# Patient Record
Sex: Female | Born: 1980 | Race: White | Hispanic: No | Marital: Single | Smoking: Never smoker
Health system: Southern US, Community
[De-identification: ages and names within clinical notes are randomized; demographics above are authoritative.]

## PROBLEM LIST (undated history)

## (undated) DIAGNOSIS — E039 Hypothyroidism, unspecified: Secondary | ICD-10-CM

## (undated) DIAGNOSIS — M1990 Unspecified osteoarthritis, unspecified site: Secondary | ICD-10-CM

## (undated) DIAGNOSIS — E119 Type 2 diabetes mellitus without complications: Secondary | ICD-10-CM

## (undated) DIAGNOSIS — N200 Calculus of kidney: Secondary | ICD-10-CM

## (undated) DIAGNOSIS — K219 Gastro-esophageal reflux disease without esophagitis: Secondary | ICD-10-CM

## (undated) DIAGNOSIS — G629 Polyneuropathy, unspecified: Secondary | ICD-10-CM

## (undated) DIAGNOSIS — E785 Hyperlipidemia, unspecified: Secondary | ICD-10-CM

## (undated) DIAGNOSIS — E079 Disorder of thyroid, unspecified: Secondary | ICD-10-CM

## (undated) DIAGNOSIS — Z87442 Personal history of urinary calculi: Secondary | ICD-10-CM

## (undated) DIAGNOSIS — G5600 Carpal tunnel syndrome, unspecified upper limb: Secondary | ICD-10-CM

## (undated) HISTORY — DX: Polyneuropathy, unspecified: G62.9

## (undated) HISTORY — DX: Hyperlipidemia, unspecified: E78.5

## (undated) HISTORY — PX: OTHER SURGICAL HISTORY: SHX169

## (undated) HISTORY — PX: BACK SURGERY: SHX140

## (undated) HISTORY — DX: Carpal tunnel syndrome, unspecified upper limb: G56.00

## (undated) HISTORY — DX: Calculus of kidney: N20.0

---

## 2007-03-25 ENCOUNTER — Ambulatory Visit (HOSPITAL_COMMUNITY): Admission: RE | Admit: 2007-03-25 | Discharge: 2007-03-25 | Payer: Self-pay | Admitting: Orthopedic Surgery

## 2007-05-23 ENCOUNTER — Ambulatory Visit (HOSPITAL_COMMUNITY): Admission: RE | Admit: 2007-05-23 | Discharge: 2007-05-24 | Payer: Self-pay | Admitting: Neurosurgery

## 2009-05-03 ENCOUNTER — Emergency Department (HOSPITAL_COMMUNITY): Admission: EM | Admit: 2009-05-03 | Discharge: 2009-05-03 | Payer: Self-pay | Admitting: Emergency Medicine

## 2009-07-25 ENCOUNTER — Encounter: Admission: RE | Admit: 2009-07-25 | Discharge: 2009-07-25 | Payer: Self-pay | Admitting: Orthopedic Surgery

## 2009-08-07 ENCOUNTER — Ambulatory Visit (HOSPITAL_COMMUNITY): Admission: RE | Admit: 2009-08-07 | Discharge: 2009-08-07 | Payer: Self-pay | Admitting: Orthopedic Surgery

## 2009-08-27 ENCOUNTER — Ambulatory Visit (HOSPITAL_BASED_OUTPATIENT_CLINIC_OR_DEPARTMENT_OTHER): Admission: RE | Admit: 2009-08-27 | Discharge: 2009-08-27 | Payer: Self-pay | Admitting: Orthopedic Surgery

## 2009-10-21 ENCOUNTER — Emergency Department (HOSPITAL_COMMUNITY): Admission: EM | Admit: 2009-10-21 | Discharge: 2009-10-22 | Payer: Self-pay | Admitting: Emergency Medicine

## 2010-11-23 ENCOUNTER — Encounter: Payer: Self-pay | Admitting: Urology

## 2011-02-02 LAB — URINALYSIS, ROUTINE W REFLEX MICROSCOPIC
Bilirubin Urine: NEGATIVE
Nitrite: NEGATIVE
Specific Gravity, Urine: 1.029 (ref 1.005–1.030)
Urobilinogen, UA: 0.2 mg/dL (ref 0.0–1.0)
pH: 5.5 (ref 5.0–8.0)

## 2011-02-02 LAB — URINE CULTURE: Culture: NO GROWTH

## 2011-02-02 LAB — URINE MICROSCOPIC-ADD ON

## 2011-02-02 LAB — GLUCOSE, CAPILLARY: Glucose-Capillary: 145 mg/dL — ABNORMAL HIGH (ref 70–99)

## 2011-02-02 LAB — POCT PREGNANCY, URINE

## 2011-02-05 LAB — BASIC METABOLIC PANEL
CO2: 31 mEq/L (ref 19–32)
Calcium: 9.7 mg/dL (ref 8.4–10.5)
GFR calc non Af Amer: >60 mL/min (ref >60)
GFR calc non Af Amer: >60 mL/min (ref >60)
Glucose, Bld: 94 mg/dL (ref 70–99)
Glucose, Bld: 210 mg/dL — ABNORMAL HIGH (ref 70–99)
Potassium: 4.8 mEq/L (ref 3.5–5.1)
Potassium: 4.6 mEq/L (ref 3.5–5.1)
Sodium: 138 mEq/L (ref 135–145)
Sodium: 138 mEq/L (ref 135–145)

## 2011-02-05 LAB — POCT HEMOGLOBIN-HEMACUE
Hemoglobin: 13.5 g/dL (ref 12.0–15.0)
Hemoglobin: 13.4 g/dL (ref 12.0–15.0)

## 2011-02-05 LAB — GLUCOSE, CAPILLARY
Glucose-Capillary: 139 mg/dL — ABNORMAL HIGH (ref 70–99)
Glucose-Capillary: 160 mg/dL — ABNORMAL HIGH (ref 70–99)

## 2011-02-09 LAB — COMPREHENSIVE METABOLIC PANEL
ALT: 37 U/L — ABNORMAL HIGH (ref 0–35)
Albumin: 3.7 g/dL (ref 3.5–5.2)
Alkaline Phosphatase: 60 U/L (ref 39–117)
BUN: 4 mg/dL — ABNORMAL LOW (ref 6–23)
Chloride: 100 mEq/L (ref 96–112)
Potassium: 3.9 mEq/L (ref 3.5–5.1)
Total Bilirubin: 0.4 mg/dL (ref 0.3–1.2)

## 2011-02-09 LAB — GLUCOSE, CAPILLARY
Glucose-Capillary: 107 mg/dL — ABNORMAL HIGH (ref 70–99)
Glucose-Capillary: 133 mg/dL — ABNORMAL HIGH (ref 70–99)

## 2011-02-09 LAB — CBC
Hemoglobin: 13.8 g/dL (ref 12.0–15.0)
RBC: 4.7 MIL/uL (ref 3.87–5.11)
RDW: 13.8 % (ref 11.5–15.5)

## 2011-02-09 LAB — DIFFERENTIAL
Basophils Relative: 0 % (ref 0–1)
Eosinophils Absolute: 0.1 10*3/uL (ref 0.0–0.7)
Eosinophils Relative: 2 % (ref 0–5)
Monocytes Absolute: 0.7 10*3/uL (ref 0.1–1.0)
Monocytes Relative: 8 % (ref 3–12)
Neutrophils Relative %: 65 % (ref 43–77)

## 2011-03-17 NOTE — Op Note (Signed)
NAMELEDONNA, DORMER             ACCOUNT NO.:  [id number]   MEDICAL RECORD NO.:  [id number]          PATIENT TYPE:  OIB   LOCATION:  3010                         FACILITY:  [facility]   PHYSICIAN:  Cristi Loron, M.D.DATE OF BIRTH:  1981-09-24   DATE OF PROCEDURE:  05/23/2007  DATE OF DISCHARGE:                               OPERATIVE REPORT   BRIEF HISTORY:  The patient is a 30 year old white female who has  suffered from back and right leg pain consistent with a right S1  radiculopathy.  She has failed medical management and was worked up with  a lumbar MRI, which demonstrated the patient had a herniated nucleus  pulposus at L5-S1 on the right.  The patient's signs, symptoms and  physical exam were consistent with a right S1 radiculopathy.  I  discussed various the various treatment options with the patient and her  family.  The patient has weighed the risks, benefits and alternatives of  surgery and decided to proceed with a right L5-S1 microdiskectomy.   PREOPERATIVE DIAGNOSES:  Right L5-S1 herniated nucleus pulposus, spinal  stenosis, lumbar radiculopathy, degenerative disk disease, lumbago.   POSTOPERATIVE DIAGNOSES:  Right L5-S1 herniated nucleus pulposus, spinal  stenosis, lumbar radiculopathy, degenerative disk disease, lumbago.   PROCEDURE:  Right L5-S1 microdiskectomy using microdissection.   SURGEON:  Cristi Loron, M.D.   ASSISTANT:  Hewitt Shorts, M.D.   ANESTHESIA:  Endotracheal estimated.   ESTIMATED BLOOD LOSS:  100 mL.   SPECIMENS:  None.   DRAINS:  None.   COMPLICATIONS:  None.   DESCRIPTION OF PROCEDURE:  The patient was brought to the operating room  by the anesthesia team.  General endotracheal anesthesia was induced.  The patient was then turned to the prone position on the Wilson frame.  Her lumbosacral region was then prepared with Betadine scrub and  Betadine solution and sterile drapes were applied.  I then injected the  area to be  incised with Marcaine with epinephrine solution and used a  scalpel to make a linear midline incision over the L5-S1 interspace.  I  used electrocautery to perform a right-sided subperiosteal dissection  exposing the right spinous process and lamina of L5 and the upper  sacrum.  We obtained intraoperative radiograph to confirm our location.   We then inserted the Versatrac retractor for exposure and then brought  the operative microscope into the field and under its magnification and  illumination completed the microdissection/decompression.  I used a high-  speed drill to perform a right L5 laminotomy.  I widened the laminotomy  with a Kerrison punch, removing the right L5-S1 ligamentum flavum.  I  then performed a foraminotomy about the right S1 nerve root.  We then  used microdissection to free up the thecal sac and the right S1 nerve  root from the epidural tissue and Dr. Newell Coral then gently retracted the  thecal sac and the S1 nerve root medially with the D'Errico retractor.  This exposed a large underlying herniated disk, which we removed in  multiple fragments using a pituitary forceps.  We then inspected the  intervertebral disk.  There was quite a bit of bulging at the disk space  and herniating through the annulus fibrosus.  We therefore incised the  annulus fibrosus with a 15-blade scalpel and performed a partial  intervertebral diskectomy using the pituitary forceps and the Epstein  and Scoville curettes.  After we were satisfied with the diskectomy, we  used the osteophyte tool to remove some spondylosis from the vertebral  endplates at L5-S1 to further decompress the nerve root.  We then  palpated along the ventral surface of the thecal sac and along the exit  route of the right S1 nerve root and noted the neural structure were  well-decompressed.  We obtained hemostasis using bipolar cautery.  We  irrigated the wound out with bacitracin solution and then removed the   Versatrac retractor and then reapproximated the patient's thoracolumbar  fascia with interrupted #1 Vicryl suture, the subcutaneous tissue with  interrupted 2-0 Vicryl suture and the skin with Steri-Strips and  Benzoin.  The wound was then coated with bacitracin ointment and a  sterile dressing applied.  The drapes were removed.  The patient was  subsequently returned to the supine position, where she was extubated by  the anesthesia team and transported to the post anesthesia care unit in  stable condition.  All sponge, instrument and needle counts were correct  at the end of this case.      Cristi Loron, M.D.  Electronically Signed     JDJ/MEDQ  D:  05/23/2007  T:  05/24/2007  Job:  [id number]

## 2011-08-17 LAB — HCG, SERUM, QUALITATIVE: Preg, Serum: NEGATIVE

## 2011-08-17 LAB — BASIC METABOLIC PANEL
BUN: 7
Calcium: 9.4
GFR calc non Af Amer: >60
Potassium: 4.3

## 2011-08-17 LAB — CBC
HCT: 41.5
Platelets: 292
WBC: 11.8 — ABNORMAL HIGH

## 2013-01-27 ENCOUNTER — Encounter (HOSPITAL_COMMUNITY): Payer: Self-pay | Admitting: *Deleted

## 2013-01-27 ENCOUNTER — Emergency Department (HOSPITAL_COMMUNITY)
Admission: EM | Admit: 2013-01-27 | Discharge: 2013-01-27 | Disposition: A | Discharge status: Home / Self Care | Payer: Medicaid Other | Attending: Emergency Medicine | Admitting: Emergency Medicine

## 2013-01-27 DIAGNOSIS — E079 Disorder of thyroid, unspecified: Secondary | ICD-10-CM | POA: Insufficient documentation

## 2013-01-27 DIAGNOSIS — I498 Other specified cardiac arrhythmias: Secondary | ICD-10-CM | POA: Insufficient documentation

## 2013-01-27 DIAGNOSIS — J029 Acute pharyngitis, unspecified: Secondary | ICD-10-CM | POA: Insufficient documentation

## 2013-01-27 DIAGNOSIS — IMO0002 Reserved for concepts with insufficient information to code with codable children: Secondary | ICD-10-CM | POA: Insufficient documentation

## 2013-01-27 DIAGNOSIS — Z79899 Other long term (current) drug therapy: Secondary | ICD-10-CM | POA: Insufficient documentation

## 2013-01-27 DIAGNOSIS — R0602 Shortness of breath: Secondary | ICD-10-CM | POA: Insufficient documentation

## 2013-01-27 DIAGNOSIS — R220 Localized swelling, mass and lump, head: Secondary | ICD-10-CM | POA: Insufficient documentation

## 2013-01-27 DIAGNOSIS — E119 Type 2 diabetes mellitus without complications: Secondary | ICD-10-CM | POA: Insufficient documentation

## 2013-01-27 DIAGNOSIS — L272 Dermatitis due to ingested food: Secondary | ICD-10-CM | POA: Insufficient documentation

## 2013-01-27 HISTORY — DX: Type 2 diabetes mellitus without complications: E11.9

## 2013-01-27 HISTORY — DX: Disorder of thyroid, unspecified: E07.9

## 2013-01-27 MED ORDER — PREDNISONE 10 MG PO TABS: 20.0000 mg | ORAL_TABLET | Freq: Every day | ORAL | Status: DC

## 2013-01-27 MED ORDER — DIPHENHYDRAMINE HCL 25 MG PO TABS: 25.0000 mg | ORAL_TABLET | Freq: Four times a day (QID) | ORAL | Status: DC

## 2013-01-27 MED ORDER — METHYLPREDNISOLONE SODIUM SUCC 125 MG IJ SOLR
125.0000 mg | Freq: Once | INTRAMUSCULAR | Status: AC
  Administered 2013-01-27: 125 mg via INTRAVENOUS
  Filled 2013-01-27: qty 2

## 2013-01-27 MED ORDER — FAMOTIDINE IN NACL 20-0.9 MG/50ML-% IV SOLN
20.0000 mg | Freq: Once | INTRAVENOUS | Status: AC
  Administered 2013-01-27: 20 mg via INTRAVENOUS
  Filled 2013-01-27: qty 50

## 2013-01-27 MED ORDER — RANITIDINE HCL 150 MG PO TABS: 150.0000 mg | ORAL_TABLET | Freq: Two times a day (BID) | ORAL | Status: DC

## 2013-01-27 MED ORDER — DIPHENHYDRAMINE HCL 50 MG/ML IJ SOLN
25.0000 mg | Freq: Once | INTRAMUSCULAR | Status: AC
  Administered 2013-01-27: 25 mg via INTRAVENOUS
  Filled 2013-01-27: qty 1

## 2013-01-27 MED ORDER — EPINEPHRINE 0.3 MG/0.3ML IJ DEVI
0.3000 mg | Freq: Once | INTRAMUSCULAR | Status: AC
  Administered 2013-01-27: 0.3 mg via INTRAMUSCULAR
  Filled 2013-01-27: qty 0.3

## 2013-01-27 NOTE — ED Notes (Signed)
Hope NP in room assessing pt at this time

## 2013-01-27 NOTE — ED Notes (Addendum)
Pt presents with rash to arms, neck and upper torso starting yesterday. Pt denies SOB but states it is hard to take a deep breath, lung sounds clear at this time, airway patent, pt speaking clearly. NAD

## 2013-01-27 NOTE — ED Notes (Addendum)
Itching rash to face and trunk,  Onset to day.  No new meds Has felt sl sob.  Has white spots in mouth and throat.

## 2013-01-27 NOTE — ED Provider Notes (Signed)
History     CSN: [id number]  Arrival date & time 01/27/13  2038   First MD Initiated Contact with Patient 01/27/13 2143      Chief Complaint  Patient presents with  . Rash    (Consider location/radiation/quality/duration/timing/severity/associated sxs/prior treatment) Patient is a 32 y.o. female presenting with rash. The history is provided by the patient and a parent. History limited by: patient with mild Down's syndrome.  Rash Location:  Full body Severity:  Moderate Onset quality:  Gradual Duration:  8 hours Timing:  Constant Progression:  Worsening Chronicity:  New Context: food   Relieved by:  Nothing Associated symptoms: shortness of breath and sore throat   Associated symptoms: no abdominal pain, no fever, no headaches, no nausea and not vomiting    Patient ate strawberries and then began to notice a rash and felt like her lips were swelling. Also noted redness of throat and bumps on throat. Began feeling like it was hard to get a deep breath.   Past Medical History  Diagnosis Date  . Diabetes mellitus without complication   . Thyroid disease     Past Surgical History  Procedure Laterality Date  . Tubes in ears    . Back surgery      History reviewed. No pertinent family history.  History  Substance Use Topics  . Smoking status: Never Smoker   . Smokeless tobacco: Not on file  . Alcohol Use: No    OB History   Grav Para Term Preterm Abortions TAB SAB Ect Mult Living                  Review of Systems  Constitutional: Negative for fever and chills.  HENT: Positive for sore throat and facial swelling. Negative for trouble swallowing, neck pain and voice change.   Respiratory: Positive for shortness of breath.   Gastrointestinal: Negative for nausea, vomiting and abdominal pain.  Skin: Positive for rash.  Neurological: Negative for headaches.  Psychiatric/Behavioral: The patient is not nervous/anxious.     Allergies  Penicillins  Home  Medications   Current Outpatient Rx  Name  Route  Sig  Dispense  Refill  . acetaminophen (TYLENOL) 500 MG tablet   Oral   Take 500 mg by mouth daily as needed for pain.         . fluticasone (FLONASE) 50 MCG/ACT nasal spray   Nasal   Place 2 sprays into the nose daily.         Marland Kitchen glimepiride (AMARYL) 2 MG tablet   Oral   Take 2 mg by mouth 2 (two) times daily.         Marland Kitchen levothyroxine (SYNTHROID, LEVOTHROID) 137 MCG tablet   Oral   Take 137 mcg by mouth daily.           BP 134/68  Pulse 142  Temp(Src) 101.4 F (38.6 C) (Oral)  Wt 328 lb (148.78 kg)  SpO2 92%  LMP 01/27/2013  Physical Exam  Nursing note and vitals reviewed. Constitutional: No distress.  HENT:  Mouth/Throat: Uvula is midline. Posterior oropharyngeal erythema present.  There are small lesions noted on the posterior pharynx. Minimal edema of throat and lips.  No difficulty swallowing or talking.  Eyes: EOM are normal.  Neck: Neck supple.  Cardiovascular: Bradycardia present.   Pulmonary/Chest: No respiratory distress. She has no wheezes. She has no rales.  Slightly decreased breath sounds lung bases.  Musculoskeletal:  See skin exam  Skin: Rash noted.  There are  raised red areas noted on the face, neck, arms, trunk and legs. Patient c/o itching  Psychiatric: She has a normal mood and affect.   Assessment: 32 y.o. female with allergic reaction  Plan:  Solumedrol 125 mg, Benadryl 25 mg, Pepcid 20 mg IV   Observe   ED Course: re evaluation @ 23:15 and Dr. Ignacia Palma in to evaluate as well Patient feeling better, lungs clear, lips without edema or erythema. Throat without edema. Rash improved, no itching, no difficulty breathing.   Procedures (including critical care time)   MDM  I have reviewed this patient's vital signs, nurses notes, will recheck vital signs to be sure heart rate is normal. Discussed with patient and family plan of care. They voice understanding.  Patient discharged home with  Rx for Zantac, Benadryl and Prednisone    Medication List    TAKE these medications       diphenhydrAMINE 25 MG tablet  Commonly known as:  BENADRYL  Take 1 tablet (25 mg total) by mouth every 6 (six) hours.     predniSONE 10 MG tablet  Commonly known as:  DELTASONE  Take 2 tablets (20 mg total) by mouth daily.     ranitidine 150 MG tablet  Commonly known as:  ZANTAC  Take 1 tablet (150 mg total) by mouth 2 (two) times daily.      ASK your doctor about these medications       acetaminophen 500 MG tablet  Commonly known as:  TYLENOL  Take 500 mg by mouth daily as needed for pain.     fluticasone 50 MCG/ACT nasal spray  Commonly known as:  FLONASE  Place 2 sprays into the nose daily.     glimepiride 2 MG tablet  Commonly known as:  AMARYL  Take 2 mg by mouth 2 (two) times daily.     levothyroxine 137 MCG tablet  Commonly known as:  SYNTHROID, LEVOTHROID  Take 137 mcg by mouth daily.               Janne Napoleon, [place] 01/27/13 [phone]

## 2013-01-28 NOTE — ED Provider Notes (Signed)
Medical screening examination/treatment/procedure(s) were conducted as a shared visit with non-physician practitioner(s) and myself.  I personally evaluated the patient during the encounter Ate strawberry pop tart and developed urticarial rash.  Given Epinephrine, Solumedrol, Pepcid, and Benadryl, with resolution.  Advised she is allergic to strawberries.  Carleene Cooper III, MD 01/28/13 1126

## 2013-10-02 ENCOUNTER — Inpatient Hospital Stay (HOSPITAL_COMMUNITY)
Admission: EM | Admit: 2013-10-02 | Discharge: 2013-10-09 | DRG: 480 | Disposition: A | Discharge status: Rehabilitation Facility | Payer: Medicaid Other | Attending: Internal Medicine | Admitting: Internal Medicine

## 2013-10-02 DIAGNOSIS — Y998 Other external cause status: Secondary | ICD-10-CM

## 2013-10-02 DIAGNOSIS — R339 Retention of urine, unspecified: Secondary | ICD-10-CM | POA: Diagnosis present

## 2013-10-02 DIAGNOSIS — E662 Morbid (severe) obesity with alveolar hypoventilation: Secondary | ICD-10-CM | POA: Diagnosis present

## 2013-10-02 DIAGNOSIS — I498 Other specified cardiac arrhythmias: Secondary | ICD-10-CM | POA: Diagnosis not present

## 2013-10-02 DIAGNOSIS — E872 Acidosis, unspecified: Secondary | ICD-10-CM | POA: Diagnosis not present

## 2013-10-02 DIAGNOSIS — S7292XA Unspecified fracture of left femur, initial encounter for closed fracture: Secondary | ICD-10-CM

## 2013-10-02 DIAGNOSIS — Z6841 Body Mass Index (BMI) 40.0 and over, adult: Secondary | ICD-10-CM

## 2013-10-02 DIAGNOSIS — R0902 Hypoxemia: Secondary | ICD-10-CM

## 2013-10-02 DIAGNOSIS — Y92009 Unspecified place in unspecified non-institutional (private) residence as the place of occurrence of the external cause: Secondary | ICD-10-CM

## 2013-10-02 DIAGNOSIS — N179 Acute kidney failure, unspecified: Secondary | ICD-10-CM | POA: Diagnosis not present

## 2013-10-02 DIAGNOSIS — S72332A Displaced oblique fracture of shaft of left femur, initial encounter for closed fracture: Secondary | ICD-10-CM

## 2013-10-02 DIAGNOSIS — S72309A Unspecified fracture of shaft of unspecified femur, initial encounter for closed fracture: Principal | ICD-10-CM | POA: Diagnosis present

## 2013-10-02 DIAGNOSIS — E871 Hypo-osmolality and hyponatremia: Secondary | ICD-10-CM | POA: Diagnosis not present

## 2013-10-02 DIAGNOSIS — G4733 Obstructive sleep apnea (adult) (pediatric): Secondary | ICD-10-CM | POA: Diagnosis present

## 2013-10-02 DIAGNOSIS — R000 Tachycardia, unspecified: Secondary | ICD-10-CM

## 2013-10-02 DIAGNOSIS — W19XXXA Unspecified fall, initial encounter: Secondary | ICD-10-CM | POA: Diagnosis present

## 2013-10-02 DIAGNOSIS — E039 Hypothyroidism, unspecified: Secondary | ICD-10-CM | POA: Diagnosis present

## 2013-10-02 DIAGNOSIS — R338 Other retention of urine: Secondary | ICD-10-CM

## 2013-10-02 DIAGNOSIS — K219 Gastro-esophageal reflux disease without esophagitis: Secondary | ICD-10-CM | POA: Diagnosis present

## 2013-10-02 DIAGNOSIS — J9620 Acute and chronic respiratory failure, unspecified whether with hypoxia or hypercapnia: Secondary | ICD-10-CM | POA: Insufficient documentation

## 2013-10-02 DIAGNOSIS — E119 Type 2 diabetes mellitus without complications: Secondary | ICD-10-CM | POA: Diagnosis present

## 2013-10-02 HISTORY — DX: Gastro-esophageal reflux disease without esophagitis: K21.9

## 2013-10-03 ENCOUNTER — Inpatient Hospital Stay (HOSPITAL_COMMUNITY): Payer: Medicaid Other

## 2013-10-03 ENCOUNTER — Encounter (HOSPITAL_COMMUNITY): Payer: Medicaid Other | Admitting: Anesthesiology

## 2013-10-03 ENCOUNTER — Emergency Department (HOSPITAL_COMMUNITY): Payer: Medicaid Other

## 2013-10-03 ENCOUNTER — Inpatient Hospital Stay (HOSPITAL_COMMUNITY): Payer: Medicaid Other | Admitting: Anesthesiology

## 2013-10-03 ENCOUNTER — Encounter (HOSPITAL_COMMUNITY): Payer: Self-pay | Admitting: Emergency Medicine

## 2013-10-03 ENCOUNTER — Encounter (HOSPITAL_COMMUNITY)
Admission: EM | Disposition: A | Discharge status: Rehabilitation Facility | Payer: Self-pay | Source: Home / Self Care | Attending: Internal Medicine

## 2013-10-03 DIAGNOSIS — R0902 Hypoxemia: Secondary | ICD-10-CM

## 2013-10-03 DIAGNOSIS — E039 Hypothyroidism, unspecified: Secondary | ICD-10-CM | POA: Diagnosis present

## 2013-10-03 DIAGNOSIS — S7290XA Unspecified fracture of unspecified femur, initial encounter for closed fracture: Secondary | ICD-10-CM | POA: Insufficient documentation

## 2013-10-03 DIAGNOSIS — E119 Type 2 diabetes mellitus without complications: Secondary | ICD-10-CM | POA: Diagnosis present

## 2013-10-03 DIAGNOSIS — S72332A Displaced oblique fracture of shaft of left femur, initial encounter for closed fracture: Secondary | ICD-10-CM

## 2013-10-03 DIAGNOSIS — R000 Tachycardia, unspecified: Secondary | ICD-10-CM

## 2013-10-03 HISTORY — PX: FEMUR IM NAIL: SHX1597

## 2013-10-03 LAB — COMPREHENSIVE METABOLIC PANEL
ALT: 38 U/L — ABNORMAL HIGH (ref 0–35)
Alkaline Phosphatase: 86 U/L (ref 39–117)
BUN: 13 mg/dL (ref 6–23)
Chloride: 96 mEq/L (ref 96–112)
GFR calc Af Amer: >90 mL/min (ref >90)
Glucose, Bld: 353 mg/dL — ABNORMAL HIGH (ref 70–99)
Potassium: 4.7 mEq/L (ref 3.5–5.1)
Sodium: 137 mEq/L (ref 135–145)
Total Bilirubin: 0.4 mg/dL (ref 0.3–1.2)

## 2013-10-03 LAB — CBC WITH DIFFERENTIAL/PLATELET
Hemoglobin: 14.3 g/dL (ref 12.0–15.0)
Lymphocytes Relative: 10 % — ABNORMAL LOW (ref 12–46)
Lymphs Abs: 1.7 10*3/uL (ref 0.7–4.0)
MCH: 30.6 pg (ref 26.0–34.0)
Monocytes Relative: 4 % (ref 3–12)
Neutro Abs: 14 10*3/uL — ABNORMAL HIGH (ref 1.7–7.7)
Neutrophils Relative %: 85 % — ABNORMAL HIGH (ref 43–77)
RBC: 4.67 MIL/uL (ref 3.87–5.11)
WBC: 16.5 10*3/uL — ABNORMAL HIGH (ref 4.0–10.5)

## 2013-10-03 LAB — URINALYSIS, ROUTINE W REFLEX MICROSCOPIC
Bilirubin Urine: NEGATIVE
Glucose, UA: >1000 mg/dL — AB
Ketones, ur: 15 mg/dL — AB
Specific Gravity, Urine: 1.01 (ref 1.005–1.030)
pH: 6 (ref 5.0–8.0)

## 2013-10-03 LAB — POCT I-STAT 3, VENOUS BLOOD GAS (G3P V)
Acid-Base Excess: 3 mmol/L — ABNORMAL HIGH (ref 0.0–2.0)
Bicarbonate: 29.5 mEq/L — ABNORMAL HIGH (ref 20.0–24.0)
Patient temperature: 100.2
TCO2: 31 mmol/L (ref 0–100)
pCO2, Ven: 53.1 mmHg — ABNORMAL HIGH (ref 45.0–50.0)

## 2013-10-03 LAB — ABO/RH: ABO/RH(D): O POS

## 2013-10-03 LAB — TYPE AND SCREEN
ABO/RH(D): O POS
Antibody Screen: NEGATIVE

## 2013-10-03 LAB — GLUCOSE, CAPILLARY
Glucose-Capillary: 298 mg/dL — ABNORMAL HIGH (ref 70–99)
Glucose-Capillary: 181 mg/dL — ABNORMAL HIGH (ref 70–99)

## 2013-10-03 LAB — URINE MICROSCOPIC-ADD ON

## 2013-10-03 LAB — PRO B NATRIURETIC PEPTIDE: Pro B Natriuretic peptide (BNP): 17.7 pg/mL (ref 0–125)

## 2013-10-03 SURGERY — INSERTION, INTRAMEDULLARY ROD, FEMUR, RETROGRADE
Anesthesia: General | Site: Leg Upper | Laterality: Left

## 2013-10-03 MED ORDER — METOCLOPRAMIDE HCL 5 MG PO TABS: 5.0000 mg | ORAL_TABLET | Freq: Three times a day (TID) | ORAL | Status: DC | PRN

## 2013-10-03 MED ORDER — HYDROMORPHONE HCL PF 1 MG/ML IJ SOLN: 1.0000 mg | INTRAMUSCULAR | Status: DC | PRN

## 2013-10-03 MED ORDER — HYDROMORPHONE HCL PF 1 MG/ML IJ SOLN
1.0000 mg | Freq: Once | INTRAMUSCULAR | Status: AC | PRN
  Administered 2013-10-03: 1 mg via INTRAVENOUS
  Filled 2013-10-03: qty 1

## 2013-10-03 MED ORDER — HYDROCODONE-ACETAMINOPHEN 5-325 MG PO TABS: 1.0000 | ORAL_TABLET | Freq: Four times a day (QID) | ORAL | Status: DC | PRN

## 2013-10-03 MED ORDER — METHOCARBAMOL 500 MG PO TABS
ORAL_TABLET | ORAL | Status: AC
  Filled 2013-10-03: qty 1

## 2013-10-03 MED ORDER — ONDANSETRON HCL 4 MG/2ML IJ SOLN
4.0000 mg | Freq: Four times a day (QID) | INTRAMUSCULAR | Status: DC | PRN
  Administered 2013-10-03 – 2013-10-06 (×2): 4 mg via INTRAVENOUS
  Filled 2013-10-03 (×2): qty 2

## 2013-10-03 MED ORDER — IOHEXOL 350 MG/ML SOLN
100.0000 mL | Freq: Once | INTRAVENOUS | Status: AC | PRN
  Administered 2013-10-03: 80 mL via INTRAVENOUS

## 2013-10-03 MED ORDER — CIPROFLOXACIN HCL 500 MG PO TABS
500.0000 mg | ORAL_TABLET | Freq: Once | ORAL | Status: AC
  Administered 2013-10-03: 500 mg via ORAL
  Filled 2013-10-03: qty 1

## 2013-10-03 MED ORDER — OXYCODONE HCL 5 MG/5ML PO SOLN: 5.0000 mg | Freq: Once | ORAL | Status: AC | PRN

## 2013-10-03 MED ORDER — POLYETHYLENE GLYCOL 3350 17 G PO PACK: 17.0000 g | PACK | Freq: Every day | ORAL | Status: DC | PRN

## 2013-10-03 MED ORDER — ONDANSETRON HCL 4 MG/2ML IJ SOLN
INTRAMUSCULAR | Status: DC | PRN
  Administered 2013-10-03: 4 mg via INTRAVENOUS

## 2013-10-03 MED ORDER — MENTHOL 3 MG MT LOZG: 1.0000 | LOZENGE | OROMUCOSAL | Status: DC | PRN

## 2013-10-03 MED ORDER — FENTANYL CITRATE 0.05 MG/ML IJ SOLN
INTRAMUSCULAR | Status: AC
  Filled 2013-10-03: qty 2

## 2013-10-03 MED ORDER — GLYCOPYRROLATE 0.2 MG/ML IJ SOLN
INTRAMUSCULAR | Status: DC | PRN
  Administered 2013-10-03: 0.6 mg via INTRAVENOUS

## 2013-10-03 MED ORDER — METHOCARBAMOL 100 MG/ML IJ SOLN
500.0000 mg | Freq: Four times a day (QID) | INTRAVENOUS | Status: DC | PRN
  Administered 2013-10-03: 500 mg via INTRAVENOUS
  Filled 2013-10-03 (×2): qty 5

## 2013-10-03 MED ORDER — SODIUM CHLORIDE 0.9 % IV SOLN
INTRAVENOUS | Status: DC
  Administered 2013-10-03: 08:00:00 via INTRAVENOUS

## 2013-10-03 MED ORDER — SODIUM CHLORIDE 0.45 % IV SOLN
INTRAVENOUS | Status: DC
  Administered 2013-10-03 – 2013-10-05 (×3): via INTRAVENOUS

## 2013-10-03 MED ORDER — FENTANYL CITRATE 0.05 MG/ML IJ SOLN
INTRAMUSCULAR | Status: DC | PRN
  Administered 2013-10-03 (×11): 50 ug via INTRAVENOUS

## 2013-10-03 MED ORDER — BUPIVACAINE HCL (PF) 0.25 % IJ SOLN
INTRAMUSCULAR | Status: AC
  Filled 2013-10-03: qty 30

## 2013-10-03 MED ORDER — DEXTROSE 5 % IV SOLN
3.0000 g | INTRAVENOUS | Status: DC | PRN
  Administered 2013-10-03: 3 g via INTRAVENOUS

## 2013-10-03 MED ORDER — FENTANYL CITRATE 0.05 MG/ML IJ SOLN
25.0000 ug | INTRAMUSCULAR | Status: DC | PRN
  Administered 2013-10-03 (×2): 50 ug via INTRAVENOUS
  Administered 2013-10-03 (×2): 25 ug via INTRAVENOUS

## 2013-10-03 MED ORDER — 0.9 % SODIUM CHLORIDE (POUR BTL) OPTIME
TOPICAL | Status: DC | PRN
  Administered 2013-10-03: 1000 mL

## 2013-10-03 MED ORDER — METOCLOPRAMIDE HCL 5 MG/ML IJ SOLN
5.0000 mg | Freq: Three times a day (TID) | INTRAMUSCULAR | Status: DC | PRN
  Administered 2013-10-05: 5 mg via INTRAVENOUS
  Filled 2013-10-03: qty 2

## 2013-10-03 MED ORDER — OXYCODONE HCL 5 MG PO TABS
5.0000 mg | ORAL_TABLET | ORAL | Status: DC | PRN
  Administered 2013-10-04 – 2013-10-05 (×8): 10 mg via ORAL
  Filled 2013-10-03 (×8): qty 2

## 2013-10-03 MED ORDER — INFLUENZA VAC SPLIT QUAD 0.5 ML IM SUSP: 0.5000 mL | INTRAMUSCULAR | Status: DC

## 2013-10-03 MED ORDER — INSULIN ASPART 100 UNIT/ML ~~LOC~~ SOLN
SUBCUTANEOUS | Status: AC
  Filled 2013-10-03: qty 5

## 2013-10-03 MED ORDER — HYDROMORPHONE HCL PF 1 MG/ML IJ SOLN: 0.5000 mg | INTRAMUSCULAR | Status: DC | PRN

## 2013-10-03 MED ORDER — DEXTROSE 5 % IV SOLN
3.0000 g | Freq: Once | INTRAVENOUS | Status: DC
  Filled 2013-10-03: qty 3000

## 2013-10-03 MED ORDER — SENNOSIDES-DOCUSATE SODIUM 8.6-50 MG PO TABS
2.0000 | ORAL_TABLET | Freq: Every evening | ORAL | Status: DC | PRN
  Filled 2013-10-03: qty 2

## 2013-10-03 MED ORDER — PROPOFOL 10 MG/ML IV BOLUS
INTRAVENOUS | Status: DC | PRN
  Administered 2013-10-03: 200 mg via INTRAVENOUS

## 2013-10-03 MED ORDER — HYDROMORPHONE HCL PF 1 MG/ML IJ SOLN
1.0000 mg | Freq: Once | INTRAMUSCULAR | Status: AC
  Administered 2013-10-03: 1 mg via INTRAVENOUS
  Filled 2013-10-03: qty 1

## 2013-10-03 MED ORDER — ENOXAPARIN SODIUM 40 MG/0.4ML ~~LOC~~ SOLN
40.0000 mg | Freq: Every day | SUBCUTANEOUS | Status: DC
  Filled 2013-10-03: qty 0.4

## 2013-10-03 MED ORDER — SODIUM CHLORIDE 0.9 % IV BOLUS (SEPSIS)
1000.0000 mL | Freq: Once | INTRAVENOUS | Status: AC
  Administered 2013-10-03: 1000 mL via INTRAVENOUS

## 2013-10-03 MED ORDER — FLEET ENEMA 7-19 GM/118ML RE ENEM
1.0000 | ENEMA | Freq: Once | RECTAL | Status: AC | PRN
  Filled 2013-10-03: qty 1

## 2013-10-03 MED ORDER — METHOCARBAMOL 500 MG PO TABS
500.0000 mg | ORAL_TABLET | Freq: Four times a day (QID) | ORAL | Status: DC | PRN
  Administered 2013-10-03 – 2013-10-09 (×11): 500 mg via ORAL
  Filled 2013-10-03 (×11): qty 1

## 2013-10-03 MED ORDER — HYDROMORPHONE HCL PF 1 MG/ML IJ SOLN
0.5000 mg | INTRAMUSCULAR | Status: DC | PRN
  Administered 2013-10-03 (×3): 0.5 mg via INTRAVENOUS
  Filled 2013-10-03 (×3): qty 1

## 2013-10-03 MED ORDER — LEVOTHYROXINE SODIUM 137 MCG PO TABS
137.0000 ug | ORAL_TABLET | Freq: Every day | ORAL | Status: DC
  Filled 2013-10-03 (×2): qty 1

## 2013-10-03 MED ORDER — SUCCINYLCHOLINE CHLORIDE 20 MG/ML IJ SOLN
INTRAMUSCULAR | Status: DC | PRN
  Administered 2013-10-03: 100 mg via INTRAVENOUS

## 2013-10-03 MED ORDER — INSULIN ASPART 100 UNIT/ML ~~LOC~~ SOLN: 0.0000 [IU] | Freq: Every day | SUBCUTANEOUS | Status: DC

## 2013-10-03 MED ORDER — LIDOCAINE HCL (CARDIAC) 20 MG/ML IV SOLN
INTRAVENOUS | Status: DC | PRN
  Administered 2013-10-03: 60 mg via INTRAVENOUS

## 2013-10-03 MED ORDER — FLUTICASONE PROPIONATE 50 MCG/ACT NA SUSP
2.0000 | Freq: Every day | NASAL | Status: DC
  Administered 2013-10-03: 2 via NASAL
  Filled 2013-10-03: qty 16

## 2013-10-03 MED ORDER — MORPHINE SULFATE 2 MG/ML IJ SOLN
0.5000 mg | INTRAMUSCULAR | Status: DC | PRN
  Filled 2013-10-03: qty 1

## 2013-10-03 MED ORDER — ONDANSETRON HCL 4 MG PO TABS
4.0000 mg | ORAL_TABLET | Freq: Four times a day (QID) | ORAL | Status: DC | PRN
  Administered 2013-10-05: 4 mg via ORAL
  Filled 2013-10-03: qty 1

## 2013-10-03 MED ORDER — TAMSULOSIN HCL 0.4 MG PO CAPS
0.4000 mg | ORAL_CAPSULE | Freq: Every day | ORAL | Status: DC
  Filled 2013-10-03: qty 1

## 2013-10-03 MED ORDER — ASPIRIN EC 325 MG PO TBEC
325.0000 mg | DELAYED_RELEASE_TABLET | Freq: Every day | ORAL | Status: DC
  Administered 2013-10-04 – 2013-10-09 (×6): 325 mg via ORAL
  Filled 2013-10-03 (×7): qty 1

## 2013-10-03 MED ORDER — MIDAZOLAM HCL 5 MG/5ML IJ SOLN
INTRAMUSCULAR | Status: DC | PRN
  Administered 2013-10-03: 2 mg via INTRAVENOUS

## 2013-10-03 MED ORDER — BUPIVACAINE HCL 0.25 % IJ SOLN
INTRAMUSCULAR | Status: DC | PRN
  Administered 2013-10-03: 10 mL

## 2013-10-03 MED ORDER — OXYCODONE HCL 5 MG PO TABS
ORAL_TABLET | ORAL | Status: AC
  Filled 2013-10-03: qty 1

## 2013-10-03 MED ORDER — HYDROCODONE-ACETAMINOPHEN 5-325 MG PO TABS
1.0000 | ORAL_TABLET | Freq: Four times a day (QID) | ORAL | Status: DC | PRN
  Administered 2013-10-05: 1 via ORAL
  Administered 2013-10-06 (×3): 2 via ORAL
  Administered 2013-10-07: 1 via ORAL
  Administered 2013-10-07: 2 via ORAL
  Administered 2013-10-07: 1 via ORAL
  Administered 2013-10-08 – 2013-10-09 (×6): 2 via ORAL
  Filled 2013-10-03 (×5): qty 2
  Filled 2013-10-03 (×3): qty 1
  Filled 2013-10-03: qty 2
  Filled 2013-10-03: qty 1
  Filled 2013-10-03 (×4): qty 2

## 2013-10-03 MED ORDER — NEOSTIGMINE METHYLSULFATE 1 MG/ML IJ SOLN
INTRAMUSCULAR | Status: DC | PRN
  Administered 2013-10-03: 4 mg via INTRAVENOUS

## 2013-10-03 MED ORDER — PHENOL 1.4 % MT LIQD: 1.0000 | OROMUCOSAL | Status: DC | PRN

## 2013-10-03 MED ORDER — METHOCARBAMOL 100 MG/ML IJ SOLN
500.0000 mg | Freq: Four times a day (QID) | INTRAVENOUS | Status: DC | PRN
  Filled 2013-10-03: qty 5

## 2013-10-03 MED ORDER — OXYCODONE HCL 5 MG PO TABS
5.0000 mg | ORAL_TABLET | Freq: Once | ORAL | Status: AC | PRN
  Administered 2013-10-03: 5 mg via ORAL

## 2013-10-03 MED ORDER — DOCUSATE SODIUM 100 MG PO CAPS
100.0000 mg | ORAL_CAPSULE | Freq: Two times a day (BID) | ORAL | Status: DC
  Administered 2013-10-04 – 2013-10-09 (×9): 100 mg via ORAL
  Filled 2013-10-03 (×9): qty 1

## 2013-10-03 MED ORDER — MORPHINE SULFATE 2 MG/ML IJ SOLN
2.0000 mg | INTRAMUSCULAR | Status: DC | PRN
  Administered 2013-10-03 – 2013-10-08 (×5): 2 mg via INTRAVENOUS
  Filled 2013-10-03 (×5): qty 1

## 2013-10-03 MED ORDER — LACTATED RINGERS IV SOLN
INTRAVENOUS | Status: DC | PRN
  Administered 2013-10-03 (×2): via INTRAVENOUS

## 2013-10-03 MED ORDER — ONDANSETRON HCL 4 MG/2ML IJ SOLN: 4.0000 mg | Freq: Four times a day (QID) | INTRAMUSCULAR | Status: DC | PRN

## 2013-10-03 MED ORDER — BISACODYL 10 MG RE SUPP: 10.0000 mg | Freq: Every day | RECTAL | Status: DC | PRN

## 2013-10-03 MED ORDER — ROCURONIUM BROMIDE 100 MG/10ML IV SOLN
INTRAVENOUS | Status: DC | PRN
  Administered 2013-10-03: 10 mg via INTRAVENOUS
  Administered 2013-10-03: 30 mg via INTRAVENOUS
  Administered 2013-10-03: 10 mg via INTRAVENOUS

## 2013-10-03 MED ORDER — ACETAMINOPHEN 325 MG PO TABS
650.0000 mg | ORAL_TABLET | Freq: Four times a day (QID) | ORAL | Status: DC | PRN
  Administered 2013-10-05 (×2): 650 mg via ORAL
  Filled 2013-10-03 (×2): qty 2

## 2013-10-03 MED ORDER — ACETAMINOPHEN 650 MG RE SUPP: 650.0000 mg | Freq: Four times a day (QID) | RECTAL | Status: DC | PRN

## 2013-10-03 MED ORDER — INSULIN ASPART 100 UNIT/ML ~~LOC~~ SOLN
0.0000 [IU] | Freq: Four times a day (QID) | SUBCUTANEOUS | Status: DC
  Administered 2013-10-03: 8 [IU] via SUBCUTANEOUS
  Administered 2013-10-03: 5 [IU] via SUBCUTANEOUS

## 2013-10-03 MED ORDER — PNEUMOCOCCAL VAC POLYVALENT 25 MCG/0.5ML IJ INJ: 0.5000 mL | INJECTION | INTRAMUSCULAR | Status: DC

## 2013-10-03 SURGICAL SUPPLY — 73 items
BANDAGE ELASTIC 4 VELCRO ST LF (GAUZE/BANDAGES/DRESSINGS) IMPLANT
BANDAGE ELASTIC 6 VELCRO ST LF (GAUZE/BANDAGES/DRESSINGS) IMPLANT
BANDAGE ESMARK 6X9 LF (GAUZE/BANDAGES/DRESSINGS) IMPLANT
BANDAGE GAUZE ELAST BULKY 4 IN (GAUZE/BANDAGES/DRESSINGS) IMPLANT
BENZOIN TINCTURE PRP APPL 2/3 (GAUZE/BANDAGES/DRESSINGS) ×2 IMPLANT
BIT DRILL CALIBRATED 4.3MMX365 (DRILL) ×1 IMPLANT
BIT DRILL CROWE PNT TWST 4.5MM (DRILL) ×1 IMPLANT
BLADE SURG 15 STRL LF DISP TIS (BLADE) IMPLANT
BLADE SURG 15 STRL SS (BLADE)
BLADE SURG ROTATE 9660 (MISCELLANEOUS) IMPLANT
BNDG COHESIVE 6X5 TAN STRL LF (GAUZE/BANDAGES/DRESSINGS) IMPLANT
BNDG ESMARK 6X9 LF (GAUZE/BANDAGES/DRESSINGS)
CLOTH BEACON ORANGE TIMEOUT ST (SAFETY) IMPLANT
COVER SURGICAL LIGHT HANDLE (MISCELLANEOUS) ×4 IMPLANT
CUFF TOURNIQUET SINGLE 34IN LL (TOURNIQUET CUFF) IMPLANT
CUFF TOURNIQUET SINGLE 44IN (TOURNIQUET CUFF) IMPLANT
DRAPE C-ARM 42X72 X-RAY (DRAPES) ×2 IMPLANT
DRAPE C-ARMOR (DRAPES) ×2 IMPLANT
DRAPE ORTHO SPLIT 77X108 STRL (DRAPES) ×3
DRAPE PROXIMA HALF (DRAPES) ×4 IMPLANT
DRAPE SURG ORHT 6 SPLT 77X108 (DRAPES) ×3 IMPLANT
DRAPE U-SHAPE 47X51 STRL (DRAPES) IMPLANT
DRILL CALIBRATED 4.3MMX365 (DRILL) ×2
DRILL CROWE POINT TWIST 4.5MM (DRILL) ×2
DURAPREP 26ML APPLICATOR (WOUND CARE) ×2 IMPLANT
ELECT REM PT RETURN 9FT ADLT (ELECTROSURGICAL) ×2
ELECTRODE REM PT RTRN 9FT ADLT (ELECTROSURGICAL) ×1 IMPLANT
FACESHIELD LNG OPTICON STERILE (SAFETY) IMPLANT
GAUZE XEROFORM 5X9 LF (GAUZE/BANDAGES/DRESSINGS) ×2 IMPLANT
GLOVE BIO SURGEON STRL SZ 6.5 (GLOVE) ×2 IMPLANT
GLOVE BIOGEL PI IND STRL 6.5 (GLOVE) ×1 IMPLANT
GLOVE BIOGEL PI IND STRL 7.0 (GLOVE) ×1 IMPLANT
GLOVE BIOGEL PI IND STRL 7.5 (GLOVE) IMPLANT
GLOVE BIOGEL PI IND STRL 8 (GLOVE) ×2 IMPLANT
GLOVE BIOGEL PI INDICATOR 6.5 (GLOVE) ×1
GLOVE BIOGEL PI INDICATOR 7.0 (GLOVE) ×1
GLOVE BIOGEL PI INDICATOR 7.5 (GLOVE)
GLOVE BIOGEL PI INDICATOR 8 (GLOVE) ×2
GLOVE ECLIPSE 7.0 STRL STRAW (GLOVE) IMPLANT
GLOVE ORTHO TXT STRL SZ7.5 (GLOVE) ×4 IMPLANT
GLOVE SURG SS PI 6.0 STRL IVOR (GLOVE) ×2 IMPLANT
GOWN PREVENTION PLUS LG XLONG (DISPOSABLE) ×2 IMPLANT
GOWN PREVENTION PLUS XLARGE (GOWN DISPOSABLE) ×4 IMPLANT
GOWN STRL NON-REIN LRG LVL3 (GOWN DISPOSABLE) ×2 IMPLANT
GUIDEPIN 3.2X17.5 THRD DISP (PIN) ×2 IMPLANT
GUIDEWIRE BEAD TIP (WIRE) ×2 IMPLANT
KIT BASIN OR (CUSTOM PROCEDURE TRAY) ×2 IMPLANT
KIT ROOM TURNOVER OR (KITS) ×2 IMPLANT
MANIFOLD NEPTUNE II (INSTRUMENTS) IMPLANT
NAIL FEM RETRO 9X320 (Nail) ×2 IMPLANT
NS IRRIG 1000ML POUR BTL (IV SOLUTION) ×2 IMPLANT
PACK GENERAL/GYN (CUSTOM PROCEDURE TRAY) ×2 IMPLANT
PAD ARMBOARD 7.5X6 YLW CONV (MISCELLANEOUS) ×4 IMPLANT
SCREW CORT TI DBL LEAD 5X34 (Screw) ×2 IMPLANT
SCREW CORT TI DBL LEAD 5X48 (Screw) ×2 IMPLANT
SCREW CORT TI DBL LEAD 5X70 (Screw) ×2 IMPLANT
SCREW CORT TI DBL LEAD 5X75 (Screw) ×2 IMPLANT
SCREW CORT TI DBLE LEAD 5X54 (Screw) ×2 IMPLANT
SPONGE GAUZE 4X4 12PLY (GAUZE/BANDAGES/DRESSINGS) ×2 IMPLANT
STAPLER VISISTAT 35W (STAPLE) IMPLANT
STOCKINETTE IMPERVIOUS LG (DRAPES) ×2 IMPLANT
STRIP CLOSURE SKIN 1/2X4 (GAUZE/BANDAGES/DRESSINGS) ×2 IMPLANT
SUT ETHILON 4 0 PS 2 18 (SUTURE) ×2 IMPLANT
SUT VIC AB 0 CT1 27 (SUTURE) ×1
SUT VIC AB 0 CT1 27XBRD ANBCTR (SUTURE) ×1 IMPLANT
SUT VIC AB 2-0 CT1 27 (SUTURE) ×1
SUT VIC AB 2-0 CT1 TAPERPNT 27 (SUTURE) ×1 IMPLANT
SUT VIC AB 4-0 PS2 27 (SUTURE) ×2 IMPLANT
TAPE CLOTH SURG 4X10 WHT LF (GAUZE/BANDAGES/DRESSINGS) ×2 IMPLANT
TOWEL OR 17X24 6PK STRL BLUE (TOWEL DISPOSABLE) ×2 IMPLANT
TOWEL OR 17X26 10 PK STRL BLUE (TOWEL DISPOSABLE) ×2 IMPLANT
TRAY FOLEY CATH 16FRSI W/METER (SET/KITS/TRAYS/PACK) IMPLANT
WATER STERILE IRR 1000ML POUR (IV SOLUTION) ×2 IMPLANT

## 2013-10-03 NOTE — ED Provider Notes (Signed)
CSN: [id number]     Arrival date & time 10/02/13  2353 History   First MD Initiated Contact with Patient 10/02/13 2356     Chief Complaint  Patient presents with  . Fall  . Leg Pain   (Consider location/radiation/quality/duration/timing/severity/associated sxs/prior Treatment) HPI Patient presents via EMS for left upper leg pain. She states she was getting up to the table and she felt a pop and severe pain to her left upper leg. She then fell to the floor. She had noted left lower extremity shortening on EMS's arrival. She was placed in a hairpin traction. She was noted to be tachycardic and hypoxic in route. She was given 250 mcg of fentanyl. Patient denies any head or neck injury. She denies any shortness of breath or chest pain. She denies any abdominal pain, and nausea or vomiting. Patient does have a history of diabetes mellitus and hypothyroidism for which she takes Synthroid Past Medical History  Diagnosis Date  . Diabetes mellitus without complication   . Thyroid disease    Past Surgical History  Procedure Laterality Date  . Tubes in ears    . Back surgery     History reviewed. No pertinent family history. History  Substance Use Topics  . Smoking status: Never Smoker   . Smokeless tobacco: Not on file  . Alcohol Use: No   OB History   Grav Para Term Preterm Abortions TAB SAB Ect Mult Living                 Review of Systems  Constitutional: Negative for fever and chills.  HENT: Negative for sore throat.   Eyes: Negative for visual disturbance.  Respiratory: Negative for cough, shortness of breath and wheezing.   Cardiovascular: Negative for chest pain, palpitations and leg swelling.  Gastrointestinal: Negative for nausea, vomiting, abdominal pain and diarrhea.  Genitourinary: Negative for dysuria and flank pain.  Musculoskeletal: Negative for back pain, neck pain and neck stiffness.  Skin: Negative for rash and wound.  Neurological: Positive for numbness. Negative  for dizziness, syncope, weakness and light-headedness.  All other systems reviewed and are negative.    Allergies  Penicillins  Home Medications   Current Outpatient Rx  Name  Route  Sig  Dispense  Refill  . acetaminophen (TYLENOL) 500 MG tablet   Oral   Take 500 mg by mouth daily as needed for pain.         . diphenhydrAMINE (BENADRYL) 25 MG tablet   Oral   Take 1 tablet (25 mg total) by mouth every 6 (six) hours.   20 tablet   0   . fluticasone (FLONASE) 50 MCG/ACT nasal spray   Nasal   Place 2 sprays into the nose daily.         Marland Kitchen glimepiride (AMARYL) 2 MG tablet   Oral   Take 2 mg by mouth 2 (two) times daily.         Marland Kitchen levothyroxine (SYNTHROID, LEVOTHROID) 137 MCG tablet   Oral   Take 137 mcg by mouth daily.         . predniSONE (DELTASONE) 10 MG tablet   Oral   Take 2 tablets (20 mg total) by mouth daily.   14 tablet   0   . ranitidine (ZANTAC) 150 MG tablet   Oral   Take 1 tablet (150 mg total) by mouth 2 (two) times daily.   10 tablet   0    BP 139/92  Pulse 126  Temp(Src)  100.2 F (37.9 C) (Oral)  Resp 17  SpO2 93% Physical Exam  Nursing note and vitals reviewed. Constitutional: She is oriented to person, place, and time. She appears well-developed and well-nourished. No distress.  Patient is morbidly obese. She appears comfortable  HENT:  Head: Normocephalic and atraumatic.  Mouth/Throat: Oropharynx is clear and moist. No oropharyngeal exudate.  Eyes: EOM are normal. Pupils are equal, round, and reactive to light.  Neck: Normal range of motion. Neck supple.  No posterior midline cervical tenderness. No meningismus  Cardiovascular: Regular rhythm.   Tachycardia  Pulmonary/Chest: Effort normal and breath sounds normal. No respiratory distress. She has no wheezes. She has no rales. She exhibits no tenderness.  Abdominal: Soft. Bowel sounds are normal. She exhibits no distension and no mass. There is no tenderness. There is no rebound  and no guarding.  Musculoskeletal: Normal range of motion. She exhibits no edema and no tenderness.  Decreased range of motion to the left lower extremity. She is tender to palpation over the distal femur. She has 2+ dorsalis pedis pulses on the right. All compartments remain soft.  Neurological: She is alert and oriented to person, place, and time.  Decreased movement the left lower leg personally due to pain. She is able to move her toes on the left. She has decreased sensation to the left foot compared to the right in a stocking-type distribution.   Skin: Skin is warm and dry. No rash noted. No erythema.  Psychiatric: She has a normal mood and affect. Her behavior is normal.    ED Course  Procedures (including critical care time) Labs Review Labs Reviewed  CBC WITH DIFFERENTIAL  COMPREHENSIVE METABOLIC PANEL  PRO B NATRIURETIC PEPTIDE  TSH  TROPONIN I  HCG, SERUM, QUALITATIVE  BLOOD GAS, ARTERIAL  URINALYSIS, ROUTINE W REFLEX MICROSCOPIC   Imaging Review No results found.  EKG Interpretation    Date/Time:  Tuesday October 03 2013 00:25:32 EST Ventricular Rate:  135 PR Interval:  96 QRS Duration: 79 QT Interval:  304 QTC Calculation: 456 R Axis:   -98 Text Interpretation:  Sinus tachycardia Left anterior fascicular block Nonspecific T abnormalities, lateral leads Confirmed by Ranae Palms  MD, Najeeb Uptain ([id number]) on 10/03/2013 4:20:15 AM            MDM   Discussed with Dr. Ophelia Charter. Recommended placing the patient in Buck's traction with 5 pounds. Will see in the morning. Recommended admitting to medicine service. Discussed with Dr. Allena Katz. Will admit the patient to a telemetry bed. Patient remains persistently tachycardic in emergency department requiring supplemental oxygen. She is in no respiratory distress. The compartments of her leg remain soft.   Loren Racer, MD 10/03/13 [phone]

## 2013-10-03 NOTE — Brief Op Note (Signed)
10/02/2013 - 10/03/2013  7:56 PM  PATIENT:  Alexandra Kelley  32 y.o. female  PRE-OPERATIVE DIAGNOSIS:  left femur fracture  POST-OPERATIVE DIAGNOSIS:  left femur fracture  PROCEDURE:  Procedure(s): INTRAMEDULLARY (IM) RETROGRADE FEMORAL NAILING (Left)  SURGEON:  Surgeon(s) and Role:    * Eldred Manges, MD - Primary  PHYSICIAN ASSISTANT:   ASSISTANTS: RNFA  ANESTHESIA:   local and general  EBL:  Total I/O In: -  Out: 50 [Blood:50]  BLOOD ADMINISTERED:none  DRAINS: none   LOCAL MEDICATIONS USED:  MARCAINE     SPECIMEN:  No Specimen  DISPOSITION OF SPECIMEN:  N/A  COUNTS:  YES  TOURNIQUET:  * No tourniquets in log *  DICTATION: .Other Dictation: Dictation Number [id number]  PLAN OF CARE: still inpatient  PATIENT DISPOSITION:  PACU - hemodynamically stable.   Delay start of Pharmacological VTE agent (>24hrs) due to surgical blood loss or risk of bleeding: SCD and ASA

## 2013-10-03 NOTE — Progress Notes (Signed)
Orthopedic Tech Progress Note Patient Details:  Alexandra Kelley December 16, 1980 [id number]  Patient ID: [name], female   DOB: [birth year], 30 y.o.   MRN: [id number] Trapeze bar patient helper  Nikki Dom 10/03/2013, 10:32 PM

## 2013-10-03 NOTE — Anesthesia Preprocedure Evaluation (Addendum)
Anesthesia Evaluation  Patient identified by MRN, date of birth, ID band Patient awake    Reviewed: Allergy & Precautions, H&P , NPO status , Patient's Chart, lab work & pertinent test results  History of Anesthesia Complications Negative for: history of anesthetic complications  Airway Mallampati: III TM Distance: <3 FB Neck ROM: Full    Dental  (+) Teeth Intact and Dental Advisory Given   Pulmonary neg pulmonary ROS,          Cardiovascular Exercise Tolerance: Good     Neuro/Psych  Neuromuscular disease (tingling and numbness in LLE)    GI/Hepatic GERD- (takes Zantac PRN)  Medicated,  Endo/Other  diabetes (CBG 298 10/03/13), Type 2, Oral Hypoglycemic AgentsHypothyroidism (synthroid) Morbid obesity  Renal/GU      Musculoskeletal   Abdominal   Peds  Hematology   Anesthesia Other Findings   Reproductive/Obstetrics                          Anesthesia Physical Anesthesia Plan  ASA: II  Anesthesia Plan: General   Post-op Pain Management:    Induction: Intravenous  Airway Management Planned: Oral ETT  Additional Equipment:   Intra-op Plan:   Post-operative Plan: Extubation in OR  Informed Consent: I have reviewed the patients History and Physical, chart, labs and discussed the procedure including the risks, benefits and alternatives for the proposed anesthesia with the patient or authorized representative who has indicated his/her understanding and acceptance.     Plan Discussed with: CRNA, Anesthesiologist and Surgeon  Anesthesia Plan Comments:         Anesthesia Quick Evaluation

## 2013-10-03 NOTE — Transfer of Care (Signed)
Immediate Anesthesia Transfer of Care Note  Patient: Alexandra Kelley  Procedure(s) Performed: Procedure(s): INTRAMEDULLARY (IM) RETROGRADE FEMORAL NAILING (Left)  Patient Location: PACU  Anesthesia Type:General  Level of Consciousness: awake, alert  and oriented  Airway & Oxygen Therapy: Patient connected to face mask oxygen  Post-op Assessment: Report given to PACU RN, Post -op Vital signs reviewed and stable and Patient moving all extremities X 4  Post vital signs: Reviewed and stable  Complications: No apparent anesthesia complications

## 2013-10-03 NOTE — Preoperative (Signed)
Beta Blockers   Reason not to administer Beta Blockers:Not Applicable 

## 2013-10-03 NOTE — Anesthesia Postprocedure Evaluation (Signed)
  Anesthesia Post-op Note  Patient: Alexandra Kelley  Procedure(s) Performed: Procedure(s): INTRAMEDULLARY (IM) RETROGRADE FEMORAL NAILING (Left)  Patient Location: PACU  Anesthesia Type:General  Level of Consciousness: awake, alert  and oriented  Airway and Oxygen Therapy: Patient Spontanous Breathing and Patient connected to face mask oxygen  Post-op Pain: moderate  Post-op Assessment: Post-op Vital signs reviewed  Post-op Vital Signs: Reviewed  Complications: No apparent anesthesia complications

## 2013-10-03 NOTE — ED Notes (Signed)
Pt was getting up from the dinner table and states she felt a pop and had severe onset of pain to left upper thigh. On EMS arrival shortening noted to left leg with swelling to left thigh. No hip involvement noted. Pt given of fentanyl to left hand 22g. Pt noted to have O2 sats in the low 90s and CO2 detector noted at 52. Pts left leg is in traction and she is lying flat. Pt reports mild sob while laying supine. Pt placed on oxygen.

## 2013-10-03 NOTE — H&P (View-Only) (Signed)
Reason for Consult:eft femur fracture Referring Physician: Sullivan C.   MD  Alexandra Kelley is an 32 y.o. female.  HPI: 32 yo female was getting up from kitchen chair when she had sudden pain with femur fracture left.   Past Medical History  Diagnosis Date  . Diabetes mellitus without complication   . Thyroid disease     Past Surgical History  Procedure Laterality Date  . Tubes in ears    . Back surgery      History reviewed. No pertinent family history.  Social History:  reports that she has never smoked. She does not have any smokeless tobacco history on file. She reports that she does not drink alcohol or use illicit drugs.  Allergies:  Allergies  Allergen Reactions  . Ceclor [Cefaclor]     Rash  . Penicillins Rash    Medications: I have reviewed the patient's current medications.  Results for orders placed during the hospital encounter of 10/02/13 (from the past 48 hour(s))  CBC WITH DIFFERENTIAL     Status: Abnormal   Collection Time    10/03/13 12:14 AM      Result Value Range   WBC 16.5 (*) 4.0 - 10.5 K/uL   RBC 4.67  3.87 - 5.11 MIL/uL   Hemoglobin 14.3  12.0 - 15.0 g/dL   HCT 41.9  36.0 - 46.0 %   MCV 89.7  78.0 - 100.0 fL   MCH 30.6  26.0 - 34.0 pg   MCHC 34.1  30.0 - 36.0 g/dL   RDW 14.7  11.5 - 15.5 %   Platelets 246  150 - 400 K/uL   Neutrophils Relative % 85 (*) 43 - 77 %   Neutro Abs 14.0 (*) 1.7 - 7.7 K/uL   Lymphocytes Relative 10 (*) 12 - 46 %   Lymphs Abs 1.7  0.7 - 4.0 K/uL   Monocytes Relative 4  3 - 12 %   Monocytes Absolute 0.7  0.1 - 1.0 K/uL   Eosinophils Relative 0  0 - 5 %   Eosinophils Absolute 0.0  0.0 - 0.7 K/uL   Basophils Relative 0  0 - 1 %   Basophils Absolute 0.0  0.0 - 0.1 K/uL  COMPREHENSIVE METABOLIC PANEL     Status: Abnormal   Collection Time    10/03/13 12:14 AM      Result Value Range   Sodium 137  135 - 145 mEq/L   Potassium 4.7  3.5 - 5.1 mEq/L   Chloride 96  96 - 112 mEq/L   CO2 30  19 - 32 mEq/L    Glucose, Bld 353 (*) 70 - 99 mg/dL   BUN 13  6 - 23 mg/dL   Creatinine, Ser 0.68  0.50 - 1.10 mg/dL   Calcium 9.6  8.4 - 10.5 mg/dL   Total Protein 7.8  6.0 - 8.3 g/dL   Albumin 4.1  3.5 - 5.2 g/dL   AST 33  0 - 37 U/L   ALT 38 (*) 0 - 35 U/L   Alkaline Phosphatase 86  39 - 117 U/L   Total Bilirubin 0.4  0.3 - 1.2 mg/dL   GFR calc non Af Amer >90  >90 mL/min   GFR calc Af Amer >90  >90 mL/min   Comment: (NOTE)     The eGFR has been calculated using the CKD EPI equation.     This calculation has not been validated in all clinical situations.     eGFR's   persistently <90 mL/min signify possible Chronic Kidney     Disease.  PRO B NATRIURETIC PEPTIDE     Status: None   Collection Time    10/03/13 12:14 AM      Result Value Range   Pro B Natriuretic peptide (BNP) 17.7  0 - 125 pg/mL  TSH     Status: Abnormal   Collection Time    10/03/13 12:14 AM      Result Value Range   TSH 5.154 (*) 0.350 - 4.500 uIU/mL   Comment: Performed at [facility]  TROPONIN I     Status: None   Collection Time    10/03/13 12:14 AM      Result Value Range   Troponin I <0.30  <0.30 ng/mL   Comment:            Due to the release kinetics of cTnI,     a negative result within the first hours     of the onset of symptoms does not rule out     myocardial infarction with certainty.     If myocardial infarction is still suspected,     repeat the test at appropriate intervals.  HCG, SERUM, QUALITATIVE     Status: None   Collection Time    10/03/13 12:14 AM      Result Value Range   Preg, Serum NEGATIVE  NEGATIVE   Comment:            THE SENSITIVITY OF THIS     METHODOLOGY IS >10 mIU/mL.  POCT I-STAT 3, BLOOD GAS (G3P V)     Status: Abnormal   Collection Time    10/03/13  1:43 AM      Result Value Range   pH, Ven 7.356 (*) 7.250 - 7.300   pCO2, Ven 53.1 (*) 45.0 - 50.0 mmHg   pO2, Ven 54.0 (*) 30.0 - 45.0 mmHg   Bicarbonate 29.5 (*) 20.0 - 24.0 mEq/L   TCO2 31  0 - 100 mmol/L   O2  Saturation 83.0     Acid-Base Excess 3.0 (*) 0.0 - 2.0 mmol/L   Patient temperature 100.2 F     Collection site IV START     Sample type VENOUS    URINALYSIS, ROUTINE W REFLEX MICROSCOPIC     Status: Abnormal   Collection Time    10/03/13  5:06 AM      Result Value Range   Color, Urine YELLOW  YELLOW   APPearance CLOUDY (*) CLEAR   Specific Gravity, Urine 1.010  1.005 - 1.030   pH 6.0  5.0 - 8.0   Glucose, UA >1000 (*) NEGATIVE mg/dL   Hgb urine dipstick LARGE (*) NEGATIVE   Bilirubin Urine NEGATIVE  NEGATIVE   Ketones, ur 15 (*) NEGATIVE mg/dL   Protein, ur NEGATIVE  NEGATIVE mg/dL   Urobilinogen, UA 0.2  0.0 - 1.0 mg/dL   Nitrite NEGATIVE  NEGATIVE   Leukocytes, UA SMALL (*) NEGATIVE  URINE MICROSCOPIC-ADD ON     Status: Abnormal   Collection Time    10/03/13  5:06 AM      Result Value Range   Squamous Epithelial / LPF FEW (*) RARE   WBC, UA 0-2  <3 WBC/hpf   RBC / HPF 0-2  <3 RBC/hpf   Bacteria, UA RARE  RARE   Urine-Other MICROSCOPIC EXAM PERFORMED ON UNCONCENTRATED URINE    GLUCOSE, CAPILLARY     Status: Abnormal   Collection Time      10/03/13  6:14 AM      Result Value Range   Glucose-Capillary 298 (*) 70 - 99 mg/dL  TYPE AND SCREEN     Status: None   Collection Time    10/03/13  7:45 AM      Result Value Range   ABO/RH(D) O POS     Antibody Screen NEG     Sample Expiration 10/06/2013    ABO/RH     Status: None   Collection Time    10/03/13  7:45 AM      Result Value Range   ABO/RH(D) O POS      Dg Chest 1 View  10/03/2013   CLINICAL DATA:  Fall, leg pain  EXAM: CHEST - 1 VIEW  COMPARISON:  None.  FINDINGS: Hypoaeration and portable technique. Cardiomediastinal contours are likely within normal range allowing for this. No common airspace opacity, pleural effusion, or pneumothorax. No acute osseous finding.  IMPRESSION: Hypoaeration.  No acute process identified.   Electronically Signed   By: Andrew  DelGaizo M.D.   On: 10/03/2013 01:27   Dg Femur  Left  10/03/2013   CLINICAL DATA:  Fall, leg pain  EXAM: LEFT FEMUR - 2 VIEW  COMPARISON:  None.  FINDINGS: Comminuted predominantly oblique fracture through the mid and distal left femoral shaft. There is medial displacement and angulation of the distal component. Femoral head grossly remains seated within the acetabulum. The inferior margin of the fracture extends to the patellofemoral articulation anteriorly. No definite extension into the tibial femoral articulation. Small joint effusion.  IMPRESSION: Displaced complex fracture of the left femur as above.   Electronically Signed   By: Andrew  DelGaizo M.D.   On: 10/03/2013 01:26   Ct Angio Chest Pe W/cm &/or Wo Cm  10/03/2013   CLINICAL DATA:  Leg swelling  EXAM: CT ANGIOGRAPHY CHEST WITH CONTRAST  TECHNIQUE: Multidetector CT imaging of the chest was performed using the standard protocol during bolus administration of intravenous contrast. Multiplanar CT image reconstructions including MIPs were obtained to evaluate the vascular anatomy.  CONTRAST:  80mL OMNIPAQUE IOHEXOL 350 MG/ML SOLN  COMPARISON:  None.  FINDINGS: Contrast bolus timing is not optimized to evaluate for pulmonary embolism. No large/central filling defect. The lobar and more peripheral branches are nondiagnostic.  Heart size upper normal to mildly enlarged. Normal caliber aorta. No overt pleural or pericardial effusion. No lymphadenopathy. Limited upper abdominal images show hepatic steatosis.  Degraded by respiratory motion. Ground-glass opacity and mosaic attenuation, favored to reflect atelectasis/areas of air trapping. No confluent airspace opacity. No pneumothorax.  No acute osseous finding.  Review of the MIP images confirms the above findings.  IMPRESSION: Suboptimal contrast bolus timing. No central pulmonary embolism. Lobar or more peripheral branches are nondiagnostic.  Degraded by respiratory motion/expiratory phase. Mosaic attenuation suggests areas of air trapping and  atelectasis.  Hepatic steatosis.   Electronically Signed   By: Andrew  DelGaizo M.D.   On: 10/03/2013 03:11    Review of Systems  Constitutional: Negative for fever and weight loss.  HENT:       Glasses   Eyes: Negative for blurred vision and photophobia.  Respiratory: Negative for shortness of breath and wheezing.   Cardiovascular: Negative for chest pain and orthopnea.  Genitourinary:       Neg  Neurological: Negative for dizziness and tremors.  Endo/Heme/Allergies: Does not bruise/bleed easily.       Diabetes times about 3 yrs.  Takes oral medication for this  Psychiatric/Behavioral: Negative for depression.     Blood pressure 111/73, pulse 118, temperature 98.3 F (36.8 C), temperature source Oral, resp. rate 17, height 5' 3" (1.6 m), weight 152.7 kg (336 lb 10.3 oz), SpO2 98.00%. Physical Exam  Constitutional: She appears well-developed.  Increased BMI   HENT:  Head: Normocephalic and atraumatic.  Eyes: Pupils are equal, round, and reactive to light.  Neck: Normal range of motion.  Cardiovascular: Normal rate.   Respiratory: Effort normal.  GI: Soft.  Increased BMI no tenderness  Musculoskeletal:  Left foot normal sensation intact pulses.   Neurological: She is alert.  Skin: Skin is warm and dry.  Psychiatric: She has a normal mood and affect. Her behavior is normal.    Assessment/Plan: Left closed comminuted femur fracture with extension distally down to patellofemoral joint. Plan plate fixation stabilization. Risks discussed all ? Answered.   Lisa Blakeman C 10/03/2013, 12:11 PM      

## 2013-10-03 NOTE — Care Management Note (Addendum)
Page 2 of 2   10/06/2013     4:54:50 PM   CARE MANAGEMENT NOTE 10/06/2013  Patient:  Alexandra Kelley, Alexandra Kelley   Account Number:  [id number]  Date Initiated:  10/03/2013  Documentation initiated by:  AMERSON,JULIE  Subjective/Objective Assessment:   PT ADM ON 10/02/13 S/P FALL WITH FEMUR FRACTURE.  PTA, PT INDEPENDENT, LIVES WITH PARENTS.     Action/Plan:   WILL FOLLOW FOR DC NEEDS AS PT PROGRESSES.  SURGERY PENDING.   Anticipated DC Date:  10/05/2013   Anticipated DC Plan:  HOME W HOME HEALTH SERVICES      DC Planning Services  CM consult      Choice offered to / List presented to:             Status of service:  In process, will continue to follow Medicare Important Message given?   (If response is "NO", the following Medicare IM given date fields will be blank) Date Medicare IM given:   Date Additional Medicare IM given:    Discharge Disposition:    Per UR Regulation:  Reviewed for med. necessity/level of care/duration of stay  If discussed at Long Length of Stay Meetings, dates discussed:    Comments:    10-06-13 1705 Updated patient and her father on information provided by Elnita Maxwell at [facility] regarding Home health orders , they do not have home health OT , or home health respiratory , and they could probably not see her for PT until later end of next week .  Patient's father said they will don't want that agency . Provided second copy of home health agencies .   Patient's father approached NCM in hallway upset and angry , stated his wife is calling someone at [facility] to get patient accepted there.   Ronny Flurry RN BSN [phone]   10-06-13 Spoke with Elnita Maxwell at [facility] regarding Home health orders , they do not have home health OT , or home health respiratory , and they could probably not see her for PT until later end of next week .  Ronny Flurry RN BSN [phone]   10-06-13 1600 , only bed offer at [facility] is [facility] . Patient and  father both refusing this offer , explained to patient and her father patient is medically ready for discharge today and home health can be set up . Patient's father became very upset and refused to take patient home today . He states patient is staying until Monday . DR Jeralyn Bennett [phone] spoke with patient's father .  See Dr Dario Ave progress note patient's blood sugar is elevated and creatinine , continue genlte IV hydration at present.  Dr Jacky Kindle aware and will discuss case with Dr Jeralyn Ruths RN BSN [phone]  10-06-13 Spoke with patient and her mother at bedside. Confirmed facesheet information.  Patient's mother would prefer patient to go to short term rehabd at [facility] if [facility] will pay for PT there . If not patient's mother works with [facility] and would like home health through them phone [phone] ext [phone]  .  Patient in agreement . Provided list of Home Health Agencies for [facility] .  Patient needing CPAP at discharge. Left referral form for sleep study in shadow chart for MD to complete .  Patient can have sleep study done at [facility] . Once form completed . Sleep study will  be set up through [facility] phone [phone]  Ronny Flurry RN BSN [phone]

## 2013-10-03 NOTE — Interval H&P Note (Signed)
History and Physical Interval Note:  10/03/2013 5:30 PM  Alexandra Kelley  has presented today for surgery, with the diagnosis of left femur fracture  The various methods of treatment have been discussed with the patient and family. After consideration of risks, benefits and other options for treatment, the patient has consented to  Procedure(s): INTRAMEDULLARY (IM) RETROGRADE FEMORAL NAILING (Left) as a surgical intervention .  The patient's history has been reviewed, patient examined, no change in status, stable for surgery.  I have reviewed the patient's chart and labs.  Questions were answered to the patient's satisfaction.     Delmus Warwick C

## 2013-10-03 NOTE — Consult Note (Signed)
Consult: urinary retention, difficult foley Requested by: Lynden Oxford, MD   History of Present Illness: Pt fell and fractured left leg. She is in traction and pain. She needs to void and cannot. Bladder scan 900 ml.   Here with her parents. No GU hx.  Past Medical History  Diagnosis Date  . Diabetes mellitus without complication   . Thyroid disease    Past Surgical History  Procedure Laterality Date  . Tubes in ears    . Back surgery      Home Medications:  Prescriptions prior to admission  Medication Sig Dispense Refill  . fluticasone (FLONASE) 50 MCG/ACT nasal spray Place 2 sprays into the nose daily.      Marland Kitchen glimepiride (AMARYL) 4 MG tablet Take 4 mg by mouth 2 (two) times daily.      Marland Kitchen levothyroxine (SYNTHROID, LEVOTHROID) 137 MCG tablet Take 137 mcg by mouth daily.      . ranitidine (ZANTAC) 150 MG tablet Take 150 mg by mouth 2 (two) times daily as needed for heartburn.       Allergies:  Allergies  Allergen Reactions  . Ceclor [Cefaclor]     Rash  . Penicillins Rash    History reviewed. No pertinent family history. Social History:  reports that she has never smoked. She does not have any smokeless tobacco history on file. She reports that she does not drink alcohol or use illicit drugs.  ROS: A complete review of systems was performed.  All systems are negative except for pertinent findings as noted. ROS   Physical Exam:  Vital signs in last 24 hours: Temp:  [98.6 F (37 C)-100.3 F (37.9 C)] 98.6 F (37 C) (12/02 0545) Pulse Rate:  [126-139] 133 (12/02 0545) Resp:  [16-32] 18 (12/02 0545) BP: (109-139)/(49-92) 109/72 mmHg (12/02 0545) SpO2:  [86 %-96 %] 93 % (12/02 0545) Weight:  [152.7 kg (336 lb 10.3 oz)] 152.7 kg (336 lb 10.3 oz) (12/02 0545) General:  Alert and oriented, No acute distress HEENT: Normocephalic, atraumatic Neck: No JVD or lymphadenopathy Cardiovascular: Regular rate and rhythm Lungs: Regular rate and effort Abdomen: Soft, nontender,  nondistended, no abdominal masses, obese Back: No CVA tenderness Extremities: No edema Neurologic: Grossly intact, left leg in traction  Procedure: night nures and day nurse assisted - pt is obese, nulliparous and nervous. She is tensing and pulling away. On exam bladder and urethra palpably normal. I cannot visualize meatus despite nurses help in retraction. Foley guided in blindly on left index finger. Clear urine return. Balloon inflated, seated at bladder neck on palpation. Drained 1000 ml, clear yellow. Will cover with a Cipro.   Laboratory Data:  Results for orders placed during the hospital encounter of 10/02/13 (from the past 24 hour(s))  CBC WITH DIFFERENTIAL     Status: Abnormal   Collection Time    10/03/13 12:14 AM      Result Value Range   WBC 16.5 (*) 4.0 - 10.5 K/uL   RBC 4.67  3.87 - 5.11 MIL/uL   Hemoglobin 14.3  12.0 - 15.0 g/dL   HCT 95.6  21.3 - 08.6 %   MCV 89.7  78.0 - 100.0 fL   MCH 30.6  26.0 - 34.0 pg   MCHC 34.1  30.0 - 36.0 g/dL   RDW 57.8  46.9 - 62.9 %   Platelets 246  150 - 400 K/uL   Neutrophils Relative % 85 (*) 43 - 77 %   Neutro Abs 14.0 (*) 1.7 - 7.7  K/uL   Lymphocytes Relative 10 (*) 12 - 46 %   Lymphs Abs 1.7  0.7 - 4.0 K/uL   Monocytes Relative 4  3 - 12 %   Monocytes Absolute 0.7  0.1 - 1.0 K/uL   Eosinophils Relative 0  0 - 5 %   Eosinophils Absolute 0.0  0.0 - 0.7 K/uL   Basophils Relative 0  0 - 1 %   Basophils Absolute 0.0  0.0 - 0.1 K/uL  COMPREHENSIVE METABOLIC PANEL     Status: Abnormal   Collection Time    10/03/13 12:14 AM      Result Value Range   Sodium 137  135 - 145 mEq/L   Potassium 4.7  3.5 - 5.1 mEq/L   Chloride 96  96 - 112 mEq/L   CO2 30  19 - 32 mEq/L   Glucose, Bld 353 (*) 70 - 99 mg/dL   BUN 13  6 - 23 mg/dL   Creatinine, Ser 4.09  0.50 - 1.10 mg/dL   Calcium 9.6  8.4 - 81.1 mg/dL   Total Protein 7.8  6.0 - 8.3 g/dL   Albumin 4.1  3.5 - 5.2 g/dL   AST 33  0 - 37 U/L   ALT 38 (*) 0 - 35 U/L   Alkaline  Phosphatase 86  39 - 117 U/L   Total Bilirubin 0.4  0.3 - 1.2 mg/dL   GFR calc non Af Amer >90  >90 mL/min   GFR calc Af Amer >90  >90 mL/min  PRO B NATRIURETIC PEPTIDE     Status: None   Collection Time    10/03/13 12:14 AM      Result Value Range   Pro B Natriuretic peptide (BNP) 17.7  0 - 125 pg/mL  TROPONIN I     Status: None   Collection Time    10/03/13 12:14 AM      Result Value Range   Troponin I <0.30  <0.30 ng/mL  HCG, SERUM, QUALITATIVE     Status: None   Collection Time    10/03/13 12:14 AM      Result Value Range   Preg, Serum NEGATIVE  NEGATIVE  POCT I-STAT 3, BLOOD GAS (G3P V)     Status: Abnormal   Collection Time    10/03/13  1:43 AM      Result Value Range   pH, Ven 7.356 (*) 7.250 - 7.300   pCO2, Ven 53.1 (*) 45.0 - 50.0 mmHg   pO2, Ven 54.0 (*) 30.0 - 45.0 mmHg   Bicarbonate 29.5 (*) 20.0 - 24.0 mEq/L   TCO2 31  0 - 100 mmol/L   O2 Saturation 83.0     Acid-Base Excess 3.0 (*) 0.0 - 2.0 mmol/L   Patient temperature 100.2 F     Collection site IV START     Sample type VENOUS    URINALYSIS, ROUTINE W REFLEX MICROSCOPIC     Status: Abnormal   Collection Time    10/03/13  5:06 AM      Result Value Range   Color, Urine YELLOW  YELLOW   APPearance CLOUDY (*) CLEAR   Specific Gravity, Urine 1.010  1.005 - 1.030   pH 6.0  5.0 - 8.0   Glucose, UA >1000 (*) NEGATIVE mg/dL   Hgb urine dipstick LARGE (*) NEGATIVE   Bilirubin Urine NEGATIVE  NEGATIVE   Ketones, ur 15 (*) NEGATIVE mg/dL   Protein, ur NEGATIVE  NEGATIVE mg/dL   Urobilinogen, UA 0.2  0.0 - 1.0 mg/dL   Nitrite NEGATIVE  NEGATIVE   Leukocytes, UA SMALL (*) NEGATIVE  URINE MICROSCOPIC-ADD ON     Status: Abnormal   Collection Time    10/03/13  5:06 AM      Result Value Range   Squamous Epithelial / LPF FEW (*) RARE   WBC, UA 0-2  <3 WBC/hpf   RBC / HPF 0-2  <3 RBC/hpf   Bacteria, UA RARE  RARE   Urine-Other MICROSCOPIC EXAM PERFORMED ON UNCONCENTRATED URINE    GLUCOSE, CAPILLARY     Status:  Abnormal   Collection Time    10/03/13  6:14 AM      Result Value Range   Glucose-Capillary 298 (*) 70 - 99 mg/dL   No results found for this or any previous visit (from the past 240 hour(s)). Creatinine:  Recent Labs  10/03/13 0014  CREATININE 0.68    Impression/Assessment:  Urinary retention - likely from non-ambulation, pain, traction, etc.   Plan:  -start tamsulosin -d/c foley when pt is able to ambulate, transfer to toilet.   My contact info given to parents.   Antony Haste 10/03/2013, 7:25 AM

## 2013-10-03 NOTE — Progress Notes (Signed)
Patient admitted after midnight.  Chart reviewed. Patient examined.  Stable. Awaiting surgery.  Crista Curb, M.D. [phone]

## 2013-10-03 NOTE — Anesthesia Procedure Notes (Signed)
Procedure Name: Intubation Date/Time: 10/03/2013 5:42 PM Performed by: Orvilla Fus A Pre-anesthesia Checklist: Patient identified, Timeout performed, Emergency Drugs available, Suction available and Patient being monitored Patient Re-evaluated:Patient Re-evaluated prior to inductionOxygen Delivery Method: Circle system utilized Preoxygenation: Pre-oxygenation with 100% oxygen Intubation Type: IV induction Ventilation: Mask ventilation without difficulty, Oral airway inserted - appropriate to patient size and Two handed mask ventilation required Grade View: Grade I Tube size: 7.0 mm Number of attempts: 1 Airway Equipment and Method: Rigid stylet and Video-laryngoscopy Placement Confirmation: ETT inserted through vocal cords under direct vision,  breath sounds checked- equal and bilateral and positive ETCO2 Secured at: 20 cm Tube secured with: Tape Dental Injury: Teeth and Oropharynx as per pre-operative assessment  Difficulty Due To: Difficulty was anticipated, Difficult Airway- due to reduced neck mobility and Difficult Airway- due to large tongue Comments: TMD <3 fb

## 2013-10-03 NOTE — H&P (Signed)
[facility] History and Physical  Patient: Alexandra Kelley  ZOX:[id number]  DOB: 17-Sep-1981  DOS: the patient was seen and examined on 10/03/2013 PCP: Colette Ribas, MD  Chief Complaint: Fall  HPI: Alexandra Kelley is a 32 y.o. female with Past medical history of diabetes and hypothyroidism. The patient is coming from home. The patient is presenting with a mechanical fall. As per the EMS when the patient was standing up from the dinner table she had a fall and severe onset of pain on the left high followed by a near fall.  The patient does not remember the event but As per my discussion with the family, apparently when she tried to stand up from the dinner table she tripped over something and then had a fall and then they were not able to lift her up so they called first responders and then EMS. The patient herself at present denies any complaint of headache chest pain nausea or vomiting during the day diarrhea or constipation during the day burning urination cough abdominal pain or any focal neurological deficit. Her only complaint at present is pain in her left thigh. As per the family there is no change in her medication list. EMS gave her 250 mcg of fentanyl on arrival.  Review of Systems: as mentioned in the history of present illness.  A Comprehensive review of the other systems is negative.  Past Medical History  Diagnosis Date  . Diabetes mellitus without complication   . Thyroid disease    Past Surgical History  Procedure Laterality Date  . Tubes in ears    . Back surgery     Social History:  reports that she has never smoked. She does not have any smokeless tobacco history on file. She reports that she does not drink alcohol or use illicit drugs. Independent for most of her  ADL.  Allergies  Allergen Reactions  . Ceclor [Cefaclor]     Rash  . Penicillins Rash    History reviewed. No pertinent family history.  Prior to Admission medications    Medication Sig Start Date End Date Taking? Authorizing Provider  fluticasone (FLONASE) 50 MCG/ACT nasal spray Place 2 sprays into the nose daily.   Yes Historical Provider, MD  glimepiride (AMARYL) 4 MG tablet Take 4 mg by mouth 2 (two) times daily.   Yes Historical Provider, MD  levothyroxine (SYNTHROID, LEVOTHROID) 137 MCG tablet Take 137 mcg by mouth daily.   Yes Historical Provider, MD  ranitidine (ZANTAC) 150 MG tablet Take 150 mg by mouth 2 (two) times daily as needed for heartburn. 01/27/13   Hope Orlene Och, NP    Physical Exam: Filed Vitals:   10/03/13 0147 10/03/13 0330 10/03/13 0430 10/03/13 0545  BP: 115/61 127/49 118/51 109/72  Pulse: 130 133 129 133  Temp: 100.3 F (37.9 C)   98.6 F (37 C)  TempSrc: Oral   Oral  Resp: 19 18 16 18   Weight:    152.7 kg (336 lb 10.3 oz)  SpO2: 96% 93% 93% 93%    General: Alert, Awake and Oriented to Time, Place and Person. Appear in moderate distress Eyes: PERRL ENT: Oral Mucosa clear moist. Neck: Difficult to assess JVD Cardiovascular: S1 and S2 Present, no Murmur, Peripheral Pulses Present Respiratory: Bilateral Air entry equal and Decreased, Clear to Auscultation,  no Crackles,no wheezes Abdomen: Bowel Sound Present, Soft and Non tender Skin: no Rash Extremities: Left thigh swelling as compared to the right with pain and warmth, no redness,  no Pedal edema, no calf tenderness, bilateral pulses palpable Neurologic: Grossly Unremarkable.  Labs on Admission:  CBC:  Recent Labs Lab 10/03/13 0014  WBC 16.5*  NEUTROABS 14.0*  HGB 14.3  HCT 41.9  MCV 89.7  PLT 246    CMP     Component Value Date/Time   NA 137 10/03/2013 0014   K 4.7 10/03/2013 0014   CL 96 10/03/2013 0014   CO2 30 10/03/2013 0014   GLUCOSE 353* 10/03/2013 0014   BUN 13 10/03/2013 0014   CREATININE 0.68 10/03/2013 0014   CALCIUM 9.6 10/03/2013 0014   PROT 7.8 10/03/2013 0014   ALBUMIN 4.1 10/03/2013 0014   AST 33 10/03/2013 0014   ALT 38* 10/03/2013 0014    ALKPHOS 86 10/03/2013 0014   BILITOT 0.4 10/03/2013 0014   GFRNONAA >90 10/03/2013 0014   GFRAA >90 10/03/2013 0014    No results found for this basename: LIPASE, AMYLASE,  in the last 168 hours No results found for this basename: AMMONIA,  in the last 168 hours   Recent Labs Lab 10/03/13 0014  TROPONINI <0.30   BNP (last 3 results)  Recent Labs  10/03/13 0014  PROBNP 17.7    Radiological Exams on Admission: Dg Chest 1 View  10/03/2013   CLINICAL DATA:  Fall, leg pain  EXAM: CHEST - 1 VIEW  COMPARISON:  None.  FINDINGS: Hypoaeration and portable technique. Cardiomediastinal contours are likely within normal range allowing for this. No common airspace opacity, pleural effusion, or pneumothorax. No acute osseous finding.  IMPRESSION: Hypoaeration.  No acute process identified.   Electronically Signed   By: Jearld Lesch M.D.   On: 10/03/2013 01:27   Dg Femur Left  10/03/2013   CLINICAL DATA:  Fall, leg pain  EXAM: LEFT FEMUR - 2 VIEW  COMPARISON:  None.  FINDINGS: Comminuted predominantly oblique fracture through the mid and distal left femoral shaft. There is medial displacement and angulation of the distal component. Femoral head grossly remains seated within the acetabulum. The inferior margin of the fracture extends to the patellofemoral articulation anteriorly. No definite extension into the tibial femoral articulation. Small joint effusion.  IMPRESSION: Displaced complex fracture of the left femur as above.   Electronically Signed   By: Jearld Lesch M.D.   On: 10/03/2013 01:26   Ct Angio Chest Pe W/cm &/or Wo Cm  10/03/2013   CLINICAL DATA:  Leg swelling  EXAM: CT ANGIOGRAPHY CHEST WITH CONTRAST  TECHNIQUE: Multidetector CT imaging of the chest was performed using the standard protocol during bolus administration of intravenous contrast. Multiplanar CT image reconstructions including MIPs were obtained to evaluate the vascular anatomy.  CONTRAST:  80mL OMNIPAQUE IOHEXOL 350  MG/ML SOLN  COMPARISON:  None.  FINDINGS: Contrast bolus timing is not optimized to evaluate for pulmonary embolism. No large/central filling defect. The lobar and more peripheral branches are nondiagnostic.  Heart size upper normal to mildly enlarged. Normal caliber aorta. No overt pleural or pericardial effusion. No lymphadenopathy. Limited upper abdominal images show hepatic steatosis.  Degraded by respiratory motion. Ground-glass opacity and mosaic attenuation, favored to reflect atelectasis/areas of air trapping. No confluent airspace opacity. No pneumothorax.  No acute osseous finding.  Review of the MIP images confirms the above findings.  IMPRESSION: Suboptimal contrast bolus timing. No central pulmonary embolism. Lobar or more peripheral branches are nondiagnostic.  Degraded by respiratory motion/expiratory phase. Mosaic attenuation suggests areas of air trapping and atelectasis.  Hepatic steatosis.   Electronically Signed   By:  Jearld Lesch M.D.   On: 10/03/2013 03:11    EKG: Independently reviewed. sinus tachycardia.  Assessment/Plan Principal Problem:   Closed displaced oblique fracture of shaft of left femur Active Problems:   Hypothyroidism   Diabetes   1. Closed displaced oblique fracture of shaft of left femur The patient is presenting with a closed displaced oblique fracture of the shaft of the left femur after a mechanical fall. At present neurovascular examination is intact of the left leg. Other than sinus tachycardia she is hemodynamically stable. Orthopedic has been consulted who will be seeing the patient. Patient will be kept n.p.o. for possible procedure. IV pain medications Dilaudid and IV Zofran will be given as needed. Patient's leg is currently In Buck's traction.  2. Urinary retention Patient is unable to walk on her own and has 10 cc of urine output on bedpan, 4 attempt to insert a Foley catheter were not successful, I want her to urology who will be seeing  the patient, appreciated input.  3. Hypothyroidism Continue Synthroid  4.Diabetes mellitus   wheezing the patient on sliding scale and holding Amaryl  5. Hypoxia Patient is requiring 2 L of oxygen to maintain adequate saturation at present. She had undergone a CT scan of the chest which is not showing any pulmonary embolism. More likely possible etiology is medication induced respiratory depression due to fentanyl or atelectasis or obesity hypoventilation syndrome. Continue to monitor. Incentive spirometry.  Consults: Orthopedic and urology   DVT Prophylaxis: subcutaneous Heparin Nutrition: N.p.o.   Code Status: Full   Family Communication: Mother and father  was present at bedside, opportunity was given to ask question and all questions were answered satisfactorily at the time of interview. Disposition: Admitted to inpatient in telemetry unit.  Author: Lynden Oxford, MD [facility] Pager: [phone] 10/03/2013, 6:32 AM    If 7PM-7AM, please contact night-coverage [email or web address] Password [email or web address]

## 2013-10-03 NOTE — Progress Notes (Signed)
Inpatient Diabetes Program Recommendations  AACE/ADA: New Consensus Statement on Inpatient Glycemic Control (2013)  Target Ranges:  Prepandial:   less than 140 mg/dL      Peak postprandial:   less than 180 mg/dL (1-2 hours)      Critically ill patients:  140 - 180 mg/dL  Results for [name], [name] (MRN [id number]) as of 10/03/2013 12:35  Ref. Range 10/03/2013 06:14  Glucose-Capillary Latest Range: 70-99 mg/dL 629 (H)    Inpatient Diabetes Program Recommendations Insulin - Basal: consider adding basal insulin  HgbA1C: order to assess prehospital glucose control Thank you  Piedad Climes BSN, RN,CDE Inpatient Diabetes Coordinator [phone] (team pager)

## 2013-10-03 NOTE — ED Notes (Signed)
Pts head of bed elevated, pt states sob mildly relieve at this angle.

## 2013-10-03 NOTE — ED Notes (Addendum)
Pt placed in gown. Pt stated urgent need to use the restroom. Verbal order received for foley due to patients injury and weight.

## 2013-10-03 NOTE — Consult Note (Signed)
Reason for Consult:eft femur fracture Referring Physician: Paris Lore   MD  Alexandra Kelley is an 32 y.o. female.  HPI: 32 yo female was getting up from kitchen chair when she had sudden pain with femur fracture left.   Past Medical History  Diagnosis Date  . Diabetes mellitus without complication   . Thyroid disease     Past Surgical History  Procedure Laterality Date  . Tubes in ears    . Back surgery      History reviewed. No pertinent family history.  Social History:  reports that she has never smoked. She does not have any smokeless tobacco history on file. She reports that she does not drink alcohol or use illicit drugs.  Allergies:  Allergies  Allergen Reactions  . Ceclor [Cefaclor]     Rash  . Penicillins Rash    Medications: I have reviewed the patient's current medications.  Results for orders placed during the hospital encounter of 10/02/13 (from the past 48 hour(s))  CBC WITH DIFFERENTIAL     Status: Abnormal   Collection Time    10/03/13 12:14 AM      Result Value Range   WBC 16.5 (*) 4.0 - 10.5 K/uL   RBC 4.67  3.87 - 5.11 MIL/uL   Hemoglobin 14.3  12.0 - 15.0 g/dL   HCT 69.6  29.5 - 28.4 %   MCV 89.7  78.0 - 100.0 fL   MCH 30.6  26.0 - 34.0 pg   MCHC 34.1  30.0 - 36.0 g/dL   RDW 13.2  44.0 - 10.2 %   Platelets 246  150 - 400 K/uL   Neutrophils Relative % 85 (*) 43 - 77 %   Neutro Abs 14.0 (*) 1.7 - 7.7 K/uL   Lymphocytes Relative 10 (*) 12 - 46 %   Lymphs Abs 1.7  0.7 - 4.0 K/uL   Monocytes Relative 4  3 - 12 %   Monocytes Absolute 0.7  0.1 - 1.0 K/uL   Eosinophils Relative 0  0 - 5 %   Eosinophils Absolute 0.0  0.0 - 0.7 K/uL   Basophils Relative 0  0 - 1 %   Basophils Absolute 0.0  0.0 - 0.1 K/uL  COMPREHENSIVE METABOLIC PANEL     Status: Abnormal   Collection Time    10/03/13 12:14 AM      Result Value Range   Sodium 137  135 - 145 mEq/L   Potassium 4.7  3.5 - 5.1 mEq/L   Chloride 96  96 - 112 mEq/L   CO2 30  19 - 32 mEq/L    Glucose, Bld 353 (*) 70 - 99 mg/dL   BUN 13  6 - 23 mg/dL   Creatinine, Ser 7.25  0.50 - 1.10 mg/dL   Calcium 9.6  8.4 - 36.6 mg/dL   Total Protein 7.8  6.0 - 8.3 g/dL   Albumin 4.1  3.5 - 5.2 g/dL   AST 33  0 - 37 U/L   ALT 38 (*) 0 - 35 U/L   Alkaline Phosphatase 86  39 - 117 U/L   Total Bilirubin 0.4  0.3 - 1.2 mg/dL   GFR calc non Af Amer >90  >90 mL/min   GFR calc Af Amer >90  >90 mL/min   Comment: (NOTE)     The eGFR has been calculated using the CKD EPI equation.     This calculation has not been validated in all clinical situations.     eGFR's  persistently <90 mL/min signify possible Chronic Kidney     Disease.  PRO B NATRIURETIC PEPTIDE     Status: None   Collection Time    10/03/13 12:14 AM      Result Value Range   Pro B Natriuretic peptide (BNP) 17.7  0 - 125 pg/mL  TSH     Status: Abnormal   Collection Time    10/03/13 12:14 AM      Result Value Range   TSH 5.154 (*) 0.350 - 4.500 uIU/mL   Comment: Performed at [facility]  TROPONIN I     Status: None   Collection Time    10/03/13 12:14 AM      Result Value Range   Troponin I <0.30  <0.30 ng/mL   Comment:            Due to the release kinetics of cTnI,     a negative result within the first hours     of the onset of symptoms does not rule out     myocardial infarction with certainty.     If myocardial infarction is still suspected,     repeat the test at appropriate intervals.  HCG, SERUM, QUALITATIVE     Status: None   Collection Time    10/03/13 12:14 AM      Result Value Range   Preg, Serum NEGATIVE  NEGATIVE   Comment:            THE SENSITIVITY OF THIS     METHODOLOGY IS >10 mIU/mL.  POCT I-STAT 3, BLOOD GAS (G3P V)     Status: Abnormal   Collection Time    10/03/13  1:43 AM      Result Value Range   pH, Ven 7.356 (*) 7.250 - 7.300   pCO2, Ven 53.1 (*) 45.0 - 50.0 mmHg   pO2, Ven 54.0 (*) 30.0 - 45.0 mmHg   Bicarbonate 29.5 (*) 20.0 - 24.0 mEq/L   TCO2 31  0 - 100 mmol/L   O2  Saturation 83.0     Acid-Base Excess 3.0 (*) 0.0 - 2.0 mmol/L   Patient temperature 100.2 F     Collection site IV START     Sample type VENOUS    URINALYSIS, ROUTINE W REFLEX MICROSCOPIC     Status: Abnormal   Collection Time    10/03/13  5:06 AM      Result Value Range   Color, Urine YELLOW  YELLOW   APPearance CLOUDY (*) CLEAR   Specific Gravity, Urine 1.010  1.005 - 1.030   pH 6.0  5.0 - 8.0   Glucose, UA >1000 (*) NEGATIVE mg/dL   Hgb urine dipstick LARGE (*) NEGATIVE   Bilirubin Urine NEGATIVE  NEGATIVE   Ketones, ur 15 (*) NEGATIVE mg/dL   Protein, ur NEGATIVE  NEGATIVE mg/dL   Urobilinogen, UA 0.2  0.0 - 1.0 mg/dL   Nitrite NEGATIVE  NEGATIVE   Leukocytes, UA SMALL (*) NEGATIVE  URINE MICROSCOPIC-ADD ON     Status: Abnormal   Collection Time    10/03/13  5:06 AM      Result Value Range   Squamous Epithelial / LPF FEW (*) RARE   WBC, UA 0-2  <3 WBC/hpf   RBC / HPF 0-2  <3 RBC/hpf   Bacteria, UA RARE  RARE   Urine-Other MICROSCOPIC EXAM PERFORMED ON UNCONCENTRATED URINE    GLUCOSE, CAPILLARY     Status: Abnormal   Collection Time  10/03/13  6:14 AM      Result Value Range   Glucose-Capillary 298 (*) 70 - 99 mg/dL  TYPE AND SCREEN     Status: None   Collection Time    10/03/13  7:45 AM      Result Value Range   ABO/RH(D) O POS     Antibody Screen NEG     Sample Expiration 10/06/2013    ABO/RH     Status: None   Collection Time    10/03/13  7:45 AM      Result Value Range   ABO/RH(D) O POS      Dg Chest 1 View  10/03/2013   CLINICAL DATA:  Fall, leg pain  EXAM: CHEST - 1 VIEW  COMPARISON:  None.  FINDINGS: Hypoaeration and portable technique. Cardiomediastinal contours are likely within normal range allowing for this. No common airspace opacity, pleural effusion, or pneumothorax. No acute osseous finding.  IMPRESSION: Hypoaeration.  No acute process identified.   Electronically Signed   By: Jearld Lesch M.D.   On: 10/03/2013 01:27   Dg Femur  Left  10/03/2013   CLINICAL DATA:  Fall, leg pain  EXAM: LEFT FEMUR - 2 VIEW  COMPARISON:  None.  FINDINGS: Comminuted predominantly oblique fracture through the mid and distal left femoral shaft. There is medial displacement and angulation of the distal component. Femoral head grossly remains seated within the acetabulum. The inferior margin of the fracture extends to the patellofemoral articulation anteriorly. No definite extension into the tibial femoral articulation. Small joint effusion.  IMPRESSION: Displaced complex fracture of the left femur as above.   Electronically Signed   By: Jearld Lesch M.D.   On: 10/03/2013 01:26   Ct Angio Chest Pe W/cm &/or Wo Cm  10/03/2013   CLINICAL DATA:  Leg swelling  EXAM: CT ANGIOGRAPHY CHEST WITH CONTRAST  TECHNIQUE: Multidetector CT imaging of the chest was performed using the standard protocol during bolus administration of intravenous contrast. Multiplanar CT image reconstructions including MIPs were obtained to evaluate the vascular anatomy.  CONTRAST:  80mL OMNIPAQUE IOHEXOL 350 MG/ML SOLN  COMPARISON:  None.  FINDINGS: Contrast bolus timing is not optimized to evaluate for pulmonary embolism. No large/central filling defect. The lobar and more peripheral branches are nondiagnostic.  Heart size upper normal to mildly enlarged. Normal caliber aorta. No overt pleural or pericardial effusion. No lymphadenopathy. Limited upper abdominal images show hepatic steatosis.  Degraded by respiratory motion. Ground-glass opacity and mosaic attenuation, favored to reflect atelectasis/areas of air trapping. No confluent airspace opacity. No pneumothorax.  No acute osseous finding.  Review of the MIP images confirms the above findings.  IMPRESSION: Suboptimal contrast bolus timing. No central pulmonary embolism. Lobar or more peripheral branches are nondiagnostic.  Degraded by respiratory motion/expiratory phase. Mosaic attenuation suggests areas of air trapping and  atelectasis.  Hepatic steatosis.   Electronically Signed   By: Jearld Lesch M.D.   On: 10/03/2013 03:11    Review of Systems  Constitutional: Negative for fever and weight loss.  HENT:       Glasses   Eyes: Negative for blurred vision and photophobia.  Respiratory: Negative for shortness of breath and wheezing.   Cardiovascular: Negative for chest pain and orthopnea.  Genitourinary:       Neg  Neurological: Negative for dizziness and tremors.  Endo/Heme/Allergies: Does not bruise/bleed easily.       Diabetes times about 3 yrs.  Takes oral medication for this  Psychiatric/Behavioral: Negative for depression.  Blood pressure 111/73, pulse 118, temperature 98.3 F (36.8 C), temperature source Oral, resp. rate 17, height 5\' 3"  (1.6 m), weight 152.7 kg (336 lb 10.3 oz), SpO2 98.00%. Physical Exam  Constitutional: She appears well-developed.  Increased BMI   HENT:  Head: Normocephalic and atraumatic.  Eyes: Pupils are equal, round, and reactive to light.  Neck: Normal range of motion.  Cardiovascular: Normal rate.   Respiratory: Effort normal.  GI: Soft.  Increased BMI no tenderness  Musculoskeletal:  Left foot normal sensation intact pulses.   Neurological: She is alert.  Skin: Skin is warm and dry.  Psychiatric: She has a normal mood and affect. Her behavior is normal.    Assessment/Plan: Left closed comminuted femur fracture with extension distally down to patellofemoral joint. Plan plate fixation stabilization. Risks discussed all ? Answered.   Champayne Kocian C 10/03/2013, 12:11 PM

## 2013-10-03 NOTE — ED Notes (Signed)
Unable to get foley will send patient to xray and try to help patient use restroom when she returns. Pt verbalized understanding.

## 2013-10-03 NOTE — ED Notes (Signed)
Attempted Catherization with RN and was unsuccessful

## 2013-10-04 ENCOUNTER — Inpatient Hospital Stay (HOSPITAL_COMMUNITY): Payer: Medicaid Other

## 2013-10-04 DIAGNOSIS — J9620 Acute and chronic respiratory failure, unspecified whether with hypoxia or hypercapnia: Secondary | ICD-10-CM | POA: Insufficient documentation

## 2013-10-04 DIAGNOSIS — R338 Other retention of urine: Secondary | ICD-10-CM

## 2013-10-04 LAB — CBC
Hemoglobin: 12.5 g/dL (ref 12.0–15.0)
MCH: 29.8 pg (ref 26.0–34.0)
MCHC: 32.9 g/dL (ref 30.0–36.0)
MCV: 90.5 fL (ref 78.0–100.0)
RBC: 4.2 MIL/uL (ref 3.87–5.11)

## 2013-10-04 LAB — GLUCOSE, CAPILLARY
Glucose-Capillary: 310 mg/dL — ABNORMAL HIGH (ref 70–99)
Glucose-Capillary: 257 mg/dL — ABNORMAL HIGH (ref 70–99)
Glucose-Capillary: 288 mg/dL — ABNORMAL HIGH (ref 70–99)
Glucose-Capillary: 269 mg/dL — ABNORMAL HIGH (ref 70–99)
Glucose-Capillary: 265 mg/dL — ABNORMAL HIGH (ref 70–99)
Glucose-Capillary: 253 mg/dL — ABNORMAL HIGH (ref 70–99)

## 2013-10-04 LAB — BASIC METABOLIC PANEL
BUN: 6 mg/dL (ref 6–23)
CO2: 29 mEq/L (ref 19–32)
Calcium: 9.1 mg/dL (ref 8.4–10.5)
Creatinine, Ser: 0.55 mg/dL (ref 0.50–1.10)
GFR calc non Af Amer: >90 mL/min (ref >90)
Glucose, Bld: 285 mg/dL — ABNORMAL HIGH (ref 70–99)

## 2013-10-04 LAB — BLOOD GAS, ARTERIAL
Acid-Base Excess: 6.4 mmol/L — ABNORMAL HIGH (ref 0.0–2.0)
O2 Saturation: 95.4 %
Patient temperature: 98.6
TCO2: 34.1 mmol/L (ref 0–100)
pO2, Arterial: 80.9 mmHg (ref 80.0–100.0)

## 2013-10-04 MED ORDER — INSULIN ASPART 100 UNIT/ML ~~LOC~~ SOLN
0.0000 [IU] | Freq: Three times a day (TID) | SUBCUTANEOUS | Status: DC
  Administered 2013-10-04 (×2): 8 [IU] via SUBCUTANEOUS
  Administered 2013-10-05: 15 [IU] via SUBCUTANEOUS
  Administered 2013-10-05: 8 [IU] via SUBCUTANEOUS

## 2013-10-04 MED ORDER — INSULIN ASPART 100 UNIT/ML ~~LOC~~ SOLN
4.0000 [IU] | Freq: Three times a day (TID) | SUBCUTANEOUS | Status: DC
  Administered 2013-10-05 – 2013-10-09 (×13): 4 [IU] via SUBCUTANEOUS

## 2013-10-04 NOTE — Progress Notes (Signed)
Subjective: 1 Day Post-Op Procedure(s) (LRB): INTRAMEDULLARY (IM) RETROGRADE FEMORAL NAILING (Left) Patient reports pain as moderate.  Complains of pain but was sleeping when you stop talking to her.   Objective: Vital signs in last 24 hours: Temp:  [97.8 F (36.6 C)-99.4 F (37.4 C)] 98.7 F (37.1 C) (12/03 0559) Pulse Rate:  [118-139] 125 (12/03 0559) Resp:  [13-24] 18 (12/03 0559) BP: (100-144)/(46-83) 103/59 mmHg (12/03 0559) SpO2:  [87 %-98 %] 96 % (12/03 0559) Weight:  [152.7 kg (336 lb 10.3 oz)] 152.7 kg (336 lb 10.3 oz) (12/02 1100)  Intake/Output from previous day: 12/02 0701 - 12/03 0700 In: 1930 [P.O.:30; I.V.:1900] Out: 4350 [Urine:4200; Blood:150] Intake/Output this shift:     Recent Labs  10/03/13 0014 10/04/13 0355  HGB 14.3 12.5    Recent Labs  10/03/13 0014 10/04/13 0355  WBC 16.5* 14.4*  RBC 4.67 4.20  HCT 41.9 38.0  PLT 246 247    Recent Labs  10/03/13 0014 10/04/13 0355  NA 137 137  K 4.7 4.4  CL 96 97  CO2 30 29  BUN 13 6  CREATININE 0.68 0.55  GLUCOSE 353* 285*  CALCIUM 9.6 9.1   No results found for this basename: LABPT, INR,  in the last 72 hours  Neurologically intact  Assessment/Plan: 1 Day Post-Op Procedure(s) (LRB): INTRAMEDULLARY (IM) RETROGRADE FEMORAL NAILING (Left) Up with therapy  50 % WB.  Likely will be transfers due to patients weight , etc.   Gregory Barrick C 10/04/2013, 8:07 AM

## 2013-10-04 NOTE — Progress Notes (Signed)
PT Cancellation Note  Patient Details Name: Alexandra Kelley MRN: [id number] DOB: 12-Dec-1980   Cancelled Treatment:    Reason Eval/Treat Not Completed: Pain limiting ability to participate (pt c/o 10/10 pain, pain meds requested, will reattempt later today)   Tamala Ser 10/04/2013, 10:19 AM [phone]

## 2013-10-04 NOTE — Evaluation (Addendum)
Physical Therapy Evaluation Patient Details Name: Alexandra Kelley MRN: [id number] DOB: August 05, 1981 Today's Date: 10/04/2013 Time: 9528-4132 PT Time Calculation (min): 48 min  PT Assessment / Plan / Recommendation History of Present Illness  IM nail L femur 2* fx  Clinical Impression  **Pt admitted with L femur fx, s/p IM nail**. Pt currently with functional limitations due to the deficits listed below (see PT Problem List).  Pt will benefit from skilled PT to increase their independence and safety with mobility to allow discharge to the venue listed below.   *    PT Assessment  Patient needs continued PT services    Follow Up Recommendations  SNF    Does the patient have the potential to tolerate intense rehabilitation      Barriers to Discharge Decreased caregiver support mother works full time, dad drives school bus; family open to ST-SNF if needed but prefer home, depending on progress    Equipment Recommendations  Rolling walker with 5" wheels;3in1 (PT);Wheelchair (measurements PT)    Recommendations for Other Services     Frequency Min 5X/week    Precautions / Restrictions Precautions Precautions: Fall Restrictions Weight Bearing Restrictions: Yes LLE Weight Bearing: Partial weight bearing LLE Partial Weight Bearing Percentage or Pounds: 50%   Pertinent Vitals/Pain **5/10 LLE at rest, 10/10 with activity Premedicated, ice applied  SaO2 75% on RA after bed to chair transfer, HR 130 RN notified SaO2 up to 96% on 3L O2*      Mobility  Bed Mobility Bed Mobility: Supine to Sit Supine to Sit: HOB elevated;1: +2 Total assist Supine to Sit: Patient Percentage: 60% Details for Bed Mobility Assistance: HOB up 75*, assist to support LLE and to elevate trunk, heavy use of BUEs Transfers Transfers: Sit to Stand;Stand to Sit;Stand Pivot Transfers Sit to Stand: 1: +2 Total assist;From bed Sit to Stand: Patient Percentage: 80% Stand to Sit: 1: +2 Total assist;To  chair/3-in-1 Stand to Sit: Patient Percentage: 80% Stand Pivot Transfers: 1: +2 Total assist Stand Pivot Transfers: Patient Percentage: 80% Details for Transfer Assistance: +2 for safety; Increased time, pt took frequent rest breaks 2* fatigue Ambulation/Gait Ambulation/Gait Assistance: Not tested (comment)    Exercises General Exercises - Lower Extremity Ankle Circles/Pumps: AROM;Both;10 reps;Seated Long Arc Quad: AAROM;Left;10 reps;Supine Heel Slides: AAROM;Left;10 reps;Supine Hip ABduction/ADduction: AAROM;Left;10 reps;Supine   PT Diagnosis: Difficulty walking;Generalized weakness;Acute pain  PT Problem List: Decreased strength;Decreased activity tolerance;Pain;Decreased mobility;Cardiopulmonary status limiting activity;Obesity PT Treatment Interventions: Gait training;DME instruction;Therapeutic activities;Therapeutic exercise;Functional mobility training;Stair training;Patient/family education     PT Goals(Current goals can be found in the care plan section) Acute Rehab PT Goals Patient Stated Goal: to walk PT Goal Formulation: With patient/family Time For Goal Achievement: 10/18/13 Potential to Achieve Goals: Good  Visit Information  Last PT Received On: 10/04/13 Assistance Needed: +2 Reason Eval/Treat Not Completed: Pain limiting ability to participate (pt c/o 10/10 pain, pain meds requested, will reattempt later today) History of Present Illness: IM nail L femur 2* fx       Prior Functioning  Home Living Family/patient expects to be discharged to:: Private residence Living Arrangements: Parent Available Help at Discharge: Family Home Access: Stairs to enter Secretary/administrator of Steps: 2 Home Layout: One level Home Equipment: None Prior Function Level of Independence: Independent    Cognition  Cognition Arousal/Alertness: Awake/alert Behavior During Therapy: WFL for tasks assessed/performed Overall Cognitive Status: Within Functional Limits for tasks  assessed    Extremity/Trunk Assessment Upper Extremity Assessment Upper Extremity Assessment: Overall WFL for tasks  assessed Lower Extremity Assessment Lower Extremity Assessment: LLE deficits/detail LLE Deficits / Details: knee ext 2/5, ankle WNL, hip limited by pain but able to tolerate minimal AAROM LLE: Unable to fully assess due to pain Cervical / Trunk Assessment Cervical / Trunk Assessment: Normal   Balance Balance Balance Assessed: Yes Static Sitting Balance Static Sitting - Balance Support: Bilateral upper extremity supported;Feet unsupported Static Sitting - Level of Assistance: 5: Stand by assistance Static Sitting - Comment/# of Minutes: 10  End of Session PT - End of Session Activity Tolerance: Patient limited by pain;Patient limited by fatigue Patient left: in chair;with call bell/phone within reach;with family/visitor present Nurse Communication: Mobility status  GP     Ralene Bathe Kistler 10/04/2013, 12:26 PM [phone]

## 2013-10-04 NOTE — Progress Notes (Signed)
Inpatient Diabetes Program Recommendations  AACE/ADA: New Consensus Statement on Inpatient Glycemic Control (2013)  Target Ranges:  Prepandial:   less than 140 mg/dL      Peak postprandial:   less than 180 mg/dL (1-2 hours)      Critically ill patients:  140 - 180 mg/dL  Results for NELL, GALES (MRN [id number]) as of 10/04/2013 14:16  Ref. Range 10/03/2013 19:58 10/04/2013 00:08 10/04/2013 06:05 10/04/2013 08:43 10/04/2013 09:29  Glucose-Capillary Latest Range: 70-99 mg/dL 782 (H) 956 (H) 213 (H) 257 (H) 288 (H)   Inpatient Diabetes Program Recommendations Insulin - Basal: Add Lantus or Levemir 25 units  HgbA1C: order to assess prehospital glucose control Noted addition of Novolog meal coverage 4 units Thank you  Piedad Climes BSN, RN,CDE Inpatient Diabetes Coordinator [phone] (team pager)

## 2013-10-04 NOTE — Clinical Social Work Note (Signed)
CSW attempted to speak with the pt regarding SNF placement recommendation from PT/OT. Pt was working with PT at the time. CSW to follow-up on 10/05/2013 to discuss SNF placement options. CSW to also speak to pt regarding insurance. Per CSW Chiropodist, pt possibly not eligible for SNF placement and not eligible for PT/OT services at [facility] CSW to speak with MD and [facility] regarding information above.  Darlyn Chamber, LCSWA Clinical Social Worker [phone]

## 2013-10-04 NOTE — Progress Notes (Signed)
Physical Therapy Treatment Patient Details Name: Alexandra Kelley MRN: [id number] DOB: [birth year] Today's Date: 10/04/2013 Time: 1324-4010 PT Time Calculation (min): 21 min   10/04/13 1600  PT Visit Information  Last PT Received On 10/04/13  Assistance Needed +2  History of Present Illness IM nail L femur 2* fx  PT Time Calculation  PT Start Time 1538  PT Stop Time 1559  PT Time Calculation (min) 21 min  Subjective Data  Patient Stated Goal do dishes  Precautions  Precautions Fall  Restrictions  Weight Bearing Restrictions Yes  LLE Weight Bearing PWB  LLE Partial Weight Bearing Percentage or Pounds 50%  Cognition  Arousal/Alertness Awake/alert  Behavior During Therapy WFL for tasks assessed/performed  Overall Cognitive Status Within Functional Limits for tasks assessed  Bed Mobility  Bed Mobility Sit to Supine;Scooting to HOB  Sit to Supine 3: Mod assist  Scooting to HOB 1: +2 Total assist;With rail  Scooting to [facility]: Patient Percentage 60%  Details for Bed Mobility Assistance Assist for LE elevation, and reverse inclined bed to scoot to [facility]  Transfers  Transfers Sit to Stand;Stand to Sit;Stand Pivot Transfers  Sit to Stand 1: +2 Total assist  Sit to Stand: Patient Percentage 70%  Stand to Sit 4: Min assist;With upper extremity assist;To chair/3-in-1  Stand Pivot Transfers 1: +2 Total assist  Stand Pivot Transfers: Patient Percentage 80%  Details for Transfer Assistance Increased assist required secondary to fatigue from sitting in chair, +2 Total A (80%) for stand pivot transfer. Cues for hand placement.  Static Sitting Balance  Static Sitting - Balance Support Bilateral upper extremity supported;Feet unsupported  Static Sitting - Level of Assistance 5: Stand by assistance  Static Sitting - Comment/# of Minutes 3 minutes EOB  PT - Assessment/Plan  PT Plan Current plan remains appropriate  PT Frequency Min 5X/week  Follow Up Recommendations SNF  PT equipment Rolling  walker with 5" wheels;3in1 (PT);Wheelchair (measurements PT)  PT Goal Progression  Progress towards PT goals Progressing toward goals  Acute Rehab PT Goals  PT Goal Formulation With patient/family  Time For Goal Achievement 10/18/13  Potential to Achieve Goals Good  PT General Charges  $$ ACUTE PT VISIT 1 Procedure  PT Treatments  $Therapeutic Activity 8-22 mins   Charlotte Crumb, PT DPT  [phone]

## 2013-10-04 NOTE — Op Note (Signed)
Alexandra Kelley, Alexandra Kelley             ACCOUNT NO.:  [id number]  MEDICAL RECORD NO.:  [id number]  LOCATION:  6N28C                        FACILITY:  [facility]  PHYSICIAN:  Jerardo Costabile C. Ophelia Charter, M.D.    DATE OF BIRTH:  Oct 29, 1981  DATE OF PROCEDURE:  10/03/2013 DATE OF DISCHARGE:                              OPERATIVE REPORT   PREOPERATIVE DIAGNOSIS:  Left comminuted femoral shaft fracture with distal supracondylar extension.  POSTOPERATIVE DIAGNOSIS:  Left comminuted femoral shaft fracture with distal supracondylar extension.  PROCEDURE:  Retrograde femoral nail with interlocks.  SURGEON:  Kyreese Chio C. Ophelia Charter, MD  ANESTHESIA:  General plus Marcaine skin local.  EBL:  150.  DESCRIPTION OF PROCEDURE:  After induction of general anesthesia, orotracheal intubation, the panniculus was taped up and across using tincture of benzoin, care taken not to blister the skin.  This was required in order to expose the proximal portion of the thigh for prepping and draping.  The foot was included in the DuraPrep, 3 g of Ancef was given prophylactically.  Standard prep and draping, split sheets, drapes, impervious stockinette, Coban, sterile skin marker, Betadine, Steri-Drape were used.  Time-out procedure was completed. Triangle with extra pads were applied.  C-arm was draped and brought in. Incision was made over the patella which was split in the midline.  The patient a had large medial femoral condyle, had short stature and learning disability.  Some bleeders were coagulated in the retropatellar tendon fat pad.  Condyle was palpated.  Starter spot was selected, checked under fluoroscopy AP and lateral, overreamed, and then using the finger retractor.  Small bend on the medial tip of the wire with some difficulty, switching back fluoro time about 10 minutes.  Finally, the proximal femur which was only 9 mm internal diameter measured by x-ray was cannulated and passed up to subtrochanteric region, confirmed AP  and lateral.  Holding traction, reaming was started which was difficult with the 8 mm end-reamer, progressing up to 10 mm for a 9 mm rod selection. It was measured, and a 44 x 9 mm [employer] [place] nail was selected, tapped, and all 4 screws were in good position.  The nail was slightly countersunk.  Four distal interlocks were placed bicortical, checked under fluoroscopy.  End-cap was locked down.  Fracture site had been checked.  It was out to length.  No overdistraction, and then triangle was removed, and the proximal screw was placed with freehand technique, which due to her status was almost at the groin crease.  A 34 was selected.  It was slightly long, however, had good tight fit, had bicortical fixation, and due to the body habitus, a lateral picture was not able to be well visualized with depth gauge for appropriate choice. I poked 2-3 extra threads more than normal, but with patient's body habitus, this was not felt to cause any problems.  All areas were irrigated including the knee.  Patellar tendon reapproximated with interrupted 0 Vicryl, 2-0 Vicryl subcutaneous tissue, 4-0 Vicryl subcuticular closure, tincture of benzoin, Steri-Strips, 4-0 nylon in all stab incisions made for interlocks including proximal thigh, postoperative dressing, 4x4s, tape, and transferred to recovery in stable condition.  Instrument count and needle count were  correct.     Theodore Rahrig C. Ophelia Charter, M.D.     MCY/MEDQ  D:  10/03/2013  T:  10/04/2013  Job:  [id number]

## 2013-10-04 NOTE — Evaluation (Signed)
Occupational Therapy Evaluation Patient Details Name: Alexandra Kelley MRN: [id number] DOB: 10-27-1981 Today's Date: 10/04/2013 Time: 7829-5621 OT Time Calculation (min): 40 min  OT Assessment / Plan / Recommendation History of present illness IM nail L femur 2* fx   Clinical Impression   Pt presents with below problem list. Pt will benefit from acute OT to increase independence prior to d/c.     OT Assessment  Patient needs continued OT Services    Follow Up Recommendations  SNF;Supervision/Assistance - 24 hour    Barriers to Discharge      Equipment Recommendations  Other (comment) (large 3 in 1 bedside commode; father states he will look for tub bench)    Recommendations for Other Services    Frequency  Min 2X/week    Precautions / Restrictions Precautions Precautions: Fall Restrictions Weight Bearing Restrictions: Yes LLE Weight Bearing: Partial weight bearing LLE Partial Weight Bearing Percentage or Pounds: 50%   Pertinent Vitals/Pain Pain 10/10 at beginning of session. Increased activity. O2 dropped in 70's on O2 during session and HR in 150's during session as well. Educated on deep breathing. Nurse notified.     ADL  Grooming: Performed;Wash/dry face Where Assessed - Grooming: Unsupported sitting Upper Body Dressing: Set up;Supervision/safety Where Assessed - Upper Body Dressing: Unsupported sitting Lower Body Dressing: Maximal assistance Where Assessed - Lower Body Dressing: Supported sit to stand Toilet Transfer: Minimal assistance;+2 Total assistance Toilet Transfer: Patient Percentage: 80% Toilet Transfer Method: Sit to stand;Stand pivot (Min A-sit to stand and +2 Total A-stand pivot) Toilet Transfer Equipment: Bedside commode Equipment Used: Gait belt;Reacher;Long-handled sponge;Long-handled shoe horn;Rolling walker;Sock aid;Other (comment) (O2) Transfers/Ambulation Related to ADLs: +2 Total A (80%) for stand pivot transfer-assist with RLE. Min A for sit  <> stand transfer. ADL Comments: Educated on AE for LB ADLs. Pt practiced with sockaid. Educated on DME. Pt taking increased time with transfers-practiced stand pivot transfer to [facility].  Practiced transferring from chair to [facility].  Spoke with pt and her father about d/c plans.    OT Diagnosis: Acute pain  OT Problem List: Decreased strength;Decreased activity tolerance;Impaired balance (sitting and/or standing);Decreased range of motion;Decreased knowledge of use of DME or AE;Decreased knowledge of precautions;Obesity;Pain;Cardiopulmonary status limiting activity OT Treatment Interventions: Self-care/ADL training;Therapeutic exercise;DME and/or AE instruction;Therapeutic activities;Patient/family education;Balance training   OT Goals(Current goals can be found in the care plan section) Acute Rehab OT Goals Patient Stated Goal: do dishes OT Goal Formulation: With patient Time For Goal Achievement: 10/11/13 Potential to Achieve Goals: Good ADL Goals Pt Will Perform Grooming: with set-up;standing;with supervision Pt Will Perform Lower Body Bathing: with set-up;with supervision;with adaptive equipment;sit to/from stand Pt Will Perform Lower Body Dressing: with set-up;with supervision;with adaptive equipment;sit to/from stand Pt Will Transfer to Toilet: with supervision;ambulating (3 in 1 over commode) Pt Will Perform Toileting - Clothing Manipulation and hygiene: with supervision;sit to/from stand Pt Will Perform Tub/Shower Transfer: Tub transfer;with supervision;ambulating;rolling walker (tub equipment tbd)  Visit Information  Last OT Received On: 10/04/13 Assistance Needed: +2 History of Present Illness: IM nail L femur 2* fx       Prior Functioning     Home Living Family/patient expects to be discharged to:: Private residence Living Arrangements: Parent Available Help at Discharge: Family Home Access: Stairs to enter Secretary/administrator of Steps: 2 Home Layout: One level Home  Equipment: None Prior Function Level of Independence: Needs assistance ADL's / Homemaking Assistance Needed: assistance with LB bathing Communication Communication: No difficulties         Vision/Perception  Cognition  Cognition Arousal/Alertness: Awake/alert Behavior During Therapy: WFL for tasks assessed/performed Overall Cognitive Status: Within Functional Limits for tasks assessed    Extremity/Trunk Assessment Upper Extremity Assessment Upper Extremity Assessment: Overall WFL for tasks assessed Lower Extremity Assessment Lower Extremity Assessment: Defer to PT evaluation LLE Deficits / Details: knee ext 2/5, ankle WNL, hip limited by pain but able to tolerate minimal AAROM LLE: Unable to fully assess due to pain Cervical / Trunk Assessment Cervical / Trunk Assessment: Normal     Mobility Bed Mobility Bed Mobility: Not assessed Transfers Transfers: Sit to Stand;Stand to Sit Sit to Stand: 4: Min assist;With upper extremity assist;From chair/3-in-1 Stand to Sit: 4: Min assist;With upper extremity assist;To chair/3-in-1 Details for Transfer Assistance: +2 Total A (80%) for stand pivot transfer. Cues for hand placement.           End of Session OT - End of Session Equipment Utilized During Treatment: Gait belt;Rolling walker;Oxygen Activity Tolerance: Patient limited by pain Patient left: in chair;with call bell/phone within reach;with family/visitor present Nurse Communication: Mobility status;Other (comment) (HR and O2; need larger BSC)  GO     Earlie Raveling OTR/L [phone] 10/04/2013, 3:53 PM

## 2013-10-04 NOTE — Progress Notes (Signed)
[facility] PROGRESS NOTE  PHILLIS THACKERAY FAO:[id number] DOB: 1981-08-07 DOA: 10/02/2013 PCP: Colette Ribas, MD  Assessment/Plan: Closed displaced oblique fracture of shaft of left femur -Status post surgery 10/03/13. -Plan as per orthopedics. -SNF as recommended by PT.  Acute urinary retention -Foley catheter in place. -Appreciate urology recommendations. -Continue tamsulosin. -Voiding trials once able to ambulate.  Hypoxemia -Sats drop into the 80s on room air even at rest. -CT chest on 10/03/13 was negative for PE. -Will check chest x-ray and ABG. -Suspect related to obstructive sleep apnea/obesity hypoventilation syndrome and will benefit from outpatient sleep study after discharge. -Continue oxygen supplementation for now.  Hypothyroidism -Continue Synthroid.   Diabetes mellitus -CBGs remained elevated. -May need to start long acting insulin soon.  Sinus tachycardia -Suspect related to pain. -No need for acute intervention.   Code Status: Full code Family Communication: Father at bedside updated on plan of care  Disposition Plan: Likely SNF once medically stable.   Consultants:  Orthopedics   Antibiotics:  None   Subjective: Mild to moderate leg pain. No other complaints. Does not feel short of breath, no chest pain.  Objective: Filed Vitals:   10/04/13 0101 10/04/13 0559 10/04/13 1216 10/04/13 1217  BP: 110/50 103/59    Pulse: 138 125 130   Temp: 99.3 F (37.4 C) 98.7 F (37.1 C)    TempSrc: Oral     Resp: 18 18    Height:      Weight:      SpO2: 94% 96% 75% 96%    Intake/Output Summary (Last 24 hours) at 10/04/13 1417 Last data filed at 10/04/13 1048  Gross per 24 hour  Intake   2050 ml  Output   2550 ml  Net   -500 ml   Filed Weights   10/03/13 0545 10/03/13 1100  Weight: 152.7 kg (336 lb 10.3 oz) 152.7 kg (336 lb 10.3 oz)    Exam:   General:  Alert, awake, oriented x3, obese  Cardiovascular: Tachycardic,  regular rhythm, no murmurs, rubs or gallops  Respiratory: Clear to auscultation bilaterally  Abdomen: Obese, soft, nontender, nondistended, positive bowel sounds  Extremities: Trace bilateral pitting edema   Neurologic:  Nonfocal  Data Reviewed: Basic Metabolic Panel:  Recent Labs Lab 10/03/13 0014 10/04/13 0355  NA 137 137  K 4.7 4.4  CL 96 97  CO2 30 29  GLUCOSE 353* 285*  BUN 13 6  CREATININE 0.68 0.55  CALCIUM 9.6 9.1   Liver Function Tests:  Recent Labs Lab 10/03/13 0014  AST 33  ALT 38*  ALKPHOS 86  BILITOT 0.4  PROT 7.8  ALBUMIN 4.1   No results found for this basename: LIPASE, AMYLASE,  in the last 168 hours No results found for this basename: AMMONIA,  in the last 168 hours CBC:  Recent Labs Lab 10/03/13 0014 10/04/13 0355  WBC 16.5* 14.4*  NEUTROABS 14.0*  --   HGB 14.3 12.5  HCT 41.9 38.0  MCV 89.7 90.5  PLT 246 247   Cardiac Enzymes:  Recent Labs Lab 10/03/13 0014  TROPONINI <0.30   BNP (last 3 results)  Recent Labs  10/03/13 0014  PROBNP 17.7   CBG:  Recent Labs Lab 10/03/13 1958 10/04/13 0008 10/04/13 0605 10/04/13 0843 10/04/13 0929  GLUCAP 242* 261* 310* 257* 288*    Recent Results (from the past 240 hour(s))  MRSA PCR SCREENING     Status: None   Collection Time    10/03/13  2:57 PM  Result Value Range Status   MRSA by PCR NEGATIVE  NEGATIVE Final   Comment:            The GeneXpert MRSA Assay (FDA     approved for NASAL specimens     only), is one component of a     comprehensive MRSA colonization     surveillance program. It is not     intended to diagnose MRSA     infection nor to guide or     monitor treatment for     MRSA infections.     Studies: Dg Chest 1 View  10/03/2013   CLINICAL DATA:  Fall, leg pain  EXAM: CHEST - 1 VIEW  COMPARISON:  None.  FINDINGS: Hypoaeration and portable technique. Cardiomediastinal contours are likely within normal range allowing for this. No common airspace  opacity, pleural effusion, or pneumothorax. No acute osseous finding.  IMPRESSION: Hypoaeration.  No acute process identified.   Electronically Signed   By: Jearld Lesch M.D.   On: 10/03/2013 01:27   Dg Femur Left  10/03/2013   CLINICAL DATA:  Left femur fixation.  EXAM: DG C-ARM 61-120 MIN; LEFT FEMUR - 2 VIEW  COMPARISON:  Plain film of earlier today.  FINDINGS: A total of 5 intraoperative images. These demonstrate placement of an intra medullary rod with 1 proximal and 4 distal locking screws across the previously described femoral shaft fracture. Improved alignment with minimal residual displacement remaining. No acute hardware complication.  IMPRESSION: Intraoperative imaging of femoral fixation.   Electronically Signed   By: Jeronimo Greaves M.D.   On: 10/03/2013 19:57   Dg Femur Left  10/03/2013   CLINICAL DATA:  Fall, leg pain  EXAM: LEFT FEMUR - 2 VIEW  COMPARISON:  None.  FINDINGS: Comminuted predominantly oblique fracture through the mid and distal left femoral shaft. There is medial displacement and angulation of the distal component. Femoral head grossly remains seated within the acetabulum. The inferior margin of the fracture extends to the patellofemoral articulation anteriorly. No definite extension into the tibial femoral articulation. Small joint effusion.  IMPRESSION: Displaced complex fracture of the left femur as above.   Electronically Signed   By: Jearld Lesch M.D.   On: 10/03/2013 01:26   Ct Angio Chest Pe W/cm &/or Wo Cm  10/03/2013   CLINICAL DATA:  Leg swelling  EXAM: CT ANGIOGRAPHY CHEST WITH CONTRAST  TECHNIQUE: Multidetector CT imaging of the chest was performed using the standard protocol during bolus administration of intravenous contrast. Multiplanar CT image reconstructions including MIPs were obtained to evaluate the vascular anatomy.  CONTRAST:  80mL OMNIPAQUE IOHEXOL 350 MG/ML SOLN  COMPARISON:  None.  FINDINGS: Contrast bolus timing is not optimized to evaluate for  pulmonary embolism. No large/central filling defect. The lobar and more peripheral branches are nondiagnostic.  Heart size upper normal to mildly enlarged. Normal caliber aorta. No overt pleural or pericardial effusion. No lymphadenopathy. Limited upper abdominal images show hepatic steatosis.  Degraded by respiratory motion. Ground-glass opacity and mosaic attenuation, favored to reflect atelectasis/areas of air trapping. No confluent airspace opacity. No pneumothorax.  No acute osseous finding.  Review of the MIP images confirms the above findings.  IMPRESSION: Suboptimal contrast bolus timing. No central pulmonary embolism. Lobar or more peripheral branches are nondiagnostic.  Degraded by respiratory motion/expiratory phase. Mosaic attenuation suggests areas of air trapping and atelectasis.  Hepatic steatosis.   Electronically Signed   By: Jearld Lesch M.D.   On: 10/03/2013 03:11  Dg C-arm 61-120 Min  10/03/2013   CLINICAL DATA:  Left femur fixation.  EXAM: DG C-ARM 61-120 MIN; LEFT FEMUR - 2 VIEW  COMPARISON:  Plain film of earlier today.  FINDINGS: A total of 5 intraoperative images. These demonstrate placement of an intra medullary rod with 1 proximal and 4 distal locking screws across the previously described femoral shaft fracture. Improved alignment with minimal residual displacement remaining. No acute hardware complication.  IMPRESSION: Intraoperative imaging of femoral fixation.   Electronically Signed   By: Jeronimo Greaves M.D.   On: 10/03/2013 19:57    Scheduled Meds: . aspirin EC  325 mg Oral Q breakfast  . docusate sodium  100 mg Oral BID  . insulin aspart  0-15 Units Subcutaneous TID WC  . insulin aspart  4 Units Subcutaneous TID WC   Continuous Infusions: . sodium chloride 100 mL/hr at 10/03/13 2236    Principal Problem:   Closed displaced oblique fracture of shaft of left femur Active Problems:   Hypothyroidism   Diabetes   Acute urinary retention   Hypoxemia    Time  spent: 45 minutes. Greater than 50% of this time was spent in direct contact with the patient coordinating care.    Chaya Jan  [facility] Pager [phone]  If 7PM-7AM, please contact night-coverage at [email or web address], password [facility] 10/04/2013, 2:17 PM  LOS: 2 days

## 2013-10-05 LAB — BASIC METABOLIC PANEL
BUN: 4 mg/dL — ABNORMAL LOW (ref 6–23)
BUN: 11 mg/dL (ref 6–23)
CO2: 33 mEq/L — ABNORMAL HIGH (ref 19–32)
Calcium: 9.2 mg/dL (ref 8.4–10.5)
Calcium: 9.7 mg/dL (ref 8.4–10.5)
Chloride: 92 mEq/L — ABNORMAL LOW (ref 96–112)
Chloride: 86 mEq/L — ABNORMAL LOW (ref 96–112)
Creatinine, Ser: 0.64 mg/dL (ref 0.50–1.10)
Creatinine, Ser: 1.58 mg/dL — ABNORMAL HIGH (ref 0.50–1.10)
GFR calc Af Amer: 50 mL/min — ABNORMAL LOW (ref >90)
GFR calc non Af Amer: 43 mL/min — ABNORMAL LOW (ref >90)
Glucose, Bld: 264 mg/dL — ABNORMAL HIGH (ref 70–99)
Potassium: 4.1 mEq/L (ref 3.5–5.1)
Sodium: 135 mEq/L (ref 135–145)

## 2013-10-05 LAB — CBC
HCT: 36.1 % (ref 36.0–46.0)
MCH: 29.4 pg (ref 26.0–34.0)
MCV: 91.6 fL (ref 78.0–100.0)
Platelets: 217 10*3/uL (ref 150–400)
RBC: 3.94 MIL/uL (ref 3.87–5.11)
RDW: 14.6 % (ref 11.5–15.5)

## 2013-10-05 LAB — BLOOD GAS, ARTERIAL
Acid-Base Excess: 3.4 mmol/L — ABNORMAL HIGH (ref 0.0–2.0)
Acid-base deficit: 0.6 mmol/L (ref 0.0–2.0)
Bicarbonate: 29.1 mEq/L — ABNORMAL HIGH (ref 20.0–24.0)
Drawn by: 277551
Drawn by: 24513
O2 Content: 4 L/min
O2 Saturation: 56.6 %
Patient temperature: 98.6
TCO2: 30.9 mmol/L (ref 0–100)
pCO2 arterial: 61.7 mmHg (ref 35.0–45.0)
pCO2 arterial: 59.6 mmHg (ref 35.0–45.0)
pH, Arterial: 7.31 — ABNORMAL LOW (ref 7.350–7.450)
pO2, Arterial: 35 mmHg — CL (ref 80.0–100.0)
pO2, Arterial: 82.2 mmHg (ref 80.0–100.0)

## 2013-10-05 LAB — GLUCOSE, CAPILLARY
Glucose-Capillary: 261 mg/dL — ABNORMAL HIGH (ref 70–99)
Glucose-Capillary: 482 mg/dL — ABNORMAL HIGH (ref 70–99)
Glucose-Capillary: 405 mg/dL — ABNORMAL HIGH (ref 70–99)

## 2013-10-05 LAB — GLUCOSE, RANDOM: Glucose, Bld: 526 mg/dL — ABNORMAL HIGH (ref 70–99)

## 2013-10-05 MED ORDER — INSULIN DETEMIR 100 UNIT/ML ~~LOC~~ SOLN
10.0000 [IU] | Freq: Every day | SUBCUTANEOUS | Status: DC
  Filled 2013-10-05: qty 0.1

## 2013-10-05 MED ORDER — INSULIN DETEMIR 100 UNIT/ML ~~LOC~~ SOLN
20.0000 [IU] | Freq: Every day | SUBCUTANEOUS | Status: DC
  Administered 2013-10-05 – 2013-10-08 (×4): 20 [IU] via SUBCUTANEOUS
  Filled 2013-10-05 (×5): qty 0.2

## 2013-10-05 MED ORDER — SODIUM CHLORIDE 0.9 % IV SOLN
INTRAVENOUS | Status: DC
  Administered 2013-10-05 – 2013-10-07 (×3): via INTRAVENOUS

## 2013-10-05 MED ORDER — INSULIN ASPART 100 UNIT/ML ~~LOC~~ SOLN
0.0000 [IU] | Freq: Three times a day (TID) | SUBCUTANEOUS | Status: DC
  Administered 2013-10-06: 11 [IU] via SUBCUTANEOUS
  Administered 2013-10-06 (×2): 7 [IU] via SUBCUTANEOUS
  Administered 2013-10-07 (×3): 11 [IU] via SUBCUTANEOUS
  Administered 2013-10-08: 4 [IU] via SUBCUTANEOUS
  Administered 2013-10-08: 7 [IU] via SUBCUTANEOUS
  Administered 2013-10-08: 11 [IU] via SUBCUTANEOUS
  Administered 2013-10-09: 7 [IU] via SUBCUTANEOUS
  Administered 2013-10-09: 4 [IU] via SUBCUTANEOUS

## 2013-10-05 MED ORDER — LEVOTHYROXINE SODIUM 137 MCG PO TABS
137.0000 ug | ORAL_TABLET | Freq: Every day | ORAL | Status: DC
  Administered 2013-10-06 – 2013-10-09 (×4): 137 ug via ORAL
  Filled 2013-10-05 (×5): qty 1

## 2013-10-05 MED ORDER — INSULIN ASPART 100 UNIT/ML ~~LOC~~ SOLN
15.0000 [IU] | Freq: Once | SUBCUTANEOUS | Status: AC
  Administered 2013-10-05: 15 [IU] via SUBCUTANEOUS

## 2013-10-05 MED ORDER — INSULIN ASPART 100 UNIT/ML ~~LOC~~ SOLN
10.0000 [IU] | Freq: Once | SUBCUTANEOUS | Status: AC
  Administered 2013-10-05: 10 [IU] via SUBCUTANEOUS

## 2013-10-05 MED ORDER — LEVOTHYROXINE SODIUM 137 MCG PO TABS
137.0000 ug | ORAL_TABLET | Freq: Once | ORAL | Status: AC
  Administered 2013-10-05: 137 ug via ORAL
  Filled 2013-10-05: qty 1

## 2013-10-05 MED ORDER — PANTOPRAZOLE SODIUM 40 MG PO TBEC
40.0000 mg | DELAYED_RELEASE_TABLET | Freq: Every day | ORAL | Status: DC
  Administered 2013-10-06 – 2013-10-09 (×4): 40 mg via ORAL
  Filled 2013-10-05 (×4): qty 1

## 2013-10-05 NOTE — Progress Notes (Signed)
Inpatient Diabetes Program Recommendations  AACE/ADA: New Consensus Statement on Inpatient Glycemic Control (2013)  Target Ranges:  Prepandial:   less than 140 mg/dL      Peak postprandial:   less than 180 mg/dL (1-2 hours)      Critically ill patients:  140 - 180 mg/dL   If cbg's continue to be elevated now into 300's into 500's If this trend continues, please start IV insulin per GlucoStabilizer until glucose is in normal range for at least 4 hrs followed by a minimum of 20-25 units lantus or levemir.  (Otherwise, please add 20-15 units Lantus or Levemir.)  Inpatient Diabetes Program Recommendations Insulin - Basal: Add Lantus or Levemir 25 units  HgbA1C: order to assess prehospital glucose control  Thank you, Lenor Coffin, RN, CNS, Diabetes Coordinator [phone])

## 2013-10-05 NOTE — Progress Notes (Signed)
CRITICAL VALUE ALERT  Critical value received:  *Arterial Blood Gas result:  pO2 ; pCO2 59.6; pH 7.31;  HCO3 29.1, %O2 Sat 82.2.  Date of notification:  12/04  Time of notification:  2000  Critical value read back:yes  Nurse who received alert:  Leonie Man RN  MD notified (1st page):  K.Schorr  Time of first page:  2010  MD notified (2nd page):  Time of second page:  Responding MD:  K.Schorr  Time MD responded:  2020

## 2013-10-05 NOTE — Progress Notes (Signed)
Pt's CBG = 482 @ 1224. Ordered STAT blood glucose draw. Glucose = 526. Pt. Pale, c/o being "hot, sleepy. MD notified. Orders received for Novolog 15units. Medication administered. Will recheck CBG.

## 2013-10-05 NOTE — Clinical Social Work Psychosocial (Addendum)
°  °  Clinical Social Work Department BRIEF PSYCHOSOCIAL ASSESSMENT 10/05/2013  Patient:  Alexandra Kelley, Alexandra Kelley     Account Number:  [id number]     Admit date:  10/02/2013  Clinical Social Worker:  Sherre Lain  Date/Time:  10/05/2013 02:50 PM  Referred by:  Physician  Date Referred:  10/05/2013 Referred for  SNF Placement   Other Referral:   none.   Interview type:  Family Other interview type:   Pt was asleep, pt's mother was at bedside.    PSYCHOSOCIAL DATA Living Status:  FAMILY Admitted from facility:   Level of care:   Primary support name:   Primary support relationship to patient:  PARENT Degree of support available:   Strong support system. Pt lives with parents.    CURRENT CONCERNS Current Concerns  Post-Acute Placement   Other Concerns:   none.    SOCIAL WORK ASSESSMENT / PLAN CSW attempted to meet with pt at bedside, but pt was asleep. Pt's mother was present at bedside. CSW defined CSW role at [facility]. Pt's mother was agreeable to speaking with CSW. CSW informed pt's mother of PT/OT recommendation for SNF placement. CSW discussed possibility of having to look into facilities not within the surrounding area due to pt's Medicaid insurance. Pt's mother stated that she is agreeable to [facility] search, along with [facility] [place]. CSW to follow-up and assist with discharge planning needs.   Assessment/plan status:  Psychosocial Support/Ongoing Assessment of Needs Other assessment/ plan:   none.   Information/referral to community resources:   SNF placement bed offers.    PATIENTS/FAMILYS RESPONSE TO PLAN OF CARE: Pt's mother was understanding and agreeable to CSW plan of care.

## 2013-10-05 NOTE — Progress Notes (Signed)
Patient's mother asked if patient was receiving synthroid. Medication was not on patient's MAR currently. Dr. Ardyth Harps notified and order was placed to start the next morning. Father of patient was extremely upset and "wondered how this could happen". Dr. Ardyth Harps agreed to meet with father on 12/5 between 8-10 during rounds.  Patient also was hyperglycemic throughout the day, was clammy, somewhat more lethargic than normal, had nausea and was extremely thirsty. Dr. Ardyth Harps ordered BMP, ABG, and increased nighttme dose of levemir to 20 Units from 10. ABG results sent to alvarez. Dr, . Mendera came to bedside to assess patieent. Repeat ABG ordered and CPAP trial was ordered for sleep r/t CO2 retention. Patient found to be hypoxic in the 60's and o2 via nasal cannula aplied at 4 l.   O2 sats incerased to 95%. Patient HR 130, RR 20, and BP 100 sys. Information discussed with oncoming night nurse to wwatch for the ABG and follow MD orders.

## 2013-10-05 NOTE — Progress Notes (Signed)
Call by nurse to assess patient due to increase lethargy and elevated CBG's. On exam patient was AAOX3, no CP and just mild SOB. According to patient and family she doesn't use any CPAP at home or has had sleep study done.  General: Alert, awake, oriented x3, obese  Cardiovascular: Tachycardic, regular rhythm, no murmurs, rubs or gallops  Respiratory: Clear to auscultation bilaterally  Abdomen: Obese, soft, nontender, nondistended, positive bowel sounds  Extremities: Trace bilateral pitting edema bilaterally, no cyanosis Neurologic: Nonfocal  Plan: -will check A1C -start patient on levemir -continue SSI but will change to resistant  -ABG reported to have O2 in the 35 range (O2 sat in the mid 90's on 4L); high concerns to be venous sample -CO2 in the mid 60's -will repeat ABG -CPAP trial to help with elevated CO2 and to decrease risk of hypercapnea narcosis -will follow results and will have a low threshold to move patient to step down.    Kimbra Marcelino [phone]

## 2013-10-05 NOTE — Progress Notes (Signed)
[facility] PROGRESS NOTE  [name]:[id number] DOB: 07-05-1981 DOA: 10/02/2013 PCP: Colette Ribas, MD  Assessment/Plan: Closed displaced oblique fracture of shaft of left femur -Status post surgery 10/03/13. -Plan as per orthopedics. -SNF as recommended by PT.  Acute urinary retention -Foley catheter in place. -Appreciate urology recommendations. -Continue tamsulosin. -Voiding trials once able to ambulate. (Discussed this with RN today).  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 -CT chest on 10/03/13 was negative for PE. -CXR without ATX/effusion/infiltrate. -ABG shows a respiratory acidosis that is partially compensated. -Suspect related to obstructive sleep apnea/obesity hypoventilation syndrome and will benefit from outpatient sleep study after discharge. -Continue oxygen supplementation for now. Will likely need to DC with oxygen.  Hypothyroidism -Continue Synthroid.   Diabetes mellitus -CBGs remained elevated. -Start levemir 10.  Sinus tachycardia -Suspect related to pain. -No need for acute intervention.   Code Status: Full code Family Communication: Mother at bedside updated on plan of care  Disposition Plan: Likely SNF once medically stable.   Consultants:  Orthopedics   Antibiotics:  None   Subjective: Mild to moderate leg pain. No other complaints. Does not feel short of breath, no chest pain.  Objective: Filed Vitals:   10/04/13 2220 10/05/13 0146 10/05/13 0456 10/05/13 0800  BP: 115/51 109/52 107/54   Pulse: 128 127 123   Temp: 98.7 F (37.1 C) 97.9 F (36.6 C) 99.7 F (37.6 C)   TempSrc: Axillary Oral    Resp: 18 18 20 20   Height:      Weight:      SpO2: 94% 94% 97% 90%    Intake/Output Summary (Last 24 hours) at 10/05/13 0856 Last data filed at 10/05/13 0700  Gross per 24 hour  Intake    360 ml  Output   3875 ml  Net  -3515 ml   Filed  Weights   10/03/13 0545 10/03/13 1100  Weight: 152.7 kg (336 lb 10.3 oz) 152.7 kg (336 lb 10.3 oz)    Exam:   General:  Alert, awake, oriented x3, obese  Cardiovascular: Tachycardic, regular rhythm, no murmurs, rubs or gallops  Respiratory: Clear to auscultation bilaterally  Abdomen: Obese, soft, nontender, nondistended, positive bowel sounds  Extremities: Trace bilateral pitting edema   Neurologic:  Nonfocal  Data Reviewed: Basic Metabolic Panel:  Recent Labs Lab 10/03/13 0014 10/04/13 0355 10/05/13 0431  NA 137 137 135  K 4.7 4.4 4.1  CL 96 97 92*  CO2 30 29 33*  GLUCOSE 353* 285* 264*  BUN 13 6 4*  CREATININE 0.68 0.55 0.64  CALCIUM 9.6 9.1 9.2   Liver Function Tests:  Recent Labs Lab 10/03/13 0014  AST 33  ALT 38*  ALKPHOS 86  BILITOT 0.4  PROT 7.8  ALBUMIN 4.1   No results found for this basename: LIPASE, AMYLASE,  in the last 168 hours No results found for this basename: AMMONIA,  in the last 168 hours CBC:  Recent Labs Lab 10/03/13 0014 10/04/13 0355 10/05/13 0431  WBC 16.5* 14.4* 12.8*  NEUTROABS 14.0*  --   --   HGB 14.3 12.5 11.6*  HCT 41.9 38.0 36.1  MCV 89.7 90.5 91.6  PLT 246 247 217   Cardiac Enzymes:  Recent Labs Lab 10/03/13 0014  TROPONINI <0.30   BNP (last 3 results)  Recent Labs  10/03/13 0014  PROBNP 17.7   CBG:  Recent Labs Lab 10/04/13 0929 10/04/13 1429 10/04/13 1703 10/04/13 2204  10/05/13 0815  GLUCAP 288* 269* 265* 253* 261*    Recent Results (from the past 240 hour(s))  MRSA PCR SCREENING     Status: None   Collection Time    10/03/13  2:57 PM      Result Value Range Status   MRSA by PCR NEGATIVE  NEGATIVE Final   Comment:            The GeneXpert MRSA Assay (FDA     approved for NASAL specimens     only), is one component of a     comprehensive MRSA colonization     surveillance program. It is not     intended to diagnose MRSA     infection nor to guide or     monitor treatment for      MRSA infections.     Studies: Dg Femur Left  10/03/2013   CLINICAL DATA:  Left femur fixation.  EXAM: DG C-ARM 61-120 MIN; LEFT FEMUR - 2 VIEW  COMPARISON:  Plain film of earlier today.  FINDINGS: A total of 5 intraoperative images. These demonstrate placement of an intra medullary rod with 1 proximal and 4 distal locking screws across the previously described femoral shaft fracture. Improved alignment with minimal residual displacement remaining. No acute hardware complication.  IMPRESSION: Intraoperative imaging of femoral fixation.   Electronically Signed   By: Jeronimo Greaves M.D.   On: 10/03/2013 19:57   Dg Chest Port 1 View  10/04/2013   CLINICAL DATA:  Hypoxemia  EXAM: PORTABLE CHEST - 1 VIEW  COMPARISON:  Portable chest x-ray of 10/03/2013 and CT angio chest of the same day  FINDINGS: The lungs are not well aerated with mild basilar volume loss. No focal infiltrate or effusion is seen by chest x-ray. The heart is within upper limits of normal.  IMPRESSION: Poor aeration with mild volume loss at the bases.   Electronically Signed   By: Dwyane Dee M.D.   On: 10/04/2013 16:06   Dg C-arm 61-120 Min  10/03/2013   CLINICAL DATA:  Left femur fixation.  EXAM: DG C-ARM 61-120 MIN; LEFT FEMUR - 2 VIEW  COMPARISON:  Plain film of earlier today.  FINDINGS: A total of 5 intraoperative images. These demonstrate placement of an intra medullary rod with 1 proximal and 4 distal locking screws across the previously described femoral shaft fracture. Improved alignment with minimal residual displacement remaining. No acute hardware complication.  IMPRESSION: Intraoperative imaging of femoral fixation.   Electronically Signed   By: Jeronimo Greaves M.D.   On: 10/03/2013 19:57    Scheduled Meds: . aspirin EC  325 mg Oral Q breakfast  . docusate sodium  100 mg Oral BID  . insulin aspart  0-15 Units Subcutaneous TID WC  . insulin aspart  4 Units Subcutaneous TID WC   Continuous Infusions: . sodium chloride 100 mL/hr  at 10/05/13 9629    Principal Problem:   Closed displaced oblique fracture of shaft of left femur Active Problems:   Hypothyroidism   Diabetes   Acute urinary retention   Hypoxemia    Time spent: 35 minutes. Greater than 50% of this time was spent in direct contact with the patient coordinating care.    Alexandra Kelley  [facility] Pager [phone]  If 7PM-7AM, please contact night-coverage at [email or web address], password [facility] 10/05/2013, 8:56 AM  LOS: 3 days

## 2013-10-05 NOTE — Progress Notes (Signed)
Subjective: 2 Days Post-Op Procedure(s) (LRB): INTRAMEDULLARY (IM) RETROGRADE FEMORAL NAILING (Left) Patient reports pain as mild and moderate.    Objective: Vital signs in last 24 hours: Temp:  [97.9 F (36.6 C)-99.7 F (37.6 C)] 99.7 F (37.6 C) (12/04 0456) Pulse Rate:  [123-130] 123 (12/04 0456) Resp:  [18-20] 20 (12/04 0456) BP: (107-115)/(50-54) 107/54 mmHg (12/04 0456) SpO2:  [75 %-97 %] 97 % (12/04 0456)  Intake/Output from previous day: 12/03 0701 - 12/04 0700 In: 360 [P.O.:360] Out: 3875 [Urine:3875] Intake/Output this shift:     Recent Labs  10/03/13 0014 10/04/13 0355 10/05/13 0431  HGB 14.3 12.5 11.6*    Recent Labs  10/04/13 0355 10/05/13 0431  WBC 14.4* 12.8*  RBC 4.20 3.94  HCT 38.0 36.1  PLT 247 217    Recent Labs  10/04/13 0355 10/05/13 0431  NA 137 135  K 4.4 4.1  CL 97 92*  CO2 29 33*  BUN 6 4*  CREATININE 0.55 0.64  GLUCOSE 285* 264*  CALCIUM 9.1 9.2   No results found for this basename: LABPT, INR,  in the last 72 hours  Neurologically intact  Assessment/Plan: 2 Days Post-Op Procedure(s) (LRB): INTRAMEDULLARY (IM) RETROGRADE FEMORAL NAILING (Left) Up with therapy She is in recliner.  SL: IV Angela Vazguez C 10/05/2013, 8:22 AM

## 2013-10-05 NOTE — Progress Notes (Signed)
PT Cancellation Note  Patient Details Name: Alexandra Kelley MRN: [id number] DOB: 1981-06-20   Cancelled Treatment:    Reason Eval/Treat Not Completed: Fatigue/lethargy limiting ability to participate. Patient stated that she was too tired to participate in therapy this afternoon. Patient was up earlier in the recliner and was encouraged to sit up again for dinner. Will attempt to work with patient again tomorrow   Fredrich Birks 10/05/2013, 1:59 PM 10/05/2013 Fredrich Birks PTA [phone] pager [phone] office

## 2013-10-06 ENCOUNTER — Encounter (HOSPITAL_COMMUNITY): Payer: Self-pay | Admitting: Orthopaedic Surgery

## 2013-10-06 DIAGNOSIS — J9620 Acute and chronic respiratory failure, unspecified whether with hypoxia or hypercapnia: Secondary | ICD-10-CM

## 2013-10-06 DIAGNOSIS — S72309A Unspecified fracture of shaft of unspecified femur, initial encounter for closed fracture: Principal | ICD-10-CM

## 2013-10-06 LAB — BASIC METABOLIC PANEL
BUN: 14 mg/dL (ref 6–23)
CO2: 31 mEq/L (ref 19–32)
Calcium: 9.9 mg/dL (ref 8.4–10.5)
Chloride: 94 mEq/L — ABNORMAL LOW (ref 96–112)
Creatinine, Ser: 1.22 mg/dL — ABNORMAL HIGH (ref 0.50–1.10)
GFR calc non Af Amer: 59 mL/min — ABNORMAL LOW (ref >90)
Glucose, Bld: 230 mg/dL — ABNORMAL HIGH (ref 70–99)
Sodium: 135 mEq/L (ref 135–145)

## 2013-10-06 LAB — GLUCOSE, CAPILLARY
Glucose-Capillary: 224 mg/dL — ABNORMAL HIGH (ref 70–99)
Glucose-Capillary: 251 mg/dL — ABNORMAL HIGH (ref 70–99)
Glucose-Capillary: 268 mg/dL — ABNORMAL HIGH (ref 70–99)

## 2013-10-06 LAB — CBC
MCH: 30 pg (ref 26.0–34.0)
MCHC: 34 g/dL (ref 30.0–36.0)
MCV: 88.4 fL (ref 78.0–100.0)
Platelets: 244 10*3/uL (ref 150–400)
RBC: 4.06 MIL/uL (ref 3.87–5.11)
RDW: 14.2 % (ref 11.5–15.5)

## 2013-10-06 LAB — HEMOGLOBIN A1C: Mean Plasma Glucose: 171 mg/dL — ABNORMAL HIGH (ref <117)

## 2013-10-06 MED ORDER — HYDROCODONE-ACETAMINOPHEN 5-325 MG PO TABS: 1.0000 | ORAL_TABLET | Freq: Four times a day (QID) | ORAL | Status: DC | PRN

## 2013-10-06 MED ORDER — DIPHENHYDRAMINE-ZINC ACETATE 2-0.1 % EX CREA
TOPICAL_CREAM | CUTANEOUS | Status: DC | PRN
  Administered 2013-10-06: 14:00:00 via TOPICAL
  Filled 2013-10-06: qty 28

## 2013-10-06 NOTE — Progress Notes (Signed)
Subjective: 3 Days Post-Op Procedure(s) (LRB): INTRAMEDULLARY (IM) RETROGRADE FEMORAL NAILING (Left) Patient reports pain as mild.    Objective: Vital signs in last 24 hours: Temp:  [97.2 F (36.2 C)-98.3 F (36.8 C)] 98.2 F (36.8 C) (12/05 0500) Pulse Rate:  [119-132] 119 (12/05 0500) Resp:  [17-20] 18 (12/05 0800) BP: (97-116)/(40-58) 101/49 mmHg (12/05 0500) SpO2:  [93 %-96 %] 93 % (12/05 0500)  Intake/Output from previous day: 12/04 0701 - 12/05 0700 In: 561 [P.O.:400; I.V.:161] Out: 1400 [Urine:1400] Intake/Output this shift: Total I/O In: -  Out: 700 [Urine:700]   Recent Labs  10/04/13 0355 10/05/13 0431 10/06/13 0540  HGB 12.5 11.6* 12.2    Recent Labs  10/05/13 0431 10/06/13 0540  WBC 12.8* 14.7*  RBC 3.94 4.06  HCT 36.1 35.9*  PLT 217 244    Recent Labs  10/05/13 1900 10/06/13 0540  NA 128* 135  K 6.2* 4.3  CL 86* 94*  CO2 29 31  BUN 11 14  CREATININE 1.58* 1.22*  GLUCOSE 428* 230*  CALCIUM 9.7 9.9   No results found for this basename: LABPT, INR,  in the last 72 hours  Neurologically intact  Assessment/Plan: 3 Days Post-Op Procedure(s) (LRB): INTRAMEDULLARY (IM) RETROGRADE FEMORAL NAILING (Left) Up with therapy   ,  SNF short term.   Alexandra Kelley C 10/06/2013, 12:23 PM

## 2013-10-06 NOTE — Progress Notes (Signed)
[facility]                                                                                Patient Demographics  Alexandra Kelley, is a 32 y.o. female, DOB - 1981/03/20, WUJ:[id number]  Admit date - 10/02/2013   Admitting Physician Lynden Oxford, MD  Outpatient Primary MD for the patient is Colette Ribas, MD  LOS - 4   Chief Complaint  Patient presents with  . Fall  . Leg Pain        Assessment & Plan  Principal Problem:   Closed displaced oblique fracture of shaft of left femur Active Problems:   Hypothyroidism   Diabetes   Acute urinary retention   Acute-on-chronic respiratory failure  Closed displaced oblique fracture of shaft of left femur  -Status post surgery 10/03/13.  -Plan as per orthopedics.  -SNF as recommended by PT.   Acute urinary retention  -Will discontinue foley catheter today.   -Continue tamsulosin.   Acute on Chronic Hypoxemic/Hypercarbic Respiratory Failure  -Sats drop into the 80s on room air even at rest.   -CT chest on 10/03/13 was negative for PE.  -CXR without ATX/effusion/infiltrate.  -ABG shows a respiratory acidosis that is partially compensated.  -Suspect related to obstructive sleep apnea/obesity hypoventilation syndrome and will benefit from outpatient sleep study after discharge.  -Continue CPAP at night.  Hypothyroidism  -Continue Synthroid.   Hyperglycemia in the setting of Diabetes mellitus  -CBGs remained elevated likely secondary to increased stress -HbA1c 7.6 -Diabetes coordinator following -Continue novolog 4 units with meals, Lantus 20units QHS, and ISS  Sinus tachycardia  -Suspect related to pain.  -No need for acute intervention.   Acute Kidney Injury -Currently trending downward.  1.22 today.   -Baseline creatinine of 0.6. -Continue gentle hydration with IVF.  Code Status: Full code   Family Communication: Parents at bedside.  Disposition Plan: Likely SNF once medically stable and bed becomes  available.  Currently one bed has become available, however, father does not believe that the facility will be able to offer the patient the necessary care needed.  We will continue to look for SNF placement.  At this time, hyperglycemia needs to be better controlled, as does her AKI will need to resolve.    Had extensive conversation with the father.  The patient will remain in the hospital until she is medically stable for discharge and she has a bed offer at a SNF.  If a bed does not become available, patient will be discharged home with home health once stable.  Father understands and agrees.  Procedures  Left Intramedullary retrograde femoral nailing  Consults   Orthopedics  DVT Prophylaxis  SCDs   Lab Results  Component Value Date   PLT 244 10/06/2013    Medications  Scheduled Meds: . aspirin EC  325 mg Oral Q breakfast  . docusate sodium  100 mg Oral BID  . insulin aspart  0-20 Units Subcutaneous TID WC  . insulin aspart  4 Units Subcutaneous TID WC  . insulin detemir  20 Units Subcutaneous QHS  . levothyroxine  137 mcg Oral QAC breakfast  . pantoprazole  40 mg Oral Q1200  Continuous Infusions: . sodium chloride 75 mL/hr at 10/05/13 2041   PRN Meds:.acetaminophen, acetaminophen, bisacodyl, diphenhydrAMINE-zinc acetate, HYDROcodone-acetaminophen, menthol-cetylpyridinium, methocarbamol (ROBAXIN) IV, methocarbamol, metoCLOPramide (REGLAN) injection, metoCLOPramide, morphine injection, ondansetron (ZOFRAN) IV, ondansetron, phenol, polyethylene glycol  Antibiotics   Anti-infectives   Start     Dose/Rate Route Frequency Ordered Stop   10/03/13 1730  ceFAZolin (ANCEF) 3 g in dextrose 5 % 50 mL IVPB  Status:  Discontinued     3 g 160 mL/hr over 30 Minutes Intravenous  Once 10/03/13 1727 10/03/13 2155   10/03/13 0845  ciprofloxacin (CIPRO) tablet 500 mg     500 mg Oral  Once 10/03/13 0732 10/03/13 1024       Time Spent in minutes  75 minutes   Alexandra Kelley D.O. on  10/06/2013 at 4:28 PM  Between 7am to 7pm - Pager - [phone]  After 7pm go to [email or web address] - password [email or web address]  And look for the night coverage person covering for me after hours  [facility] Office  [phone]    Subjective:   Alexandra Kelley seen and examined today.  Patient still feels pain in her left leg.   Patient denies dizziness, chest pain, shortness of breath, abdominal pain, N/V/D/C, new weakness, numbess, tingling.    Objective:   Filed Vitals:   10/06/13 0500 10/06/13 0800 10/06/13 1200 10/06/13 1336  BP: 101/49   114/45  Pulse: 119   126  Temp: 98.2 F (36.8 C)   98.1 F (36.7 C)  TempSrc: Oral   Oral  Resp: 17 18 18 18   Height:      Weight:      SpO2: 93%  96% 99%    Wt Readings from Last 3 Encounters:  10/03/13 152.7 kg (336 lb 10.3 oz)  10/03/13 152.7 kg (336 lb 10.3 oz)  01/27/13 148.78 kg (328 lb)     Intake/Output Summary (Last 24 hours) at 10/06/13 1628 Last data filed at 10/06/13 2956  Gross per 24 hour  Intake      0 ml  Output   2100 ml  Net  -2100 ml    Exam  General: Well developed, well nourished, NAD, appears stated age  HEENT: NCAT, PERRLA, EOMI, Anicteic Sclera, mucous membranes moist. No pharyngeal erythema or exudates  Neck: Supple, no JVD, no masses  Cardiovascular: S1 S2 auscultated, tachycardic, no rubs, murmurs or gallops  Respiratory: Clear to auscultation bilaterally with equal chest rise  Abdomen: Soft, obese, nontender, nondistended, + bowel sounds  Extremities: warm dry without cyanosis clubbing. Trace pitting edema in the lower extremities bilaterally  Neuro: AAOx3, cranial nerves grossly intact.   Skin: Without rashes exudates or nodules  Psych: Normal affect and demeanor with intact judgement and insight  Data Review   Micro Results Recent Results (from the past 240 hour(s))  MRSA PCR SCREENING     Status: None   Collection Time    10/03/13  2:57 PM      Result Value Range Status    MRSA by PCR NEGATIVE  NEGATIVE Final   Comment:            The GeneXpert MRSA Assay (FDA     approved for NASAL specimens     only), is one component of a     comprehensive MRSA colonization     surveillance program. It is not     intended to diagnose MRSA     infection nor to guide or     monitor treatment for  MRSA infections.    Radiology Reports Dg Chest 1 View  10/03/2013   CLINICAL DATA:  Fall, leg pain  EXAM: CHEST - 1 VIEW  COMPARISON:  None.  FINDINGS: Hypoaeration and portable technique. Cardiomediastinal contours are likely within normal range allowing for this. No common airspace opacity, pleural effusion, or pneumothorax. No acute osseous finding.  IMPRESSION: Hypoaeration.  No acute process identified.   Electronically Signed   By: Jearld Lesch M.D.   On: 10/03/2013 01:27   Dg Femur Left  10/03/2013   CLINICAL DATA:  Left femur fixation.  EXAM: DG C-ARM 61-120 MIN; LEFT FEMUR - 2 VIEW  COMPARISON:  Plain film of earlier today.  FINDINGS: A total of 5 intraoperative images. These demonstrate placement of an intra medullary rod with 1 proximal and 4 distal locking screws across the previously described femoral shaft fracture. Improved alignment with minimal residual displacement remaining. No acute hardware complication.  IMPRESSION: Intraoperative imaging of femoral fixation.   Electronically Signed   By: Jeronimo Greaves M.D.   On: 10/03/2013 19:57   Dg Femur Left  10/03/2013   CLINICAL DATA:  Fall, leg pain  EXAM: LEFT FEMUR - 2 VIEW  COMPARISON:  None.  FINDINGS: Comminuted predominantly oblique fracture through the mid and distal left femoral shaft. There is medial displacement and angulation of the distal component. Femoral head grossly remains seated within the acetabulum. The inferior margin of the fracture extends to the patellofemoral articulation anteriorly. No definite extension into the tibial femoral articulation. Small joint effusion.  IMPRESSION: Displaced complex  fracture of the left femur as above.   Electronically Signed   By: Jearld Lesch M.D.   On: 10/03/2013 01:26   Ct Angio Chest Pe W/cm &/or Wo Cm  10/03/2013   CLINICAL DATA:  Leg swelling  EXAM: CT ANGIOGRAPHY CHEST WITH CONTRAST  TECHNIQUE: Multidetector CT imaging of the chest was performed using the standard protocol during bolus administration of intravenous contrast. Multiplanar CT image reconstructions including MIPs were obtained to evaluate the vascular anatomy.  CONTRAST:  80mL OMNIPAQUE IOHEXOL 350 MG/ML SOLN  COMPARISON:  None.  FINDINGS: Contrast bolus timing is not optimized to evaluate for pulmonary embolism. No large/central filling defect. The lobar and more peripheral branches are nondiagnostic.  Heart size upper normal to mildly enlarged. Normal caliber aorta. No overt pleural or pericardial effusion. No lymphadenopathy. Limited upper abdominal images show hepatic steatosis.  Degraded by respiratory motion. Ground-glass opacity and mosaic attenuation, favored to reflect atelectasis/areas of air trapping. No confluent airspace opacity. No pneumothorax.  No acute osseous finding.  Review of the MIP images confirms the above findings.  IMPRESSION: Suboptimal contrast bolus timing. No central pulmonary embolism. Lobar or more peripheral branches are nondiagnostic.  Degraded by respiratory motion/expiratory phase. Mosaic attenuation suggests areas of air trapping and atelectasis.  Hepatic steatosis.   Electronically Signed   By: Jearld Lesch M.D.   On: 10/03/2013 03:11   Dg Chest Port 1 View  10/04/2013   CLINICAL DATA:  Hypoxemia  EXAM: PORTABLE CHEST - 1 VIEW  COMPARISON:  Portable chest x-ray of 10/03/2013 and CT angio chest of the same day  FINDINGS: The lungs are not well aerated with mild basilar volume loss. No focal infiltrate or effusion is seen by chest x-ray. The heart is within upper limits of normal.  IMPRESSION: Poor aeration with mild volume loss at the bases.    Electronically Signed   By: Dwyane Dee M.D.   On: 10/04/2013 16:06  Dg C-arm 61-120 Min  10/03/2013   CLINICAL DATA:  Left femur fixation.  EXAM: DG C-ARM 61-120 MIN; LEFT FEMUR - 2 VIEW  COMPARISON:  Plain film of earlier today.  FINDINGS: A total of 5 intraoperative images. These demonstrate placement of an intra medullary rod with 1 proximal and 4 distal locking screws across the previously described femoral shaft fracture. Improved alignment with minimal residual displacement remaining. No acute hardware complication.  IMPRESSION: Intraoperative imaging of femoral fixation.   Electronically Signed   By: Jeronimo Greaves M.D.   On: 10/03/2013 19:57    CBC  Recent Labs Lab 10/03/13 0014 10/04/13 0355 10/05/13 0431 10/06/13 0540  WBC 16.5* 14.4* 12.8* 14.7*  HGB 14.3 12.5 11.6* 12.2  HCT 41.9 38.0 36.1 35.9*  PLT 246 247 217 244  MCV 89.7 90.5 91.6 88.4  MCH 30.6 29.8 29.4 30.0  MCHC 34.1 32.9 32.1 34.0  RDW 14.7 15.0 14.6 14.2  LYMPHSABS 1.7  --   --   --   MONOABS 0.7  --   --   --   EOSABS 0.0  --   --   --   BASOSABS 0.0  --   --   --     Chemistries   Recent Labs Lab 10/03/13 0014 10/04/13 0355 10/05/13 0431 10/05/13 1258 10/05/13 1900 10/06/13 0540  NA 137 137 135  --  128* 135  K 4.7 4.4 4.1  --  6.2* 4.3  CL 96 97 92*  --  86* 94*  CO2 30 29 33*  --  29 31  GLUCOSE 353* 285* 264* 526* 428* 230*  BUN 13 6 4*  --  11 14  CREATININE 0.68 0.55 0.64  --  1.58* 1.22*  CALCIUM 9.6 9.1 9.2  --  9.7 9.9  AST 33  --   --   --   --   --   ALT 38*  --   --   --   --   --   ALKPHOS 86  --   --   --   --   --   BILITOT 0.4  --   --   --   --   --    ------------------------------------------------------------------------------------------------------------------ estimated creatinine clearance is 98.5 ml/min (by C-G formula based on Cr of 1.22). ------------------------------------------------------------------------------------------------------------------  Recent  Labs  10/05/13 1900  HGBA1C 7.6*   ------------------------------------------------------------------------------------------------------------------ No results found for this basename: CHOL, HDL, LDLCALC, TRIG, CHOLHDL, LDLDIRECT,  in the last 72 hours ------------------------------------------------------------------------------------------------------------------ No results found for this basename: TSH, T4TOTAL, FREET3, T3FREE, THYROIDAB,  in the last 72 hours ------------------------------------------------------------------------------------------------------------------ No results found for this basename: VITAMINB12, FOLATE, FERRITIN, TIBC, IRON, RETICCTPCT,  in the last 72 hours  Coagulation profile No results found for this basename: INR, PROTIME,  in the last 168 hours  No results found for this basename: DDIMER,  in the last 72 hours  Cardiac Enzymes  Recent Labs Lab 10/03/13 0014  TROPONINI <0.30   ------------------------------------------------------------------------------------------------------------------ No components found with this basename: POCBNP,

## 2013-10-06 NOTE — Discharge Summary (Signed)
Physician Discharge Summary  Alexandra Kelley JYN:[id number] DOB: 1981/09/18 DOA: 10/02/2013  PCP: Colette Ribas, MD  Admit date: 10/02/2013 Discharge date: 10/06/2013  Time spent: 35 minutes  Recommendations for Outpatient Follow-up:  Patient will be discharged to inpatient rehabilitation. She should continue physical therapy as well as occupational therapy as instructed by the rehabilitation unit. Patient also follow for primary care physician once discharged from inpatient rehabilitation. She should also followup with Dr. Ophelia Charter at the notified time. Patient should continue taking her medications as prescribed.  Discharge Diagnoses:  Principal Problem:   Closed displaced oblique fracture of shaft of left femur Active Problems:   Hypothyroidism   Diabetes   Acute urinary retention   Acute-on-chronic respiratory failure   Discharge Condition: Stable  Diet recommendation: Carb modified  Filed Weights   10/03/13 0545 10/03/13 1100  Weight: 152.7 kg (336 lb 10.3 oz) 152.7 kg (336 lb 10.3 oz)    History of present illness:  Alexandra Kelley is a 32 y.o. female with Past medical history of diabetes and hypothyroidism.The patient is presenting with a mechanical fall. As per the EMS when the patient was standing up from the dinner table she had a fall and severe onset of pain on the left high followed by a near fall.  The patient does not remember the event but As per my discussion with the family, apparently when she tried to stand up from the dinner table she tripped over something and then had a fall and then they were not able to lift her up so they called first responders and then EMS. The patient herself at present denies any complaint of headache chest pain nausea or vomiting during the day diarrhea or constipation during the day burning urination cough abdominal pain or any focal neurological deficit. Her only complaint at present is pain in her left thigh.   Hospital Course:   This is a 32 year old female history of diabetes and hypothyroidism secondary to mechanical fall at home before coming to the emergency department. She fell from a standing position at the dinner table at which point she had left thigh pain. Patient was admitted for a closed displaced oblique fracture of the left femur shaft. Orthopedics was consulted, and performed intramedullary retrograde femoral nailing. Physical therapy and occupational therapy were consulted and did recommend nursing home placement for rehabilitation. Pain control was continued as well. Patient also developed acute urinary retention during her hospital course. A Foley catheter was placed. 10 ms was also started. Patient did have frank rales once ambulatory. The catheter was discontinued. Patient also developed acute on chronic hypoxemic hypercarbic respiratory failure. She was noted to have her oxygen saturations drop into the 80s at rest. CT of the chest was conducted on 12 2 which was negative for PE. Her chest x-ray did show an effusion and infiltrate. Her ABG shows respiratory acidosis which is partially compensated. Obstructive sleep apnea was suspected versus obesity hypoventilation syndrome. Patient will benefit from outpatient sleep study after discharge. Patient was placed on CPAP and did tolerate this well. She will be discharged with this as well. As for her chronic condition of hypothyroidism she was continued on her Synthroid. Patient also has diabetes mellitus for which she was started on Levemir and continued on a sliding insulin scale. She had sinus tachycardia which was suspected secondary to pain.   Patient is also known to have acute kidney injury however this has been resolving. Her creatinine is currently 1.22. Is also noted to  have hyponatremia which did resolve her current sodium is 135. These problems were likely secondary to dehydration and hypovolemia.  Patient was also seen by physical therapy at which point SNF  placement was recommended. However during her hospital course patient did improve and inpatient rehabilitation was recommended. Patient was seen and evaluated patient rehabilitation was accepted. Patient will be discharged today. Patient to continue taking medication as prescribed. She will need followup with her primary care physician as well as Dr. Ophelia Charter, orthopedics once discharged to inpatient rehabilitation. This was discussed with the family as well as the patient and they do understand agree.  Procedures: Left intramedullary retrograde femoral nailing  Consultations: Orthopedics  Discharge Exam: Filed Vitals:   10/06/13 0800  BP:   Pulse:   Temp:   Resp: 18     General: Well developed, well nourished, NAD, appears stated age  HEENT: NCAT, PERRLA, EOMI, Anicteic Sclera, mucous membranes moist. No pharyngeal erythema or exudates  Neck: Supple, no JVD, no masses  Cardiovascular: S1 S2 auscultated, no rubs, murmurs or gallops, tachycardic  Respiratory: Clear to auscultation bilaterally with equal chest rise  Abdomen: Soft, obese, nontender, nondistended, + bowel sounds  Extremities: warm dry without cyanosis clubbing.  Trace bilateral pitting edema  Neuro: AAOx3, cranial nerves grossly intact.   Skin: Without rashes exudates or nodules  Psych: Normal affect and demeanor with intact judgement and insight  Discharge Instructions     Medication List    ASK your doctor about these medications       fluticasone 50 MCG/ACT nasal spray  Commonly known as:  FLONASE  Place 2 sprays into the nose daily.     glimepiride 4 MG tablet  Commonly known as:  AMARYL  Take 4 mg by mouth 2 (two) times daily.     levothyroxine 137 MCG tablet  Commonly known as:  SYNTHROID, LEVOTHROID  Take 137 mcg by mouth daily.     ranitidine 150 MG tablet  Commonly known as:  ZANTAC  Take 150 mg by mouth 2 (two) times daily as needed for heartburn.       Allergies  Allergen Reactions   . Ceclor [Cefaclor]     Rash  . Penicillins Rash      The results of significant diagnostics from this hospitalization (including imaging, microbiology, ancillary and laboratory) are listed below for reference.    Significant Diagnostic Studies: Dg Chest 1 View  10/03/2013   CLINICAL DATA:  Fall, leg pain  EXAM: CHEST - 1 VIEW  COMPARISON:  None.  FINDINGS: Hypoaeration and portable technique. Cardiomediastinal contours are likely within normal range allowing for this. No common airspace opacity, pleural effusion, or pneumothorax. No acute osseous finding.  IMPRESSION: Hypoaeration.  No acute process identified.   Electronically Signed   By: Jearld Lesch M.D.   On: 10/03/2013 01:27   Dg Femur Left  10/03/2013   CLINICAL DATA:  Left femur fixation.  EXAM: DG C-ARM 61-120 MIN; LEFT FEMUR - 2 VIEW  COMPARISON:  Plain film of earlier today.  FINDINGS: A total of 5 intraoperative images. These demonstrate placement of an intra medullary rod with 1 proximal and 4 distal locking screws across the previously described femoral shaft fracture. Improved alignment with minimal residual displacement remaining. No acute hardware complication.  IMPRESSION: Intraoperative imaging of femoral fixation.   Electronically Signed   By: Jeronimo Greaves M.D.   On: 10/03/2013 19:57   Dg Femur Left  10/03/2013   CLINICAL DATA:  Fall, leg pain  EXAM: LEFT FEMUR - 2 VIEW  COMPARISON:  None.  FINDINGS: Comminuted predominantly oblique fracture through the mid and distal left femoral shaft. There is medial displacement and angulation of the distal component. Femoral head grossly remains seated within the acetabulum. The inferior margin of the fracture extends to the patellofemoral articulation anteriorly. No definite extension into the tibial femoral articulation. Small joint effusion.  IMPRESSION: Displaced complex fracture of the left femur as above.   Electronically Signed   By: Jearld Lesch M.D.   On: 10/03/2013 01:26    Ct Angio Chest Pe W/cm &/or Wo Cm  10/03/2013   CLINICAL DATA:  Leg swelling  EXAM: CT ANGIOGRAPHY CHEST WITH CONTRAST  TECHNIQUE: Multidetector CT imaging of the chest was performed using the standard protocol during bolus administration of intravenous contrast. Multiplanar CT image reconstructions including MIPs were obtained to evaluate the vascular anatomy.  CONTRAST:  80mL OMNIPAQUE IOHEXOL 350 MG/ML SOLN  COMPARISON:  None.  FINDINGS: Contrast bolus timing is not optimized to evaluate for pulmonary embolism. No large/central filling defect. The lobar and more peripheral branches are nondiagnostic.  Heart size upper normal to mildly enlarged. Normal caliber aorta. No overt pleural or pericardial effusion. No lymphadenopathy. Limited upper abdominal images show hepatic steatosis.  Degraded by respiratory motion. Ground-glass opacity and mosaic attenuation, favored to reflect atelectasis/areas of air trapping. No confluent airspace opacity. No pneumothorax.  No acute osseous finding.  Review of the MIP images confirms the above findings.  IMPRESSION: Suboptimal contrast bolus timing. No central pulmonary embolism. Lobar or more peripheral branches are nondiagnostic.  Degraded by respiratory motion/expiratory phase. Mosaic attenuation suggests areas of air trapping and atelectasis.  Hepatic steatosis.   Electronically Signed   By: Jearld Lesch M.D.   On: 10/03/2013 03:11   Dg Chest Port 1 View  10/04/2013   CLINICAL DATA:  Hypoxemia  EXAM: PORTABLE CHEST - 1 VIEW  COMPARISON:  Portable chest x-ray of 10/03/2013 and CT angio chest of the same day  FINDINGS: The lungs are not well aerated with mild basilar volume loss. No focal infiltrate or effusion is seen by chest x-ray. The heart is within upper limits of normal.  IMPRESSION: Poor aeration with mild volume loss at the bases.   Electronically Signed   By: Dwyane Dee M.D.   On: 10/04/2013 16:06   Dg C-arm 61-120 Min  10/03/2013   CLINICAL DATA:   Left femur fixation.  EXAM: DG C-ARM 61-120 MIN; LEFT FEMUR - 2 VIEW  COMPARISON:  Plain film of earlier today.  FINDINGS: A total of 5 intraoperative images. These demonstrate placement of an intra medullary rod with 1 proximal and 4 distal locking screws across the previously described femoral shaft fracture. Improved alignment with minimal residual displacement remaining. No acute hardware complication.  IMPRESSION: Intraoperative imaging of femoral fixation.   Electronically Signed   By: Jeronimo Greaves M.D.   On: 10/03/2013 19:57    Microbiology: Recent Results (from the past 240 hour(s))  MRSA PCR SCREENING     Status: None   Collection Time    10/03/13  2:57 PM      Result Value Range Status   MRSA by PCR NEGATIVE  NEGATIVE Final   Comment:            The GeneXpert MRSA Assay (FDA     approved for NASAL specimens     only), is one component of a     comprehensive MRSA colonization  surveillance program. It is not     intended to diagnose MRSA     infection nor to guide or     monitor treatment for     MRSA infections.     Labs: Basic Metabolic Panel:  Recent Labs Lab 10/03/13 0014 10/04/13 0355 10/05/13 0431 10/05/13 1258 10/05/13 1900 10/06/13 0540  NA 137 137 135  --  128* 135  K 4.7 4.4 4.1  --  6.2* 4.3  CL 96 97 92*  --  86* 94*  CO2 30 29 33*  --  29 31  GLUCOSE 353* 285* 264* 526* 428* 230*  BUN 13 6 4*  --  11 14  CREATININE 0.68 0.55 0.64  --  1.58* 1.22*  CALCIUM 9.6 9.1 9.2  --  9.7 9.9   Liver Function Tests:  Recent Labs Lab 10/03/13 0014  AST 33  ALT 38*  ALKPHOS 86  BILITOT 0.4  PROT 7.8  ALBUMIN 4.1   No results found for this basename: LIPASE, AMYLASE,  in the last 168 hours No results found for this basename: AMMONIA,  in the last 168 hours CBC:  Recent Labs Lab 10/03/13 0014 10/04/13 0355 10/05/13 0431 10/06/13 0540  WBC 16.5* 14.4* 12.8* 14.7*  NEUTROABS 14.0*  --   --   --   HGB 14.3 12.5 11.6* 12.2  HCT 41.9 38.0 36.1  35.9*  MCV 89.7 90.5 91.6 88.4  PLT 246 247 217 244   Cardiac Enzymes:  Recent Labs Lab 10/03/13 0014  TROPONINI <0.30   BNP: BNP (last 3 results)  Recent Labs  10/03/13 0014  PROBNP 17.7   CBG:  Recent Labs Lab 10/05/13 1754 10/05/13 2059 10/05/13 2327 10/06/13 0802 10/06/13 1206  GLUCAP 405* 332* 311* 225* 224*       Signed:  Moriya Mitchell  [facility] 10/06/2013, 12:35 PM

## 2013-10-06 NOTE — Clinical Social Work Note (Signed)
CSW met with Alexandra Kelley and Alexandra Kelley's father at bedside to present bed offer for SNF placement at [facility]. [facility] has agreed to provide Alexandra Kelley/OT services for Alexandra Kelley. Alexandra Kelley and Alexandra Kelley's father stated that they would not like for Alexandra Kelley to be placed at [facility] Alexandra Kelley's father was very clear with CSW that Alexandra Kelley would NOT be going to [facility]. Alexandra Kelley's father informed CSW that he would prefer for Alexandra Kelley to be placed at [facility]. CSW informed Alexandra Kelley's father that [facility] SNF was unable to offer bed placement due to no female beds available. CSW informed Alexandra Kelley and Alexandra Kelley's father that the next option would be for Alexandra Kelley to return home with home health services. Alexandra Kelley's father stated that he was informed by Alexandra Kelley's surgeon that Alexandra Kelley would not be discharging until Monday. CSW followed-up with [facility] and CSW Chiropodist with information above.  Hospitalist's MD and 6N unit Director have also informed of information above.  CSW contacted [facility] regarding possible weekend admission to their facility. Admissions coordinator stated that Alexandra Kelley and Alexandra Kelley's family would need to have made a decision by 5pm on 10/06/2013. CSW sent discharge summary and AVS to [facility] in the event that Alexandra Kelley and Alexandra Kelley's family change their mind and decide on [facility].  CSW to continue to follow and assist with discharge planning needs.  Darlyn Chamber, LCSWA Clinical Social Worker [phone]

## 2013-10-06 NOTE — Progress Notes (Signed)
Physical Therapy Treatment Patient Details Name: Alexandra Kelley MRN: [id number] DOB: 24-Nov-1980 Today's Date: 10/06/2013 Time: 4098-1191 PT Time Calculation (min): 41 min  PT Assessment / Plan / Recommendation  History of Present Illness IM nail L femur 2* fx   PT Comments   Pt making slow progress towards physical therapy goals. Pt was able to transfer to the [facility] and then to the recliner with static standing, and pulling the [facility] and chair up behind her. Practiced pre-gait in standing at EOB, however was unable to achieve stand-pivot transfer or clear the floor while maintaining PWB status at this time. Overall good rehab effort.   Follow Up Recommendations  SNF     Does the patient have the potential to tolerate intense rehabilitation     Barriers to Discharge        Equipment Recommendations  Rolling walker with 5" wheels;3in1 (PT);Wheelchair (measurements PT)    Recommendations for Other Services    Frequency Min 5X/week   Progress towards PT Goals Progress towards PT goals: Progressing toward goals  Plan Current plan remains appropriate    Precautions / Restrictions Precautions Precautions: Fall Restrictions Weight Bearing Restrictions: Yes LLE Weight Bearing: Partial weight bearing LLE Partial Weight Bearing Percentage or Pounds: 50%   Pertinent Vitals/Pain Pt reports 10/10 pain at beginning of session and received pain medication from RN.     Mobility  Bed Mobility Bed Mobility: Supine to Sit;Sitting - Scoot to Edge of Bed Supine to Sit: HOB elevated;1: +2 Total assist Supine to Sit: Patient Percentage: 60% Sitting - Scoot to Edge of Bed: 1: +2 Total assist Sitting - Scoot to Edge of Bed: Patient Percentage: 70% Details for Bed Mobility Assistance: VC's for sequencing and technique. Increased time needed to perform transition to EOB, with bed pad use to assist in scooting all the way to EOB.  Transfers Transfers: Sit to Stand;Stand to Sit Sit to Stand: 1: +2  Total assist;From bed;With upper extremity assist;From chair/3-in-1 Sit to Stand: Patient Percentage: 70% Stand to Sit: 4: Min assist;To chair/3-in-1;With upper extremity assist Stand to Sit: Patient Percentage: 80% Details for Transfer Assistance: VC's for hand placement on seated surface. Pt required increased assist initially to stand, however was able to remain standing with +1 assist.  Ambulation/Gait Ambulation/Gait Assistance: Not tested (comment) Ambulation/Gait Assistance Details: Unable at this time. Pt performed pre-gait activity in standing including sliding of feet laterally to attempt side-steps, however pt could not clear the floor, or maintain PWB status at this time.     Exercises     PT Diagnosis:    PT Problem List:   PT Treatment Interventions:     PT Goals (current goals can now be found in the care plan section) Acute Rehab PT Goals Patient Stated Goal: do dishes PT Goal Formulation: With patient/family Time For Goal Achievement: 10/18/13 Potential to Achieve Goals: Good  Visit Information  Last PT Received On: 10/06/13 Assistance Needed: +2 History of Present Illness: IM nail L femur 2* fx    Subjective Data  Subjective: "I can try to use the bathroom if they want me to." Patient Stated Goal: do dishes   Cognition  Cognition Arousal/Alertness: Awake/alert Behavior During Therapy: WFL for tasks assessed/performed Overall Cognitive Status: Within Functional Limits for tasks assessed    Balance  Balance Balance Assessed: Yes Static Sitting Balance Static Sitting - Balance Support: Feet supported;Bilateral upper extremity supported Static Sitting - Level of Assistance: 5: Stand by assistance Static Standing Balance Static Standing -  Balance Support: Bilateral upper extremity supported Static Standing - Level of Assistance: 4: Min assist  End of Session PT - End of Session Equipment Utilized During Treatment: Gait belt Activity Tolerance: Patient  limited by lethargy;Patient limited by fatigue;Patient limited by pain Patient left: in chair;with call bell/phone within reach;with family/visitor present Nurse Communication: Mobility status   GP     Ruthann Cancer 10/06/2013, 3:00 PM  Ruthann Cancer, PT, DPT [phone]

## 2013-10-07 LAB — BASIC METABOLIC PANEL
CO2: 33 mEq/L — ABNORMAL HIGH (ref 19–32)
Calcium: 9.3 mg/dL (ref 8.4–10.5)
Chloride: 95 mEq/L — ABNORMAL LOW (ref 96–112)
GFR calc Af Amer: >90 mL/min (ref >90)
Glucose, Bld: 277 mg/dL — ABNORMAL HIGH (ref 70–99)
Potassium: 4.6 mEq/L (ref 3.5–5.1)
Sodium: 137 mEq/L (ref 135–145)

## 2013-10-07 LAB — GLUCOSE, CAPILLARY: Glucose-Capillary: 152 mg/dL — ABNORMAL HIGH (ref 70–99)

## 2013-10-07 NOTE — Progress Notes (Signed)
While RN was on lunch break, 2 NTs tried to place patient on the bsc- Patient's mother suggested that she use her walker, but patient felt that she would be able to make it without it. Patient was unable to bear much weight on her strong leg and she begun to lean back. Both techs were able to safely lower patient to the floor- preventing a fall. 2 RN's went into patient's room in order to help. The sara lift was brought in and patient was assisted onto it. Patient was able to use the bathroom without an issue afterwards and was safely placed back into the bed. Patient's mother warned patient that the next time she wants to get up out of bed, she will need to use the walker. Educated both mother and daughter on the importance of using the walker in order for patient to be safe. Will continue to monitor

## 2013-10-07 NOTE — Progress Notes (Signed)
[facility]                                                                                Patient Demographics  Alexandra Kelley, is a 32 y.o. female, DOB - 09-16-1981, ZOX:[id number]  Admit date - 10/02/2013   Admitting Physician Lynden Oxford, MD  Outpatient Primary MD for the patient is Colette Ribas, MD  LOS - 5   Chief Complaint  Patient presents with  . Fall  . Leg Pain        Assessment & Plan  Principal Problem:   Closed displaced oblique fracture of shaft of left femur Active Problems:   Hypothyroidism   Diabetes   Acute urinary retention   Acute-on-chronic respiratory failure  Closed displaced oblique fracture of shaft of left femur  -Status post surgery 10/03/13.  -Plan as per orthopedics.  -SNF as recommended by PT.   Acute urinary retention  -Will discontinue foley catheter today.   -Continue tamsulosin.   Acute on Chronic Hypoxemic/Hypercarbic Respiratory Failure  -Sats drop into the 80s on room air even at rest.   -CT chest on 10/03/13 was negative for PE.  -CXR without ATX/effusion/infiltrate.  -ABG shows a respiratory acidosis that is partially compensated.  -Suspect related to obstructive sleep apnea/obesity hypoventilation syndrome and will benefit from outpatient sleep study after discharge.  -Continue CPAP at night.  Hypothyroidism  -Continue Synthroid.   Hyperglycemia in the setting of Diabetes mellitus  -CBGs remained elevated likely secondary to increased stress -HbA1c 7.6 -Diabetes coordinator following -Continue novolog 4 units with meals, Lantus 20units QHS, and ISS -CBG (last 3)   Recent Labs  10/06/13 1206 10/06/13 1659 10/06/13 2150  GLUCAP 224* 251* 268*   Sinus tachycardia  -Suspect related to pain.  -No need for acute intervention.   Acute Kidney Injury -Improving, Cr 0.79 today -Baseline creatinine of 0.6.  Code Status: Full code   Family Communication: Father at bedside.  Disposition Plan: Likely  SNF once medically stable and bed becomes available.  Currently one bed has become available, however, father does not believe that the facility will be able to offer the patient the necessary care needed.  We will continue to look for SNF placement.  At this time, hyperglycemia needs to be better controlled, as does her AKI will need to resolve.    Had extensive conversation with the father.  The patient will remain in the hospital until she is medically stable for discharge and she has a bed offer at a SNF.  If a bed does not become available, patient will be discharged home with home health once stable.  Father understands and agrees.  Procedures  Left Intramedullary retrograde femoral nailing  Consults   Orthopedics  DVT Prophylaxis  SCDs   Lab Results  Component Value Date   PLT 244 10/06/2013    Medications  Scheduled Meds: . aspirin EC  325 mg Oral Q breakfast  . docusate sodium  100 mg Oral BID  . insulin aspart  0-20 Units Subcutaneous TID WC  . insulin aspart  4 Units Subcutaneous TID WC  . insulin detemir  20 Units Subcutaneous QHS  . levothyroxine  137 mcg Oral  QAC breakfast  . pantoprazole  40 mg Oral Q1200   Continuous Infusions: . sodium chloride 75 mL/hr at 10/07/13 1010   PRN Meds:.acetaminophen, acetaminophen, bisacodyl, diphenhydrAMINE-zinc acetate, HYDROcodone-acetaminophen, menthol-cetylpyridinium, methocarbamol (ROBAXIN) IV, methocarbamol, metoCLOPramide (REGLAN) injection, metoCLOPramide, morphine injection, ondansetron (ZOFRAN) IV, ondansetron, phenol, polyethylene glycol  Antibiotics   Anti-infectives   Start     Dose/Rate Route Frequency Ordered Stop   10/03/13 1730  ceFAZolin (ANCEF) 3 g in dextrose 5 % 50 mL IVPB  Status:  Discontinued     3 g 160 mL/hr over 30 Minutes Intravenous  Once 10/03/13 1727 10/03/13 2155   10/03/13 0845  ciprofloxacin (CIPRO) tablet 500 mg     500 mg Oral  Once 10/03/13 0732 10/03/13 1024       Time Spent in minutes   25 minutes   Mohamedamin Nifong D.O. on 10/07/2013 at 11:23 AM  Between 7am to 7pm - Pager - [phone]  After 7pm go to [email or web address] - password [email or web address]  And look for the night coverage person covering for me after hours  [facility] Office  [phone]    Subjective:   Alexandra Kelley seen and examined today.  Patient still feels pain however improving slightly. Patient denies dizziness, chest pain, shortness of breath, abdominal pain, N/V/D/C, new weakness, numbess, tingling.    Objective:   Filed Vitals:   10/07/13 0447 10/07/13 0451 10/07/13 0614 10/07/13 0800  BP:  100/47 137/74   Pulse:  131 119   Temp:  97.4 F (36.3 C)    TempSrc:  Oral    Resp:  20  20  Height:      Weight: 149.9 kg (330 lb 7.5 oz)     SpO2:  94%  94%    Wt Readings from Last 3 Encounters:  10/07/13 149.9 kg (330 lb 7.5 oz)  10/07/13 149.9 kg (330 lb 7.5 oz)  01/27/13 148.78 kg (328 lb)     Intake/Output Summary (Last 24 hours) at 10/07/13 1123 Last data filed at 10/07/13 0300  Gross per 24 hour  Intake      0 ml  Output    400 ml  Net   -400 ml    Exam  General: Well developed, well nourished, NAD, appears stated age  HEENT: NCAT,  mucous membranes moist.   Neck: Supple, no JVD, no masses  Cardiovascular: S1 S2 auscultated, tachycardic, no rubs, murmurs or gallops  Respiratory: Clear to auscultation bilaterally with equal chest rise  Abdomen: Soft, obese, nontender, nondistended, + bowel sounds  Extremities: warm dry without cyanosis clubbing. Trace pitting edema in the lower extremities bilaterally  Neuro: AAOx3, cranial nerves grossly intact.   Skin: Without rashes exudates or nodules  Psych: Normal affect and demeanor with intact judgement and insight  Data Review   Micro Results Recent Results (from the past 240 hour(s))  MRSA PCR SCREENING     Status: None   Collection Time    10/03/13  2:57 PM      Result Value Range Status   MRSA by PCR NEGATIVE   NEGATIVE Final   Comment:            The GeneXpert MRSA Assay (FDA     approved for NASAL specimens     only), is one component of a     comprehensive MRSA colonization     surveillance program. It is not     intended to diagnose MRSA     infection nor to guide or  monitor treatment for     MRSA infections.    Radiology Reports Dg Chest 1 View  10/03/2013   CLINICAL DATA:  Fall, leg pain  EXAM: CHEST - 1 VIEW  COMPARISON:  None.  FINDINGS: Hypoaeration and portable technique. Cardiomediastinal contours are likely within normal range allowing for this. No common airspace opacity, pleural effusion, or pneumothorax. No acute osseous finding.  IMPRESSION: Hypoaeration.  No acute process identified.   Electronically Signed   By: Jearld Lesch M.D.   On: 10/03/2013 01:27   Dg Femur Left  10/03/2013   CLINICAL DATA:  Left femur fixation.  EXAM: DG C-ARM 61-120 MIN; LEFT FEMUR - 2 VIEW  COMPARISON:  Plain film of earlier today.  FINDINGS: A total of 5 intraoperative images. These demonstrate placement of an intra medullary rod with 1 proximal and 4 distal locking screws across the previously described femoral shaft fracture. Improved alignment with minimal residual displacement remaining. No acute hardware complication.  IMPRESSION: Intraoperative imaging of femoral fixation.   Electronically Signed   By: Jeronimo Greaves M.D.   On: 10/03/2013 19:57   Dg Femur Left  10/03/2013   CLINICAL DATA:  Fall, leg pain  EXAM: LEFT FEMUR - 2 VIEW  COMPARISON:  None.  FINDINGS: Comminuted predominantly oblique fracture through the mid and distal left femoral shaft. There is medial displacement and angulation of the distal component. Femoral head grossly remains seated within the acetabulum. The inferior margin of the fracture extends to the patellofemoral articulation anteriorly. No definite extension into the tibial femoral articulation. Small joint effusion.  IMPRESSION: Displaced complex fracture of the left  femur as above.   Electronically Signed   By: Jearld Lesch M.D.   On: 10/03/2013 01:26   Ct Angio Chest Pe W/cm &/or Wo Cm  10/03/2013   CLINICAL DATA:  Leg swelling  EXAM: CT ANGIOGRAPHY CHEST WITH CONTRAST  TECHNIQUE: Multidetector CT imaging of the chest was performed using the standard protocol during bolus administration of intravenous contrast. Multiplanar CT image reconstructions including MIPs were obtained to evaluate the vascular anatomy.  CONTRAST:  80mL OMNIPAQUE IOHEXOL 350 MG/ML SOLN  COMPARISON:  None.  FINDINGS: Contrast bolus timing is not optimized to evaluate for pulmonary embolism. No large/central filling defect. The lobar and more peripheral branches are nondiagnostic.  Heart size upper normal to mildly enlarged. Normal caliber aorta. No overt pleural or pericardial effusion. No lymphadenopathy. Limited upper abdominal images show hepatic steatosis.  Degraded by respiratory motion. Ground-glass opacity and mosaic attenuation, favored to reflect atelectasis/areas of air trapping. No confluent airspace opacity. No pneumothorax.  No acute osseous finding.  Review of the MIP images confirms the above findings.  IMPRESSION: Suboptimal contrast bolus timing. No central pulmonary embolism. Lobar or more peripheral branches are nondiagnostic.  Degraded by respiratory motion/expiratory phase. Mosaic attenuation suggests areas of air trapping and atelectasis.  Hepatic steatosis.   Electronically Signed   By: Jearld Lesch M.D.   On: 10/03/2013 03:11   Dg Chest Port 1 View  10/04/2013   CLINICAL DATA:  Hypoxemia  EXAM: PORTABLE CHEST - 1 VIEW  COMPARISON:  Portable chest x-ray of 10/03/2013 and CT angio chest of the same day  FINDINGS: The lungs are not well aerated with mild basilar volume loss. No focal infiltrate or effusion is seen by chest x-ray. The heart is within upper limits of normal.  IMPRESSION: Poor aeration with mild volume loss at the bases.   Electronically Signed   By: Renae Fickle  Gery Pray M.D.   On: 10/04/2013 16:06   Dg C-arm 61-120 Min  10/03/2013   CLINICAL DATA:  Left femur fixation.  EXAM: DG C-ARM 61-120 MIN; LEFT FEMUR - 2 VIEW  COMPARISON:  Plain film of earlier today.  FINDINGS: A total of 5 intraoperative images. These demonstrate placement of an intra medullary rod with 1 proximal and 4 distal locking screws across the previously described femoral shaft fracture. Improved alignment with minimal residual displacement remaining. No acute hardware complication.  IMPRESSION: Intraoperative imaging of femoral fixation.   Electronically Signed   By: Jeronimo Greaves M.D.   On: 10/03/2013 19:57    CBC  Recent Labs Lab 10/03/13 0014 10/04/13 0355 10/05/13 0431 10/06/13 0540  WBC 16.5* 14.4* 12.8* 14.7*  HGB 14.3 12.5 11.6* 12.2  HCT 41.9 38.0 36.1 35.9*  PLT 246 247 217 244  MCV 89.7 90.5 91.6 88.4  MCH 30.6 29.8 29.4 30.0  MCHC 34.1 32.9 32.1 34.0  RDW 14.7 15.0 14.6 14.2  LYMPHSABS 1.7  --   --   --   MONOABS 0.7  --   --   --   EOSABS 0.0  --   --   --   BASOSABS 0.0  --   --   --     Chemistries   Recent Labs Lab 10/03/13 0014 10/04/13 0355 10/05/13 0431 10/05/13 1258 10/05/13 1900 10/06/13 0540 10/07/13 0335  NA 137 137 135  --  128* 135 137  K 4.7 4.4 4.1  --  6.2* 4.3 4.6  CL 96 97 92*  --  86* 94* 95*  CO2 30 29 33*  --  29 31 33*  GLUCOSE 353* 285* 264* 526* 428* 230* 277*  BUN 13 6 4*  --  11 14 13   CREATININE 0.68 0.55 0.64  --  1.58* 1.22* 0.79  CALCIUM 9.6 9.1 9.2  --  9.7 9.9 9.3  AST 33  --   --   --   --   --   --   ALT 38*  --   --   --   --   --   --   ALKPHOS 86  --   --   --   --   --   --   BILITOT 0.4  --   --   --   --   --   --    ------------------------------------------------------------------------------------------------------------------ estimated creatinine clearance is 148.4 ml/min (by C-G formula based on Cr of  0.79). ------------------------------------------------------------------------------------------------------------------  Recent Labs  10/05/13 1900  HGBA1C 7.6*   ------------------------------------------------------------------------------------------------------------------ No results found for this basename: CHOL, HDL, LDLCALC, TRIG, CHOLHDL, LDLDIRECT,  in the last 72 hours ------------------------------------------------------------------------------------------------------------------ No results found for this basename: TSH, T4TOTAL, FREET3, T3FREE, THYROIDAB,  in the last 72 hours ------------------------------------------------------------------------------------------------------------------ No results found for this basename: VITAMINB12, FOLATE, FERRITIN, TIBC, IRON, RETICCTPCT,  in the last 72 hours  Coagulation profile No results found for this basename: INR, PROTIME,  in the last 168 hours  No results found for this basename: DDIMER,  in the last 72 hours  Cardiac Enzymes  Recent Labs Lab 10/03/13 0014  TROPONINI <0.30   ------------------------------------------------------------------------------------------------------------------ No components found with this basename: POCBNP,

## 2013-10-08 LAB — CBC
Hemoglobin: 10.8 g/dL — ABNORMAL LOW (ref 12.0–15.0)
MCH: 29.4 pg (ref 26.0–34.0)
MCHC: 31.6 g/dL (ref 30.0–36.0)
MCV: 93.2 fL (ref 78.0–100.0)
RDW: 15 % (ref 11.5–15.5)

## 2013-10-08 LAB — BASIC METABOLIC PANEL
BUN: 11 mg/dL (ref 6–23)
Calcium: 9.2 mg/dL (ref 8.4–10.5)
Creatinine, Ser: 0.69 mg/dL (ref 0.50–1.10)
GFR calc Af Amer: >90 mL/min (ref >90)
GFR calc non Af Amer: >90 mL/min (ref >90)
Glucose, Bld: 208 mg/dL — ABNORMAL HIGH (ref 70–99)
Potassium: 3.9 mEq/L (ref 3.5–5.1)
Sodium: 138 mEq/L (ref 135–145)

## 2013-10-08 LAB — GLUCOSE, CAPILLARY: Glucose-Capillary: 274 mg/dL — ABNORMAL HIGH (ref 70–99)

## 2013-10-08 NOTE — Progress Notes (Signed)
Physical Therapy Treatment Patient Details Name: Alexandra Kelley MRN: [id number] DOB: Jul 09, 1981 Today's Date: 10/08/2013 Time: 1440-1520 PT Time Calculation (min): 40 min  PT Assessment / Plan / Recommendation  History of Present Illness IM nail L femur 2* fx   PT Comments   Continuing to follow; Making good progress with mobility, able to walk to door today; Requires extensive step-by-step cueing for gait sequence and 50%PWB LLE -- used teach-back method, having pt instruct rehab tech on how to take steps with RW and keeping 50%PWB -- look forward to see if there is carryover  Lengthy discussion with pt's father re: dc planning; from the PT/mobility standpoint, I believe pt is turning a corner; she is more able to tolerate activity, walked this session; She will have adequate assist at home  Recommend comprehensive inpatient rehab (CIR) for post-acute therapy needs.  Please order Rehab Consult   Follow Up Recommendations  CIR     Does the patient have the potential to tolerate intense rehabilitation     Barriers to Discharge        Equipment Recommendations  Rolling walker with 5" wheels;3in1 (PT);Wheelchair (measurements PT)    Recommendations for Other Services Rehab consult  Frequency Min 5X/week   Progress towards PT Goals Progress towards PT goals: Progressing toward goals  Plan Current plan remains appropriate    Precautions / Restrictions Precautions Precautions: Fall Restrictions Weight Bearing Restrictions: Yes LLE Weight Bearing: Partial weight bearing LLE Partial Weight Bearing Percentage or Pounds: 50%   Pertinent Vitals/Pain LLE is painful, but pt did not rate pain; patient repositioned for comfort     Mobility  Bed Mobility Bed Mobility: Supine to Sit;Sitting - Scoot to Edge of Bed Supine to Sit: HOB elevated;1: +2 Total assist Supine to Sit: Patient Percentage: 70% Sitting - Scoot to Edge of Bed: 1: +2 Total assist Sitting - Scoot to Edge of Bed:  Patient Percentage: 70% Details for Bed Mobility Assistance: VC's for sequencing and technique. Increased time needed to perform transition to EOB, with bed pad use to assist in scooting all the way to EOB.  Transfers Transfers: Sit to Stand;Stand to Sit Sit to Stand: 1: +2 Total assist;From bed;With upper extremity assist;From chair/3-in-1 Sit to Stand: Patient Percentage: 70% Stand to Sit: 4: Min assist;To chair/3-in-1;With upper extremity assist Stand to Sit: Patient Percentage: 80% Details for Transfer Assistance: VC's for hand placement on seated surface. Pt required increased assist initially to stand, however was able to remain standing with +1 assist. Needs reinforcement of safe hand positioning with transferring sit <>stand; Definite need for phsyical assist to steady RW as pt puled up on it Ambulation/Gait Ambulation/Gait Assistance: 1: +2 Total assist Ambulation/Gait: Patient Percentage: 60% (progressing to 70%) Ambulation Distance (Feet): 8 Feet Assistive device: Rolling walker Ambulation/Gait Assistance Details: Verbal, Tactile and Demo cues for gait sequence and technique; Heavily cues to push down into RW to step RLE to unweigh LLE in stance to 50%; Difficult to ascertain if pt able to [facility] to 50%, but she did report her arms were tired, which means she did unweigh LLE at least some in stance Gait Pattern: Step-to pattern    Exercises General Exercises - Lower Extremity Ankle Circles/Pumps: AROM;Both;10 reps Quad Sets: AROM;Left;10 reps Heel Slides: AAROM;Left;10 reps Hip ABduction/ADduction: AAROM;Left;10 reps   PT Diagnosis:    PT Problem List:   PT Treatment Interventions:     PT Goals (current goals can now be found in the care plan section) Acute Rehab PT  Goals Patient Stated Goal: do dishes PT Goal Formulation: With patient/family Time For Goal Achievement: 10/18/13 Potential to Achieve Goals: Good  Visit Information  Last PT Received On:  10/08/13 Assistance Needed: +2 History of Present Illness: IM nail L femur 2* fx    Subjective Data  Subjective: very willing to work Patient Stated Goal: do dishes   Cognition  Cognition Arousal/Alertness: Awake/alert Behavior During Therapy: WFL for tasks assessed/performed Overall Cognitive Status: Within Functional Limits for tasks assessed Memory:  (Required repetition of gait sequence)    Balance  Balance Balance Assessed: Yes Static Standing Balance Static Standing - Balance Support: Bilateral upper extremity supported Static Standing - Level of Assistance: 4: Min assist  End of Session PT - End of Session Equipment Utilized During Treatment: Gait belt Activity Tolerance: Patient limited by lethargy;Patient limited by fatigue;Patient limited by pain Patient left: in chair;with call bell/phone within reach;with family/visitor present Nurse Communication: Mobility status   GP     Van Clines [facility] [place], [place] [phone]  10/08/2013, 4:31 PM

## 2013-10-08 NOTE — Progress Notes (Signed)
Patient refused CPAP tonight. There Is a machine in the room at this time. RN aware. Explained to Patient that if they changed their mind, to just have the RN call Respiratory and we would come set them up. Patient was having difficulty with muscle and leg spasms and pain management.

## 2013-10-08 NOTE — Progress Notes (Signed)
[facility]                                                                                Patient Demographics  Alexandra Kelley, is a 32 y.o. female, DOB - Sep 04, 1981, [id number]  Admit date - 10/02/2013   Admitting Physician Lynden Oxford, MD  Outpatient Primary MD for the patient is Colette Ribas, MD  LOS - 6   Chief Complaint  Patient presents with  . Fall  . Leg Pain        Assessment & Plan  Principal Problem:   Closed displaced oblique fracture of shaft of left femur Active Problems:   Hypothyroidism   Diabetes   Acute urinary retention   Acute-on-chronic respiratory failure  Closed displaced oblique fracture of shaft of left femur  -Status post surgery 10/03/13.  -Plan as per orthopedics.  -SNF as recommended by PT.  -Spoke with physical therapy this morning, patient does seem to be improved. Will reassess 10/09/2013.  Acute urinary retention  -Will discontinue foley catheter today.   -Continue tamsulosin.   Acute on Chronic Hypoxemic/Hypercarbic Respiratory Failure  -Sats drop into the 80s on room air even at rest.   -CT chest on 10/03/13 was negative for PE.  -CXR without ATX/effusion/infiltrate.  -ABG shows a respiratory acidosis that is partially compensated.  -Suspect related to obstructive sleep apnea/obesity hypoventilation syndrome and will benefit from outpatient sleep study after discharge.  -Continue CPAP at night.  Hypothyroidism  -Continue Synthroid.   Hyperglycemia in the setting of Diabetes mellitus  -CBGs remained elevated likely secondary to increased stress -HbA1c 7.6 -Diabetes coordinator following -Continue novolog 4 units with meals, Lantus 20units QHS, and ISS -CBG (last 3)   Recent Labs  10/07/13 1647 10/07/13 2112 10/08/13 0801  GLUCAP 262* 152* 172*   Sinus tachycardia  -Suspect related to pain.  -No need for acute intervention.   Acute Kidney Injury -Improving, Cr 0.69 today -Baseline creatinine of  0.6.  Code Status: Full code   Family Communication: Mother at bedside at bedside.  Disposition Plan: Likely SNF once medically stable and bed becomes available.  Currently one bed has become available, however, father does not believe that the facility will be able to offer the patient the necessary care needed.  We will continue to look for SNF placement.    Had extensive conversation with the father 10/06/2013.  The patient will remain in the hospital until she is medically stable for discharge and she has a bed offer at a SNF.  If a bed does not become available, patient will be discharged home with home health once stable.  Father understands and agrees.  Procedures  Left Intramedullary retrograde femoral nailing  Consults   Orthopedics  DVT Prophylaxis  SCDs   Lab Results  Component Value Date   PLT 264 10/08/2013    Medications  Scheduled Meds: . aspirin EC  325 mg Oral Q breakfast  . docusate sodium  100 mg Oral BID  . insulin aspart  0-20 Units Subcutaneous TID WC  . insulin aspart  4 Units Subcutaneous TID WC  . insulin detemir  20 Units Subcutaneous QHS  . levothyroxine  137 mcg Oral  QAC breakfast  . pantoprazole  40 mg Oral Q1200   Continuous Infusions:   PRN Meds:.acetaminophen, acetaminophen, bisacodyl, diphenhydrAMINE-zinc acetate, HYDROcodone-acetaminophen, menthol-cetylpyridinium, methocarbamol (ROBAXIN) IV, methocarbamol, metoCLOPramide (REGLAN) injection, metoCLOPramide, morphine injection, ondansetron (ZOFRAN) IV, ondansetron, phenol, polyethylene glycol  Antibiotics   Anti-infectives   Start     Dose/Rate Route Frequency Ordered Stop   10/03/13 1730  ceFAZolin (ANCEF) 3 g in dextrose 5 % 50 mL IVPB  Status:  Discontinued     3 g 160 mL/hr over 30 Minutes Intravenous  Once 10/03/13 1727 10/03/13 2155   10/03/13 0845  ciprofloxacin (CIPRO) tablet 500 mg     500 mg Oral  Once 10/03/13 0732 10/03/13 1024       Time Spent in minutes  25  minutes   Bedie Dominey D.O. on 10/08/2013 at 10:53 AM  Between 7am to 7pm - Pager - [phone]  After 7pm go to [email or web address] - password [email or web address]  And look for the night coverage person covering for me after hours  [facility] Office  [phone]    Subjective:   Alexandra Kelley seen and examined today.  Patient still feels pain.  She states that her pain his worse when she tries getting up. However yesterday she was able to get up and into the chair. She does state that she also feels stronger today. Patient denies dizziness, chest pain, shortness of breath, abdominal pain, N/V/D/C, new weakness, numbess, tingling.    Objective:   Filed Vitals:   10/08/13 0000 10/08/13 0400 10/08/13 0616 10/08/13 0800  BP:   111/56   Pulse:   105   Temp:   97.3 F (36.3 C)   TempSrc:   Oral   Resp: 20 20 20 16   Height:      Weight:      SpO2: 97% 95% 99%     Wt Readings from Last 3 Encounters:  10/07/13 149.9 kg (330 lb 7.5 oz)  10/07/13 149.9 kg (330 lb 7.5 oz)  01/27/13 148.78 kg (328 lb)     Intake/Output Summary (Last 24 hours) at 10/08/13 1053 Last data filed at 10/07/13 1909  Gross per 24 hour  Intake   1140 ml  Output      0 ml  Net   1140 ml    Exam  General: Well developed, well nourished, NAD, appears stated age  HEENT: NCAT,  mucous membranes moist.   Neck: Supple, no JVD, no masses  Cardiovascular: S1 S2 auscultated, tachycardic, no rubs, murmurs or gallops  Respiratory: Clear to auscultation bilaterally with equal chest rise  Abdomen: Soft, obese, nontender, nondistended, + bowel sounds  Extremities: warm dry without cyanosis clubbing. Trace pitting edema in the lower extremities bilaterally  Neuro: AAOx3, cranial nerves grossly intact.   Skin: Without rashes exudates or nodules  Psych: Normal affect and demeanor with intact judgement and insight  Data Review   Micro Results Recent Results (from the past 240 hour(s))  MRSA PCR  SCREENING     Status: None   Collection Time    10/03/13  2:57 PM      Result Value Range Status   MRSA by PCR NEGATIVE  NEGATIVE Final   Comment:            The GeneXpert MRSA Assay (FDA     approved for NASAL specimens     only), is one component of a     comprehensive MRSA colonization     surveillance program. It is not  intended to diagnose MRSA     infection nor to guide or     monitor treatment for     MRSA infections.    Radiology Reports Dg Chest 1 View  10/03/2013   CLINICAL DATA:  Fall, leg pain  EXAM: CHEST - 1 VIEW  COMPARISON:  None.  FINDINGS: Hypoaeration and portable technique. Cardiomediastinal contours are likely within normal range allowing for this. No common airspace opacity, pleural effusion, or pneumothorax. No acute osseous finding.  IMPRESSION: Hypoaeration.  No acute process identified.   Electronically Signed   By: Jearld Lesch M.D.   On: 10/03/2013 01:27   Dg Femur Left  10/03/2013   CLINICAL DATA:  Left femur fixation.  EXAM: DG C-ARM 61-120 MIN; LEFT FEMUR - 2 VIEW  COMPARISON:  Plain film of earlier today.  FINDINGS: A total of 5 intraoperative images. These demonstrate placement of an intra medullary rod with 1 proximal and 4 distal locking screws across the previously described femoral shaft fracture. Improved alignment with minimal residual displacement remaining. No acute hardware complication.  IMPRESSION: Intraoperative imaging of femoral fixation.   Electronically Signed   By: Jeronimo Greaves M.D.   On: 10/03/2013 19:57   Dg Femur Left  10/03/2013   CLINICAL DATA:  Fall, leg pain  EXAM: LEFT FEMUR - 2 VIEW  COMPARISON:  None.  FINDINGS: Comminuted predominantly oblique fracture through the mid and distal left femoral shaft. There is medial displacement and angulation of the distal component. Femoral head grossly remains seated within the acetabulum. The inferior margin of the fracture extends to the patellofemoral articulation anteriorly. No  definite extension into the tibial femoral articulation. Small joint effusion.  IMPRESSION: Displaced complex fracture of the left femur as above.   Electronically Signed   By: Jearld Lesch M.D.   On: 10/03/2013 01:26   Ct Angio Chest Pe W/cm &/or Wo Cm  10/03/2013   CLINICAL DATA:  Leg swelling  EXAM: CT ANGIOGRAPHY CHEST WITH CONTRAST  TECHNIQUE: Multidetector CT imaging of the chest was performed using the standard protocol during bolus administration of intravenous contrast. Multiplanar CT image reconstructions including MIPs were obtained to evaluate the vascular anatomy.  CONTRAST:  80mL OMNIPAQUE IOHEXOL 350 MG/ML SOLN  COMPARISON:  None.  FINDINGS: Contrast bolus timing is not optimized to evaluate for pulmonary embolism. No large/central filling defect. The lobar and more peripheral branches are nondiagnostic.  Heart size upper normal to mildly enlarged. Normal caliber aorta. No overt pleural or pericardial effusion. No lymphadenopathy. Limited upper abdominal images show hepatic steatosis.  Degraded by respiratory motion. Ground-glass opacity and mosaic attenuation, favored to reflect atelectasis/areas of air trapping. No confluent airspace opacity. No pneumothorax.  No acute osseous finding.  Review of the MIP images confirms the above findings.  IMPRESSION: Suboptimal contrast bolus timing. No central pulmonary embolism. Lobar or more peripheral branches are nondiagnostic.  Degraded by respiratory motion/expiratory phase. Mosaic attenuation suggests areas of air trapping and atelectasis.  Hepatic steatosis.   Electronically Signed   By: Jearld Lesch M.D.   On: 10/03/2013 03:11   Dg Chest Port 1 View  10/04/2013   CLINICAL DATA:  Hypoxemia  EXAM: PORTABLE CHEST - 1 VIEW  COMPARISON:  Portable chest x-ray of 10/03/2013 and CT angio chest of the same day  FINDINGS: The lungs are not well aerated with mild basilar volume loss. No focal infiltrate or effusion is seen by chest x-ray. The heart is  within upper limits of normal.  IMPRESSION: Poor  aeration with mild volume loss at the bases.   Electronically Signed   By: Dwyane Dee M.D.   On: 10/04/2013 16:06   Dg C-arm 61-120 Min  10/03/2013   CLINICAL DATA:  Left femur fixation.  EXAM: DG C-ARM 61-120 MIN; LEFT FEMUR - 2 VIEW  COMPARISON:  Plain film of earlier today.  FINDINGS: A total of 5 intraoperative images. These demonstrate placement of an intra medullary rod with 1 proximal and 4 distal locking screws across the previously described femoral shaft fracture. Improved alignment with minimal residual displacement remaining. No acute hardware complication.  IMPRESSION: Intraoperative imaging of femoral fixation.   Electronically Signed   By: Jeronimo Greaves M.D.   On: 10/03/2013 19:57    CBC  Recent Labs Lab 10/03/13 0014 10/04/13 0355 10/05/13 0431 10/06/13 0540 10/08/13 0450  WBC 16.5* 14.4* 12.8* 14.7* 14.5*  HGB 14.3 12.5 11.6* 12.2 10.8*  HCT 41.9 38.0 36.1 35.9* 34.2*  PLT 246 247 217 244 264  MCV 89.7 90.5 91.6 88.4 93.2  MCH 30.6 29.8 29.4 30.0 29.4  MCHC 34.1 32.9 32.1 34.0 31.6  RDW 14.7 15.0 14.6 14.2 15.0  LYMPHSABS 1.7  --   --   --   --   MONOABS 0.7  --   --   --   --   EOSABS 0.0  --   --   --   --   BASOSABS 0.0  --   --   --   --     Chemistries   Recent Labs Lab 10/03/13 0014  10/05/13 0431 10/05/13 1258 10/05/13 1900 10/06/13 0540 10/07/13 0335 10/08/13 0450  NA 137  < > 135  --  128* 135 137 138  K 4.7  < > 4.1  --  6.2* 4.3 4.6 3.9  CL 96  < > 92*  --  86* 94* 95* 93*  CO2 30  < > 33*  --  29 31 33* 36*  GLUCOSE 353*  < > 264* 526* 428* 230* 277* 208*  BUN 13  < > 4*  --  11 14 13 11   CREATININE 0.68  < > 0.64  --  1.58* 1.22* 0.79 0.69  CALCIUM 9.6  < > 9.2  --  9.7 9.9 9.3 9.2  AST 33  --   --   --   --   --   --   --   ALT 38*  --   --   --   --   --   --   --   ALKPHOS 86  --   --   --   --   --   --   --   BILITOT 0.4  --   --   --   --   --   --   --   < > = values in this  interval not displayed. ------------------------------------------------------------------------------------------------------------------ estimated creatinine clearance is 148.4 ml/min (by C-G formula based on Cr of 0.69). ------------------------------------------------------------------------------------------------------------------  Recent Labs  10/05/13 1900  HGBA1C 7.6*   ------------------------------------------------------------------------------------------------------------------ No results found for this basename: CHOL, HDL, LDLCALC, TRIG, CHOLHDL, LDLDIRECT,  in the last 72 hours ------------------------------------------------------------------------------------------------------------------ No results found for this basename: TSH, T4TOTAL, FREET3, T3FREE, THYROIDAB,  in the last 72 hours ------------------------------------------------------------------------------------------------------------------ No results found for this basename: VITAMINB12, FOLATE, FERRITIN, TIBC, IRON, RETICCTPCT,  in the last 72 hours  Coagulation profile No results found for this basename: INR, PROTIME,  in the last 168 hours  No results found for this basename: DDIMER,  in the last 72 hours  Cardiac Enzymes  Recent Labs Lab 10/03/13 0014  TROPONINI <0.30   ------------------------------------------------------------------------------------------------------------------ No components found with this basename: POCBNP,

## 2013-10-09 ENCOUNTER — Encounter (HOSPITAL_COMMUNITY): Payer: Self-pay | Admitting: *Deleted

## 2013-10-09 ENCOUNTER — Inpatient Hospital Stay (HOSPITAL_COMMUNITY)
Admission: RE | Admit: 2013-10-09 | Discharge: 2013-10-20 | DRG: 946 | Disposition: A | Discharge status: Home / Self Care | Payer: Medicaid Other | Source: Intra-hospital | Attending: Physical Medicine & Rehabilitation | Admitting: Physical Medicine & Rehabilitation

## 2013-10-09 ENCOUNTER — Encounter (HOSPITAL_COMMUNITY): Payer: Self-pay | Admitting: Physical Medicine and Rehabilitation

## 2013-10-09 DIAGNOSIS — D72829 Elevated white blood cell count, unspecified: Secondary | ICD-10-CM

## 2013-10-09 DIAGNOSIS — E119 Type 2 diabetes mellitus without complications: Secondary | ICD-10-CM | POA: Diagnosis present

## 2013-10-09 DIAGNOSIS — Z7982 Long term (current) use of aspirin: Secondary | ICD-10-CM

## 2013-10-09 DIAGNOSIS — Z880 Allergy status to penicillin: Secondary | ICD-10-CM

## 2013-10-09 DIAGNOSIS — D62 Acute posthemorrhagic anemia: Secondary | ICD-10-CM

## 2013-10-09 DIAGNOSIS — R0902 Hypoxemia: Secondary | ICD-10-CM

## 2013-10-09 DIAGNOSIS — W19XXXA Unspecified fall, initial encounter: Secondary | ICD-10-CM

## 2013-10-09 DIAGNOSIS — S72309A Unspecified fracture of shaft of unspecified femur, initial encounter for closed fracture: Secondary | ICD-10-CM

## 2013-10-09 DIAGNOSIS — E669 Obesity, unspecified: Secondary | ICD-10-CM

## 2013-10-09 DIAGNOSIS — Y92009 Unspecified place in unspecified non-institutional (private) residence as the place of occurrence of the external cause: Secondary | ICD-10-CM

## 2013-10-09 DIAGNOSIS — R339 Retention of urine, unspecified: Secondary | ICD-10-CM

## 2013-10-09 DIAGNOSIS — R000 Tachycardia, unspecified: Secondary | ICD-10-CM

## 2013-10-09 DIAGNOSIS — S7292XA Unspecified fracture of left femur, initial encounter for closed fracture: Secondary | ICD-10-CM

## 2013-10-09 DIAGNOSIS — Z5189 Encounter for other specified aftercare: Principal | ICD-10-CM

## 2013-10-09 DIAGNOSIS — S7290XA Unspecified fracture of unspecified femur, initial encounter for closed fracture: Secondary | ICD-10-CM

## 2013-10-09 DIAGNOSIS — Z8249 Family history of ischemic heart disease and other diseases of the circulatory system: Secondary | ICD-10-CM

## 2013-10-09 DIAGNOSIS — W010XXA Fall on same level from slipping, tripping and stumbling without subsequent striking against object, initial encounter: Secondary | ICD-10-CM

## 2013-10-09 DIAGNOSIS — Z79899 Other long term (current) drug therapy: Secondary | ICD-10-CM

## 2013-10-09 DIAGNOSIS — E039 Hypothyroidism, unspecified: Secondary | ICD-10-CM | POA: Diagnosis present

## 2013-10-09 DIAGNOSIS — R259 Unspecified abnormal involuntary movements: Secondary | ICD-10-CM

## 2013-10-09 DIAGNOSIS — G4700 Insomnia, unspecified: Secondary | ICD-10-CM

## 2013-10-09 LAB — URINALYSIS, ROUTINE W REFLEX MICROSCOPIC
Bilirubin Urine: NEGATIVE
Glucose, UA: NEGATIVE mg/dL
Ketones, ur: NEGATIVE mg/dL
pH: 6.5 (ref 5.0–8.0)

## 2013-10-09 LAB — CBC
Hemoglobin: 11 g/dL — ABNORMAL LOW (ref 12.0–15.0)
MCV: 92.2 fL (ref 78.0–100.0)
Platelets: 295 10*3/uL (ref 150–400)
RBC: 3.73 MIL/uL — ABNORMAL LOW (ref 3.87–5.11)
RDW: 15.2 % (ref 11.5–15.5)
WBC: 12.6 10*3/uL — ABNORMAL HIGH (ref 4.0–10.5)

## 2013-10-09 LAB — GLUCOSE, CAPILLARY
Glucose-Capillary: 156 mg/dL — ABNORMAL HIGH (ref 70–99)
Glucose-Capillary: 206 mg/dL — ABNORMAL HIGH (ref 70–99)

## 2013-10-09 LAB — URINE MICROSCOPIC-ADD ON

## 2013-10-09 MED ORDER — DOCUSATE SODIUM 100 MG PO CAPS
100.0000 mg | ORAL_CAPSULE | Freq: Two times a day (BID) | ORAL | Status: DC
Start: 2013-10-09 — End: 2013-10-20
  Administered 2013-10-09 – 2013-10-20 (×22): 100 mg via ORAL
  Filled 2013-10-09 (×24): qty 1

## 2013-10-09 MED ORDER — METOCLOPRAMIDE HCL 5 MG PO TABS: 5.0000 mg | ORAL_TABLET | Freq: Three times a day (TID) | ORAL | Status: DC | PRN

## 2013-10-09 MED ORDER — TRAMADOL HCL 50 MG PO TABS
50.0000 mg | ORAL_TABLET | Freq: Four times a day (QID) | ORAL | Status: DC | PRN
  Administered 2013-10-10 – 2013-10-19 (×21): 50 mg via ORAL
  Filled 2013-10-09 (×21): qty 1

## 2013-10-09 MED ORDER — HYDROCODONE-ACETAMINOPHEN 5-325 MG PO TABS
1.0000 | ORAL_TABLET | Freq: Four times a day (QID) | ORAL | Status: DC | PRN
  Administered 2013-10-09 – 2013-10-12 (×12): 2 via ORAL
  Administered 2013-10-13: 1 via ORAL
  Administered 2013-10-13: 2 via ORAL
  Filled 2013-10-09: qty 2
  Filled 2013-10-09: qty 1
  Filled 2013-10-09 (×11): qty 2

## 2013-10-09 MED ORDER — LEVOTHYROXINE SODIUM 137 MCG PO TABS: 137.0000 ug | ORAL_TABLET | Freq: Every day | ORAL | Status: DC

## 2013-10-09 MED ORDER — INSULIN ASPART 100 UNIT/ML ~~LOC~~ SOLN
0.0000 [IU] | Freq: Three times a day (TID) | SUBCUTANEOUS | Status: DC
  Administered 2013-10-09: 7 [IU] via SUBCUTANEOUS
  Administered 2013-10-10 – 2013-10-11 (×3): 4 [IU] via SUBCUTANEOUS
  Administered 2013-10-11 – 2013-10-13 (×5): 3 [IU] via SUBCUTANEOUS
  Administered 2013-10-14 (×2): 4 [IU] via SUBCUTANEOUS
  Administered 2013-10-14: 3 [IU] via SUBCUTANEOUS
  Administered 2013-10-15: 4 [IU] via SUBCUTANEOUS
  Administered 2013-10-15 – 2013-10-17 (×5): 3 [IU] via SUBCUTANEOUS
  Administered 2013-10-17: 4 [IU] via SUBCUTANEOUS
  Administered 2013-10-17: 3 [IU] via SUBCUTANEOUS
  Administered 2013-10-18: 7 [IU] via SUBCUTANEOUS
  Administered 2013-10-18 (×2): 4 [IU] via SUBCUTANEOUS
  Administered 2013-10-19 – 2013-10-20 (×2): 3 [IU] via SUBCUTANEOUS

## 2013-10-09 MED ORDER — GLIMEPIRIDE 4 MG PO TABS
4.0000 mg | ORAL_TABLET | Freq: Two times a day (BID) | ORAL | Status: DC
  Administered 2013-10-09 – 2013-10-20 (×22): 4 mg via ORAL
  Filled 2013-10-09 (×24): qty 1

## 2013-10-09 MED ORDER — GUAIFENESIN-DM 100-10 MG/5ML PO SYRP
5.0000 mL | ORAL_SOLUTION | Freq: Four times a day (QID) | ORAL | Status: DC | PRN
  Filled 2013-10-09: qty 10

## 2013-10-09 MED ORDER — BISACODYL 10 MG RE SUPP: 10.0000 mg | Freq: Every day | RECTAL | Status: DC | PRN

## 2013-10-09 MED ORDER — TRAZODONE HCL 50 MG PO TABS
25.0000 mg | ORAL_TABLET | Freq: Every evening | ORAL | Status: DC | PRN
  Administered 2013-10-11: 25 mg via ORAL
  Administered 2013-10-13: 50 mg via ORAL
  Filled 2013-10-09 (×2): qty 1

## 2013-10-09 MED ORDER — INSULIN ASPART 100 UNIT/ML ~~LOC~~ SOLN: 4.0000 [IU] | Freq: Three times a day (TID) | SUBCUTANEOUS | Status: DC

## 2013-10-09 MED ORDER — ASPIRIN 325 MG PO TBEC: 325.0000 mg | DELAYED_RELEASE_TABLET | Freq: Every day | ORAL | Status: DC

## 2013-10-09 MED ORDER — PHENOL 1.4 % MT LIQD: 1.0000 | OROMUCOSAL | Status: DC | PRN

## 2013-10-09 MED ORDER — LEVOTHYROXINE SODIUM 137 MCG PO TABS
137.0000 ug | ORAL_TABLET | Freq: Every day | ORAL | Status: DC
  Administered 2013-10-10 – 2013-10-20 (×11): 137 ug via ORAL
  Filled 2013-10-09 (×13): qty 1

## 2013-10-09 MED ORDER — DIPHENHYDRAMINE-ZINC ACETATE 2-0.1 % EX CREA: TOPICAL_CREAM | CUTANEOUS | Status: DC | PRN

## 2013-10-09 MED ORDER — INSULIN ASPART 100 UNIT/ML ~~LOC~~ SOLN
4.0000 [IU] | Freq: Three times a day (TID) | SUBCUTANEOUS | Status: DC
  Administered 2013-10-10 (×2): 4 [IU] via SUBCUTANEOUS

## 2013-10-09 MED ORDER — PANTOPRAZOLE SODIUM 40 MG PO TBEC
40.0000 mg | DELAYED_RELEASE_TABLET | Freq: Every day | ORAL | Status: DC
  Administered 2013-10-10 – 2013-10-20 (×11): 40 mg via ORAL
  Filled 2013-10-09 (×12): qty 1

## 2013-10-09 MED ORDER — PANTOPRAZOLE SODIUM 40 MG PO TBEC: 40.0000 mg | DELAYED_RELEASE_TABLET | Freq: Every day | ORAL | Status: DC

## 2013-10-09 MED ORDER — ALUM & MAG HYDROXIDE-SIMETH 200-200-20 MG/5ML PO SUSP: 30.0000 mL | ORAL | Status: DC | PRN

## 2013-10-09 MED ORDER — ENOXAPARIN SODIUM 40 MG/0.4ML ~~LOC~~ SOLN
40.0000 mg | SUBCUTANEOUS | Status: DC
  Administered 2013-10-09 – 2013-10-10 (×2): 40 mg via SUBCUTANEOUS
  Filled 2013-10-09 (×3): qty 0.4

## 2013-10-09 MED ORDER — INSULIN ASPART 100 UNIT/ML ~~LOC~~ SOLN
0.0000 [IU] | Freq: Every day | SUBCUTANEOUS | Status: DC
  Administered 2013-10-09: 3 [IU] via SUBCUTANEOUS
  Administered 2013-10-16: 4 [IU] via SUBCUTANEOUS
  Administered 2013-10-18: 2 [IU] via SUBCUTANEOUS

## 2013-10-09 MED ORDER — MENTHOL 3 MG MT LOZG: 1.0000 | LOZENGE | OROMUCOSAL | Status: DC | PRN

## 2013-10-09 MED ORDER — METHOCARBAMOL 500 MG PO TABS: 500.0000 mg | ORAL_TABLET | Freq: Four times a day (QID) | ORAL | Status: DC | PRN

## 2013-10-09 MED ORDER — METHOCARBAMOL 500 MG PO TABS
500.0000 mg | ORAL_TABLET | Freq: Four times a day (QID) | ORAL | Status: DC
  Administered 2013-10-09 – 2013-10-11 (×5): 500 mg via ORAL
  Filled 2013-10-09 (×12): qty 1

## 2013-10-09 MED ORDER — DIPHENHYDRAMINE-ZINC ACETATE 2-0.1 % EX CREA
TOPICAL_CREAM | CUTANEOUS | Status: DC | PRN
  Filled 2013-10-09: qty 28

## 2013-10-09 MED ORDER — DSS 100 MG PO CAPS: 100.0000 mg | ORAL_CAPSULE | Freq: Two times a day (BID) | ORAL | Status: DC

## 2013-10-09 MED ORDER — HYDROCODONE-ACETAMINOPHEN 5-325 MG PO TABS
1.0000 | ORAL_TABLET | ORAL | Status: DC | PRN
  Filled 2013-10-09: qty 2

## 2013-10-09 MED ORDER — METHOCARBAMOL 500 MG PO TABS
500.0000 mg | ORAL_TABLET | Freq: Four times a day (QID) | ORAL | Status: DC | PRN
  Administered 2013-10-09 – 2013-10-10 (×2): 500 mg via ORAL
  Filled 2013-10-09 (×2): qty 1

## 2013-10-09 MED ORDER — POLYETHYLENE GLYCOL 3350 17 G PO PACK: 17.0000 g | PACK | Freq: Every day | ORAL | Status: DC | PRN

## 2013-10-09 MED ORDER — INSULIN DETEMIR 100 UNIT/ML ~~LOC~~ SOLN
20.0000 [IU] | Freq: Every day | SUBCUTANEOUS | Status: DC
  Administered 2013-10-09: 20 [IU] via SUBCUTANEOUS
  Filled 2013-10-09 (×2): qty 0.2

## 2013-10-09 MED ORDER — INSULIN DETEMIR 100 UNIT/ML ~~LOC~~ SOLN: 20.0000 [IU] | Freq: Every day | SUBCUTANEOUS | Status: DC

## 2013-10-09 MED ORDER — FLEET ENEMA 7-19 GM/118ML RE ENEM: 1.0000 | ENEMA | Freq: Once | RECTAL | Status: AC | PRN

## 2013-10-09 MED ORDER — POLYETHYLENE GLYCOL 3350 17 G PO PACK
17.0000 g | PACK | Freq: Every day | ORAL | Status: DC | PRN
  Filled 2013-10-09: qty 1

## 2013-10-09 NOTE — PMR Pre-admission (Signed)
PMR Admission Coordinator Pre-Admission Assessment  Patient: Alexandra Kelley is an 32 y.o., female MRN: [id number] DOB: 04-Feb-1981 Height: 5\' 3"  (160 cm) Weight: 149.9 kg (330 lb 7.5 oz) (up in the sara lift)              Insurance Information HMO:      PPO:       PCP:       IPA:       80/20:       OTHER:   PRIMARY: Medicaid [place] access      Policy#: [id number]      Subscriber: Earl Lagos CM Name:        Phone#:       Fax#:   Pre-Cert#:        Employer:  Disabled Benefits:  Phone #: [phone]      Name: Automated Eff. Date: Eligible 10/09/13     Deduct:        Out of Pocket Max:        Life Max:   CIR:        SNF:   Outpatient:       Co-Pay:   Home Health:        Co-Pay:   DME:       Co-Pay:   Providers:     Emergency Contact Information Contact Information   Name Relation Home Work Mobile   Kraai,Tim R Father [phone]  [phone]   Lokey,Tammy Mother   [phone]     Current Medical History  Patient Admitting Diagnosis:  L femoral shaft fracture after fall  History of Present Illness: A 32 y.o. female with history of DM, thyroid disease, morbid obesity, who was admitted on 10/03/13 past fall and subsequent closed displaced oblique fracture of shaft of left femur. Mechanical fall that occurred due to patient tripping when she got up from dinner table. Patient with hypoxia and CT chest negative for PE. She was evaluated by Dr. Ophelia Charter and underwent IM nailing for repair on the same day. Post op PWB and foley placed for urinary retention. PT evaluation done and patient limited by pain. Also continues to be Hypoxic requiring 3 L oxygen with activity. Leucocytosis resolving. Therapies initiated and patient limited by lethargy, pain and as well as PWB. MD, PT, family requesting CIR. SNF bed available today. Father reports that he's been assisting with transfers to chair and assisting to bathroom yesterday.  Family prefers acute inpatient rehab admission.     Past  Medical History  Past Medical History  Diagnosis Date  . Diabetes mellitus without complication   . Thyroid disease     Family History  family history includes Hypertension in her mother; Thyroid disease in her mother.  Prior Rehab/Hospitalizations:  None   Current Medications  Current facility-administered medications:acetaminophen (TYLENOL) suppository 650 mg, 650 mg, Rectal, Q6H PRN, Eldred Manges, MD;  acetaminophen (TYLENOL) tablet 650 mg, 650 mg, Oral, Q6H PRN, Eldred Manges, MD, 650 mg at 10/05/13 1851;  aspirin EC tablet 325 mg, 325 mg, Oral, Q breakfast, Eldred Manges, MD, 325 mg at 10/09/13 0825;  bisacodyl (DULCOLAX) suppository 10 mg, 10 mg, Rectal, Daily PRN, Eldred Manges, MD diphenhydrAMINE-zinc acetate (BENADRYL) 2-0.1 % cream, , Topical, PRN, Maryann Mikhail, DO;  docusate sodium (COLACE) capsule 100 mg, 100 mg, Oral, BID, Eldred Manges, MD, 100 mg at 10/09/13 1004;  HYDROcodone-acetaminophen (NORCO/VICODIN) 5-325 MG per tablet 1-2 tablet, 1-2 tablet, Oral, Q6H PRN, Eldred Manges, MD, 2 tablet  at 10/09/13 1208 insulin aspart (novoLOG) injection 0-20 Units, 0-20 Units, Subcutaneous, TID WC, Vassie Loll, MD, 4 Units at 10/09/13 0825;  insulin aspart (novoLOG) injection 4 Units, 4 Units, Subcutaneous, TID WC, Estela Isaiah Blakes, MD, 4 Units at 10/09/13 [id number];  insulin detemir (LEVEMIR) injection 20 Units, 20 Units, Subcutaneous, QHS, Henderson Cloud, MD, 20 Units at 10/08/13 2134 levothyroxine (SYNTHROID, LEVOTHROID) tablet 137 mcg, 137 mcg, Oral, QAC breakfast, Henderson Cloud, MD, 137 mcg at 10/09/13 0825;  menthol-cetylpyridinium (CEPACOL) lozenge 3 mg, 1 lozenge, Oral, PRN, Eldred Manges, MD;  methocarbamol (ROBAXIN) 500 mg in dextrose 5 % 50 mL IVPB, 500 mg, Intravenous, Q6H PRN, Eldred Manges, MD;  methocarbamol (ROBAXIN) tablet 500 mg, 500 mg, Oral, Q6H PRN, Eldred Manges, MD, 500 mg at 10/09/13 1004 metoCLOPramide (REGLAN) injection 5-10 mg, 5-10 mg,  Intravenous, Q8H PRN, Eldred Manges, MD, 5 mg at 10/05/13 1846;  metoCLOPramide (REGLAN) tablet 5-10 mg, 5-10 mg, Oral, Q8H PRN, Eldred Manges, MD;  morphine 2 MG/ML injection 2 mg, 2 mg, Intravenous, Q1H PRN, Eldred Manges, MD, 2 mg at 10/08/13 9604;  ondansetron [facility]) injection 4 mg, 4 mg, Intravenous, Q6H PRN, Eldred Manges, MD, 4 mg at 10/06/13 0119 ondansetron [facility]) tablet 4 mg, 4 mg, Oral, Q6H PRN, Eldred Manges, MD, 4 mg at 10/05/13 1631;  pantoprazole (PROTONIX) EC tablet 40 mg, 40 mg, Oral, Q1200, Vassie Loll, MD, 40 mg at 10/08/13 1226;  phenol (CHLORASEPTIC) mouth spray 1 spray, 1 spray, Mouth/Throat, PRN, Eldred Manges, MD;  polyethylene glycol (MIRALAX / GLYCOLAX) packet 17 g, 17 g, Oral, Daily PRN, Eldred Manges, MD  Patients Current Diet: Carb Control  Precautions / Restrictions Precautions Precautions: Fall Restrictions Weight Bearing Restrictions: Yes RUE Weight Bearing: Partial weight bearing LLE Weight Bearing: Partial weight bearing LLE Partial Weight Bearing Percentage or Pounds: 50%   Prior Activity Level Limited Community (1-2x/wk): Went out 3-4 X a week to [employer], [employer], into town.  Home Assistive Devices / Equipment Home Assistive Devices/Equipment: None Home Equipment: None  Prior Functional Level Prior Function Level of Independence: Needs assistance ADL's / Homemaking Assistance Needed: assistance with LB bathing  Current Functional Level Cognition  Overall Cognitive Status: Within Functional Limits for tasks assessed Orientation Level: Oriented to person;Oriented to place;Disoriented to time    Extremity Assessment (includes Sensation/Coordination)          ADLs  Grooming: Performed;Wash/dry face Where Assessed - Grooming: Unsupported sitting Upper Body Dressing: Set up;Supervision/safety Where Assessed - Upper Body Dressing: Unsupported sitting Lower Body Dressing: Maximal assistance Where Assessed - Lower Body Dressing: Supported sit  to stand Toilet Transfer: Minimal assistance;+2 Total assistance Toilet Transfer: Patient Percentage: 80% Toilet Transfer Method: Sit to stand;Stand pivot (Min A-sit to stand and +2 Total A-stand pivot) Toilet Transfer Equipment: Bedside commode Equipment Used: Gait belt;Reacher;Long-handled sponge;Long-handled shoe horn;Rolling walker;Sock aid;Other (comment) (O2) Transfers/Ambulation Related to ADLs: +2 Total A (80%) for stand pivot transfer-assist with RLE. Min A for sit <> stand transfer. ADL Comments: Educated on AE for LB ADLs. Pt practiced with sockaid. Educated on DME. Pt taking increased time with transfers.  Practiced transferring from chair to [facility].     Mobility  Bed Mobility: Supine to Sit;Sitting - Scoot to Edge of Bed Supine to Sit: HOB elevated;1: +2 Total assist Supine to Sit: Patient Percentage: 70% Sitting - Scoot to Edge of Bed: 1: +2 Total assist Sitting - Scoot to Edge of Bed:  Patient Percentage: 70% Sit to Supine: 3: Mod assist Scooting to HOB: 1: +2 Total assist;With rail Scooting to [facility]: Patient Percentage: 60%    Transfers  Transfers: Sit to Stand;Stand to Sit Sit to Stand: 1: +2 Total assist;From bed;With upper extremity assist;From chair/3-in-1 Sit to Stand: Patient Percentage: 70% Stand to Sit: 4: Min assist;To chair/3-in-1;With upper extremity assist Stand to Sit: Patient Percentage: 80% Stand Pivot Transfers: 1: +2 Total assist Stand Pivot Transfers: Patient Percentage: 80%    Ambulation / Gait / Stairs / Wheelchair Mobility  Ambulation/Gait Ambulation/Gait Assistance: 1: +2 Total assist Ambulation/Gait: Patient Percentage: 60% (progressing to 70%) Ambulation Distance (Feet): 8 Feet Assistive device: Rolling walker Ambulation/Gait Assistance Details: Verbal, Tactile and Demo cues for gait sequence and technique; Heavily cues to push down into RW to step RLE to unweigh LLE in stance to 50%; Difficult to ascertain if pt able to [facility] to 50%, but she did  report her arms were tired, which means she did unweigh LLE at least some in stance Gait Pattern: Step-to pattern    Posture / Balance Static Sitting Balance Static Sitting - Balance Support: Feet supported;Bilateral upper extremity supported Static Sitting - Level of Assistance: 5: Stand by assistance Static Sitting - Comment/# of Minutes: 3 minutes EOB Static Standing Balance Static Standing - Balance Support: Bilateral upper extremity supported Static Standing - Level of Assistance: 4: Min assist    Special needs/care consideration BiPAP/CPAP No CPM No Continuous Drip IV No Dialysis No         Life Vest No Oxygen: On 2L [place] on 10/09/13 Special Bed No Trach Size No Wound Vac (area) No      Skin L leg with repair after femur fracture                             Bowel mgmt: Had BM 10/08/13 Bladder mgmt: Voiding up on [facility] with assistance Diabetic mgmt Yes.  Was on oral medications at home    Previous Home Environment Living Arrangements: Parent Available Help at Discharge: Family Home Layout: One level Home Access: Stairs to enter Secretary/administrator of Steps: 2 Bathroom Shower/Tub: Engineer, manufacturing systems: Standard Home Care Services: No  Discharge Living Setting Plans for Discharge Living Setting: House;Lives with (comment) Type of Home at Discharge: House Discharge Home Layout: Two level;Able to live on main level with bedroom/bathroom Alternate Level Stairs-Number of Steps: Flight Discharge Home Access: Ramped entrance Does the patient have any problems obtaining your medications?: No  Social/Family/Support Systems Patient Roles: Other (Comment) (Lives with mom and dad.) Contact Information: Tim Buffalo - father and Tammy Haren - mother Anticipated Caregiver: Parents Anticipated Industrial/product designer Information: See emergency contackts Ability/Limitations of Caregiver: Dad drives a school bus, home 8:30 am to 2 pm.  Mom works 8 am to 6 pm.   Friends/neighbors can assist when dad working. Caregiver Availability: 24/7 Discharge Plan Discussed with Primary Caregiver: Yes Is Caregiver In Agreement with Plan?: Yes Does Caregiver/Family have Issues with Lodging/Transportation while Pt is in Rehab?: No  Goals/Additional Needs Patient/Family Goal for Rehab: PT/OT mod I to supervision, no ST needs Expected length of stay: 8-12 days Cultural Considerations: Disabled since birth. Dietary Needs: Carb mod med calorie, thin liquids Equipment Needs: TBD Pt/Family Agrees to Admission and willing to participate: Yes Program Orientation Provided & Reviewed with Pt/Caregiver Including Roles  & Responsibilities: Yes  Decrease burden of Care through IP rehab admission: N/A  Possible need  for SNF placement upon discharge: Not planned  Patient Condition: This patient's condition remains as documented in the consult dated 10/09/13, in which the Rehabilitation Physician determined and documented that the patient's condition is appropriate for intensive rehabilitative care in an inpatient rehabilitation facility. Will admit to inpatient rehab today.  Preadmission Screen Completed By:  Trish Mage, 10/09/2013 12:12 PM ______________________________________________________________________   Discussed status with Dr. Riley Kill on 10/09/13 at 1222 and received telephone approval for admission today.  Admission Coordinator:  Trish Mage, time1222/Date12/08/14

## 2013-10-09 NOTE — Progress Notes (Signed)
Occupational Therapy Treatment Patient Details Name: Alexandra Kelley MRN: [id number] DOB: 1981/02/27 Today's Date: 10/09/2013 Time: 4098-1191 OT Time Calculation (min): 31 min  OT Assessment / Plan / Recommendation  History of present illness IM nail L femur 2* fx   OT comments  Pt progressing towards goals and is showing great improvement. Practiced with AE for LB ADLs and performed grooming at sink.   Follow Up Recommendations  CIR;Supervision/Assistance - 24 hour    Barriers to Discharge       Equipment Recommendations  Other (comment) (large 3 in 1 bedside commode)    Recommendations for Other Services Rehab consult  Frequency Min 2X/week   Progress towards OT Goals Progress towards OT goals: Progressing toward goals  Plan Discharge plan remains appropriate    Precautions / Restrictions Precautions Precautions: Fall Restrictions Weight Bearing Restrictions: Yes LLE Weight Bearing: Partial weight bearing LLE Partial Weight Bearing Percentage or Pounds: 50%   Pertinent Vitals/Pain Pain in LLE. Nurse brought meds during session.     ADL  Grooming: Wash/dry face;Teeth care;Minimal assistance (Min A for balance while standing) Where Assessed - Grooming: Supported standing;Supported sitting (washed face-sitting and teeth-standing) Lower Body Dressing: Moderate assistance Where Assessed - Lower Body Dressing: Supported sit to Pharmacist, hospital: +2 Total assistance;Min guard (Min guard-stand to sit) Toilet Transfer: Patient Percentage: 70% Toilet Transfer Method: Sit to Barista: Bedside commode Equipment Used: Gait belt;Reacher;Rolling walker;Sock aid Transfers/Ambulation Related to ADLs: +2 assist for ambulation for safety-cues for technique. Min guard for stand to sit transfers and +2 Total A (60%) from recliner and 70% from [facility]. ADL Comments: Pt practiced with AE for LB ADLs- donned underwear and donned/doffed socks. Pt performed grooming at  sink.     OT Diagnosis:    OT Problem List:   OT Treatment Interventions:     OT Goals(current goals can now be found in the care plan section) Acute Rehab OT Goals Patient Stated Goal: not stated OT Goal Formulation: With patient Time For Goal Achievement: 10/11/13 Potential to Achieve Goals: Good ADL Goals Pt Will Perform Grooming: with set-up;standing;with supervision Pt Will Perform Lower Body Bathing: with set-up;with supervision;with adaptive equipment;sit to/from stand Pt Will Perform Lower Body Dressing: with set-up;with supervision;with adaptive equipment;sit to/from stand Pt Will Transfer to Toilet: with supervision;ambulating (3 in 1 over commode) Pt Will Perform Toileting - Clothing Manipulation and hygiene: with supervision;sit to/from stand Pt Will Perform Tub/Shower Transfer: Tub transfer;with supervision;ambulating;rolling walker (tub equipment tbd)  Visit Information  Last OT Received On: 10/09/13 Assistance Needed: +2 History of Present Illness: IM nail L femur 2* fx    Subjective Data      Prior Functioning       Cognition  Cognition Arousal/Alertness: Awake/alert Behavior During Therapy: WFL for tasks assessed/performed Overall Cognitive Status: Within Functional Limits for tasks assessed    Mobility  Bed Mobility Bed Mobility: Scooting to HOB;Sit to Supine Sit to Supine: 1: +2 Total assist Sit to Supine: Patient Percentage: 50% Scooting to HOB: 5: Supervision ([place] position) Details for Bed Mobility Assistance: Cues for technique. Transfers Transfers: Sit to Stand;Stand to Sit Sit to Stand: 1: +2 Total assist;With upper extremity assist;From chair/3-in-1 Sit to Stand: Patient Percentage: 60% (60% from recliner chair and 70% from [facility]) Stand to Sit: 4: Min guard;To chair/3-in-1;To bed Details for Transfer Assistance: Cues for hand placement.    Exercises      Balance     End of Session OT - End of Session Equipment  Utilized During  Treatment: Gait belt;Rolling walker;Oxygen Activity Tolerance: Patient limited by fatigue Patient left: in bed;with call bell/phone within reach;with family/visitor present Nurse Communication: Other (comment) (called for meds)  GO     Earlie Raveling OTR/L [phone] 10/09/2013, 1:05 PM

## 2013-10-09 NOTE — Progress Notes (Signed)
Rehab Admissions Coordinator Note:  Patient was screened by Trish Mage for appropriateness for an Inpatient Acute Rehab Consult.  At this time, an inpatient rehab consult is pending completion today.  Trish Mage 10/09/2013, 8:20 AM  I can be reached at [phone].

## 2013-10-09 NOTE — Clinical Social Work Note (Signed)
CSW followed-up with [facility] (as requested by family on 10/06/2013). Admissions coordinator at [facility] stated that the facility did not have any female beds available. CSW updated MD, charge RN, and RNCM. CSW attempted to speak with family regarding information, but family was meeting with CIR. CSW continuing to follow behind CIR.  Darlyn Chamber, LCSWA Clinical Social Worker [phone]

## 2013-10-09 NOTE — Progress Notes (Signed)
Received patient from 6N.  Alert and oriented x4.  Vitals stable.  Patient complaining of pain 10/10 in left leg.  Patient and family oriented to room and unit; all questions answered.  Will continue to monitor.

## 2013-10-09 NOTE — Consult Note (Signed)
Physical Medicine and Rehabilitation Consult  Reason for Consult: left femur fracture Referring Physician: Dr. Catha Gosselin   HPI: Alexandra Kelley is a 32 y.o. female with history of DM, thyroid disease, morbid obesity,  who was admitted on 10/03/13 past fall and subsequent closed displaced oblique fracture of shaft of left femur.  Mechanical fall that occurred due to patient tripping when she got up from dinner table. Patient with hypoxia and CT chest negative for PE. She was evaluated by Dr. Ophelia Charter and underwent IM nailing for repair on the same day. Post op PWB and foley placed for urinary retention. PT evaluation done and patient limited by pain. Also continues to be  Hypoxic requiring 3 L oxygen with activity. Leucocytosis resolving. Therapies initiated and patient limited by lethargy, pain and as well as PWB.  MD, PT, family requesting CIR. SNF bed available today.  Father reports that he's been assisting with transfers to chair and assisting to bathroom yesterday.     Review of Systems  HENT: Negative for hearing loss.   Respiratory: Negative for cough and shortness of breath.   Cardiovascular: Negative for chest pain and palpitations.  Gastrointestinal: Negative for heartburn, nausea and abdominal pain.  Musculoskeletal: Positive for myalgias.  Neurological: Negative for headaches.   Past Medical History  Diagnosis Date  . Diabetes mellitus without complication   . Thyroid disease    Past Surgical History  Procedure Laterality Date  . Tubes in ears    . Back surgery    . Femur im nail Left 10/03/2013    Procedure: INTRAMEDULLARY (IM) RETROGRADE FEMORAL NAILING;  Surgeon: Eldred Manges, MD;  Location: [facility] OR;  Service: Orthopedics;  Laterality: Left;   Family History  Problem Relation Age of Onset  . Hypertension Mother   . Thyroid disease Mother     Social History:  Lives with family. Parents works . She reports that she has never smoked. She does not have any smokeless tobacco  history on file. She reports that she does not drink alcohol or use illicit drugs.   Allergies  Allergen Reactions  . Ceclor [Cefaclor]     Rash  . Penicillins Rash   Medications Prior to Admission  Medication Sig Dispense Refill  . fluticasone (FLONASE) 50 MCG/ACT nasal spray Place 2 sprays into the nose daily.      Marland Kitchen glimepiride (AMARYL) 4 MG tablet Take 4 mg by mouth 2 (two) times daily.      Marland Kitchen levothyroxine (SYNTHROID, LEVOTHROID) 137 MCG tablet Take 137 mcg by mouth daily.      . ranitidine (ZANTAC) 150 MG tablet Take 150 mg by mouth 2 (two) times daily as needed for heartburn.        Home: Home Living Family/patient expects to be discharged to:: Private residence Living Arrangements: Parent Available Help at Discharge: Family Home Access: Stairs to enter Secretary/administrator of Steps: 2 Home Layout: One level Home Equipment: None  Functional History:   Functional Status:  Mobility: Bed Mobility Bed Mobility: Supine to Sit;Sitting - Scoot to Edge of Bed Supine to Sit: HOB elevated;1: +2 Total assist Supine to Sit: Patient Percentage: 70% Sitting - Scoot to Edge of Bed: 1: +2 Total assist Sitting - Scoot to Edge of Bed: Patient Percentage: 70% Sit to Supine: 3: Mod assist Scooting to HOB: 1: +2 Total assist;With rail Scooting to [facility]: Patient Percentage: 60% Transfers Transfers: Sit to Stand;Stand to Sit Sit to Stand: 1: +2 Total assist;From bed;With upper extremity assist;From chair/3-in-1 Sit  to Stand: Patient Percentage: 70% Stand to Sit: 4: Min assist;To chair/3-in-1;With upper extremity assist Stand to Sit: Patient Percentage: 80% Stand Pivot Transfers: 1: +2 Total assist Stand Pivot Transfers: Patient Percentage: 80% Ambulation/Gait Ambulation/Gait Assistance: 1: +2 Total assist Ambulation/Gait: Patient Percentage: 60% (progressing to 70%) Ambulation Distance (Feet): 8 Feet Assistive device: Rolling walker Ambulation/Gait Assistance Details: Verbal,  Tactile and Demo cues for gait sequence and technique; Heavily cues to push down into RW to step RLE to unweigh LLE in stance to 50%; Difficult to ascertain if pt able to [facility] to 50%, but she did report her arms were tired, which means she did unweigh LLE at least some in stance Gait Pattern: Step-to pattern    ADL: ADL Grooming: Performed;Wash/dry face Where Assessed - Grooming: Unsupported sitting Upper Body Dressing: Set up;Supervision/safety Where Assessed - Upper Body Dressing: Unsupported sitting Lower Body Dressing: Maximal assistance Where Assessed - Lower Body Dressing: Supported sit to stand Toilet Transfer: Minimal assistance;+2 Total assistance Toilet Transfer Method: Sit to stand;Stand pivot (Min A-sit to stand and +2 Total A-stand pivot) Toilet Transfer Equipment: Bedside commode Equipment Used: Gait belt;Reacher;Long-handled sponge;Long-handled shoe horn;Rolling walker;Sock aid;Other (comment) (O2) Transfers/Ambulation Related to ADLs: +2 Total A (80%) for stand pivot transfer-assist with RLE. Min A for sit <> stand transfer. ADL Comments: Educated on AE for LB ADLs. Pt practiced with sockaid. Educated on DME. Pt taking increased time with transfers.  Practiced transferring from chair to [facility].   Cognition: Cognition Overall Cognitive Status: Within Functional Limits for tasks assessed Orientation Level: Oriented to person;Oriented to place;Disoriented to time Cognition Arousal/Alertness: Awake/alert Behavior During Therapy: WFL for tasks assessed/performed Overall Cognitive Status: Within Functional Limits for tasks assessed Memory:  (Required repetition of gait sequence)  Blood pressure 100/54, pulse 103, temperature 97.9 F (36.6 C), temperature source Oral, resp. rate 20, height 5\' 3"  (1.6 m), weight 149.9 kg (330 lb 7.5 oz), SpO2 98.00%. Physical Exam  Nursing note and vitals reviewed. Constitutional: She is oriented to person, place, and time. She appears  well-developed and well-nourished.  Morbid obesity  HENT:  Head: Normocephalic and atraumatic.  Eyes: Conjunctivae are normal. Pupils are equal, round, and reactive to light.  Neck: Normal range of motion. Neck supple.  Cardiovascular: Normal rate, regular rhythm and normal heart sounds.   Respiratory: Effort normal and breath sounds normal. No respiratory distress. She has no wheezes.  GI: Soft. Bowel sounds are normal. She exhibits no distension. There is no tenderness.  Musculoskeletal: She exhibits edema.  Left thigh appropriately tender.   Neurological: She is alert and oriented to person, place, and time. She displays normal reflexes. No cranial nerve deficit. She exhibits normal muscle tone. Coordination abnormal.  UE 5/5. LLE limited due to pain/ortho.Marland Kitchen RLE 3+ prox to 4- distally. No sensory changes.   Skin: Skin is warm and dry.  Wounds dressed on left knee/thigh. Minimal drainage.  Psychiatric: She has a normal mood and affect. Her behavior is normal. Judgment and thought content normal.    Results for orders placed during the hospital encounter of 10/02/13 (from the past 24 hour(s))  GLUCOSE, CAPILLARY     Status: Abnormal   Collection Time    10/08/13 11:52 AM      Result Value Range   Glucose-Capillary 274 (*) 70 - 99 mg/dL   Comment 1 Notify RN    GLUCOSE, CAPILLARY     Status: Abnormal   Collection Time    10/08/13  4:55 PM  Result Value Range   Glucose-Capillary 239 (*) 70 - 99 mg/dL   Comment 1 Notify RN    GLUCOSE, CAPILLARY     Status: Abnormal   Collection Time    10/08/13  9:37 PM      Result Value Range   Glucose-Capillary 165 (*) 70 - 99 mg/dL   Comment 1 Notify RN    GLUCOSE, CAPILLARY     Status: Abnormal   Collection Time    10/09/13  8:04 AM      Result Value Range   Glucose-Capillary 156 (*) 70 - 99 mg/dL  CBC     Status: Abnormal   Collection Time    10/09/13  8:05 AM      Result Value Range   WBC 12.6 (*) 4.0 - 10.5 K/uL   RBC 3.73 (*)  3.87 - 5.11 MIL/uL   Hemoglobin 11.0 (*) 12.0 - 15.0 g/dL   HCT 08.6 (*) 57.8 - 46.9 %   MCV 92.2  78.0 - 100.0 fL   MCH 29.5  26.0 - 34.0 pg   MCHC 32.0  30.0 - 36.0 g/dL   RDW 62.9  52.8 - 41.3 %   Platelets 295  150 - 400 K/uL   No results found.  Assessment/Plan: Diagnosis: left femoral shaft fx after fall 1. Does the need for close, 24 hr/day medical supervision in concert with the patient's rehab needs make it unreasonable for this patient to be served in a less intensive setting? Yes 2. Co-Morbidities requiring supervision/potential complications: dm, morbid obesity 3. Due to bladder management, bowel management, safety, skin/wound care, disease management, medication administration, pain management and patient education, does the patient require 24 hr/day rehab nursing? Yes 4. Does the patient require coordinated care of a physician, rehab nurse, PT (1-2 hrs/day, 5 days/week) and OT (1-2 hrs/day, 5 days/week) to address physical and functional deficits in the context of the above medical diagnosis(es)? Yes Addressing deficits in the following areas: balance, endurance, locomotion, strength, transferring, bowel/bladder control, bathing, dressing, feeding, grooming, toileting and psychosocial support 5. Can the patient actively participate in an intensive therapy program of at least 3 hrs of therapy per day at least 5 days per week? Yes 6. The potential for patient to make measurable gains while on inpatient rehab is excellent 7. Anticipated functional outcomes upon discharge from inpatient rehab are mod I to supervision with PT, mod I to supervision with OT, n/a with SLP. 8. Estimated rehab length of stay to reach the above functional goals is: 8-12 days 9. Does the patient have adequate social supports to accommodate these discharge functional goals? Yes 10. Anticipated D/C setting: Home 11. Anticipated post D/C treatments: HH therapy 12. Overall Rehab/Functional Prognosis:  excellent  RECOMMENDATIONS: This patient's condition is appropriate for continued rehabilitative care in the following setting: CIR Patient has agreed to participate in recommended program. Yes Note that insurance prior authorization may be required for reimbursement for recommended care.  Comment: Rehab RN to follow up.   Ranelle Oyster, MD, [place] [facility]     10/09/2013

## 2013-10-09 NOTE — H&P (Signed)
Physical Medicine and Rehabilitation Admission H&P  Chief Complaint   Patient presents with   .  Fall with left femur fracture.  Left leg pain and weakness  :  HPI: Alexandra Kelley is a 32 y.o. female with history of DM, thyroid disease, morbid obesity, who was admitted on 10/03/13 past fall and subsequent closed displaced oblique fracture of shaft of left femur. Mechanical fall that occurred due to patient tripping when she got up from dinner table. Patient with hypoxia and CT chest negative for PE. She was evaluated by Dr. Ophelia Charter and underwent IM nailing for repair on the same day. Post op PWB and foley placed for urinary retention. PT evaluation done and patient limited by pain. Also continues to be Hypoxic requiring 3 L oxygen with activity. Leucocytosis resolving. Therapy team recommending CIR and patient admitted today.    Review of Systems  HENT: Negative for hearing loss.  Eyes: Negative for blurred vision and double vision.  Respiratory: Negative for cough and shortness of breath.  Congested  Cardiovascular: Negative for chest pain and palpitations.  Gastrointestinal: Negative for nausea, vomiting and abdominal pain.  Genitourinary: Negative for dysuria and frequency.  Foley out this am--voided X 1  Musculoskeletal: Positive for joint pain (LLE pain "10" with activity) and myalgias (muscle spasms BLE).  Neurological: Negative for headaches.  Psychiatric/Behavioral: The patient is not nervous/anxious and does not have insomnia.   Past Medical History   Diagnosis  Date   .  Diabetes mellitus without complication    .  Thyroid disease     Past Surgical History   Procedure  Laterality  Date   .  Tubes in ears     .  Back surgery     .  Femur im nail  Left  10/03/2013     Procedure: INTRAMEDULLARY (IM) RETROGRADE FEMORAL NAILING; Surgeon: Eldred Manges, MD; Location: [facility] OR; Service: Orthopedics; Laterality: Left;    Family History   Problem  Relation  Age of Onset   .   Hypertension  Mother    .  Thyroid disease  Mother     Social History: Lives with family. Independent without AD. Needed some assistance for bathing. She reports that she has never smoked. She does not have any smokeless tobacco history on file. She reports that she does not drink alcohol or use illicit drugs.  Allergies   Allergen  Reactions   .  Ceclor [Cefaclor]      Rash   .  Penicillins  Rash    Medications Prior to Admission   Medication  Sig  Dispense  Refill   .  fluticasone (FLONASE) 50 MCG/ACT nasal spray  Place 2 sprays into the nose daily.     Marland Kitchen  glimepiride (AMARYL) 4 MG tablet  Take 4 mg by mouth 2 (two) times daily.     Marland Kitchen  levothyroxine (SYNTHROID, LEVOTHROID) 137 MCG tablet  Take 137 mcg by mouth daily.     .  [DISCONTINUED] ranitidine (ZANTAC) 150 MG tablet  Take 150 mg by mouth 2 (two) times daily as needed for heartburn.      Home:  Home Living  Family/patient expects to be discharged to:: Private residence  Living Arrangements: Parent  Available Help at Discharge: Family  Home Access: Stairs to enter  Secretary/administrator of Steps: 2  Home Layout: One level  Home Equipment: None  Functional History:   Functional Status:  Mobility:  Bed Mobility  Bed Mobility: Scooting to HOB;Sit  to Supine  Supine to Sit: HOB elevated;1: +2 Total assist  Supine to Sit: Patient Percentage: 70%  Sitting - Scoot to Edge of Bed: 1: +2 Total assist  Sitting - Scoot to Edge of Bed: Patient Percentage: 70%  Sit to Supine: 1: +2 Total assist  Sit to Supine: Patient Percentage: 50%  Scooting to HOB: 5: Supervision ([place] position)  Scooting to [facility]: Patient Percentage: 60%  Transfers  Transfers: Sit to Stand;Stand to Sit  Sit to Stand: 1: +2 Total assist;With upper extremity assist;From chair/3-in-1  Sit to Stand: Patient Percentage: 60% (60% from recliner chair and 70% from [facility])  Stand to Sit: 4: Min guard;To chair/3-in-1;To bed  Stand to Sit: Patient Percentage: 80%   Stand Pivot Transfers: 1: +2 Total assist  Stand Pivot Transfers: Patient Percentage: 80%  Ambulation/Gait  Ambulation/Gait Assistance: 1: +2 Total assist  Ambulation/Gait: Patient Percentage: 60% (progressing to 70%)  Ambulation Distance (Feet): 8 Feet  Assistive device: Rolling walker  Ambulation/Gait Assistance Details: Verbal, Tactile and Demo cues for gait sequence and technique; Heavily cues to push down into RW to step RLE to unweigh LLE in stance to 50%; Difficult to ascertain if pt able to [facility] to 50%, but she did report her arms were tired, which means she did unweigh LLE at least some in stance  Gait Pattern: Step-to pattern   ADL:  ADL  Grooming: Wash/dry face;Teeth care;Minimal assistance (Min A for balance while standing)  Where Assessed - Grooming: Supported standing;Supported sitting (washed face-sitting and teeth-standing)  Upper Body Dressing: Set up;Supervision/safety  Where Assessed - Upper Body Dressing: Unsupported sitting  Lower Body Dressing: Moderate assistance  Where Assessed - Lower Body Dressing: Supported sit to Scientist, research (life sciences): +2 Total assistance;Min guard (Min guard-stand to sit)  Statistician Method: Sit to Production manager: Bedside commode  Equipment Used: Gait belt;Reacher;Rolling walker;Sock aid  Transfers/Ambulation Related to ADLs: +2 assist for ambulation for safety-cues for technique. Min guard for stand to sit transfers and +2 Total A (60%) from recliner and 70% from [facility].  ADL Comments: Pt practiced with AE for LB ADLs- donned underwear and donned/doffed socks. Pt performed grooming at sink.  Cognition:  Cognition  Overall Cognitive Status: Within Functional Limits for tasks assessed  Orientation Level: Oriented to person;Oriented to place;Disoriented to time  Cognition  Arousal/Alertness: Awake/alert  Behavior During Therapy: WFL for tasks assessed/performed  Overall Cognitive Status: Within Functional Limits for  tasks assessed  Memory: (Required repetition of gait sequence)    Physical Exam:  Blood pressure 100/54, pulse 103, temperature 97.9 F (36.6 C), temperature source Oral, resp. rate 20, height 5\' 3"  (1.6 m), weight 149.9 kg (330 lb 7.5 oz), SpO2 98.00%.  Constitutional: She is oriented to person, place, and time. She appears well-developed and well-nourished.  Morbidly obese female--congested sounding with erythematous nose.  HENT:  Head: Normocephalic and atraumatic.  Eyes: Conjunctivae are normal. Pupils are equal, round, and reactive to light.  Neck: Normal range of motion. Neck supple.  Cardiovascular: Normal rate and regular rhythm.  Respiratory: Effort normal and breath sounds normal. No respiratory distress. She has no wheezes.  GI: Soft. Bowel sounds are normal. She exhibits distension. There is no tenderness.  Musculoskeletal:  2+ edema LLE with small sutured incisions on thigh. 1+ edema RLE. Excoriated areas on forearms due to bug bites.  Neurological: She is alert and oriented to person, place, and time.  UE 5/5. LLE limited due to pain/ortho---1+ HF, KE and 3+  distally.Marland Kitchen RLE 3+ prox to 4 distally. No sensory changes. dt'rs 1+. Skin: Skin is warm and dry other Psychiatric: She has a normal mood and affect. Her speech is normal and behavior is normal. Thought content normal. Cognition and memory are normal.   Results for orders placed during the hospital encounter of 10/02/13 (from the past 48 hour(s))   GLUCOSE, CAPILLARY Status: Abnormal    Collection Time    10/07/13 4:47 PM   Result  Value  Range    Glucose-Capillary  262 (*)  70 - 99 mg/dL    Comment 1  Notify RN    GLUCOSE, CAPILLARY Status: Abnormal    Collection Time    10/07/13 9:12 PM   Result  Value  Range    Glucose-Capillary  152 (*)  70 - 99 mg/dL   CBC Status: Abnormal    Collection Time    10/08/13 4:50 AM   Result  Value  Range    WBC  14.5 (*)  4.0 - 10.5 K/uL    RBC  3.67 (*)  3.87 - 5.11 MIL/uL     Hemoglobin  10.8 (*)  12.0 - 15.0 g/dL    HCT  96.0 (*)  45.4 - 46.0 %    MCV  93.2  78.0 - 100.0 fL    MCH  29.4  26.0 - 34.0 pg    MCHC  31.6  30.0 - 36.0 g/dL    RDW  09.8  11.9 - 14.7 %    Platelets  264  150 - 400 K/uL   BASIC METABOLIC PANEL Status: Abnormal    Collection Time    10/08/13 4:50 AM   Result  Value  Range    Sodium  138  135 - 145 mEq/L    Potassium  3.9  3.5 - 5.1 mEq/L    Chloride  93 (*)  96 - 112 mEq/L    CO2  36 (*)  19 - 32 mEq/L    Glucose, Bld  208 (*)  70 - 99 mg/dL    BUN  11  6 - 23 mg/dL    Creatinine, Ser  8.29  0.50 - 1.10 mg/dL    Calcium  9.2  8.4 - 10.5 mg/dL    GFR calc non Af Amer  >90  >90 mL/min    GFR calc Af Amer  >90  >90 mL/min    Comment:  (NOTE)     The eGFR has been calculated using the CKD EPI equation.     This calculation has not been validated in all clinical situations.     eGFR's persistently <90 mL/min signify possible Chronic Kidney     Disease.   GLUCOSE, CAPILLARY Status: Abnormal    Collection Time    10/08/13 8:01 AM   Result  Value  Range    Glucose-Capillary  172 (*)  70 - 99 mg/dL    Comment 1  Notify RN    GLUCOSE, CAPILLARY Status: Abnormal    Collection Time    10/08/13 11:52 AM   Result  Value  Range    Glucose-Capillary  274 (*)  70 - 99 mg/dL    Comment 1  Notify RN    GLUCOSE, CAPILLARY Status: Abnormal    Collection Time    10/08/13 4:55 PM   Result  Value  Range    Glucose-Capillary  239 (*)  70 - 99 mg/dL    Comment 1  Notify RN    GLUCOSE,  CAPILLARY Status: Abnormal    Collection Time    10/08/13 9:37 PM   Result  Value  Range    Glucose-Capillary  165 (*)  70 - 99 mg/dL    Comment 1  Notify RN    GLUCOSE, CAPILLARY Status: Abnormal    Collection Time    10/09/13 8:04 AM   Result  Value  Range    Glucose-Capillary  156 (*)  70 - 99 mg/dL   CBC Status: Abnormal    Collection Time    10/09/13 8:05 AM   Result  Value  Range    WBC  12.6 (*)  4.0 - 10.5 K/uL    RBC  3.73 (*)  3.87 -  5.11 MIL/uL    Hemoglobin  11.0 (*)  12.0 - 15.0 g/dL    HCT  16.1 (*)  09.6 - 46.0 %    MCV  92.2  78.0 - 100.0 fL    MCH  29.5  26.0 - 34.0 pg    MCHC  32.0  30.0 - 36.0 g/dL    RDW  04.5  40.9 - 81.1 %    Platelets  295  150 - 400 K/uL   GLUCOSE, CAPILLARY Status: Abnormal    Collection Time    10/09/13 12:07 PM   Result  Value  Range    Glucose-Capillary  206 (*)  70 - 99 mg/dL    No results found.  Post Admission Physician Evaluation:  1. Functional deficits secondary to left femoral shaft fx after fall. 2. Patient is admitted to receive collaborative, interdisciplinary care between the physiatrist, rehab nursing staff, and therapy team. 3. Patient's level of medical complexity and substantial therapy needs in context of that medical necessity cannot be provided at a lesser intensity of care such as a SNF. 4. Patient has experienced substantial functional loss from his/her baseline which was documented above under the "Functional History" and "Functional Status" headings. Judging by the patient's diagnosis, physical exam, and functional history, the patient has potential for functional progress which will result in measurable gains while on inpatient rehab. These gains will be of substantial and practical use upon discharge in facilitating mobility and self-care at the household level. 5. Physiatrist will provide 24 hour management of medical needs as well as oversight of the therapy plan/treatment and provide guidance as appropriate regarding the interaction of the two. 6. 24 hour rehab nursing will assist with bladder management, bowel management, safety, skin/wound care, disease management, medication administration, pain management and patient education and help integrate therapy concepts, techniques,education, etc. 7. PT will assess and treat for/with: Lower extremity strength, range of motion, stamina, balance, functional mobility, safety, adaptive techniques and equipment, pain  mgt,ortho precautions. Goals are: supervision to mod I. 8. OT will assess and treat for/with: ADL's, functional mobility, safety, upper extremity strength, adaptive techniques and equipment, pain mgt, ortho precautions. Goals are: mod I to min assist. 9. SLP will assess and treat for/with: n/a. Goals are: n/a. 10. Case Management and Social Worker will assess and treat for psychological issues and discharge planning. 11. Team conference will be held weekly to assess progress toward goals and to determine barriers to discharge. 12. Patient will receive at least 3 hours of therapy per day at least 5 days per week. 13. ELOS: 10-12 days  14. Prognosis: excellent Medical Problem List and Plan:  1. DVT Prophylaxis/Anticoagulation: Pharmaceutical: Lovenox  2. Pain Management: prn medications effective.  3. 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 Will have  LCSW follow for evaluation.  4. Neuropsych: This patient is capable of making decisions on her own behalf.  5. DM type 2: Will monitor BS with ac/hs checks. Resume Amaryl 4 mg bid--anticipate BS to run high with stress. Will use SSI and meal coverage to keep BS controlled. Wean off levemir as Hgb A1c (7.6) --indicates reasonable control  6. Hypothyroid: Resume supplement.  7. Leucocytosis: Likely reactive. Will recheck in am. Will check UA/UCS as foley has been in place for retention.  8. Urinary retention: Will discontinue foley and start voiding trial. Get patient to [facility] to void.  9. Hypoxia: Encourage IS--limit supplemental oxygen as Hypercarbia noted.  10 Tachycardia: likely due to deconditioning. Monitor heart rate with bid checks. Monitor for symptoms with increase in activity.   Ranelle Oyster, MD, [facility] Physical Medicine & Rehabilitation   10/09/2013

## 2013-10-09 NOTE — Clinical Documentation Improvement (Signed)
Possible Clinical Conditions?   _______Diabetes Type   or 2 _______Controlled or uncontrolled  Manifestations:  _______DM retinopathy  _______DM PVD _______DM neuropathy   _______DM nephropathy  Associated conditions: _______DM cellulitis _______DM gangrene _______DM gastroparesis _______DM hyperosmolarity state _______DM ketoacidosis with or without coma _______DM osteomyelitis _______DM skin ulcer  _______Other Condition _______Cannot Clinically determine    Risk Factors: Hyperglycemia in the setting of diabetes mellitus noted per 12/07 progress notes.  Diagnostics: glucose, capillary: 12/04:  482 12/06:  265 12/07:  274  Thank You, Marciano Sequin, Clinical Documentation Specialist:  [phone]  [facility]- Health Information Management

## 2013-10-09 NOTE — Progress Notes (Signed)
Rehab admissions - Evaluated for possible admission.  I met with patient's dad.  He would like inpatient rehab admission.  Bed available and will plan to admit to inpatient rehab today.  Call me for questions.  #[phone]

## 2013-10-09 NOTE — Progress Notes (Signed)
Inpatient Diabetes Program Recommendations  AACE/ADA: New Consensus Statement on Inpatient Glycemic Control (2013)  Target Ranges:  Prepandial:   less than 140 mg/dL      Peak postprandial:   less than 180 mg/dL (1-2 hours)      Critically ill patients:  140 - 180 mg/dL     Results for TEQULIA, GONSALVES (MRN [id number]) as of 10/09/2013 14:43  Ref. Range 10/08/2013 08:01 10/08/2013 11:52 10/08/2013 16:55 10/08/2013 21:37  Glucose-Capillary Latest Range: 70-99 mg/dL 621 (H) 308 (H) 657 (H) 165 (H)    Results for USHA, SLAGER (MRN [id number]) as of 10/09/2013 14:43  Ref. Range 10/09/2013 08:04 10/09/2013 12:07  Glucose-Capillary Latest Range: 70-99 mg/dL 841 (H) 324 (H)     **Patient having elevated postprandial CBGs.   **MD- Please consider increasing Novolog meal coverage to Novolog 6 units tid with meals (currently ordered as Novolog 4 units tid with meals)   Will follow. Ambrose Finland RN, MSN, CDE Diabetes Coordinator Inpatient Diabetes Program Team Pager: [phone] (8a-10p)

## 2013-10-09 NOTE — Progress Notes (Signed)
Subjective: 6 Days Post-Op Procedure(s) (LRB): INTRAMEDULLARY (IM) RETROGRADE FEMORAL NAILING (Left) Patient reports pain as mild.  Little pain in leg but pts father reports trouble with muscle cramps of both legs. Muscle relaxers works some.  Objective: Vital signs in last 24 hours: Temp:  [97.9 F (36.6 C)-98.3 F (36.8 C)] 97.9 F (36.6 C) (12/08 0500) Pulse Rate:  [103-113] 103 (12/08 0500) Resp:  [16-20] 20 (12/08 0500) BP: (100-115)/(42-59) 100/54 mmHg (12/08 0500) SpO2:  [96 %-100 %] 98 % (12/08 0500)  Intake/Output from previous day: 12/07 0701 - 12/08 0700 In: 480 [P.O.:480] Out: -  Intake/Output this shift:     Recent Labs  10/08/13 0450 10/09/13 0805  HGB 10.8* 11.0*    Recent Labs  10/08/13 0450 10/09/13 0805  WBC 14.5* 12.6*  RBC 3.67* 3.73*  HCT 34.2* 34.4*  PLT 264 295    Recent Labs  10/07/13 0335 10/08/13 0450  NA 137 138  K 4.6 3.9  CL 95* 93*  CO2 33* 36*  BUN 13 11  CREATININE 0.79 0.69  GLUCOSE 277* 208*  CALCIUM 9.3 9.2   No results found for this basename: LABPT, INR,  in the last 72 hours  Neurovascular intact Sensation intact distally Dorsiflexion/Plantar flexion intact Incision: no drainage No cellulitis present Compartment soft Mild edema at ankles bilaterally. Assessment/Plan: 6 Days Post-Op Procedure(s) (LRB): INTRAMEDULLARY (IM) RETROGRADE FEMORAL NAILING (Left) Up with therapy.  Doing better with activity.   Encouraged ankle pumps.  Will fit with knee hi TEDS to prevent further swelling and VTE prophylaxis. May DC if pt doesn't tolerate them.  Explained to pt and her father. Agree with CIR   Tallin Hart M 10/09/2013, 10:34 AM

## 2013-10-10 ENCOUNTER — Inpatient Hospital Stay (HOSPITAL_COMMUNITY): Payer: Medicaid Other

## 2013-10-10 ENCOUNTER — Inpatient Hospital Stay (HOSPITAL_COMMUNITY): Payer: Medicaid Other | Admitting: Physical Therapy

## 2013-10-10 DIAGNOSIS — W19XXXA Unspecified fall, initial encounter: Secondary | ICD-10-CM

## 2013-10-10 DIAGNOSIS — D62 Acute posthemorrhagic anemia: Secondary | ICD-10-CM

## 2013-10-10 DIAGNOSIS — S7290XA Unspecified fracture of unspecified femur, initial encounter for closed fracture: Secondary | ICD-10-CM

## 2013-10-10 DIAGNOSIS — E669 Obesity, unspecified: Secondary | ICD-10-CM

## 2013-10-10 LAB — CBC WITH DIFFERENTIAL/PLATELET
Basophils Relative: 1 % (ref 0–1)
Eosinophils Relative: 3 % (ref 0–5)
HCT: 34.7 % — ABNORMAL LOW (ref 36.0–46.0)
Hemoglobin: 11.2 g/dL — ABNORMAL LOW (ref 12.0–15.0)
Lymphocytes Relative: 23 % (ref 12–46)
MCH: 29.4 pg (ref 26.0–34.0)
MCHC: 32.3 g/dL (ref 30.0–36.0)
MCV: 91.1 fL (ref 78.0–100.0)
Monocytes Absolute: 0.7 10*3/uL (ref 0.1–1.0)
Monocytes Relative: 5 % (ref 3–12)
Neutro Abs: 9.4 10*3/uL — ABNORMAL HIGH (ref 1.7–7.7)
RBC: 3.81 MIL/uL — ABNORMAL LOW (ref 3.87–5.11)

## 2013-10-10 LAB — GLUCOSE, CAPILLARY
Glucose-Capillary: 133 mg/dL — ABNORMAL HIGH (ref 70–99)
Glucose-Capillary: 151 mg/dL — ABNORMAL HIGH (ref 70–99)

## 2013-10-10 LAB — COMPREHENSIVE METABOLIC PANEL
AST: 54 U/L — ABNORMAL HIGH (ref 0–37)
Albumin: 2.9 g/dL — ABNORMAL LOW (ref 3.5–5.2)
BUN: 9 mg/dL (ref 6–23)
CO2: 34 mEq/L — ABNORMAL HIGH (ref 19–32)
Chloride: 94 mEq/L — ABNORMAL LOW (ref 96–112)
Creatinine, Ser: 0.61 mg/dL (ref 0.50–1.10)
GFR calc non Af Amer: >90 mL/min (ref >90)
Total Bilirubin: 0.5 mg/dL (ref 0.3–1.2)

## 2013-10-10 MED ORDER — MORPHINE SULFATE ER 15 MG PO TBCR
15.0000 mg | EXTENDED_RELEASE_TABLET | Freq: Every day | ORAL | Status: DC
  Administered 2013-10-10: 15 mg via ORAL
  Filled 2013-10-10: qty 1

## 2013-10-10 MED ORDER — INSULIN DETEMIR 100 UNIT/ML ~~LOC~~ SOLN
15.0000 [IU] | Freq: Every day | SUBCUTANEOUS | Status: AC
  Administered 2013-10-10 – 2013-10-12 (×3): 15 [IU] via SUBCUTANEOUS
  Filled 2013-10-10 (×4): qty 0.15

## 2013-10-10 NOTE — Progress Notes (Signed)
Social Work Patient ID: Alexandra Kelley, female   DOB: Aug 18, 1981, 32 y.o.   MRN: [id number] Met with pt and Mom who report today went well.  Now pt knows what to expect here on rehab.  She feels ]pretty good about her therapies. Mom and Dad plan to be here more and have taken time off from work to be here.  See tomorrow to provide support and follow along.

## 2013-10-10 NOTE — Progress Notes (Addendum)
Subjective/Complaints: Pain kept her awake last noc Drifted off to sleep after pain med then up again Review of Systems - Negative except Left thigh pain Objective: Vital Signs: Blood pressure 113/58, pulse 100, temperature 98.8 F (37.1 C), temperature source Oral, resp. rate 19, height 5\' 3"  (1.6 m), weight 150.141 kg (331 lb), SpO2 93.00%. No results found. Results for orders placed during the hospital encounter of 10/09/13 (from the past 72 hour(s))  GLUCOSE, CAPILLARY     Status: Abnormal   Collection Time    10/09/13  4:52 PM      Result Value Range   Glucose-Capillary 224 (*) 70 - 99 mg/dL   Comment 1 Notify RN    URINALYSIS, ROUTINE W REFLEX MICROSCOPIC     Status: Abnormal   Collection Time    10/09/13  6:40 PM      Result Value Range   Color, Urine YELLOW  YELLOW   APPearance CLEAR  CLEAR   Specific Gravity, Urine 1.012  1.005 - 1.030   pH 6.5  5.0 - 8.0   Glucose, UA NEGATIVE  NEGATIVE mg/dL   Hgb urine dipstick NEGATIVE  NEGATIVE   Bilirubin Urine NEGATIVE  NEGATIVE   Ketones, ur NEGATIVE  NEGATIVE mg/dL   Protein, ur NEGATIVE  NEGATIVE mg/dL   Urobilinogen, UA 0.2  0.0 - 1.0 mg/dL   Nitrite NEGATIVE  NEGATIVE   Leukocytes, UA TRACE (*) NEGATIVE  URINE MICROSCOPIC-ADD ON     Status: Abnormal   Collection Time    10/09/13  6:40 PM      Result Value Range   Squamous Epithelial / LPF RARE  RARE   WBC, UA 3-6  <3 WBC/hpf   Bacteria, UA FEW (*) RARE  GLUCOSE, CAPILLARY     Status: Abnormal   Collection Time    10/09/13  8:59 PM      Result Value Range   Glucose-Capillary 296 (*) 70 - 99 mg/dL   Comment 1 Notify RN    CBC WITH DIFFERENTIAL     Status: Abnormal   Collection Time    10/10/13  5:50 AM      Result Value Range   WBC 13.9 (*) 4.0 - 10.5 K/uL   RBC 3.81 (*) 3.87 - 5.11 MIL/uL   Hemoglobin 11.2 (*) 12.0 - 15.0 g/dL   HCT 16.1 (*) 09.6 - 04.5 %   MCV 91.1  78.0 - 100.0 fL   MCH 29.4  26.0 - 34.0 pg   MCHC 32.3  30.0 - 36.0 g/dL   RDW 40.9   81.1 - 91.4 %   Platelets 316  150 - 400 K/uL   Neutrophils Relative % 68  43 - 77 %   Neutro Abs 9.4 (*) 1.7 - 7.7 K/uL   Lymphocytes Relative 23  12 - 46 %   Lymphs Abs 3.2  0.7 - 4.0 K/uL   Monocytes Relative 5  3 - 12 %   Monocytes Absolute 0.7  0.1 - 1.0 K/uL   Eosinophils Relative 3  0 - 5 %   Eosinophils Absolute 0.5  0.0 - 0.7 K/uL   Basophils Relative 1  0 - 1 %   Basophils Absolute 0.1  0.0 - 0.1 K/uL  COMPREHENSIVE METABOLIC PANEL     Status: Abnormal   Collection Time    10/10/13  5:50 AM      Result Value Range   Sodium 138  135 - 145 mEq/L   Potassium 3.5  3.5 -  5.1 mEq/L   Chloride 94 (*) 96 - 112 mEq/L   CO2 34 (*) 19 - 32 mEq/L   Glucose, Bld 110 (*) 70 - 99 mg/dL   BUN 9  6 - 23 mg/dL   Creatinine, Ser 8.29  0.50 - 1.10 mg/dL   Calcium 9.4  8.4 - 56.2 mg/dL   Total Protein 6.8  6.0 - 8.3 g/dL   Albumin 2.9 (*) 3.5 - 5.2 g/dL   AST 54 (*) 0 - 37 U/L   ALT 53 (*) 0 - 35 U/L   Alkaline Phosphatase 57  39 - 117 U/L   Total Bilirubin 0.5  0.3 - 1.2 mg/dL   GFR calc non Af Amer >90  >90 mL/min   GFR calc Af Amer >90  >90 mL/min   Comment: (NOTE)     The eGFR has been calculated using the CKD EPI equation.     This calculation has not been validated in all clinical situations.     eGFR's persistently <90 mL/min signify possible Chronic Kidney     Disease.  GLUCOSE, CAPILLARY     Status: Abnormal   Collection Time    10/10/13  7:35 AM      Result Value Range   Glucose-Capillary 116 (*) 70 - 99 mg/dL   Comment 1 Notify RN       HEENT: normal Cardio: RRR and no murmur Resp: CTA B/L and unlabored GI: BS positive and NT Extremity:  Pulses positive and No Edema Skin:   Intact and Wound C/D/I and groin, distal thigh Neuro: Alert/Oriented, Normal Sensory and Abnormal Motor 3-/5 L KE, 2- L HF, 3/5 L ankle DF/PF, 5/5 in BUE and RLE Musc/Skel:  Swelling Left thigh Gen NAD   Assessment/Plan: 1. Functional deficits secondary to Left femoral shaft fx which  require 3+ hours per day of interdisciplinary therapy in a comprehensive inpatient rehab setting. Physiatrist is providing close team supervision and 24 hour management of active medical problems listed below. Physiatrist and rehab team continue to assess barriers to discharge/monitor patient progress toward functional and medical goals. FIM:                   Comprehension Comprehension Mode: Auditory Comprehension: 4-Understands basic 75 - 89% of the time/requires cueing 10 - 24% of the time  Expression Expression Mode: Verbal Expression: 5-Expresses basic needs/ideas: With no assist     Problem Solving Problem Solving: 3-Solves basic 50 - 74% of the time/requires cueing 25 - 49% of the time  Memory Memory: 5-Recognizes or recalls 90% of the time/requires cueing < 10% of the time  Medical Problem List and Plan:  1. DVT Prophylaxis/Anticoagulation: Pharmaceutical: Lovenox  2. Pain Management: prn medications ineffective at noc add long acting  3. 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 Will have LCSW follow for evaluation.  4. Neuropsych: This patient is capable of making decisions on her own behalf.  5. DM type 2: Will monitor BS with ac/hs checks. Resume Amaryl 4 mg bid--anticipate BS to run high with stress. Will use SSI and meal coverage to keep BS controlled. Wean off levemir as Hgb A1c (7.6) --indicates reasonable control  6. Hypothyroid: Resume supplement.  7. Leucocytosis: Likely reactive. Will recheck in am. Will check UA/UCS as foley has been in place for retention.  8. Urinary retention: Will discontinue foley and start voiding trial. [facility] to [facility] to void.  9. Hypoxia: Encourage IS--limit supplemental oxygen as Hypercarbia noted.  10 Tachycardia: likely due  to deconditioning. Monitor heart rate with bid checks. Monitor for symptoms with increase in activity.    LOS (Days) 1 A FACE TO FACE EVALUATION WAS PERFORMED  Alexandra Kelley 10/10/2013,  8:00 AM

## 2013-10-10 NOTE — Progress Notes (Signed)
Nursing Note: D: Pt requested more pain med.Pt states that ultram did not help at all. A: Robaxin 500 mg po given.wbb

## 2013-10-10 NOTE — Care Management Note (Signed)
Inpatient Rehabilitation Center Individual Statement of Services  Patient Name:  Alexandra Kelley  Date:  10/10/2013  Welcome to the Inpatient Rehabilitation Center.  Our goal is to provide you with an individualized program based on your diagnosis and situation, designed to meet your specific needs.  With this comprehensive rehabilitation program, you will be expected to participate in at least 3 hours of rehabilitation therapies Monday-Friday, with modified therapy programming on the weekends.  Your rehabilitation program will include the following services:  Physical Therapy (PT), Occupational Therapy (OT), 24 hour per day rehabilitation nursing, Therapeutic Recreaction (TR), Case Management (Social Worker), Rehabilitation Medicine, Nutrition Services and Pharmacy Services  Weekly team conferences will be held on Wednesday to discuss your progress.  Your Social Worker will talk with you frequently to get your input and to update you on team discussions.  Team conferences with you and your family in attendance may also be held.  Expected length of stay: 8-10 days Overall anticipated outcome: supervision/min level  Depending on your progress and recovery, your program may change. Your Social Worker will coordinate services and will keep you informed of any changes. Your Social Worker's name and contact numbers are listed  below.  The following services may also be recommended but are not provided by the Inpatient Rehabilitation Center:    Home Health Rehabiltiation Services  Outpatient Rehabilitation Services    Arrangements will be made to provide these services after discharge if needed.  Arrangements include referral to agencies that provide these services.  Your insurance has been verified to be:  Medicaid [employer] primary doctor is:  DR Assunta Found  Pertinent information will be shared with your doctor and your insurance company.  Social Worker:  Dossie Der, SW  [phone] or [phone]  Information discussed with and copy given to patient by: Lucy Chris, 10/10/2013, 11:26 AM

## 2013-10-10 NOTE — Progress Notes (Signed)
Inpatient Diabetes Program Recommendations  AACE/ADA: New Consensus Statement on Inpatient Glycemic Control (2013)  Target Ranges:  Prepandial:   less than 140 mg/dL      Peak postprandial:   less than 180 mg/dL (1-2 hours)      Critically ill patients:  140 - 180 mg/dL   Pt ordered both oral sulfonyurea to cover meals as well as novolog meal coverage.  Inpatient Diabetes Program Recommendations Insulin - Basal: xxx Oral Agents: Please do not use the Amaryl wihile in the hospital. Amaryl durationof action lasts all day and po intake can vary.  Pt has novolog meal coverage to cove meals. HgbA1C: xxx  Thank you, Lenor Coffin, RN, CNS, Diabetes Coordinator [phone])

## 2013-10-10 NOTE — Progress Notes (Signed)
Social Work Assessment and Plan Social Work Assessment and Plan  Patient Details  Name: Alexandra Kelley MRN: [id number] Date of Birth: 08/21/1981  Today's Date: 10/10/2013  Problem List:  Patient Active Problem List   Diagnosis Date Noted  . Femur fracture, left 10/09/2013  . Acute urinary retention 10/04/2013  . Acute-on-chronic respiratory failure 10/04/2013  . Femur fracture 10/03/2013  . Closed displaced oblique fracture of shaft of left femur 10/03/2013  . Hypothyroidism 10/03/2013  . Diabetes 10/03/2013   Past Medical History:  Past Medical History  Diagnosis Date  . Diabetes mellitus without complication     Dr. Lurene Shadow  . Thyroid disease   . GERD (gastroesophageal reflux disease)    Past Surgical History:  Past Surgical History  Procedure Laterality Date  . Tubes in ears    . Back surgery    . Femur im nail Left 10/03/2013    Procedure: INTRAMEDULLARY (IM) RETROGRADE FEMORAL NAILING;  Surgeon: Eldred Manges, MD;  Location: [facility] OR;  Service: Orthopedics;  Laterality: Left;   Social History:  reports that she has never smoked. She has never used smokeless tobacco. She reports that she does not drink alcohol or use illicit drugs.  Family / Support Systems Marital Status: Single Patient Roles: Other (Comment) (Child) Other Supports: Tim-father  [phone]  [phone]   Tammy-mom  [phone] Anticipated Caregiver: Parents and neighbors Ability/Limitations of Caregiver: Mom works 8-6pm and Dad works-8;30-2;00pm will have neighbors stay with until Dad returns home Caregiver Availability: 24/7 Family Dynamics: Close knit family hwo rely upon one another.  Itis just the three of them and they are very close.  Pt relies upon them and does what she can for herself and was able to stay alone while Dad was gone for 3-4 hours until this.  Social History Preferred language: English Religion: Baptist Cultural Background: No issues Education: [employer]  Read:  Yes Write: Yes Employment Status: Disabled Fish farm manager Issues: No issues Guardian/Conservator: Parents are her legal guardians-will work with them regaridng any decisions that need to be made.  MD feels pt is capable of making her own decisions also   Abuse/Neglect Physical Abuse: Denies Verbal Abuse: Denies Sexual Abuse: Denies Exploitation of patient/patient's resources: Denies Self-Neglect: Denies  Emotional Status Pt's affect, behavior adn adjustment status: Pt reports she did not sleep well last night and is very tired but willing to try in therapies.  She is willing to to do her best and wants to get home as soon as possible. Her Dad is very supportive and involved.  Pt seems to be high functioning but looks toward her Dad for answers when questions asked. Recent Psychosocial Issues: Other health issues, was managing well Pyschiatric History: No history Dad and pt feel she is doing ok with all of this and this will be short term.  Deferred depression screen at this time.  Will monitor her coping and work with team if concerns arise.   Substance Abuse History: No issues  Patient / Family Perceptions, Expectations & Goals Pt/Family understanding of illness & functional limitations: Pt and Dad can explain her fracture and WB status.  She is having pain issues and sleep issues but is trying.  She wants to do well so she can go home soon.  Dad reports: " We need to be able to get her inside the house and around the house." Premorbid pt/family roles/activities: Daughter, Elesa Hacker member, etc Anticipated changes in roles/activities/participation: resume Pt/family expectations/goals: Pt states: " I want to  be moving and go home soon."  Dad states: " I need to be able to get her into a vehicle and around our house, before she comes home."  [employer]: None Premorbid Home Care/DME Agencies: None Transportation available at discharge:  Family  Discharge Planning Living Arrangements: Parent Support Systems: Parent;Friends/neighbors;Church/faith community Type of Residence: Private residence Insurance Resources: Medicaid (specify county) Scientist, forensic Co) Surveyor, quantity Resources: Family Personnel officer Screen Referred: No Living Expenses: Lives with family Money Management: Family Does the patient have any problems obtaining your medications?: No Home Management: Parents Patient/Family Preliminary Plans: Return home with parents who can provide 24 hour assist, needs to be mobile due to neighbors will be with 3-4 hours in the afternoon.  Dad to be out of school in the next couple weeks and then have 2 weeks off.  Family here much of the time. Social Work Anticipated Follow Up Needs: HH/OP  Clinical Impression Pleasant female who is willing to work hard and wants to do well to get home.  Her father is supportive and here and states; he is her legal guardian.  Will work through he and pt on discharge planning. He has Already built a ramp into their home and will do other adaptations if needed.  He will be off in two weeks for two weeks and hopefully by then she will be moving better.  Will provide support and work on discharge.  Lucy Chris 10/10/2013, 11:23 AM

## 2013-10-10 NOTE — Progress Notes (Signed)
Occupational Therapy Session Note  Patient Details  Name: FAITHE ARIOLA MRN: [id number] Date of Birth: [birth year]  Today's Date: 10/10/2013 Time: 7829-5621 Time Calculation (min): 45 min  Short Term Goals: Week 1:  OT Short Term Goal 1 (Week 1): Focus on LTGs seondary to short ELOS  Skilled Therapeutic Interventions/Progress Updates:  Therapy session focused on functional transfers, activity tolerance, UE strengthening, and self-care tech to increase independence. Pt received in w/c. Completed stand pivot transfer w/c<>BSC with RW and increased time with max assist for sit<>stand and mod assist during pivoting. Pt following PWB precautions through LLE throughout. Discussed wiping technique using tongs and practiced simulated task. Pt very excited about this and father reported he will bring in tongs so pt can practice during actual toilet task. Required min assist for balance during simulated trial. Practiced sit<>stand x3 trials with min cues for positioning of BUE. Pt progressed from max assist to mod assist with rest breaks between. Completed theraband exercises as pt requiring more UB strengthening for support in standing during self-care tasks and functional ambulation. Completed 2 sets x 15 reps with rest breaks between each. Pt left sitting in w/c with all needs in reach and father present.   Therapy Documentation Precautions:  Precautions Precautions: Fall Restrictions Weight Bearing Restrictions: Yes LLE Weight Bearing: Partial weight bearing LLE Partial Weight Bearing Percentage or Pounds: 50% General:   Vital Signs:   Pain: Pt reporting 5/10 pain at LLE.  See FIM for current functional status  Therapy/Group: Individual Therapy  Daneil Dan 10/10/2013, 2:37 PM

## 2013-10-10 NOTE — Progress Notes (Signed)
Nursing Note: Pt calling again for pain med and this time s requesting morphine.Pt given 2 vicodin at 0111 and rated her pain at a 10.At 0200 ,pt was asleep.Pain awakened pt form sleep.Called on-call and ultram 50 mg given and will re-assess in 1 hr.wbb

## 2013-10-10 NOTE — Progress Notes (Signed)
Patient information reviewed and entered into eRehab system by Marciel Offenberger, RN, CRRN, PPS Coordinator.  Information including medical coding and functional independence measure will be reviewed and updated through discharge.    

## 2013-10-10 NOTE — Evaluation (Signed)
Physical Therapy Assessment and Plan  Patient Details  Name: Alexandra Kelley MRN: [id number] Date of Birth: [birth year]  PT Diagnosis: Abnormal posture, Difficulty walking, Edema, Impaired sensation, Muscle weakness and Pain in joint Rehab Potential: Good ELOS: 8-10 days   Today's Date: 10/10/2013 Time: 0900-1000 and 1400-1430 Time Calculation (min): 60 min and 30 min  Problem List:  Patient Active Problem List   Diagnosis Date Noted  . Femur fracture, left 10/09/2013  . Acute urinary retention 10/04/2013  . Acute-on-chronic respiratory failure 10/04/2013  . Femur fracture 10/03/2013  . Closed displaced oblique fracture of shaft of left femur 10/03/2013  . Hypothyroidism 10/03/2013  . Diabetes 10/03/2013    Past Medical History:  Past Medical History  Diagnosis Date  . Diabetes mellitus without complication     Dr. Lurene Shadow  . Thyroid disease   . GERD (gastroesophageal reflux disease)    Past Surgical History:  Past Surgical History  Procedure Laterality Date  . Tubes in ears    . Back surgery    . Femur im nail Left 10/03/2013    Procedure: INTRAMEDULLARY (IM) RETROGRADE FEMORAL NAILING;  Surgeon: Eldred Manges, MD;  Location: [facility] OR;  Service: Orthopedics;  Laterality: Left;    Assessment & Plan Clinical Impression: Alexandra Kelley is a 32 y.o. female with history of DM, thyroid disease, morbid obesity, who was admitted on 10/03/13 past fall and subsequent closed displaced oblique fracture of shaft of left femur. Mechanical fall that occurred due to patient tripping when she got up from dinner table. Patient with hypoxia and CT chest negative for PE. She was evaluated by Dr. Ophelia Charter and underwent IM nailing for repair on the same day. Post op PWB and foley placed for urinary retention. Patient transferred to CIR on 10/09/2013 .   Patient currently requires max with mobility secondary to muscle weakness, decreased cardiorespiratoy endurance, impaired timing and sequencing and  unbalanced muscle activation and decreased sitting balance, decreased standing balance, decreased postural control, decreased balance strategies and difficulty maintaining precautions.  Prior to hospitalization, patient was independent  with mobility and lived with Family in a House home.  Home access is Ramped entrance, as pt's father plans to install ramp at primary entrance of home  Patient will benefit from skilled PT intervention to maximize safe functional mobility and minimize fall risk for planned discharge home with intermittent assist.  Anticipate patient will benefit from follow up [facility] at discharge.  PT - End of Session Activity Tolerance: Tolerates 30+ min activity with multiple rests Endurance Deficit: Yes PT Assessment Rehab Potential: Good Barriers to Discharge Comments: Based on conversation with pt/parents, no barriers to D/C at this time. PT Patient demonstrates impairments in the following area(s): Balance;Edema;Endurance;Motor;Pain;Safety;Sensory PT Transfers Functional Problem(s): Bed Mobility;Bed to Chair;Car;Furniture PT Locomotion Functional Problem(s): Ambulation;Wheelchair Mobility;Stairs PT Plan PT Intensity: Minimum of 1-2 x/day ,45 to 90 minutes PT Frequency: 5 out of 7 days PT Duration Estimated Length of Stay: 8-10 days PT Treatment/Interventions: Ambulation/gait training;Balance/vestibular training;Discharge planning;DME/adaptive equipment instruction;Functional mobility training;Neuromuscular re-education;Pain management;Patient/family education;Therapeutic Exercise;Therapeutic Activities;Stair training;UE/LE Strength taining/ROM;Wheelchair propulsion/positioning PT Transfers Anticipated Outcome(s): Mod I-Supervision PT Locomotion Anticipated Outcome(s): Supervision- Min A PT Recommendation Follow Up Recommendations: Home health PT;24 hour supervision/assistance Patient destination: Home Equipment Recommended: Rolling walker with 5" wheels;To be  determined;Wheelchair (measurements);Wheelchair cushion (measurements) Equipment Details: Pt does not own any personal assistive devices, no wheelchair.  Skilled Therapeutic Intervention Treatment Session 1: PT evaluation initiated. See below for detailed findings. Session focused on increasing pt safety/independence with  functional mobility and gait. Gait x6', x4' (short seated rest break between trials ) with rolling walker requiring min-mod A until final 3', at which time pt required max A to maintain stability/balance while adhering to LLE PWB. In response to patient's father's questions concerning functional transfers/mobility allowed within pt room, therapist verbally explained importance of pt transferring only with assistance of staff member to decrease fall risk. Therapist departed with pt semi-reclined in bed with 3 bed rails up, bed alarm on, and all needs within reach.  Treatment Session 2: Pt received seated in w/c with mother/father present; agreeable to therapy. Session focused on increasing pt independence with bed mobility, w/c mobility, and management of w/c parts. See below for detailed description of assist/cueing required with bed mobility. Pt performed self-propulsion of standard w/c with bilat UE's x130' prior to requesting rest break secondary to fatigue. Therapist instructed pt in management of w/c parts. Therapist also verbally explained and demonstrated (to pt/parents) use of elevating w/c leg rest on L side to address LLE edema. Therapist departed with pt seated in w/c with mother present and all needs within reach.  PT Evaluation Precautions/Restrictions  Fall; LLE PWB General   Vital SignsTherapy Vitals Temp: 98.1 F (36.7 C) Temp src: Oral Pulse Rate: 103 Resp: 19 BP: 110/74 mmHg Patient Position, if appropriate: Sitting Oxygen Therapy SpO2: 100 % O2 Device: None (Room air) Pain Pain Assessment Pain Assessment: 0-10 Pain Score: 4  Pain Type: Surgical  pain Pain Location: Hip Pain Orientation: Left Pain Descriptors / Indicators: Aching Pain Onset: On-going Patients Stated Pain Goal: 3 Pain Intervention(s): Repositioned;Elevated extremity Multiple Pain Sites: No Home Living/Prior Functioning Home Living Available Help at Discharge: Family Type of Home: House Home Access: Ramped entrance Entrance Stairs-Number of Steps: Pt's father to install ramp at primary entrance of home Home Layout: Two level;Able to live on main level with bedroom/bathroom Alternate Level Stairs-Number of Steps: On first level of home, single (small) step in doorway. Alternate Level Stairs-Rails: None Additional Comments: 36" doorway to enter bathroom  Lives With: Family Prior Function Level of Independence: Independent with gait;Independent with transfers  Able to Take Stairs?: Yes Driving: No Vocation: On disability Vocation Requirements: Disability since birth Comments: Puzzles Vision/Perception  Vision - History Baseline Vision: Wears glasses all the time Patient Visual Report: No change from baseline Vision - Assessment Eye Alignment: Within Functional Limits Perception Perception: Within Functional Limits  Cognition Overall Cognitive Status: Within Functional Limits for tasks assessed Orientation Level: Oriented X4;Other (comment) (Able to orient self to date using visual aid in room; no cueing required.) Attention: Selective Selective Attention: Appears intact Memory: Appears intact Awareness: Appears intact Problem Solving: Appears intact Safety/Judgment: Appears intact Sensation Sensation Light Touch: Impaired by gross assessment Light Touch Impaired Details: Impaired LLE Proprioception: Appears Intact Additional Comments: Pt reports diminished light touch on LLE. Reports no h/o diabetic neuropathy. Unable to recall if sensory limitation was present prior to current hospitalization/injury. Coordination Gross Motor Movements are Fluid  and Coordinated: No Coordination and Movement Description: Pt demonstrates difficulty coordinating/sequencing gait with rolling walker. Motor  Motor Motor: Abnormal postural alignment and control Motor - Skilled Clinical Observations: Posterior pelvic tilt noted in seated. Pt unable to correct with visual/verbal cueing.  Mobility Bed Mobility Bed Mobility: Scooting to HOB;Sit to Supine;Supine to Sit Supine to Sit: 3: Mod assist;HOB flat Supine to Sit: Patient Percentage: 50% Supine to Sit Details (indicate cue type and reason): Supine>sit with maxA without rails, HOB flat; supervision with rails, HOB elevated.  Sit to Supine: 2: Max assist;3: Mod assist Sit to Supine: Patient Percentage: 50% Sit to Supine - Details: Tactile cues for placement;Verbal cues for technique Sit to Supine - Details (indicate cue type and reason): Sit>supine with modA during initial session, with maxA during second session due to pt fatigue Scooting to [facility]: 2: Max assist Scooting to [facility]: Patient Percentage: 40% Scooting to [facility] Details: Tactile cues for placement;Verbal cues for technique;Verbal cues for precautions/safety Scooting to [facility] Details (indicate cue type and reason): Scooting to [facility] with supervision, bed rails with bed in Trendelenberg position. Max A with HOB flat; verbal cueing for hand placement and to maintain LLE PWB. Transfers Transfers: Yes Sit to Stand: 2: Max assist;From bed;From chair/3-in-1;3: Mod assist;With armrests;With upper extremity assist Sit to Stand Details: Verbal cues for technique;Verbal cues for precautions/safety Sit to Stand Details (indicate cue type and reason): Mod A from [facility] during initial session; maxA (for anterior weight shift, bilat hip extension) from w/c during second session. Stand to Sit: 4: Min guard;To chair/3-in-1;To bed Stand Pivot Transfers: 2: Max assist;With armrests Stand Pivot Transfer Details: Verbal cues for technique;Verbal cues for  precautions/safety Stand Pivot Transfer Details (indicate cue type and reason): Stand-pivot from bed<>w/c with rolling walker with maxA, verbal cues to adhere to LLE PWB. Locomotion  Ambulation Ambulation: Yes Ambulation/Gait Assistance: 2: Max assist Ambulation Distance (Feet): 10 Feet Assistive device: Rolling walker Ambulation/Gait Assistance Details: Verbal cues for technique;Verbal cues for precautions/safety Ambulation/Gait Assistance Details: Gait x6', x4' with rolling walker, max A and max verbal cues required to maintain LLE PWB. Verbal cues for sequencing/technique.  Gait Gait: Yes Gait Pattern: Impaired Gait Pattern: Step-to pattern;Decreased stance time - left;Decreased step length - right;Decreased dorsiflexion - left;Decreased weight shift to left;Trunk flexed Gait velocity: decreased Stairs / Additional Locomotion Stairs: No (Not formally assessed secondary to pt difficulty adhering to WB restrictions with standing, and gait.) Wheelchair Mobility Wheelchair Mobility: Yes Wheelchair Assistance: 5: Investment banker, operational: Both upper extremities Wheelchair Parts Management: Needs assistance Distance: 130  Trunk/Postural Assessment  Cervical Assessment Cervical Assessment: Within Functional Limits Thoracic Assessment Thoracic Assessment: Within Functional Limits Lumbar Assessment Lumbar Assessment: Within Functional Limits Postural Control Postural Control: Deficits on evaluation Trunk Control: Pt demonstrates difficulty maintaining upright posture without back support; tendency toward posterior pevlic tilt.  Balance Balance Balance Assessed: Yes Static Sitting Balance Static Sitting - Balance Support: Feet supported;Right upper extremity supported Static Sitting - Level of Assistance: 5: Stand by assistance Static Sitting - Comment/# of Minutes: 4 Dynamic Sitting Balance Dynamic Sitting - Level of Assistance: 5: Stand by assistance Dynamic Sitting -  Comments: Dynamic sitting balance (reaching forward, laterally, and across midline with bilat UE's) with single UE support. Static Standing Balance Static Standing - Balance Support: Bilateral upper extremity supported (at rolling walker) Static Standing - Level of Assistance: 4: Min assist Dynamic Standing Balance Dynamic Standing - Balance Support: Bilateral upper extremity supported Dynamic Standing - Level of Assistance: 3: Mod assist;4: Min assist Dynamic Standing - Balance Activities: Lateral lean/weight shifting;Forward lean/weight shifting;Reaching for objects;Reaching across midline Dynamic Standing - Comments: Min A to reach R od midline; Mod A to reach L of midline to adhere to LLE PWB. Extremity Assessment  RLE Assessment RLE Assessment: Within Functional Limits LLE Assessment LLE Assessment: Exceptions to [facility] LLE Strength LLE Overall Strength Comments: Observation of functional mobility suggests grossly 3+/5 L hip abduction/extension, 4-/5 L hip flexion/adductionl; and grossly 4/5 strength in L knee/ankle in all planes. FIM:  FIM - Bed/Chair  Transport planner Devices: Bed rails;Arm rests;Walker Bed/Chair Transfer: 2: Chair or W/C > Bed: Max A (lift and lower assist);2: Bed > Chair or W/C: Max A (lift and lower assist);2: Sit > Supine: Max A (lifting assist/Pt. 25-49%);2: Supine > Sit: Max A (lifting assist/Pt. 25-49%) FIM - Locomotion: Wheelchair Distance: 130 Locomotion: Wheelchair: 2: Travels 50 - 149 ft with supervision, cueing or coaxing FIM - Locomotion: Ambulation Ambulation/Gait Assistance: 2: Max assist   Refer to Care Plan for Long Term Goals  Recommendations for other services: None  Discharge Criteria: Patient will be discharged from PT if patient refuses treatment 3 consecutive times without medical reason, if treatment goals not met, if there is a change in medical status, if patient makes no progress towards goals or if patient is  discharged from hospital.  The above assessment, treatment plan, treatment alternatives and goals were discussed and mutually agreed upon: by patient and by family  Calvert Cantor 10/10/2013, 6:52 PM

## 2013-10-10 NOTE — Evaluation (Signed)
Occupational Therapy Assessment and Plan  Patient Details  Name: Alexandra Kelley MRN: [id number] Date of Birth: Dec 21, 1980  OT Diagnosis: muscle weakness (generalized) and pain in joint Rehab Potential:  excellent ELOS:   8-10 days  Today's Date: 10/10/2013 Time: 7829-5621 Time Calculation (min): 65 min  Problem List:  Patient Active Problem List   Diagnosis Date Noted  . Femur fracture, left 10/09/2013  . Acute urinary retention 10/04/2013  . Acute-on-chronic respiratory failure 10/04/2013  . Femur fracture 10/03/2013  . Closed displaced oblique fracture of shaft of left femur 10/03/2013  . Hypothyroidism 10/03/2013  . Diabetes 10/03/2013    Past Medical History:  Past Medical History  Diagnosis Date  . Diabetes mellitus without complication     Dr. Lurene Shadow  . Thyroid disease   . GERD (gastroesophageal reflux disease)    Past Surgical History:  Past Surgical History  Procedure Laterality Date  . Tubes in ears    . Back surgery    . Femur im nail Left 10/03/2013    Procedure: INTRAMEDULLARY (IM) RETROGRADE FEMORAL NAILING;  Surgeon: Eldred Manges, MD;  Location: [facility] OR;  Service: Orthopedics;  Laterality: Left;    Assessment & Plan Clinical Impression: Patient is a 32 y.o. female with history of DM, thyroid disease, morbid obesity, who was admitted on 10/03/13 past fall and subsequent closed displaced oblique fracture of shaft of left femur. Mechanical fall that occurred due to patient tripping when she got up from dinner table. Patient with hypoxia and CT chest negative for PE. She was evaluated by Dr. Ophelia Charter and underwent IM nailing for repair on the same day. Post op PWB and foley placed for urinary retention. PT evaluation done and patient limited by pain. Also continues to be Hypoxic requiring 3 L oxygen with activity. Leucocytosis resolving. Patient transferred to CIR on 10/09/2013 .    Patient currently requires total with LB dressing, mod assist bathing, max assist  functional transfers (mainly sit<>stand) secondary to muscle weakness, muscle joint tightness and pain and decreased cardiorespiratoy endurance.  Prior to hospitalization, patient could complete bathing with min assist, toileting with mod assist and all other self-care tasks and functional transfers with independent .  Patient will benefit from skilled intervention to increase independence with basic self-care skills prior to discharge home with parents.  Anticipate patient will require min assist with bathing, mod assist with toileting, and no assistance with functional transfers and dressing and follow up home health.     Skilled Therapeutic Intervention Pt seen for ADL retraining this AM with focus on activity tolerance, sit<>stand, and standing balance. Pt's mother "tammy" present during session. Pt reporting 10/10 pain during therapy session at LLE and had received pain meds prior to session. Pt became tearful on 2 occasions during therapy secondary to pain and anticipating pain prior to sit<>stand. Pt recalled PWB status of LLE and required mod-max assist for sit<>stand then min assist for standing balance during LB dressing. Pt's HR increased to 114-116 after sit<>stand and would decline within 10 sec to 102. SpO2>94% throughout session. Pt required total assist for LB dressing and mod assist bathing secondary to no AE at eval however pt reported being familiar with AE and will practice with it tomorrow. Pt declining transferring from recliner secondary to pain however agreed to practice this in PM. Pt required frequent rest breaks d/t fatigue and pain. At end of session pt left sitting in recliner chair with all needs in reach.    OT Evaluation Precautions/Restrictions  Precautions Precautions: Fall Restrictions Weight Bearing Restrictions: Yes LLE Weight Bearing: Partial weight bearing LLE Partial Weight Bearing Percentage or Pounds: 50% General   Vital Signs Therapy Vitals Temp: 98.8  F (37.1 C) Temp src: Oral Pulse Rate: 100 Resp: 19 BP: 113/58 mmHg Patient Position, if appropriate: Lying Oxygen Therapy SpO2: 93 % O2 Device: None (Room air) Pain Pain Assessment Pain Assessment: 0-10 Pain Score: 5  Pain Location: Hip Pain Orientation: Left Pain Descriptors / Indicators: Aching Pain Onset: On-going Patients Stated Pain Goal: 3 Pain Intervention(s): Other (Comment);Repositioned (per pt/family, RN is aware) Multiple Pain Sites: No Home Living/Prior Functioning Home Living Type of Home: House Home Access: Ramped entrance Entrance Stairs-Number of Steps: Pt's father to install ramp at primary entrance of home Home Layout: Two level;Able to live on main level with bedroom/bathroom Alternate Level Stairs-Number of Steps: On first level of home, single (small) in doorway. Alternate Level Stairs-Rails: None Additional Comments: 36" doorway to enter bathroom  Lives With: Family Prior Function Level of Independence: Independent with gait;Independent with transfers  Able to Take Stairs?: Yes Driving: No Vocation: Other (comment) Vocation Requirements: Disability since birth Leisure: Hobbies-yes (Comment) Comments: Puzzles ADL   Vision/Perception  Vision - History Baseline Vision: Wears glasses all the time  Cognition Overall Cognitive Status: Within Functional Limits for tasks assessed Arousal/Alertness: Awake/alert Orientation Level: Oriented X4;Other (comment) (uses visual aid to orient self to time; no cueing) Sensation   Motor    Mobility     Trunk/Postural Assessment     Balance   Extremity/Trunk Assessment      FIM:  FIM - Grooming Grooming Steps: Wash, rinse, dry face;Wash, rinse, dry hands;Oral care, brush teeth, clean dentures Grooming: 4: Patient completes 3 of 4 or 4 of 5 steps FIM - Bathing Bathing Steps Patient Completed: Chest;Right Arm;Left Arm;Abdomen;Right upper leg;Left upper leg Bathing: 3: Mod-Patient completes 5-7 52f 10  parts or 50-74% FIM - Upper Body Dressing/Undressing Upper body dressing/undressing steps patient completed: Thread/unthread right bra strap;Thread/unthread left bra strap;Hook/unhook bra;Thread/unthread right sleeve of pullover shirt/dresss;Thread/unthread left sleeve of pullover shirt/dress;Put head through opening of pull over shirt/dress;Pull shirt over trunk Upper body dressing/undressing: 5: Set-up assist to: Obtain clothing/put away FIM - Lower Body Dressing/Undressing Lower body dressing/undressing: 1: Total-Patient completed less than 25% of tasks   Refer to Care Plan for Long Term Goals  Recommendations for other services: None  Discharge Criteria: Patient will be discharged from OT if patient refuses treatment 3 consecutive times without medical reason, if treatment goals not met, if there is a change in medical status, if patient makes no progress towards goals or if patient is discharged from hospital.  The above assessment, treatment plan, treatment alternatives and goals were discussed and mutually agreed upon: by patient and by family  Daneil Dan 10/10/2013, 9:17 AM

## 2013-10-11 ENCOUNTER — Inpatient Hospital Stay (HOSPITAL_COMMUNITY): Payer: Medicaid Other

## 2013-10-11 ENCOUNTER — Inpatient Hospital Stay (HOSPITAL_COMMUNITY): Payer: Medicaid Other | Admitting: Physical Therapy

## 2013-10-11 LAB — GLUCOSE, CAPILLARY
Glucose-Capillary: 147 mg/dL — ABNORMAL HIGH (ref 70–99)
Glucose-Capillary: 170 mg/dL — ABNORMAL HIGH (ref 70–99)
Glucose-Capillary: 151 mg/dL — ABNORMAL HIGH (ref 70–99)

## 2013-10-11 LAB — URINE CULTURE
Colony Count: NO GROWTH
Culture: NO GROWTH

## 2013-10-11 MED ORDER — METHOCARBAMOL 500 MG PO TABS
1000.0000 mg | ORAL_TABLET | Freq: Two times a day (BID) | ORAL | Status: DC
  Administered 2013-10-11 – 2013-10-12 (×4): 1000 mg via ORAL
  Filled 2013-10-11 (×7): qty 2

## 2013-10-11 MED ORDER — METHOCARBAMOL 500 MG PO TABS
500.0000 mg | ORAL_TABLET | Freq: Two times a day (BID) | ORAL | Status: DC
  Administered 2013-10-11 – 2013-10-13 (×4): 500 mg via ORAL
  Filled 2013-10-11 (×7): qty 1

## 2013-10-11 MED ORDER — ENOXAPARIN SODIUM 80 MG/0.8ML ~~LOC~~ SOLN
75.0000 mg | SUBCUTANEOUS | Status: DC
  Administered 2013-10-11 – 2013-10-14 (×4): 75 mg via SUBCUTANEOUS
  Administered 2013-10-15: 20:00:00 via SUBCUTANEOUS
  Administered 2013-10-16 – 2013-10-19 (×4): 75 mg via SUBCUTANEOUS
  Filled 2013-10-11 (×10): qty 0.8

## 2013-10-11 MED ORDER — MORPHINE SULFATE ER 30 MG PO TBCR
30.0000 mg | EXTENDED_RELEASE_TABLET | Freq: Every day | ORAL | Status: DC
  Administered 2013-10-11 – 2013-10-14 (×4): 30 mg via ORAL
  Filled 2013-10-11 (×4): qty 1

## 2013-10-11 NOTE — Progress Notes (Signed)
Pt refuses CPAP at this time.  

## 2013-10-11 NOTE — Patient Care Conference (Signed)
Inpatient RehabilitationTeam Conference and Plan of Care Update Date: 10/11/2013   Time: 10:45 AM    Patient Name: KEIRSTON SAEPHANH      Medical Record Number: [id number]  Date of Birth: Oct 29, 1981 Sex: Female         Room/Bed: 4W10C/4W10C-01 Payor Info: Payor: MEDICAID [place] / Plan: MEDICAID [place] ACCESS / Product Type: *No Product type* /    Admitting Diagnosis: L FEMORAL SHAFT FRACTURE  Admit Date/Time:  10/09/2013  4:08 PM Admission Comments: No comment available   Primary Diagnosis:  <principal problem not specified> Principal Problem: <principal problem not specified>  Patient Active Problem List   Diagnosis Date Noted  . Femur fracture, left 10/09/2013  . Acute urinary retention 10/04/2013  . Acute-on-chronic respiratory failure 10/04/2013  . Femur fracture 10/03/2013  . Closed displaced oblique fracture of shaft of left femur 10/03/2013  . Hypothyroidism 10/03/2013  . Diabetes 10/03/2013    Expected Discharge Date: Expected Discharge Date: 10/20/13  Team Members Present: Physician leading conference: Dr. Claudette Laws Social Worker Present: Dossie Der, LCSW Nurse Present: Carlean Purl, RN PT Present: Edson Snowball, PT;Emily Parcell, PT OT Present: Rosalio Loud, OT;Kris Gellert, Heath Lark, OT SLP Present: Fae Pippin, SLP PPS Coordinator present : Tora Duck, RN, CRRN     Current Status/Progress Goal Weekly Team Focus  Medical   down's syndrome morbid obesity, Left midshaft femur fracture  improve pain  med adjustment   Bowel/Bladder   Patient continent of bowel and bladder  Remain continent of bowel and bladder and call for assistance before attempting to get up  Remember PWB status when transfering to [facility] or toilet   Swallow/Nutrition/ Hydration    [facility]         ADL's   total assist LB dressing (w/o AE), mod assist bathing, setup UB dressing, max assist transfers; pain is biggest barrier at this time  Mod I functional transfers and dressing,  mod assist toileting, min assist bathing   activity tolerance, functional transfers, strengthening, sit<>stand, use of AE, education    Mobility   Gait x10 with mod A, bed<>w/c with mod A, w/c mobility x150' with supervision  Gait x50' with supervision, bed<>w/c with mod I  increasing functional endurance, increasing gait distance while adhering to WB restrictions   Communication     [facility]        Safety/Cognition/ Behavioral Observations    no safety issues        Pain   Vicodin PRN q 6hrs, Robaxin 500mg  4 times daily, Morphine 15mg . PO daily at bedtime  >3 on a scale of 0-10  Assess pain q shift and reassess after pain interventions   Skin   4 small incision sites to Lf. knee, 1 small incision site to Lf. upper thigh  Patient to have no skin breakdown or infection while on rehab  Assess skin for breakdown and infection q shift      *See Care Plan and progress notes for long and short-term goals.  Barriers to Discharge: obesity    Possible Resolutions to Barriers:  will need some assist from caregiver for ADLs    Discharge Planning/Teaching Needs:  Home wiht parents and neighbors who can provide 24 hour care      Team Discussion:  Doing well-making good progress.  Pain issues at night MD addressing adjusting meds.  Cont B & B.  Adjusting well to the unit  Revisions to Treatment Plan:  No issues   Continued Need for Acute Rehabilitation Level of  Care: The patient requires daily medical management by a physician with specialized training in physical medicine and rehabilitation for the following conditions: Daily direction of a multidisciplinary physical rehabilitation program to ensure safe treatment while eliciting the highest outcome that is of practical value to the patient.: Yes Daily medical management of patient stability for increased activity during participation in an intensive rehabilitation regime.: Yes Daily analysis of laboratory values and/or radiology reports with any  subsequent need for medication adjustment of medical intervention for : Neurological problems  Margarine Grosshans, Lemar Livings 10/12/2013, 10:39 AM

## 2013-10-11 NOTE — IPOC Note (Addendum)
Overall Plan of Care [facility]) Patient Details Name: Alexandra Kelley MRN: [id number] DOB: July 13, 1981  Admitting Diagnosis: L FEMORAL SHAFT FRACTURE  Hospital Problems: Active Problems:   Hypothyroidism   Diabetes   Femur fracture, left     Functional Problem List: Nursing Edema;Endurance;Medication Management;Motor;Pain;Perception;Skin Integrity  PT Balance;Edema;Endurance;Motor;Pain;Safety;Sensory  OT Balance;Endurance;Motor;Pain  SLP    TR  balance,activity tolerance, pain, safety       Basic ADL's: OT Grooming;Bathing;Toileting;Dressing     Advanced  ADL's: OT Light Housekeeping     Transfers: PT Bed Mobility;Bed to Chair;Car;Furniture  OT Toilet;Tub/Shower     Locomotion: PT Ambulation;Wheelchair Mobility;Stairs     Additional Impairments: OT None  SLP        TR      Anticipated Outcomes Item Anticipated Outcome  Self Feeding    Swallowing      Basic self-care  Mod I dressing and transfers, min assist bathing  Toileting  mod assist   Bathroom Transfers Mod I  Bowel/Bladder  Continent of bowel and bladder.  Transfers  Mod I-Supervision  Locomotion  Supervision- Min A  Communication     Cognition     Pain  </=3  Safety/Judgment  No falls with injury.   Therapy Plan: PT Intensity: Minimum of 1-2 x/day ,45 to 90 minutes PT Frequency: 5 out of 7 days PT Duration Estimated Length of Stay: 8-10 days OT Intensity: Minimum of 1-2 x/day, 45 to 90 minutes OT Frequency: 5 out of 7 days OT Duration/Estimated Length of Stay: 8-10 days         Team Interventions: Nursing Interventions Patient/Family Education;Disease Management/Prevention;Pain Management;Medication Management;Skin Care/Wound Management;Psychosocial Support  PT interventions Ambulation/gait training;Balance/vestibular training;Discharge planning;DME/adaptive equipment instruction;Functional mobility training;Neuromuscular re-education;Pain management;Patient/family  education;Therapeutic Exercise;Therapeutic Activities;Stair training;UE/LE Strength taining/ROM;Wheelchair propulsion/positioning  OT Interventions Balance/vestibular training;Community reintegration;Discharge planning;DME/adaptive equipment instruction;Functional mobility training;Pain management;Psychosocial support;Patient/family education;Self Care/advanced ADL retraining;Therapeutic Activities;Therapeutic Exercise;UE/LE Strength taining/ROM;UE/LE Coordination activities  SLP Interventions    TR Interventions  recreation/leisure participation, balance training, community reintegration, adaptive equipment instruction, functional mobility,pain management, psychosocial support, patient/family education  SW/CM Interventions Discharge Planning;Psychosocial Support;Patient/Family Education    Team Discharge Planning: Destination: PT-Home ,OT- Home , SLP-  Projected Follow-up: PT-Home health PT;24 hour supervision/assistance, OT-  Home health OT, SLP-  Projected Equipment Needs: PT-Rolling walker with 5" wheels;To be determined;Wheelchair (measurements);Wheelchair cushion (measurements), OT- 3 in 1 bedside comode;Tub/shower bench, SLP-  Equipment Details: PT-Pt does not own any personal assistive devices, no wheelchair., OT-  Patient/family involved in discharge planning: PT- Patient;Family member/caregiver,  OT-Patient;Family member/caregiver, SLP-   MD ELOS: 7-10 days Medical Rehab Prognosis:  Excellent Assessment: 32 y.o. female with history of DM, thyroid disease, morbid obesity, who was admitted on 10/03/13 past fall and subsequent closed displaced oblique fracture of shaft of left femur. Mechanical fall that occurred due to patient tripping when she got up from dinner table. Patient with hypoxia and CT chest negative for PE. She was evaluated by Dr. Ophelia Charter and underwent IM nailing for repair on the same day. Post op PWB and foley placed for urinary retention. PT evaluation done and patient limited  by pain. Also continues to be Hypoxic requiring 3 L oxygen with activity. Leucocytosis resolving  Now requiring 24/7 Rehab RN,MD, as well as CIR level PT, OT and SLP.  Treatment team will focus on ADLs and mobility with goals set at    See Team Conference Notes for weekly updates to the plan of care

## 2013-10-11 NOTE — Progress Notes (Signed)
Patient refused CPAP tonight.  Informed by RT to let nurse know if she changed her mind, RT would set up if wanted.

## 2013-10-11 NOTE — Progress Notes (Signed)
Occupational Therapy Session Note  Patient Details  Name: Alexandra Kelley MRN: [id number] Date of Birth: 1981-09-13  Today's Date: 10/11/2013 Time: 4696-2952 and 8413-2440 Time Calculation (min): 65 min and 44 min   Short Term Goals: Week 1:  OT Short Term Goal 1 (Week 1): Focus on LTGs seondary to short ELOS  Skilled Therapeutic Interventions/Progress Updates:    Session 1: Therapy session focused on activity tolerance, use of AE, functional transfers, and sit<>stand. Pt received in recliner chair and requesting to complete toilet task. Completed stand pivot transfer with RW to [facility] with max assist for sit>stand and min assist for pivoting. Pt tearful on first trial of sit<>stand secondary to pain. Pt following PWB precautions during transfer. Pt then required mod assist for sit<>stand from [facility] and min assist to pivot to w/c. Completed bathing at sink with frequent rest breaks secondary to fatigue. Used LH sponge for peri hygiene and lower BLE requiring assist for thoroughness for peri hygiene. Completed dressing using reacher for undergarments as pt declining using for pants secondary to pain and fatigue. Required max assist for sit<>stand during LB bathing and pt assisting with managing pants over R hip. Pt agreed to practice more with AE this PM. Pt left sitting in w/c with mother present and all needs in reach. Pt very motivated about sit<>stand during therapy sessions.   Session 2: Therapy session focused on sit<>stand, standing tolerance/balance, activity tolerance, and use of AE for LB dressing. Educated and provided Production manager for threading LEs into pants. Pt returned demonstration with mod cues and increased time as pt threaded into wrong leg hole x4. Pt requiring rest breaks throughout task. Pt completed 1 successful trial and very motivated. Practiced using sock-aid and pt returned demo with min verbal cues and slightly increased time. Pt propelled self in w/c to day  room for UE strengthening and activity tolerance. Pt then engaged in standing task to hang ornaments on christmas tree. Pt required mod assist for sit<>stand and min guard for standing balance. Pt tolerated approx 45 sec standing before requiring rest break. Required cues of encouragement as pt gets frustrated when she begins to have difficulty. Completed sit<>stand twice during this task before pt propelled self back to room and left with all needs in reach.   Therapy Documentation Precautions:  Precautions Precautions: Fall Restrictions Weight Bearing Restrictions: Yes LLE Weight Bearing: Partial weight bearing LLE Partial Weight Bearing Percentage or Pounds: 50% General:   Vital Signs: Therapy Vitals Temp: 98.8 F (37.1 C) Temp src: Oral Pulse Rate: 106 Resp: 17 BP: 119/44 mmHg Oxygen Therapy SpO2: 93 % O2 Device: None (Room air) Pain: Session 1: Pt reporting 10/10 pain at LLE however did not appear to have pain of this severity during functional transfers and sit<>stand.  Session 2: Pt reporting 5/10 pain at LLE.    See FIM for current functional status  Therapy/Group: Individual Therapy  Daneil Dan 10/11/2013, 8:43 AM

## 2013-10-11 NOTE — Progress Notes (Signed)
Physical Therapy Session Note  Patient Details  Name: Alexandra Kelley MRN: [id number] Date of Birth: 09-Nov-1980  Today's Date: 10/11/2013 Time: 8295-6213 and 0865-7846 Time Calculation (min): 59 min and 27 min  Short Term Goals: Week 1:  PT Short Term Goal 1 (Week 1): Pt to perform supine<>sit transfer with consistent min A with HOB flat. PT Short Term Goal 2 (Week 1): Pt to perform bed<>chair transfer with consistent min A, min cueing to adhere to LLE PWB. PT Short Term Goal 3 (Week 1): Pt to ambulate 25' with LRAD requiring min A, min cueing to adhere to LLE PWB. PT Short Term Goal 4 (Week 1): Pt to perform self-propulsion of w/c x150' without rest break for functional endurance. PT Short Term Goal 5 (Week 1): Pt to negotiate single step without rails using LRAD with mod A.  Skilled Therapeutic Interventions/Progress Updates:    Treatment Session 1: Pt received seated in w/c accompanied by father. Pt agreeable to therapy. Session focused on gait training (emphasis on adhering to LLE WB restrictions), functional activity tolerance, and functional transfers. Pt performed self-propulsion of w/c using bilat UE's with supervision x130' prior to requesting rest break secondary to fatigue.  Pre-gait with bilat UE support at parallel bars with pt wearing weightbearing sensor (setting at 50%) at LLE to provide pt feedback of weightbearing adherence. Pre-gait activities focused on facilitating weightbearing through UE's, initiation of RLE swing phase of gait without auditory feedback of exceeding 50% weightbearing on LLE. After 4 pre-gait trials, pt's father (present for session thus far) pt's father verbalized that patient should disregard the sound of the weightbearing sensor on LLE. Therapist educated father that pt should not disregard sensor, as it is important that pt maintain LLE WB restrictions. After additional trial, pt's father began giving pt very harsh criticism in raised tone of voice,  inquiring as to why pt can't "get it through your head," per father. Pt became tearful and non-participatory. Therapist politely asked father to leave gym for remainder of session due to father's criticism impeding patient performance. Father in agreement; departed gym soon thereafter.  Attempted gait x2 additional trials: initial trial x4' with rolling walker and mod A; pt did not exceed 50% WB on LLE, per weightbearing sensor. Sensor removed. Sequential gait trial x8' with rolling walker and mod A. Therapist transported pt to room in w/c secondary to pt fatigue. Once in room, pt requested to use bedside commode. Stand-pivot from w/c<>bedside commode with rolling walker and mod A, manual facilitation of anterior weight shift, verbal cues for hand placement. Therapist departed with pt seated in w/c, CNA present, and all needs within reach.  Treatment Session 2: Pt received seated in w/c; no family present. Pt agreeable to session. Session focused on functional endurance, gait stability. Pt performed self-propulsion of w/c using bilat UE's with supervision x150' with supervision, no rest breaks. Gait x10' with rolling walker, mod A, and max encouragement to continue after initial 2 steps. Pt appeared to maintain LLE PWB throughout gait trial. See below for detailed description of dynamic sitting balance activity. Self-propulsion of w/c x150' with supervision to return to pt room. Seated rest breaks (1-2 minutes each) taken, per pt request, between all gait and w/c mobility trials. Therapist departed pt room with pt seated in w/c with all needs within reach.  Therapy Documentation Precautions:  Precautions Precautions: Fall Restrictions Weight Bearing Restrictions: Yes LLE Weight Bearing: Partial weight bearing LLE Partial Weight Bearing Percentage or Pounds: 50% Pain: Pain Assessment Pain  Assessment: 0-10 Pain Score: 5  Pain Type: Surgical pain Pain Location: Leg Pain Orientation: Left Pain  Descriptors / Indicators: Aching Pain Onset: On-going Patients Stated Pain Goal: 3 Pain Intervention(s): Repositioned;RN made aware;Elevated extremity Multiple Pain Sites: No Locomotion : Ambulation Ambulation/Gait Assistance: 3: Mod assist Wheelchair Mobility Distance: 130  See FIM for current functional status  Therapy/Group: Individual Therapy  Calvert Cantor 10/11/2013, 7:44 PM

## 2013-10-11 NOTE — Progress Notes (Signed)
Social Work Patient ID: Alexandra Kelley, female   DOB: December 08, 1980, 32 y.o.   MRN: [id number] Met with pt and Dad to inform of team conference goals-mod/i-min assist and discharge date 12/19.  Both are pleased with how well she is doing. Dad is off until Jan 5 and will be assisting.  Pt feels her pain is better and is trying in therapies.  She is glad to have a discharge date to work toward. Work on discharge needs.

## 2013-10-11 NOTE — Progress Notes (Addendum)
Subjective/Complaints: Pain kept her awake last noc Drifted off to sleep after pain med then up again Review of Systems - Negative except Left thigh pain Objective: Vital Signs: Blood pressure 119/44, pulse 106, temperature 98.8 F (37.1 C), temperature source Oral, resp. rate 17, height 5\' 3"  (1.6 m), weight 150.141 kg (331 lb), SpO2 93.00%. No results found. Results for orders placed during the hospital encounter of 10/09/13 (from the past 72 hour(s))  GLUCOSE, CAPILLARY     Status: Abnormal   Collection Time    10/09/13  4:52 PM      Result Value Range   Glucose-Capillary 224 (*) 70 - 99 mg/dL   Comment 1 Notify RN    URINALYSIS, ROUTINE W REFLEX MICROSCOPIC     Status: Abnormal   Collection Time    10/09/13  6:40 PM      Result Value Range   Color, Urine YELLOW  YELLOW   APPearance CLEAR  CLEAR   Specific Gravity, Urine 1.012  1.005 - 1.030   pH 6.5  5.0 - 8.0   Glucose, UA NEGATIVE  NEGATIVE mg/dL   Hgb urine dipstick NEGATIVE  NEGATIVE   Bilirubin Urine NEGATIVE  NEGATIVE   Ketones, ur NEGATIVE  NEGATIVE mg/dL   Protein, ur NEGATIVE  NEGATIVE mg/dL   Urobilinogen, UA 0.2  0.0 - 1.0 mg/dL   Nitrite NEGATIVE  NEGATIVE   Leukocytes, UA TRACE (*) NEGATIVE  URINE CULTURE     Status: None   Collection Time    10/09/13  6:40 PM      Result Value Range   Specimen Description URINE, CLEAN CATCH     Special Requests NONE     Culture  Setup Time       Value: 10/10/2013 02:21     Performed at [facility] Count       Value: NO GROWTH     Performed at [facility]   Culture       Value: NO GROWTH     Performed at [facility]   Report Status 10/11/2013 FINAL    URINE MICROSCOPIC-ADD ON     Status: Abnormal   Collection Time    10/09/13  6:40 PM      Result Value Range   Squamous Epithelial / LPF RARE  RARE   WBC, UA 3-6  <3 WBC/hpf   Bacteria, UA FEW (*) RARE  GLUCOSE, CAPILLARY     Status: Abnormal   Collection Time    10/09/13   8:59 PM      Result Value Range   Glucose-Capillary 296 (*) 70 - 99 mg/dL   Comment 1 Notify RN    CBC WITH DIFFERENTIAL     Status: Abnormal   Collection Time    10/10/13  5:50 AM      Result Value Range   WBC 13.9 (*) 4.0 - 10.5 K/uL   RBC 3.81 (*) 3.87 - 5.11 MIL/uL   Hemoglobin 11.2 (*) 12.0 - 15.0 g/dL   HCT 16.1 (*) 09.6 - 04.5 %   MCV 91.1  78.0 - 100.0 fL   MCH 29.4  26.0 - 34.0 pg   MCHC 32.3  30.0 - 36.0 g/dL   RDW 40.9  81.1 - 91.4 %   Platelets 316  150 - 400 K/uL   Neutrophils Relative % 68  43 - 77 %   Neutro Abs 9.4 (*) 1.7 - 7.7 K/uL   Lymphocytes Relative 23  12 - 46 %   Lymphs Abs 3.2  0.7 - 4.0 K/uL   Monocytes Relative 5  3 - 12 %   Monocytes Absolute 0.7  0.1 - 1.0 K/uL   Eosinophils Relative 3  0 - 5 %   Eosinophils Absolute 0.5  0.0 - 0.7 K/uL   Basophils Relative 1  0 - 1 %   Basophils Absolute 0.1  0.0 - 0.1 K/uL  COMPREHENSIVE METABOLIC PANEL     Status: Abnormal   Collection Time    10/10/13  5:50 AM      Result Value Range   Sodium 138  135 - 145 mEq/L   Potassium 3.5  3.5 - 5.1 mEq/L   Chloride 94 (*) 96 - 112 mEq/L   CO2 34 (*) 19 - 32 mEq/L   Glucose, Bld 110 (*) 70 - 99 mg/dL   BUN 9  6 - 23 mg/dL   Creatinine, Ser 1.61  0.50 - 1.10 mg/dL   Calcium 9.4  8.4 - 09.6 mg/dL   Total Protein 6.8  6.0 - 8.3 g/dL   Albumin 2.9 (*) 3.5 - 5.2 g/dL   AST 54 (*) 0 - 37 U/L   ALT 53 (*) 0 - 35 U/L   Alkaline Phosphatase 57  39 - 117 U/L   Total Bilirubin 0.5  0.3 - 1.2 mg/dL   GFR calc non Af Amer >90  >90 mL/min   GFR calc Af Amer >90  >90 mL/min   Comment: (NOTE)     The eGFR has been calculated using the CKD EPI equation.     This calculation has not been validated in all clinical situations.     eGFR's persistently <90 mL/min signify possible Chronic Kidney     Disease.  GLUCOSE, CAPILLARY     Status: Abnormal   Collection Time    10/10/13  7:35 AM      Result Value Range   Glucose-Capillary 116 (*) 70 - 99 mg/dL   Comment 1 Notify RN     GLUCOSE, CAPILLARY     Status: Abnormal   Collection Time    10/10/13 10:59 AM      Result Value Range   Glucose-Capillary 199 (*) 70 - 99 mg/dL   Comment 1 Notify RN    GLUCOSE, CAPILLARY     Status: Abnormal   Collection Time    10/10/13  4:40 PM      Result Value Range   Glucose-Capillary 133 (*) 70 - 99 mg/dL   Comment 1 Notify RN    GLUCOSE, CAPILLARY     Status: Abnormal   Collection Time    10/10/13  8:49 PM      Result Value Range   Glucose-Capillary 151 (*) 70 - 99 mg/dL   Comment 1 Notify RN    GLUCOSE, CAPILLARY     Status: Abnormal   Collection Time    10/11/13  7:27 AM      Result Value Range   Glucose-Capillary 147 (*) 70 - 99 mg/dL   Comment 1 Notify RN       HEENT: normal Cardio: RRR and no murmur Resp: CTA B/L and unlabored GI: BS positive and NT Extremity:  Pulses positive and No Edema Skin:   Intact and Wound C/D/I and groin, distal thigh Neuro: Alert/Oriented, Normal Sensory and Abnormal Motor 3-/5 L KE, 2- L HF, 3/5 L ankle DF/PF, 5/5 in BUE and RLE Musc/Skel:  Swelling Left thigh Gen NAD   Assessment/Plan:  1. Functional deficits secondary to Left femoral shaft fx which require 3+ hours per day of interdisciplinary therapy in a comprehensive inpatient rehab setting. Physiatrist is providing close team supervision and 24 hour management of active medical problems listed below. Physiatrist and rehab team continue to assess barriers to discharge/monitor patient progress toward functional and medical goals. Team conference today please see physician documentation under team conference tab, met with team face-to-face to discuss problems,progress, and goals. Formulized individual treatment plan based on medical history, underlying problem and comorbidities. FIM: FIM - Bathing Bathing Steps Patient Completed: Chest;Right Arm;Left Arm;Abdomen;Right upper leg;Left upper leg Bathing: 3: Mod-Patient completes 5-7 81f 10 parts or 50-74%  FIM - Upper Body  Dressing/Undressing Upper body dressing/undressing steps patient completed: Thread/unthread right bra strap;Thread/unthread left bra strap;Hook/unhook bra;Thread/unthread right sleeve of pullover shirt/dresss;Thread/unthread left sleeve of pullover shirt/dress;Put head through opening of pull over shirt/dress;Pull shirt over trunk Upper body dressing/undressing: 5: Set-up assist to: Obtain clothing/put away FIM - Lower Body Dressing/Undressing Lower body dressing/undressing: 1: Total-Patient completed less than 25% of tasks     FIM - Diplomatic Services operational officer Devices: Building control surveyor Transfers: 2-From toilet/BSC: Max A (lift and lower assist);2-To toilet/BSC: Max A (lift and lower assist)  FIM - Press photographer Assistive Devices: Bed rails;Arm rests;Walker Bed/Chair Transfer: 2: Chair or W/C > Bed: Max A (lift and lower assist);2: Bed > Chair or W/C: Max A (lift and lower assist);2: Sit > Supine: Max A (lifting assist/Pt. 25-49%);2: Supine > Sit: Max A (lifting assist/Pt. 25-49%)  FIM - Locomotion: Wheelchair Distance: 130 Locomotion: Wheelchair: 2: Travels 50 - 149 ft with supervision, cueing or coaxing FIM - Locomotion: Ambulation Locomotion: Ambulation Assistive Devices: Designer, industrial/product Ambulation/Gait Assistance: 2: Max assist Locomotion: Ambulation: 1: Travels less than 50 ft with maximal assistance (Pt: 25 - 49%)  Comprehension Comprehension Mode: Auditory Comprehension: 5-Understands basic 90% of the time/requires cueing < 10% of the time  Expression Expression Mode: Verbal Expression: 5-Expresses basic needs/ideas: With no assist  Social Interaction Social Interaction: 4-Interacts appropriately 75 - 89% of the time - Needs redirection for appropriate language or to initiate interaction.  Problem Solving Problem Solving: 4-Solves basic 75 - 89% of the time/requires cueing 10 - 24% of the time  Memory Memory:  5-Recognizes or recalls 90% of the time/requires cueing < 10% of the time  Medical Problem List and Plan:  1. DVT Prophylaxis/Anticoagulation: Pharmaceutical: Lovenox  2. Pain Management: prn medications ineffective at noc titrate long acting, per OT pain is not interfering with therapy 3. 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 Will have LCSW follow for evaluation.  4. Neuropsych: This patient is capable of making decisions on her own behalf.  5. DM type 2: Will monitor BS with ac/hs checks. Resume Amaryl 4 mg bid--anticipate BS to run high with stress. Will use SSI and meal coverage to keep BS controlled. Wean off levemir as Hgb A1c (7.6) --indicates reasonable control  6. Hypothyroid: Resume supplement.  7. Leucocytosis: Likely reactive. Will recheck in am. Will check UA/UCS as foley has been in place for retention.  8. Urinary retention: Will discontinue foley and start voiding trial. Get patient to [facility] to void.  9. Hypoxia: Encourage IS--limit supplemental oxygen as Hypercarbia noted.  10 Tachycardia: likely due to deconditioning. Monitor heart rate with bid checks. Monitor for symptoms with increase in activity.    LOS (Days) 2 A FACE TO FACE EVALUATION WAS PERFORMED  KIRSTEINS,ANDREW E 10/11/2013, 8:11 AM

## 2013-10-12 ENCOUNTER — Inpatient Hospital Stay (HOSPITAL_COMMUNITY): Payer: Medicaid Other

## 2013-10-12 ENCOUNTER — Inpatient Hospital Stay (HOSPITAL_COMMUNITY): Payer: Medicaid Other | Admitting: Physical Therapy

## 2013-10-12 LAB — GLUCOSE, CAPILLARY: Glucose-Capillary: 111 mg/dL — ABNORMAL HIGH (ref 70–99)

## 2013-10-12 NOTE — Progress Notes (Signed)
Occupational Therapy Session Note  Patient Details  Name: ABIOLA BEHRING MRN: [id number] Date of Birth: 1980-11-17  Today's Date: 10/12/2013 Time: 1191-4782 Time Calculation (min): 75 min  Short Term Goals: Week 1:  OT Short Term Goal 1 (Week 1): Focus on LTGs seondary to short ELOS  Skilled Therapeutic Interventions/Progress Updates:    Pt seen for ADL retraining with focus on activity tolerance, functional transfers, standing balance, and standing tolerance. Pt received sitting in w/c and agreeable to shower with min encouragement. Completed stand pivot transfer into walk-in shower with mod assist for sit<>stand then min assist during pivoting. Requiring increased time for transfers secondary to fear and fatigue. Completed bathing using LH for lower BLE and pt declining to assist with buttocks. Completed dressing from w/c level using AE (reacher and sock-aid) with increased time. Pt very motivated that she was able to thread BLE. Required mod assist for sit>stand and min-steadying assist for clothing management around waist. Pt very fearful of falling and assisted minimally with management of pants. Pt required frequent rest breaks throughout therapy session and became tearful on one occasion secondary to fear of pain and falling. Pt left sitting in w/c with mother present and all needs in reach.   Therapy Documentation Precautions:  Precautions Precautions: Fall Restrictions Weight Bearing Restrictions: Yes LLE Weight Bearing: Partial weight bearing LLE Partial Weight Bearing Percentage or Pounds: 50% General:   Vital Signs:   Pain: Pt reporting 8/10 pain in LLE upon arrival however did not appear to be in pain during therapy session.   See FIM for current functional status  Therapy/Group: Individual Therapy  Daneil Dan 10/12/2013, 12:02 PM

## 2013-10-12 NOTE — Progress Notes (Signed)
Pt. Refused cpap. 

## 2013-10-12 NOTE — Progress Notes (Signed)
Patient states "my hands are swollen and tight" and "they are numb, achy, and hurt."  No edema noted upon assessment.  Glennie Hawk, PA notified of patient complaints; no new orders received.  Will continue to monitor.

## 2013-10-12 NOTE — Progress Notes (Signed)
Physical Therapy Session Note  Patient Details  Name: Alexandra Kelley MRN: [id number] Date of Birth: [birth year]  Today's Date: 10/12/2013 Time: 1005-1109  Time Calculation (min): 64 min  Short Term Goals: Week 1:  PT Short Term Goal 1 (Week 1): Pt to perform supine<>sit transfer with consistent min A with HOB flat. PT Short Term Goal 2 (Week 1): Pt to perform bed<>chair transfer with consistent min A, min cueing to adhere to LLE PWB. PT Short Term Goal 3 (Week 1): Pt to ambulate 25' with LRAD requiring min A, min cueing to adhere to LLE PWB. PT Short Term Goal 4 (Week 1): Pt to perform self-propulsion of w/c x150' without rest break for functional endurance. PT Short Term Goal 5 (Week 1): Pt to negotiate single step without rails using LRAD with mod A.  Skilled Therapeutic Interventions/Progress Updates:    Treatment Session 1: Pt received seated in w/c with mother present; pt in tears due to LLE pain but agreeable to therapy. RN notified; present to administer medication shortly thereafter. Session focusd on addressing LLE pain, increased pt independence with gait, and initiation of car transfers. W/c mobility x150' with supervision; mod encouragement for pt to continue beyond 130', where pt typically stops to rest. Gait x8' with mod A, max encouragement, increased time. Gait x1 step with RW, mod A prior to pt request to be repositioned secondary to LLE pain.  Applied ice pack to LLE (distal femur) x22 minutes; worn concurrently with session. Stand-pivot form w/c<>car with rolling walker, mod A. Therapist departed with pt seated in w/c with LLE elevated, all needs within reach, mother present, and pt in no apparent distress.  Treatment Session 2: Pt received seated in w/c; agreeable to therapy session. Pt c/o ongoing LLE pain but calmly sitting in w/c, smiling. Session focused on increasing pt independence with dynamic standing balance, gait, and stair negotiation. W/c mobility x150' in  controlled environment with bilat UE's, supervision, cueing when negotiating door sills. Stand-pivot from w/c>mat table with rolling walker with min A. See below for detailed description of dynamic standing balance activities. Gait x13' in controlled environment with rolling walker and step-to pattern, min A and max encouragement. Gait trial limited by pt fatigue. No cueing required for pt to adhere to WB restrictions. After seated rest break x4 minutes, negotiation of single 4.5" step with bilat rails, step-to pattern, max A, mod encouragement, and increased time.  Negotiation of standard ramp with bilat rails, step-to pattern, max A. After seated rest break x3 minutes, returned to pt room via w/c mobility x150' in controlled environment with bilat UE's, supervision. No c/o pain throughout session; however, cold pack was applied to L distal femur, per pt request, during final 20 minutes of session (concurrent with negotiation of stairs, ramp, w/c mobility). Therapist departed pt room with pt seated in w/c with all needs met.  Therapy Documentation Precautions:  Precautions Precautions: Fall Restrictions Weight Bearing Restrictions: Yes LLE Weight Bearing: Partial weight bearing LLE Partial Weight Bearing Percentage or Pounds: 50% Pain: Pain Assessment Pain Assessment: 0-10 Pain Score: 7  Pain Type: Surgical pain Pain Location: Leg Pain Orientation: Left Pain Descriptors / Indicators: Aching;Constant Pain Frequency: Constant Pain Onset: On-going Patients Stated Pain Goal: 3 Pain Intervention(s): Medication (See eMAR);Repositioned;Emotional support Multiple Pain Sites: No Locomotion : Ambulation Ambulation/Gait Assistance: 3: Mod assist Wheelchair Mobility Distance: 150   See FIM for current functional status  Therapy/Group: Individual Therapy  Jahmad Petrich, Lorenda Ishihara 10/12/2013, 12:54 PM

## 2013-10-12 NOTE — Progress Notes (Signed)
Subjective/Complaints: Slept better with MS Contin 30mg  Review of Systems - Negative except Left thigh pain Objective: Vital Signs: Blood pressure 103/67, pulse 107, temperature 98.3 F (36.8 C), temperature source Oral, resp. rate 16, height 5\' 3"  (1.6 m), weight 150.141 kg (331 lb), SpO2 92.00%. No results found. Results for orders placed during the hospital encounter of 10/09/13 (from the past 72 hour(s))  GLUCOSE, CAPILLARY     Status: Abnormal   Collection Time    10/09/13  4:52 PM      Result Value Range   Glucose-Capillary 224 (*) 70 - 99 mg/dL   Comment 1 Notify RN    URINALYSIS, ROUTINE W REFLEX MICROSCOPIC     Status: Abnormal   Collection Time    10/09/13  6:40 PM      Result Value Range   Color, Urine YELLOW  YELLOW   APPearance CLEAR  CLEAR   Specific Gravity, Urine 1.012  1.005 - 1.030   pH 6.5  5.0 - 8.0   Glucose, UA NEGATIVE  NEGATIVE mg/dL   Hgb urine dipstick NEGATIVE  NEGATIVE   Bilirubin Urine NEGATIVE  NEGATIVE   Ketones, ur NEGATIVE  NEGATIVE mg/dL   Protein, ur NEGATIVE  NEGATIVE mg/dL   Urobilinogen, UA 0.2  0.0 - 1.0 mg/dL   Nitrite NEGATIVE  NEGATIVE   Leukocytes, UA TRACE (*) NEGATIVE  URINE CULTURE     Status: None   Collection Time    10/09/13  6:40 PM      Result Value Range   Specimen Description URINE, CLEAN CATCH     Special Requests NONE     Culture  Setup Time       Value: 10/10/2013 02:21     Performed at [facility] Count       Value: NO GROWTH     Performed at [facility]   Culture       Value: NO GROWTH     Performed at [facility]   Report Status 10/11/2013 FINAL    URINE MICROSCOPIC-ADD ON     Status: Abnormal   Collection Time    10/09/13  6:40 PM      Result Value Range   Squamous Epithelial / LPF RARE  RARE   WBC, UA 3-6  <3 WBC/hpf   Bacteria, UA FEW (*) RARE  GLUCOSE, CAPILLARY     Status: Abnormal   Collection Time    10/09/13  8:59 PM      Result Value Range    Glucose-Capillary 296 (*) 70 - 99 mg/dL   Comment 1 Notify RN    CBC WITH DIFFERENTIAL     Status: Abnormal   Collection Time    10/10/13  5:50 AM      Result Value Range   WBC 13.9 (*) 4.0 - 10.5 K/uL   RBC 3.81 (*) 3.87 - 5.11 MIL/uL   Hemoglobin 11.2 (*) 12.0 - 15.0 g/dL   HCT 16.1 (*) 09.6 - 04.5 %   MCV 91.1  78.0 - 100.0 fL   MCH 29.4  26.0 - 34.0 pg   MCHC 32.3  30.0 - 36.0 g/dL   RDW 40.9  81.1 - 91.4 %   Platelets 316  150 - 400 K/uL   Neutrophils Relative % 68  43 - 77 %   Neutro Abs 9.4 (*) 1.7 - 7.7 K/uL   Lymphocytes Relative 23  12 - 46 %   Lymphs Abs 3.2  0.7 - 4.0 K/uL   Monocytes Relative 5  3 - 12 %   Monocytes Absolute 0.7  0.1 - 1.0 K/uL   Eosinophils Relative 3  0 - 5 %   Eosinophils Absolute 0.5  0.0 - 0.7 K/uL   Basophils Relative 1  0 - 1 %   Basophils Absolute 0.1  0.0 - 0.1 K/uL  COMPREHENSIVE METABOLIC PANEL     Status: Abnormal   Collection Time    10/10/13  5:50 AM      Result Value Range   Sodium 138  135 - 145 mEq/L   Potassium 3.5  3.5 - 5.1 mEq/L   Chloride 94 (*) 96 - 112 mEq/L   CO2 34 (*) 19 - 32 mEq/L   Glucose, Bld 110 (*) 70 - 99 mg/dL   BUN 9  6 - 23 mg/dL   Creatinine, Ser 2.95  0.50 - 1.10 mg/dL   Calcium 9.4  8.4 - 62.1 mg/dL   Total Protein 6.8  6.0 - 8.3 g/dL   Albumin 2.9 (*) 3.5 - 5.2 g/dL   AST 54 (*) 0 - 37 U/L   ALT 53 (*) 0 - 35 U/L   Alkaline Phosphatase 57  39 - 117 U/L   Total Bilirubin 0.5  0.3 - 1.2 mg/dL   GFR calc non Af Amer >90  >90 mL/min   GFR calc Af Amer >90  >90 mL/min   Comment: (NOTE)     The eGFR has been calculated using the CKD EPI equation.     This calculation has not been validated in all clinical situations.     eGFR's persistently <90 mL/min signify possible Chronic Kidney     Disease.  GLUCOSE, CAPILLARY     Status: Abnormal   Collection Time    10/10/13  7:35 AM      Result Value Range   Glucose-Capillary 116 (*) 70 - 99 mg/dL   Comment 1 Notify RN    GLUCOSE, CAPILLARY     Status:  Abnormal   Collection Time    10/10/13 10:59 AM      Result Value Range   Glucose-Capillary 199 (*) 70 - 99 mg/dL   Comment 1 Notify RN    GLUCOSE, CAPILLARY     Status: Abnormal   Collection Time    10/10/13  4:40 PM      Result Value Range   Glucose-Capillary 133 (*) 70 - 99 mg/dL   Comment 1 Notify RN    GLUCOSE, CAPILLARY     Status: Abnormal   Collection Time    10/10/13  8:49 PM      Result Value Range   Glucose-Capillary 151 (*) 70 - 99 mg/dL   Comment 1 Notify RN    GLUCOSE, CAPILLARY     Status: Abnormal   Collection Time    10/11/13  7:27 AM      Result Value Range   Glucose-Capillary 147 (*) 70 - 99 mg/dL   Comment 1 Notify RN    GLUCOSE, CAPILLARY     Status: Abnormal   Collection Time    10/11/13 12:07 PM      Result Value Range   Glucose-Capillary 170 (*) 70 - 99 mg/dL   Comment 1 Notify RN    GLUCOSE, CAPILLARY     Status: Abnormal   Collection Time    10/11/13  4:42 PM      Result Value Range   Glucose-Capillary 151 (*) 70 - 99 mg/dL  Comment 1 Notify RN    GLUCOSE, CAPILLARY     Status: Abnormal   Collection Time    10/11/13  8:37 PM      Result Value Range   Glucose-Capillary 151 (*) 70 - 99 mg/dL   Comment 1 Notify RN    GLUCOSE, CAPILLARY     Status: Abnormal   Collection Time    10/12/13  7:30 AM      Result Value Range   Glucose-Capillary 143 (*) 70 - 99 mg/dL   Comment 1 Notify RN       HEENT: normal Cardio: RRR and no murmur Resp: CTA B/L and unlabored GI: BS positive and NT Extremity:  Pulses positive and No Edema Skin:   Intact and Wound C/D/I and groin, distal thigh Neuro: Alert/Oriented, Normal Sensory and Abnormal Motor 3-/5 L KE, 2- L HF, 3/5 L ankle DF/PF, 5/5 in BUE and RLE Musc/Skel:  Swelling Left thigh Gen NAD   Assessment/Plan: 1. Functional deficits secondary to Left femoral shaft fx which require 3+ hours per day of interdisciplinary therapy in a comprehensive inpatient rehab setting. Physiatrist is providing close  team supervision and 24 hour management of active medical problems listed below. Physiatrist and rehab team continue to assess barriers to discharge/monitor patient progress toward functional and medical goals.  FIM: FIM - Bathing Bathing Steps Patient Completed: Chest;Right Arm;Left Arm;Abdomen;Right upper leg;Left upper leg;Left lower leg (including foot);Right lower leg (including foot) Bathing: 4: Min-Patient completes 8-9 59f 10 parts or 75+ percent  FIM - Upper Body Dressing/Undressing Upper body dressing/undressing steps patient completed: Thread/unthread right bra strap;Thread/unthread left bra strap;Hook/unhook bra;Thread/unthread right sleeve of pullover shirt/dresss;Thread/unthread left sleeve of pullover shirt/dress;Put head through opening of pull over shirt/dress;Pull shirt over trunk Upper body dressing/undressing: 5: Set-up assist to: Obtain clothing/put away FIM - Lower Body Dressing/Undressing Lower body dressing/undressing steps patient completed: Thread/unthread right underwear leg;Thread/unthread left underwear leg Lower body dressing/undressing: 2: Max-Patient completed 25-49% of tasks  FIM - Toileting Toileting steps completed by patient: Adjust clothing prior to toileting Toileting: 2: Max-Patient completed 1 of 3 steps  FIM - Diplomatic Services operational officer Devices: Building control surveyor Transfers: 3-To toilet/BSC: Mod A (lift or lower assist);3-From toilet/BSC: Mod A (lift or lower assist)  FIM - Bed/Chair Transfer Bed/Chair Transfer Assistive Devices: Bed rails;Arm rests;Walker Bed/Chair Transfer: 3: Supine > Sit: Mod A (lifting assist/Pt. 50-74%/lift 2 legs;4: Sit > Supine: Min A (steadying pt. > 75%/lift 1 leg);4: Bed > Chair or W/C: Min A (steadying Pt. > 75%);4: Chair or W/C > Bed: Min A (steadying Pt. > 75%)  FIM - Locomotion: Wheelchair Distance: 130 Locomotion: Wheelchair: 2: Travels 50 - 149 ft with supervision, cueing or coaxing FIM  - Locomotion: Ambulation Locomotion: Ambulation Assistive Devices: Designer, industrial/product Ambulation/Gait Assistance: 3: Mod assist Locomotion: Ambulation: 1: Travels less than 50 ft with moderate assistance (Pt: 50 - 74%)  Comprehension Comprehension Mode: Auditory Comprehension: 5-Understands basic 90% of the time/requires cueing < 10% of the time  Expression Expression Mode: Verbal Expression: 5-Expresses basic needs/ideas: With no assist  Social Interaction Social Interaction: 4-Interacts appropriately 75 - 89% of the time - Needs redirection for appropriate language or to initiate interaction.  Problem Solving Problem Solving: 4-Solves basic 75 - 89% of the time/requires cueing 10 - 24% of the time  Memory Memory: 5-Recognizes or recalls 90% of the time/requires cueing < 10% of the time  Medical Problem List and Plan:  1. DVT Prophylaxis/Anticoagulation: Pharmaceutical: Lovenox  2.  Pain Management: prn medications MS Contin 30mg  qhs long , per OT pain is not interfering with therapy 3. 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 Will have LCSW follow for evaluation.  4. Neuropsych: This patient is capable of making decisions on her own behalf.  5. DM type 2: Will monitor BS with ac/hs checks. Resume Amaryl 4 mg bid--anticipate BS to run high with stress. Will use SSI and meal coverage to keep BS controlled. Wean off levemir as Hgb A1c (7.6) --indicates reasonable control  6. Hypothyroid: Resume supplement.  7. Leucocytosis: Likely reactive.  8. Urinary retention: Will discontinue foley and start voiding trial. Get patient to [facility] to void.  9. Hypoxia: Encourage IS--limit supplemental oxygen as Hypercarbia noted.  10 Tachycardia: likely due to deconditioning. Monitor heart rate with bid checks. Monitor for symptoms with increase in activity.    LOS (Days) 3 A FACE TO FACE EVALUATION WAS PERFORMED  Amandeep Nesmith E 10/12/2013, 8:37 AM

## 2013-10-13 ENCOUNTER — Inpatient Hospital Stay (HOSPITAL_COMMUNITY): Payer: Medicaid Other | Admitting: Physical Therapy

## 2013-10-13 ENCOUNTER — Inpatient Hospital Stay (HOSPITAL_COMMUNITY): Payer: Medicaid Other

## 2013-10-13 ENCOUNTER — Inpatient Hospital Stay (HOSPITAL_COMMUNITY): Payer: Medicaid Other | Admitting: *Deleted

## 2013-10-13 LAB — GLUCOSE, CAPILLARY
Glucose-Capillary: 137 mg/dL — ABNORMAL HIGH (ref 70–99)
Glucose-Capillary: 121 mg/dL — ABNORMAL HIGH (ref 70–99)

## 2013-10-13 MED ORDER — METHOCARBAMOL 500 MG PO TABS
1000.0000 mg | ORAL_TABLET | Freq: Four times a day (QID) | ORAL | Status: DC
  Administered 2013-10-13 – 2013-10-20 (×29): 1000 mg via ORAL
  Filled 2013-10-13 (×40): qty 2

## 2013-10-13 MED ORDER — HYDROCODONE-ACETAMINOPHEN 5-325 MG PO TABS
2.0000 | ORAL_TABLET | Freq: Two times a day (BID) | ORAL | Status: DC
  Administered 2013-10-13 – 2013-10-15 (×4): 2 via ORAL
  Administered 2013-10-15: 1 via ORAL
  Filled 2013-10-13 (×3): qty 2

## 2013-10-13 MED ORDER — HYDROCODONE-ACETAMINOPHEN 5-325 MG PO TABS
2.0000 | ORAL_TABLET | ORAL | Status: DC | PRN
  Administered 2013-10-13 – 2013-10-14 (×5): 2 via ORAL
  Administered 2013-10-15: 1 via ORAL
  Filled 2013-10-13 (×7): qty 2

## 2013-10-13 NOTE — Progress Notes (Signed)
Occupational Therapy Session Note  Patient Details  Name: Alexandra Kelley MRN: [id number] Date of Birth: Jun 06, 1981  Today's Date: 10/13/2013 Time: 7829-5621 and 1305-1400 Time Calculation (min): 75 min and 55 min   Short Term Goals: Week 1:  OT Short Term Goal 1 (Week 1): Focus on LTGs seondary to short ELOS  Skilled Therapeutic Interventions/Progress Updates:    Session 1: Pt seen for ADL retraining with focus on activity tolerance, sit<>stand, standing balance, and pain management. Pt received sitting in w/c with mother present and requesting to complete bathing at sink. Pt tearful on 2 occasions during therapy session d/t pain and therapist asking pt to stand. Pt is fearful of standing d/t pain often leading to brief tearful moment. Required min assist for sit<>stand and standing balance during LB dressing and bathing. Pt required mod encouragement for clothing management around waist secondary to fear of falling and pain and pt very excited after succeeding at this task. Utilized reacher and sock-aid for LB dressing with increased time and min cues. At end of session pt left with ice pack applied to LLE and all needs in reach. RN called to bring more pain meds for pt.   Session 2: Therapy session focused on UE strengthening, activity tolerance, and functional transfers. Pt received sitting on BSC. Completed sit>stand from [facility] with min assist and min guard when managing clothing around waist. Completed stand pivot transfer to w/c with min assist using RW. Pt ambulated short distance (approx 8 feet) from room to commode with min assist and increased time. Pt propelled self in w/c from room to/from ADL apartment for activity tolerance requiring 1 rest break going each direction. Practiced tub transfer using TTB. Required min assist for managing LLE into and out of tub. Completed BUE strengthening HEP with pt using Level 2 theraband. Provided handout of exercises and short notes to increases ease  of carryover of exercises. Required max cues for first set of exercises and progressed to mod cues after therapist provided short written cues on handout. Pt left sitting in w/c with all needs in reach.  Therapy Documentation Precautions:  Precautions Precautions: Fall Restrictions Weight Bearing Restrictions: Yes LLE Weight Bearing: Partial weight bearing LLE Partial Weight Bearing Percentage or Pounds: 50% General:   Vital Signs:   Pain: Session 1: Pt reporting 10/10 pain upon arrival however participated well throughout session and RN notified. Session 2: Pt reporting 5/10 pain at LLE.   See FIM for current functional status  Therapy/Group: Individual Therapy  Daneil Dan 10/13/2013, 12:07 PM

## 2013-10-13 NOTE — Progress Notes (Signed)
Physical Therapy Session Note  Patient Details  Name: Alexandra Kelley MRN: [id number] Date of Birth: 24-Nov-1980  Today's Date: 10/13/2013 Time: 1005-1105 Time Calculation (min): 60 min  Short Term Goals: Week 1:  PT Short Term Goal 1 (Week 1): Pt to perform supine<>sit transfer with consistent min A with HOB flat. PT Short Term Goal 2 (Week 1): Pt to perform bed<>chair transfer with consistent min A, min cueing to adhere to LLE PWB. PT Short Term Goal 3 (Week 1): Pt to ambulate 25' with LRAD requiring min A, min cueing to adhere to LLE PWB. PT Short Term Goal 4 (Week 1): Pt to perform self-propulsion of w/c x150' without rest break for functional endurance. PT Short Term Goal 5 (Week 1): Pt to negotiate single step without rails using LRAD with mod A.  Skilled Therapeutic Interventions/Progress Updates:    Pt received seated in w/c with mother present; agreeable to session. No mention of pain throughout session. Session focused on increasing pt stability/independence with w/c mobility, stair negotiation, and gait. Pt performed w/c mobility with bilat UE's x160', in controlled environment with supervision. Negotiation of 3 (3.5") steps with bilat hand rails, step-to pattern with max encouragement, increased time. After seated rest break, pt performed gait x20' with rolling walker requiring min guard, max encouragement, and increased time. Gait distance limited by fatigue. Pt managed w/c brakes with supervision, cueing and w/c leg rests with min A for HOH. Self-propulsion of w//c x180' in controlled environment to return to room, supervision for negotiating door frames. Therapist departed with pt seated in w/c with mother present and all needs within reach.  Therapy Documentation Precautions:  Precautions Precautions: Fall Restrictions Weight Bearing Restrictions: Yes LLE Weight Bearing: Partial weight bearing LLE Partial Weight Bearing Percentage or Pounds: 50% Pain: Pain  Assessment Pain Score: 0-No pain Locomotion : Ambulation Ambulation/Gait Assistance: 4: Min guard Wheelchair Mobility Distance: 150   See FIM for current functional status  Therapy/Group: Individual Therapy  Hobble, Lorenda Ishihara 10/13/2013, 3:28 PM

## 2013-10-13 NOTE — Progress Notes (Signed)
Subjective/Complaints: Slept poorly, muscle spasms Review of Systems - Negative except Left thigh pain Objective: Vital Signs: Blood pressure 114/55, pulse 100, temperature 98.3 F (36.8 C), temperature source Oral, resp. rate 20, height 5\' 3"  (1.6 m), weight 150.141 kg (331 lb), SpO2 96.00%. No results found. Results for orders placed during the hospital encounter of 10/09/13 (from the past 72 hour(s))  GLUCOSE, CAPILLARY     Status: Abnormal   Collection Time    10/10/13 10:59 AM      Result Value Range   Glucose-Capillary 199 (*) 70 - 99 mg/dL   Comment 1 Notify RN    GLUCOSE, CAPILLARY     Status: Abnormal   Collection Time    10/10/13  4:40 PM      Result Value Range   Glucose-Capillary 133 (*) 70 - 99 mg/dL   Comment 1 Notify RN    GLUCOSE, CAPILLARY     Status: Abnormal   Collection Time    10/10/13  8:49 PM      Result Value Range   Glucose-Capillary 151 (*) 70 - 99 mg/dL   Comment 1 Notify RN    GLUCOSE, CAPILLARY     Status: Abnormal   Collection Time    10/11/13  7:27 AM      Result Value Range   Glucose-Capillary 147 (*) 70 - 99 mg/dL   Comment 1 Notify RN    GLUCOSE, CAPILLARY     Status: Abnormal   Collection Time    10/11/13 12:07 PM      Result Value Range   Glucose-Capillary 170 (*) 70 - 99 mg/dL   Comment 1 Notify RN    GLUCOSE, CAPILLARY     Status: Abnormal   Collection Time    10/11/13  4:42 PM      Result Value Range   Glucose-Capillary 151 (*) 70 - 99 mg/dL   Comment 1 Notify RN    GLUCOSE, CAPILLARY     Status: Abnormal   Collection Time    10/11/13  8:37 PM      Result Value Range   Glucose-Capillary 151 (*) 70 - 99 mg/dL   Comment 1 Notify RN    GLUCOSE, CAPILLARY     Status: Abnormal   Collection Time    10/12/13  7:30 AM      Result Value Range   Glucose-Capillary 143 (*) 70 - 99 mg/dL   Comment 1 Notify RN    GLUCOSE, CAPILLARY     Status: Abnormal   Collection Time    10/12/13 11:21 AM      Result Value Range   Glucose-Capillary 144 (*) 70 - 99 mg/dL   Comment 1 Notify RN    GLUCOSE, CAPILLARY     Status: Abnormal   Collection Time    10/12/13  4:46 PM      Result Value Range   Glucose-Capillary 111 (*) 70 - 99 mg/dL   Comment 1 Notify RN    GLUCOSE, CAPILLARY     Status: Abnormal   Collection Time    10/12/13  8:50 PM      Result Value Range   Glucose-Capillary 152 (*) 70 - 99 mg/dL   Comment 1 Notify RN    GLUCOSE, CAPILLARY     Status: Abnormal   Collection Time    10/13/13  7:38 AM      Result Value Range   Glucose-Capillary 108 (*) 70 - 99 mg/dL   Comment 1 Notify RN  HEENT: normal Cardio: RRR and no murmur Resp: CTA B/L and unlabored GI: BS positive and NT Extremity:  Pulses positive and No Edema Skin:   Intact and Wound C/D/I and groin, distal thigh Neuro: Alert/Oriented, Normal Sensory and Abnormal Motor 3-/5 L KE, 2- L HF, 3/5 L ankle DF/PF, 5/5 in BUE and RLE Musc/Skel:  Swelling Left thigh Gen NAD   Assessment/Plan: 1. Functional deficits secondary to Left femoral shaft fx which require 3+ hours per day of interdisciplinary therapy in a comprehensive inpatient rehab setting. Physiatrist is providing close team supervision and 24 hour management of active medical problems listed below. Physiatrist and rehab team continue to assess barriers to discharge/monitor patient progress toward functional and medical goals.  FIM: FIM - Bathing Bathing Steps Patient Completed: Chest;Right Arm;Left Arm;Abdomen;Right upper leg;Left upper leg;Left lower leg (including foot);Right lower leg (including foot) Bathing: 4: Min-Patient completes 8-9 85f 10 parts or 75+ percent  FIM - Upper Body Dressing/Undressing Upper body dressing/undressing steps patient completed: Thread/unthread right bra strap;Thread/unthread left bra strap;Hook/unhook bra;Thread/unthread right sleeve of pullover shirt/dresss;Thread/unthread left sleeve of pullover shirt/dress;Put head through opening of pull  over shirt/dress;Pull shirt over trunk Upper body dressing/undressing: 5: Set-up assist to: Obtain clothing/put away FIM - Lower Body Dressing/Undressing Lower body dressing/undressing steps patient completed: Thread/unthread right underwear leg;Thread/unthread left underwear leg;Thread/unthread right pants leg;Thread/unthread left pants leg;Don/Doff right sock;Don/Doff left sock Lower body dressing/undressing: 4: Min-Patient completed 75 plus % of tasks  FIM - Toileting Toileting steps completed by patient: Adjust clothing prior to toileting Toileting: 2: Max-Patient completed 1 of 3 steps  FIM - Diplomatic Services operational officer Devices: Building control surveyor Transfers: 3-To toilet/BSC: Mod A (lift or lower assist);3-From toilet/BSC: Mod A (lift or lower assist)  FIM - Bed/Chair Transfer Bed/Chair Transfer Assistive Devices: Arm rests;Walker Bed/Chair Transfer: 4: Bed > Chair or W/C: Min A (steadying Pt. > 75%);4: Chair or W/C > Bed: Min A (steadying Pt. > 75%)  FIM - Locomotion: Wheelchair Distance: 150 Locomotion: Wheelchair: 5: Travels 150 ft or more: maneuvers on rugs and over door sills with supervision, cueing or coaxing FIM - Locomotion: Ambulation Locomotion: Ambulation Assistive Devices: Designer, industrial/product Ambulation/Gait Assistance: 4: Min assist Locomotion: Ambulation: 1: Travels less than 50 ft with minimal assistance (Pt.>75%)  Comprehension Comprehension Mode: Auditory Comprehension: 5-Understands basic 90% of the time/requires cueing < 10% of the time  Expression Expression Mode: Verbal Expression: 5-Expresses basic 90% of the time/requires cueing < 10% of the time.  Social Interaction Social Interaction: 4-Interacts appropriately 75 - 89% of the time - Needs redirection for appropriate language or to initiate interaction.  Problem Solving Problem Solving: 4-Solves basic 75 - 89% of the time/requires cueing 10 - 24% of the time  Memory Memory:  4-Recognizes or recalls 75 - 89% of the time/requires cueing 10 - 24% of the time  Medical Problem List and Plan:  1. DVT Prophylaxis/Anticoagulation: Pharmaceutical: Lovenox  2. Pain Management: prn medications MS Contin 30mg  qhs long , schedule muscle relaxant 3. 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 Will have LCSW follow for evaluation.  4. Neuropsych: This patient is capable of making decisions on her own behalf.  5. DM type 2: Will monitor BS with ac/hs checks. Resume Amaryl 4 mg bid--anticipate BS to run high with stress. Will use SSI and meal coverage to keep BS controlled. Wean off levemir as Hgb A1c (7.6) --indicates reasonable control  6. Hypothyroid: Resume supplement.  7. Leucocytosis: Likely reactive.  8. Urinary retention: Will  discontinue foley and start voiding trial. Get patient to [facility] to void.  9. Hypoxia: Encourage IS--limit supplemental oxygen as Hypercarbia noted.  10 Tachycardia: likely due to deconditioning. Monitor heart rate with bid checks. Monitor for symptoms with increase in activity.    LOS (Days) 4 A FACE TO FACE EVALUATION WAS PERFORMED  KIRSTEINS,ANDREW E 10/13/2013, 8:09 AM

## 2013-10-13 NOTE — Progress Notes (Signed)
Recreational Therapy Assessment and Plan  Patient Details  Name: Alexandra Kelley MRN: [id number] Date of Birth: 1981/05/26 Today's Date: 10/13/2013  Rehab Potential: Good  Assessment Clinical Impression:Problem List:  Patient Active Problem List    Diagnosis  Date Noted   .  Femur fracture, left  10/09/2013   .  Acute urinary retention  10/04/2013   .  Acute-on-chronic respiratory failure  10/04/2013   .  Femur fracture  10/03/2013   .  Closed displaced oblique fracture of shaft of left femur  10/03/2013   .  Hypothyroidism  10/03/2013   .  Diabetes  10/03/2013    Past Medical History:  Past Medical History   Diagnosis  Date   .  Diabetes mellitus without complication      Dr. Lurene Shadow   .  Thyroid disease    .  GERD (gastroesophageal reflux disease)     Past Surgical History:  Past Surgical History   Procedure  Laterality  Date   .  Tubes in ears     .  Back surgery     .  Femur im nail  Left  10/03/2013     Procedure: INTRAMEDULLARY (IM) RETROGRADE FEMORAL NAILING; Surgeon: Eldred Manges, MD; Location: [facility] OR; Service: Orthopedics; Laterality: Left;    Assessment & Plan  Clinical Impression: Alexandra Kelley is a 32 y.o. female with history of DM, thyroid disease, morbid obesity, who was admitted on 10/03/13 past fall and subsequent closed displaced oblique fracture of shaft of left femur. Mechanical fall that occurred due to patient tripping when she got up from dinner table. Patient with hypoxia and CT chest negative for PE. She was evaluated by Dr. Ophelia Charter and underwent IM nailing for repair on the same day. Post op PWB and foley placed for urinary retention. Patient transferred to CIR on 10/09/2013.   Pt presents with decreased activity tolerance, decreased functional mobility, decreased balance, difficulty maintaining precautions, increased pain Limiting pt's independence with leisure/community pursuits.  Leisure History/Participation Premorbid leisure interest/current  participation: Games - Word-search;Community - Journalist, newspaper - Engineer, civil (consulting) Other Leisure Interests: Television;Cooking/Baking Leisure Participation Style: Alone;With Family/Friends Awareness of Community Resources: Good-identify 3 post discharge leisure resources Psychosocial / Spiritual Social interaction - Mood/Behavior: Cooperative Firefighter Appropriate for Education?: Yes Recreational Therapy Orientation Orientation -Reviewed with patient: Available activity resources Strengths/Weaknesses Patient Strengths/Abilities: Willingness to participate;Active premorbidly Patient weaknesses: Physical limitations;Minimal Premorbid Leisure Activity TR Patient demonstrates impairments in the following area(s): Endurance;Pain  Plan Rec Therapy Plan Is patient appropriate for Therapeutic Recreation?: Yes Rehab Potential: Good Treatment times per week: Min 1 time per week >20 minutes TR Treatment/Interventions: Adaptive equipment instruction;1:1 session;Balance/vestibular training;Functional mobility training;Community reintegration;Patient/family education;Therapeutic activities;Recreation/leisure participation;Provide activity resources in room;Wheelchair propulsion/positioning Discharge Criteria: Patient will be discharged from TR if patient refuses treatment 3 consecutive times without medical reason.  If treatment goals not met, if there is a change in medical status, if patient makes no progress towards goals or if patient is discharged from hospital.  The above assessment, treatment plan, treatment alternatives and goals were discussed and mutually agreed upon: by patient  Alexandra Kelley 10/13/2013, 8:26 AM

## 2013-10-14 ENCOUNTER — Inpatient Hospital Stay (HOSPITAL_COMMUNITY): Payer: Medicaid Other | Admitting: Physical Therapy

## 2013-10-14 ENCOUNTER — Inpatient Hospital Stay (HOSPITAL_COMMUNITY): Payer: Medicaid Other | Admitting: *Deleted

## 2013-10-14 LAB — GLUCOSE, CAPILLARY
Glucose-Capillary: 151 mg/dL — ABNORMAL HIGH (ref 70–99)
Glucose-Capillary: 159 mg/dL — ABNORMAL HIGH (ref 70–99)
Glucose-Capillary: 131 mg/dL — ABNORMAL HIGH (ref 70–99)

## 2013-10-14 NOTE — Progress Notes (Signed)
Subjective/Complaints: Slept fair, participating in therapy, pain this am Review of Systems - Negative except Left thigh pain Objective: Vital Signs: Blood pressure 118/75, pulse 102, temperature 98.4 F (36.9 C), temperature source Oral, resp. rate 18, height 5\' 3"  (1.6 m), weight 150.141 kg (331 lb), SpO2 95.00%. No results found. Results for orders placed during the hospital encounter of 10/09/13 (from the past 72 hour(s))  GLUCOSE, CAPILLARY     Status: Abnormal   Collection Time    10/11/13 12:07 PM      Result Value Range   Glucose-Capillary 170 (*) 70 - 99 mg/dL   Comment 1 Notify RN    GLUCOSE, CAPILLARY     Status: Abnormal   Collection Time    10/11/13  4:42 PM      Result Value Range   Glucose-Capillary 151 (*) 70 - 99 mg/dL   Comment 1 Notify RN    GLUCOSE, CAPILLARY     Status: Abnormal   Collection Time    10/11/13  8:37 PM      Result Value Range   Glucose-Capillary 151 (*) 70 - 99 mg/dL   Comment 1 Notify RN    GLUCOSE, CAPILLARY     Status: Abnormal   Collection Time    10/12/13  7:30 AM      Result Value Range   Glucose-Capillary 143 (*) 70 - 99 mg/dL   Comment 1 Notify RN    GLUCOSE, CAPILLARY     Status: Abnormal   Collection Time    10/12/13 11:21 AM      Result Value Range   Glucose-Capillary 144 (*) 70 - 99 mg/dL   Comment 1 Notify RN    GLUCOSE, CAPILLARY     Status: Abnormal   Collection Time    10/12/13  4:46 PM      Result Value Range   Glucose-Capillary 111 (*) 70 - 99 mg/dL   Comment 1 Notify RN    GLUCOSE, CAPILLARY     Status: Abnormal   Collection Time    10/12/13  8:50 PM      Result Value Range   Glucose-Capillary 152 (*) 70 - 99 mg/dL   Comment 1 Notify RN    GLUCOSE, CAPILLARY     Status: Abnormal   Collection Time    10/13/13  7:38 AM      Result Value Range   Glucose-Capillary 108 (*) 70 - 99 mg/dL   Comment 1 Notify RN    GLUCOSE, CAPILLARY     Status: Abnormal   Collection Time    10/13/13 11:31 AM      Result  Value Range   Glucose-Capillary 137 (*) 70 - 99 mg/dL  GLUCOSE, CAPILLARY     Status: Abnormal   Collection Time    10/13/13  4:22 PM      Result Value Range   Glucose-Capillary 121 (*) 70 - 99 mg/dL  GLUCOSE, CAPILLARY     Status: Abnormal   Collection Time    10/13/13  9:24 PM      Result Value Range   Glucose-Capillary 153 (*) 70 - 99 mg/dL   Comment 1 Notify RN    GLUCOSE, CAPILLARY     Status: Abnormal   Collection Time    10/14/13  7:43 AM      Result Value Range   Glucose-Capillary 151 (*) 70 - 99 mg/dL   Comment 1 Notify RN       HEENT: normal Cardio: RRR and no  murmur Resp: CTA B/L and unlabored GI: BS positive and NT Extremity:  Pulses positive and No Edema Skin:   Intact and Wound C/D/I and groin, distal thigh Neuro: Alert/Oriented, Normal Sensory and Abnormal Motor 3-/5 L KE, 2- L HF, 3/5 L ankle DF/PF, 5/5 in BUE and RLE Musc/Skel:  Swelling Left thigh Gen NAD   Assessment/Plan: 1. Functional deficits secondary to Left femoral shaft fx which require 3+ hours per day of interdisciplinary therapy in a comprehensive inpatient rehab setting. Physiatrist is providing close team supervision and 24 hour management of active medical problems listed below. Physiatrist and rehab team continue to assess barriers to discharge/monitor patient progress toward functional and medical goals.  FIM: FIM - Bathing Bathing Steps Patient Completed: Chest;Right Arm;Left Arm;Abdomen;Right upper leg;Left upper leg;Left lower leg (including foot);Right lower leg (including foot) Bathing: 4: Min-Patient completes 8-9 61f 10 parts or 75+ percent  FIM - Upper Body Dressing/Undressing Upper body dressing/undressing steps patient completed: Thread/unthread right bra strap;Thread/unthread left bra strap;Hook/unhook bra;Thread/unthread right sleeve of pullover shirt/dresss;Thread/unthread left sleeve of pullover shirt/dress;Put head through opening of pull over shirt/dress;Pull shirt over  trunk Upper body dressing/undressing: 5: Set-up assist to: Obtain clothing/put away FIM - Lower Body Dressing/Undressing Lower body dressing/undressing steps patient completed: Thread/unthread right underwear leg;Thread/unthread left underwear leg;Thread/unthread right pants leg;Thread/unthread left pants leg;Don/Doff right sock;Don/Doff left sock;Pull underwear up/down;Pull pants up/down Lower body dressing/undressing: 4: Steadying Assist  FIM - Toileting Toileting steps completed by patient: Adjust clothing after toileting Toileting: 2: Max-Patient completed 1 of 3 steps  FIM - Diplomatic Services operational officer Devices: Best boy Transfers: 4-To toilet/BSC: Min A (steadying Pt. > 75%);4-From toilet/BSC: Min A (steadying Pt. > 75%)  FIM - Bed/Chair Transfer Bed/Chair Transfer Assistive Devices: Arm rests;Walker Bed/Chair Transfer: 4: Bed > Chair or W/C: Min A (steadying Pt. > 75%);4: Chair or W/C > Bed: Min A (steadying Pt. > 75%)  FIM - Locomotion: Wheelchair Distance: 150 Locomotion: Wheelchair: 5: Travels 150 ft or more: maneuvers on rugs and over door sills with supervision, cueing or coaxing FIM - Locomotion: Ambulation Locomotion: Ambulation Assistive Devices: Designer, industrial/product Ambulation/Gait Assistance: 4: Min guard Locomotion: Ambulation: 1: Travels less than 50 ft with minimal assistance (Pt.>75%)  Comprehension Comprehension Mode: Auditory Comprehension: 5-Understands basic 90% of the time/requires cueing < 10% of the time  Expression Expression Mode: Verbal Expression: 5-Expresses basic 90% of the time/requires cueing < 10% of the time.  Social Interaction Social Interaction: 4-Interacts appropriately 75 - 89% of the time - Needs redirection for appropriate language or to initiate interaction.  Problem Solving Problem Solving: 4-Solves basic 75 - 89% of the time/requires cueing 10 - 24% of the time  Memory Memory: 4-Recognizes or recalls 75  - 89% of the time/requires cueing 10 - 24% of the time  Medical Problem List and Plan:  1. DVT Prophylaxis/Anticoagulation: Pharmaceutical: Lovenox  2. Pain Management: prn medications MS Contin 30mg  qhs long , schedule muscle relaxant 3. 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 Will have LCSW follow for evaluation.  4. Neuropsych: This patient is capable of making decisions on her own behalf.  5. DM type 2: Will monitor BS with ac/hs checks. Resume Amaryl 4 mg bid--anticipate BS to run high with stress. Will use SSI and meal coverage to keep BS controlled. Wean off levemir as Hgb A1c (7.6) --indicates reasonable control  6. Hypothyroid: Resume supplement.  7. Leucocytosis: Likely reactive.  8. Urinary retention: Will discontinue foley and start voiding trial. Get patient  to [facility] to void.  9. Hypoxia: improved 10 Tachycardia: likely due to deconditioning. Monitor heart rate with bid checks. Monitor for symptoms with increase in activity.    LOS (Days) 5 A FACE TO FACE EVALUATION WAS PERFORMED  Ronnell Makarewicz E 10/14/2013, 10:41 AM

## 2013-10-14 NOTE — Progress Notes (Signed)
Occupational Therapy Note  Patient Details  Name: Alexandra Kelley MRN: [id number] Date of Birth: 1981-10-18 Today's Date: 10/14/2013 Time:1000- 1100 1st session Pain:10/10 LEFT THIGH;  SEE MAR Individual session   1st session:  PT. Sitting in  wc upon OT arrival.  Addressed wc mobility, AE usage, .  Performed bathing at wc level with AE for LB.   Pain interfered with her functional activity with muscle spasms plus she did not sleep well last night.  Pt went from sit to stand with min assist and was able to balance to wash peri area with LH sponge.  Pt. Donned LB clothes with AE.  Went from sit to stand 3rd time with mod assist and min assist with standing balance.  Need minimal assist with pulling up pants.  Used sock aid to don socks with min assist.  Left pt in wc with call bell,phone within reach.   Mom in room with patient.    Time:1510-1540  2nd session Pain:10/10 Individual session   2nd session:  Addressed wc mobility, UE therapeutic exercises using scifit for 10 minutes at 3 wkld with one rest break at 5 minutes.   Did not do any sit to stand due to Left leg pain.  Pt propelled wc back to room and left with call bell in reach and Mom present.        Humberto Seals 10/14/2013, 7:59 AM

## 2013-10-14 NOTE — Progress Notes (Signed)
Pt. Stated that she still doesn't want to wear CPAP.

## 2013-10-14 NOTE — Progress Notes (Signed)
Physical Therapy Note  Patient Details  Name: Alexandra Kelley MRN: [id number] Date of Birth: Oct 05, 1981 Today's Date: 10/14/2013  4010-2725 (55 minutes) individual Pain: 5/10 left LE/ premedicated Focus of treatment: Therapeutic exercise focused on bilateral LE strengthening; gait training PWB left LE Treatment: pt up in wc upon arrival; wc mobility SBA on unit 150 + feet level surfaces X 2 ; wc setup for transfer SBA  Including legrests ; sit to stand transfer with RW min to close SBA with min vcs for hand placement; sit to supine min assist left LE; Therapeutic exercises X 20 in supine bilaterally- ankle pumps, heel slides, hip abduction (AA on left), SAQs, quad sets; supine to sit min assist left LE; sit to stand X 5 from edge of mat; gait 31 feet , 10 feet RW miin to close SBA PWB LT LE; UE ergonometer for activity tolerance X 5 minutes Level 1; mother present during session.  1345-1425 (40 minutes) individual Pain : pt reports 10/10 pain left LE / pain meds given prior to therapy session Treatment: Attempted stand/turn transfer with RW but pt reports increased pain left LE; UE ergonometer X 10 minutes for activity tolerance; standing to RW performing LT hip flexion 2 x 10 ; returned to room with ice pack applied to LT LE; mother present.   Earsel Shouse,JIM 10/14/2013, 9:23 AM

## 2013-10-15 ENCOUNTER — Inpatient Hospital Stay (HOSPITAL_COMMUNITY): Payer: Medicaid Other | Admitting: *Deleted

## 2013-10-15 LAB — GLUCOSE, CAPILLARY
Glucose-Capillary: 166 mg/dL — ABNORMAL HIGH (ref 70–99)
Glucose-Capillary: 140 mg/dL — ABNORMAL HIGH (ref 70–99)
Glucose-Capillary: 136 mg/dL — ABNORMAL HIGH (ref 70–99)

## 2013-10-15 MED ORDER — OXYCODONE HCL 5 MG PO TABS
5.0000 mg | ORAL_TABLET | ORAL | Status: DC | PRN
  Administered 2013-10-15 – 2013-10-20 (×22): 5 mg via ORAL
  Filled 2013-10-15 (×23): qty 1

## 2013-10-15 MED ORDER — OXYCODONE HCL ER 10 MG PO T12A
20.0000 mg | EXTENDED_RELEASE_TABLET | Freq: Two times a day (BID) | ORAL | Status: DC
  Administered 2013-10-15 – 2013-10-20 (×11): 20 mg via ORAL
  Filled 2013-10-15 (×11): qty 2

## 2013-10-15 NOTE — Progress Notes (Signed)
Subjective/Complaints: Poor sleep related to pain.  No problem in therapy yesterday Refusing CPAP, can't sleep with it on  Review of Systems - Negative except Left thigh pain Objective: Vital Signs: Blood pressure 95/63, pulse 106, temperature 98.1 F (36.7 C), temperature source Oral, resp. rate 18, height 5\' 3"  (1.6 m), weight 150.141 kg (331 lb), SpO2 94.00%. No results found. Results for orders placed during the hospital encounter of 10/09/13 (from the past 72 hour(s))  GLUCOSE, CAPILLARY     Status: Abnormal   Collection Time    10/12/13 11:21 AM      Result Value Range   Glucose-Capillary 144 (*) 70 - 99 mg/dL   Comment 1 Notify RN    GLUCOSE, CAPILLARY     Status: Abnormal   Collection Time    10/12/13  4:46 PM      Result Value Range   Glucose-Capillary 111 (*) 70 - 99 mg/dL   Comment 1 Notify RN    GLUCOSE, CAPILLARY     Status: Abnormal   Collection Time    10/12/13  8:50 PM      Result Value Range   Glucose-Capillary 152 (*) 70 - 99 mg/dL   Comment 1 Notify RN    GLUCOSE, CAPILLARY     Status: Abnormal   Collection Time    10/13/13  7:38 AM      Result Value Range   Glucose-Capillary 108 (*) 70 - 99 mg/dL   Comment 1 Notify RN    GLUCOSE, CAPILLARY     Status: Abnormal   Collection Time    10/13/13 11:31 AM      Result Value Range   Glucose-Capillary 137 (*) 70 - 99 mg/dL  GLUCOSE, CAPILLARY     Status: Abnormal   Collection Time    10/13/13  4:22 PM      Result Value Range   Glucose-Capillary 121 (*) 70 - 99 mg/dL  GLUCOSE, CAPILLARY     Status: Abnormal   Collection Time    10/13/13  9:24 PM      Result Value Range   Glucose-Capillary 153 (*) 70 - 99 mg/dL   Comment 1 Notify RN    GLUCOSE, CAPILLARY     Status: Abnormal   Collection Time    10/14/13  7:43 AM      Result Value Range   Glucose-Capillary 151 (*) 70 - 99 mg/dL   Comment 1 Notify RN    GLUCOSE, CAPILLARY     Status: Abnormal   Collection Time    10/14/13 11:26 AM      Result  Value Range   Glucose-Capillary 159 (*) 70 - 99 mg/dL   Comment 1 Notify RN    GLUCOSE, CAPILLARY     Status: Abnormal   Collection Time    10/14/13  4:39 PM      Result Value Range   Glucose-Capillary 131 (*) 70 - 99 mg/dL  GLUCOSE, CAPILLARY     Status: Abnormal   Collection Time    10/14/13  9:22 PM      Result Value Range   Glucose-Capillary 152 (*) 70 - 99 mg/dL   Comment 1 Notify RN    GLUCOSE, CAPILLARY     Status: Abnormal   Collection Time    10/15/13  7:41 AM      Result Value Range   Glucose-Capillary 137 (*) 70 - 99 mg/dL     HEENT: normal Cardio: RRR and no murmur Resp: CTA B/L and  unlabored GI: BS positive and NT Extremity:  Pulses positive and No Edema Skin:   Intact and Wound C/D/I and groin, distal thigh Neuro: Alert/Oriented, Normal Sensory and Abnormal Motor 3-/5 L KE, 2- L HF, 3/5 L ankle DF/PF, 5/5 in BUE and RLE Musc/Skel:  Swelling Left thigh Gen NAD   Assessment/Plan: 1. Functional deficits secondary to Left femoral shaft fx which require 3+ hours per day of interdisciplinary therapy in a comprehensive inpatient rehab setting. Physiatrist is providing close team supervision and 24 hour management of active medical problems listed below. Physiatrist and rehab team continue to assess barriers to discharge/monitor patient progress toward functional and medical goals.  FIM: FIM - Bathing Bathing Steps Patient Completed: Chest;Right Arm;Left Arm;Abdomen;Right upper leg;Left upper leg;Left lower leg (including foot);Right lower leg (including foot);Buttocks;Front perineal area Bathing: 4: Steadying assist  FIM - Upper Body Dressing/Undressing Upper body dressing/undressing steps patient completed: Thread/unthread right bra strap;Thread/unthread left bra strap;Hook/unhook bra;Thread/unthread right sleeve of pullover shirt/dresss;Thread/unthread left sleeve of pullover shirt/dress;Put head through opening of pull over shirt/dress;Pull shirt over trunk Upper  body dressing/undressing: 5: Set-up assist to: Obtain clothing/put away FIM - Lower Body Dressing/Undressing Lower body dressing/undressing steps patient completed: Thread/unthread right underwear leg;Thread/unthread left underwear leg;Thread/unthread right pants leg;Thread/unthread left pants leg;Don/Doff right sock;Don/Doff left sock;Pull underwear up/down;Pull pants up/down Lower body dressing/undressing: 4: Steadying Assist  FIM - Toileting Toileting steps completed by patient: Adjust clothing after toileting Toileting: 2: Max-Patient completed 1 of 3 steps  FIM - Diplomatic Services operational officer Devices: Best boy Transfers: 0-Activity did not occur  FIM - Banker Devices: Therapist, occupational: 4: Chair or W/C > Bed: Min A (steadying Pt. > 75%);4: Bed > Chair or W/C: Min A (steadying Pt. > 75%)  FIM - Locomotion: Wheelchair Distance: 150 Locomotion: Wheelchair: 5: Travels 150 ft or more: maneuvers on rugs and over door sills with supervision, cueing or coaxing FIM - Locomotion: Ambulation Locomotion: Ambulation Assistive Devices: Designer, industrial/product Ambulation/Gait Assistance: 4: Min guard Locomotion: Ambulation: 1: Travels less than 50 ft with minimal assistance (Pt.>75%)  Comprehension Comprehension Mode: Auditory Comprehension: 5-Understands basic 90% of the time/requires cueing < 10% of the time  Expression Expression Mode: Verbal Expression: 5-Expresses basic 90% of the time/requires cueing < 10% of the time.  Social Interaction Social Interaction: 4-Interacts appropriately 75 - 89% of the time - Needs redirection for appropriate language or to initiate interaction.  Problem Solving Problem Solving: 4-Solves basic 75 - 89% of the time/requires cueing 10 - 24% of the time  Memory Memory: 4-Recognizes or recalls 75 - 89% of the time/requires cueing 10 - 24% of the time  Medical Problem List and Plan:   1. DVT Prophylaxis/Anticoagulation: Pharmaceutical: Lovenox  2. Pain Management: prn medications D/C MS Contin 30mg  qhs try Oxycontin , schedule muscle relaxant 3. 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 Will have LCSW follow for evaluation.  4. Neuropsych: This patient is capable of making decisions on her own behalf.  5. DM type 2: Will monitor BS with ac/hs checks. Resume Amaryl 4 mg bid--anticipate BS to run high with stress. Will use SSI and meal coverage to keep BS controlled. Wean off levemir as Hgb A1c (7.6) --indicates reasonable control  6. Hypothyroid: Resume supplement.  7. Leucocytosis: Likely reactive.  8. Urinary retention: Will discontinue foley and start voiding trial. Get patient to [facility] to void.  9. Hypoxia: improved 10 Tachycardia: likely due to deconditioning. Monitor heart rate with bid checks.  Monitor for symptoms with increase in activity.    LOS (Days) 6 A FACE TO FACE EVALUATION WAS PERFORMED  Mandel Seiden E 10/15/2013, 9:57 AM

## 2013-10-15 NOTE — Progress Notes (Signed)
Occupational Therapy Note Patient Details  Name: Alexandra Kelley MRN: [id number] Date of Birth: [birth year] Today's Date: 10/15/2013  Time: 1045-1145  60 min) Pain:5/10 initially; 10/10 at end of session Individual session  Pt. Sitting in wc upon OT arrival.  Addressed wc mobility, transfers, PWB on LLE, AE usage.  Assisted over bathroom doorway ledge in wc.  .  Transferred to shower bench with mod assist to maintain PWB on left leg.  Pt. Washed self with LH sponge for peri area in lateral sitting.  Transferred back to wc with max cues to maintain PWB plus push through walker (mod assist) with arms.  Dressed with AE, and got socks, pants on.  Unable to pull up pants over hips due to leg pain  Applied ice back to left thigh.  Informed RN about severe pain. Left patient in room with ice packs, and call bell in reach.      Humberto Seals 10/15/2013, 11:15 AM

## 2013-10-16 ENCOUNTER — Inpatient Hospital Stay (HOSPITAL_COMMUNITY): Payer: Medicaid Other

## 2013-10-16 ENCOUNTER — Inpatient Hospital Stay (HOSPITAL_COMMUNITY): Payer: Medicaid Other | Admitting: Physical Therapy

## 2013-10-16 ENCOUNTER — Inpatient Hospital Stay (HOSPITAL_COMMUNITY): Payer: Medicaid Other | Admitting: *Deleted

## 2013-10-16 DIAGNOSIS — S7290XA Unspecified fracture of unspecified femur, initial encounter for closed fracture: Secondary | ICD-10-CM

## 2013-10-16 DIAGNOSIS — D62 Acute posthemorrhagic anemia: Secondary | ICD-10-CM

## 2013-10-16 DIAGNOSIS — E669 Obesity, unspecified: Secondary | ICD-10-CM

## 2013-10-16 DIAGNOSIS — W19XXXA Unspecified fall, initial encounter: Secondary | ICD-10-CM

## 2013-10-16 LAB — URINALYSIS, ROUTINE W REFLEX MICROSCOPIC
Ketones, ur: NEGATIVE mg/dL
Nitrite: NEGATIVE
Protein, ur: NEGATIVE mg/dL
Urobilinogen, UA: 0.2 mg/dL (ref 0.0–1.0)

## 2013-10-16 LAB — CREATININE, SERUM
Creatinine, Ser: 0.74 mg/dL (ref 0.50–1.10)
GFR calc Af Amer: >90 mL/min (ref >90)
GFR calc non Af Amer: >90 mL/min (ref >90)

## 2013-10-16 LAB — URINE MICROSCOPIC-ADD ON

## 2013-10-16 LAB — GLUCOSE, CAPILLARY
Glucose-Capillary: 134 mg/dL — ABNORMAL HIGH (ref 70–99)
Glucose-Capillary: 172 mg/dL — ABNORMAL HIGH (ref 70–99)

## 2013-10-16 LAB — BASIC METABOLIC PANEL
BUN: 10 mg/dL (ref 6–23)
Chloride: 95 mEq/L — ABNORMAL LOW (ref 96–112)
GFR calc Af Amer: >90 mL/min (ref >90)
GFR calc non Af Amer: >90 mL/min (ref >90)
Glucose, Bld: 162 mg/dL — ABNORMAL HIGH (ref 70–99)
Potassium: 4.3 mEq/L (ref 3.5–5.1)
Sodium: 137 mEq/L (ref 135–145)

## 2013-10-16 MED ORDER — AMITRIPTYLINE HCL 10 MG PO TABS
10.0000 mg | ORAL_TABLET | Freq: Every day | ORAL | Status: DC
  Administered 2013-10-16 – 2013-10-19 (×4): 10 mg via ORAL
  Filled 2013-10-16 (×6): qty 1

## 2013-10-16 NOTE — Progress Notes (Addendum)
Recreational Therapy Session Note  Patient Details  Name: ENMA MAEDA MRN: [id number] Date of Birth: 10/19/1981 Today's Date: 10/16/2013 Time:  Pain:  Skilled Therapeutic Interventions/Progress Updates: Session focused on activity tolerance, dynamic standing balance, short distance ambulation using RW.  Pt requires mod verbal cues for encouragement throughout session.  Pt requires min assist for standing.   Zuri Lascala 10/16/2013, 4:13 PM

## 2013-10-16 NOTE — Progress Notes (Signed)
Subjective/Complaints: Poor sleep pain was better last noc Refusing CPAP, can't sleep with it on  Review of Systems - Negative except Left thigh pain Objective: Vital Signs: Blood pressure 106/73, pulse 106, temperature 98.4 F (36.9 C), temperature source Oral, resp. rate 18, height 5\' 3"  (1.6 m), weight 150.141 kg (331 lb), SpO2 93.00%. No results found. Results for orders placed during the hospital encounter of 10/09/13 (from the past 72 hour(s))  GLUCOSE, CAPILLARY     Status: Abnormal   Collection Time    10/13/13  7:38 AM      Result Value Range   Glucose-Capillary 108 (*) 70 - 99 mg/dL   Comment 1 Notify RN    GLUCOSE, CAPILLARY     Status: Abnormal   Collection Time    10/13/13 11:31 AM      Result Value Range   Glucose-Capillary 137 (*) 70 - 99 mg/dL  GLUCOSE, CAPILLARY     Status: Abnormal   Collection Time    10/13/13  4:22 PM      Result Value Range   Glucose-Capillary 121 (*) 70 - 99 mg/dL  GLUCOSE, CAPILLARY     Status: Abnormal   Collection Time    10/13/13  9:24 PM      Result Value Range   Glucose-Capillary 153 (*) 70 - 99 mg/dL   Comment 1 Notify RN    GLUCOSE, CAPILLARY     Status: Abnormal   Collection Time    10/14/13  7:43 AM      Result Value Range   Glucose-Capillary 151 (*) 70 - 99 mg/dL   Comment 1 Notify RN    GLUCOSE, CAPILLARY     Status: Abnormal   Collection Time    10/14/13 11:26 AM      Result Value Range   Glucose-Capillary 159 (*) 70 - 99 mg/dL   Comment 1 Notify RN    GLUCOSE, CAPILLARY     Status: Abnormal   Collection Time    10/14/13  4:39 PM      Result Value Range   Glucose-Capillary 131 (*) 70 - 99 mg/dL  GLUCOSE, CAPILLARY     Status: Abnormal   Collection Time    10/14/13  9:22 PM      Result Value Range   Glucose-Capillary 152 (*) 70 - 99 mg/dL   Comment 1 Notify RN    GLUCOSE, CAPILLARY     Status: Abnormal   Collection Time    10/15/13  7:41 AM      Result Value Range   Glucose-Capillary 137 (*) 70 - 99  mg/dL  GLUCOSE, CAPILLARY     Status: Abnormal   Collection Time    10/15/13 11:28 AM      Result Value Range   Glucose-Capillary 166 (*) 70 - 99 mg/dL  GLUCOSE, CAPILLARY     Status: Abnormal   Collection Time    10/15/13  5:42 PM      Result Value Range   Glucose-Capillary 140 (*) 70 - 99 mg/dL  GLUCOSE, CAPILLARY     Status: Abnormal   Collection Time    10/15/13  8:33 PM      Result Value Range   Glucose-Capillary 136 (*) 70 - 99 mg/dL   Comment 1 Notify RN    CREATININE, SERUM     Status: None   Collection Time    10/16/13  5:30 AM      Result Value Range   Creatinine, Ser 0.74  0.50 -  1.10 mg/dL   GFR calc non Af Amer >90  >90 mL/min   GFR calc Af Amer >90  >90 mL/min   Comment: (NOTE)     The eGFR has been calculated using the CKD EPI equation.     This calculation has not been validated in all clinical situations.     eGFR's persistently <90 mL/min signify possible Chronic Kidney     Disease.     HEENT: normal Cardio: RRR and no murmur Resp: CTA B/L and unlabored GI: BS positive and NT Extremity:  Pulses positive and No Edema Skin:   Intact and Wound C/D/I and groin, distal thigh Neuro: Alert/Oriented, Normal Sensory and Abnormal Motor 3-/5 L KE, 2- L HF, 3/5 L ankle DF/PF, 5/5 in BUE and RLE Musc/Skel:  Swelling Left thigh Gen NAD   Assessment/Plan: 1. Functional deficits secondary to Left femoral shaft fx which require 3+ hours per day of interdisciplinary therapy in a comprehensive inpatient rehab setting. Physiatrist is providing close team supervision and 24 hour management of active medical problems listed below. Physiatrist and rehab team continue to assess barriers to discharge/monitor patient progress toward functional and medical goals.  FIM: FIM - Bathing Bathing Steps Patient Completed: Chest;Right Arm;Left Arm;Abdomen;Right upper leg;Left upper leg;Left lower leg (including foot);Right lower leg (including foot);Buttocks;Front perineal  area Bathing: 4: Steadying assist  FIM - Upper Body Dressing/Undressing Upper body dressing/undressing steps patient completed: Thread/unthread right bra strap;Thread/unthread left bra strap;Hook/unhook bra;Thread/unthread right sleeve of pullover shirt/dresss;Thread/unthread left sleeve of pullover shirt/dress;Put head through opening of pull over shirt/dress;Pull shirt over trunk Upper body dressing/undressing: 5: Set-up assist to: Obtain clothing/put away FIM - Lower Body Dressing/Undressing Lower body dressing/undressing steps patient completed: Thread/unthread right underwear leg;Thread/unthread left underwear leg;Thread/unthread right pants leg;Thread/unthread left pants leg;Don/Doff right sock;Don/Doff left sock;Pull underwear up/down;Pull pants up/down Lower body dressing/undressing: 3: Mod-Patient completed 50-74% of tasks  FIM - Toileting Toileting steps completed by patient: Adjust clothing after toileting Toileting: 0: Activity did not occur  FIM - Diplomatic Services operational officer Devices: Best boy Transfers: 0-Activity did not occur  FIM - Banker Devices: Therapist, occupational: 0: Activity did not occur  FIM - Locomotion: Wheelchair Distance: 150 Locomotion: Wheelchair: 5: Travels 150 ft or more: maneuvers on rugs and over door sills with supervision, cueing or coaxing FIM - Locomotion: Ambulation Locomotion: Ambulation Assistive Devices: Designer, industrial/product Ambulation/Gait Assistance: 4: Min guard Locomotion: Ambulation: 1: Travels less than 50 ft with minimal assistance (Pt.>75%)  Comprehension Comprehension Mode: Auditory Comprehension: 5-Understands basic 90% of the time/requires cueing < 10% of the time  Expression Expression Mode: Verbal Expression: 5-Expresses basic 90% of the time/requires cueing < 10% of the time.  Social Interaction Social Interaction: 4-Interacts appropriately 75 - 89%  of the time - Needs redirection for appropriate language or to initiate interaction.  Problem Solving Problem Solving: 4-Solves basic 75 - 89% of the time/requires cueing 10 - 24% of the time  Memory Memory: 4-Recognizes or recalls 75 - 89% of the time/requires cueing 10 - 24% of the time  Medical Problem List and Plan:  1. DVT Prophylaxis/Anticoagulation: Pharmaceutical: Lovenox  2. Pain Management: prn medications cont Oxycontin , schedule muscle relaxant, add elavil low dose 3. Mood: Flat affect--high functioning Jeral Pinch Will have LCSW follow for evaluation.  4. Neuropsych: This patient is capable of making decisions on her own behalf.  5. DM type 2: Will monitor BS with ac/hs checks.controlled on Amaryl 4 mg bid--anticipate BS  to run high with stress. Will use SSI and meal coverage to keep BS controlled. Wean off levemir as Hgb A1c (7.6) --indicates reasonable control  6. Hypothyroid: Resume supplement.  7. Leucocytosis: Likely reactive.  8. Urinary retention: Will discontinue foley and start voiding trial. Get patient to [facility] to void.  9. Hypoxia: improved 10 Tachycardia: likely due to deconditioning. Monitor heart rate with bid checks. Monitor for symptoms with increase in activity.    LOS (Days) 7 A FACE TO FACE EVALUATION WAS PERFORMED  Kalan Rinn E 10/16/2013, 7:17 AM

## 2013-10-16 NOTE — Plan of Care (Signed)
Problem: RH SAFETY Goal: RH STG DECREASED RISK OF FALL WITH ASSISTANCE STG Decreased Risk of Fall With Assistance. supervision  Outcome: Not Progressing Patient getting out of bed at [facility] with poor safety awareness, combative to staff, hallucinating.adm

## 2013-10-16 NOTE — Progress Notes (Signed)
Physical Therapy Session Note  Patient Details  Name: Alexandra Kelley MRN: [id number] Date of Birth: 1981/03/06  Today's Date: 10/16/2013 Time: 4098-1191 Time Calculation (min): 45 min  Short Term Goals: Week 1:  PT Short Term Goal 1 (Week 1): Pt to perform supine<>sit transfer with consistent min A with HOB flat. PT Short Term Goal 2 (Week 1): Pt to perform bed<>chair transfer with consistent min A, min cueing to adhere to LLE PWB. PT Short Term Goal 3 (Week 1): Pt to ambulate 25' with LRAD requiring min A, min cueing to adhere to LLE PWB. PT Short Term Goal 4 (Week 1): Pt to perform self-propulsion of w/c x150' without rest break for functional endurance. PT Short Term Goal 5 (Week 1): Pt to negotiate single step without rails using LRAD with mod A.  Skilled Therapeutic Interventions/Progress Updates:    Treatment Session 1: Pt received seated in w/c; agreeable to therapy session. Session was planned to focus on initiating negotiation of single step with rolling walker to simulate entrance of pt home. Pt performed self-propulsion of w/c x175' using bilat UE's with supervision. Once in treatment gym, pt requests to change location of session due to gym being "too crowded," per pt. Therapist transported pt to alternative gym to facilitate pt comfort. Once in alterative gym, pt reports feeling comfortable in environment. Performed sit>stand from w/c to rolling walker with min A, verbal cueing for hand placement. Upon standing, pt immediately became tearful, stating that she was fearful to attempt single step due to pt-perceived difficulty of task. Therapist utilized therapeutic use of self to console pt. Within 3 minutes, pt was calm and agreed to attempt negotiation of single step. Immediately after standing, pt again requested to sit down secondary to feeling dizzy. Pt immediately repositioned into seated in w/c with the following vitals: BP 110/67, HR 109, SpO2 91%. After symptoms subsided  (wiithin 2 minutes), pt performed sit>stand from w/c to rolling walker; vitals were BP 87/63, HR 119, and onset of dizziness. Upon sitting, symptoms resolved within 2 minutes. Therapist transported pt to room, gave pt a cup of water, and encouraged fluid intake while therapist notified RN of pt symptoms, change in vital signs with positional change. Therapist departed with pt seated in w/c with LLE elevated and all needs within reach.  Treatment Session 2: Prior to session, RN notified this therapist that symptoms, orthostatic hypotension had resolved, per manual BP taken after AM session. No c/o dizziness or nausea during PM session. Pt received seated in w/c; agreeable to therapy. Session focused on dynamic standing balance and gait in home (carpeted) environment. Pt performed self-propulsion of w/c x160' using bilat UE's with supervision. Pt performed gait x18'  in home (carpeted) environment with rolling walker, step-to pattern, min guard, and max encouragement. Gait trial ended, per pt request, secondary to fatigue. W/c mobility x150' in controlled and home environment with supervision. Once in apartment, pt performed gait x15' in home (carpeted) environment with rolling walker and min guard, mod encouragement, and mod verbal cueing to maintain hip precautions. Without rest break, pt then performed static/dynamic standing balance (standing at countertop, cutting blocks of cheese into small pieces) x4 min, 45 sec with intermittent UE support and min-mod A for stability/balance. Standing balance trial ended secondary to pt fatigue. Therapist transported pt to room secondary to pt fatigue. Therapist departed with pt seated in w/c with LLE elevated and all needs within reach.   Therapy Documentation Precautions:  Precautions Precautions: Fall Restrictions Weight Bearing Restrictions:  Yes LLE Weight Bearing: Partial weight bearing (Left Leg) LLE Partial Weight Bearing Percentage or Pounds:  50% General: Amount of Missed PT Time (min): 15 Minutes Missed Time Reason: Patient ill (comment) (patient dizzy, nauseated) Pain: Pain Assessment Pain Assessment: 0-10 Pain Score: 4  Pain Type: Acute pain Pain Location: Leg Pain Orientation: Left Pain Descriptors / Indicators: Aching Pain Frequency: Constant Pain Onset: On-going Patients Stated Pain Goal: 1 Pain Intervention(s): Medication (See eMAR) Multiple Pain Sites: No Locomotion : Ambulation Ambulation/Gait Assistance: 4: Min guard Wheelchair Mobility Distance: 160  See FIM for current functional status  Therapy/Group: Individual Therapy  Calvert Cantor 10/16/2013, 4:23 PM

## 2013-10-16 NOTE — Progress Notes (Signed)
Pt refuses CPAP 

## 2013-10-16 NOTE — Progress Notes (Signed)
Social Work Patient ID: Alexandra Kelley, female   DOB: July 01, 1981, 32 y.o.   MRN: [id number] Pt requires a wheelchair for self propel and uses for self care she is unable to ambulate except to transfer due to her weight bearing status. She will need a walker to assist with her transfers.  Mom here and observing in therapies, gave her the completed FMLA forms to turn in to her employer. Work toward discharge this Friday.

## 2013-10-16 NOTE — Progress Notes (Signed)
Occupational Therapy Session Note  Patient Details  Name: Alexandra Kelley MRN: [id number] Date of Birth: [birth year]  Today's Date: 10/16/2013 Time: 1478-2956 and 1130-1200 Time Calculation (min): 60 min and 30 min   Short Term Goals: Week 1:  OT Short Term Goal 1 (Week 1): Focus on LTGs seondary to short ELOS  Skilled Therapeutic Interventions/Progress Updates:    Session 1: Pt seen for ADL retaining with focus on pain management, sit<>stand, and use of AE. Pt requesting to complete sponge bath this AM. Utilized LH sponge, sock-aid and reacher during LB self-care. Completed sit<>stand with min assist and in verbal cues for technique while following PWB. Pt required min assist for standing balance during clothing management and assist to manage clothing over L hip secondary to fear of falling and causing pain. Pt tearful on one occasion during therapy session d/t pain. Discussed tub transfer with mother present and she plans no being in therapy tomorrow when pt will practice it again. Pt required frequent rest breaks during therapy session d/t fatigue. At end of session therapist applied ice to LLE for pain management and pt left sitting in w/c with all needs in reach.   Session 2: Pt received sitting in w/c. Engaged in simple meal activity of assisting with preparing for patient holiday party. Pt requesting to remain in w/c d/t pain in LLE. Pt cut blocks of cheese requiring increased strength using utensils provided and elbows unsupported for UE strengthening. Required increased time for this and no c/o pain throughout. Pt then stood approx 2 minutes with min assist for sit<>stand and min guard for balance (mainly d/t comfort of pt as she is fearful of falling) to place cheese in bags. Pt very excited about standing and did not become tearful. At end of session therapist assisted with transferring pt to [facility] with min assist for stand pivot and left with tech present. RN aware of pt reporting 10/10  pain.   Therapy Documentation Precautions:  Precautions Precautions: Fall Restrictions Weight Bearing Restrictions: Yes LLE Weight Bearing: Partial weight bearing LLE Partial Weight Bearing Percentage or Pounds: 50% General:   Vital Signs:   Pain: Session 1 &2: Pt reporting 10/10 pain at LLE and had received pain meds. Pt tends to perseverate on numerical ratings of 10/10 and 5/10 and does appear to be in pain at this level during sessions.    Other Treatments:    See FIM for current functional status  Therapy/Group: Individual Therapy  Daneil Dan 10/16/2013, 9:42 AM

## 2013-10-17 ENCOUNTER — Inpatient Hospital Stay (HOSPITAL_COMMUNITY): Payer: Medicaid Other

## 2013-10-17 ENCOUNTER — Ambulatory Visit (HOSPITAL_COMMUNITY): Payer: Medicaid Other | Admitting: *Deleted

## 2013-10-17 ENCOUNTER — Inpatient Hospital Stay (HOSPITAL_COMMUNITY): Payer: Medicaid Other | Admitting: Physical Therapy

## 2013-10-17 LAB — URINE CULTURE: Colony Count: NO GROWTH

## 2013-10-17 LAB — GLUCOSE, CAPILLARY
Glucose-Capillary: 136 mg/dL — ABNORMAL HIGH (ref 70–99)
Glucose-Capillary: 146 mg/dL — ABNORMAL HIGH (ref 70–99)

## 2013-10-17 NOTE — Progress Notes (Signed)
Occupational Therapy Session Note  Patient Details  Name: Alexandra Kelley MRN: [id number] Date of Birth: 06/28/1981  Today's Date: 10/17/2013 Time:0830-0930 and  2536-6440 Time calculation (min): 60 min and 45 min   Short Term Goals: Week 1:  OT Short Term Goal 1 (Week 1): Focus on LTGs seondary to short ELOS  Skilled Therapeutic Interventions/Progress Updates:    Session 1: Pt seen for ADL retraining with focus on use of AE, standing balance, and functional transfers. Pt ambulated short distance from room to walk-in shower with min assist and mod cues for encouragement. Pt declining shower in ADL apartment after encouragement from therapist and mother. Pt complete 4 sit<>stand during self-care tasks with supervision and increased time with max cues of encouragement. Required increased time with use of AE however required no cues. Pt not tearful during this ADL session however required frequent rest breaks d/t fatigue and pain.   Session 2: Therapy session focused on UE strengthening, activity tolerance, and standing tolerance. Completed theraband HEP while following handout with min cues. Pt completed 15 reps x2 sets of each exercise with rest breaks between each. Pt propelled self to ADL apartment with supervision and 1 rest break. Assisted with home management task of making pudding while propelling self in w/c to retrieve items for UE strengthening and activity tolerance. Pt declining standing d/t pain however after max cues for encouragement pt stood approx 20 sec while stirring pudding with min assist for balance. Pt then transferred pudding to individual cups without UE supported on table and 2 rest breaks. Pt propelled self in w/c from ADL apartment to room without rest break and mod cues of encouragement. Pt left in w/c with nurse tech present and all needs in reach.   Therapy Documentation Precautions:  Precautions Precautions: Fall Restrictions Weight Bearing Restrictions:  Yes LLE Weight Bearing: Partial weight bearing LLE Partial Weight Bearing Percentage or Pounds: 50 General:   Vital Signs:   Pain: Pain Assessment Pain Assessment: 0-10 Pain Score: 5  Pain Type: Surgical pain Pain Location: Leg Pain Orientation: Left Pain Descriptors / Indicators: Aching Pain Onset: On-going Patients Stated Pain Goal: 1 Pain Intervention(s): Medication (See eMAR) Multiple Pain Sites: No Other Treatments:    See FIM for current functional status  Therapy/Group: Individual Therapy  Daneil Dan 10/17/2013, 12:11 PM

## 2013-10-17 NOTE — Progress Notes (Signed)
Subjective/Complaints: Muscle cramps otherwise slept ok Refusing CPAP, can't sleep with it on  Review of Systems - Negative except Left thigh pain Objective: Vital Signs: Blood pressure 107/54, pulse 102, temperature 98.1 F (36.7 C), temperature source Oral, resp. rate 19, height 5\' 3"  (1.6 m), weight 151 kg (332 lb 14.3 oz), SpO2 96.00%. No results found. Results for orders placed during the hospital encounter of 10/09/13 (from the past 72 hour(s))  GLUCOSE, CAPILLARY     Status: Abnormal   Collection Time    10/14/13  7:43 AM      Result Value Range   Glucose-Capillary 151 (*) 70 - 99 mg/dL   Comment 1 Notify RN    GLUCOSE, CAPILLARY     Status: Abnormal   Collection Time    10/14/13 11:26 AM      Result Value Range   Glucose-Capillary 159 (*) 70 - 99 mg/dL   Comment 1 Notify RN    GLUCOSE, CAPILLARY     Status: Abnormal   Collection Time    10/14/13  4:39 PM      Result Value Range   Glucose-Capillary 131 (*) 70 - 99 mg/dL  GLUCOSE, CAPILLARY     Status: Abnormal   Collection Time    10/14/13  9:22 PM      Result Value Range   Glucose-Capillary 152 (*) 70 - 99 mg/dL   Comment 1 Notify RN    GLUCOSE, CAPILLARY     Status: Abnormal   Collection Time    10/15/13  7:41 AM      Result Value Range   Glucose-Capillary 137 (*) 70 - 99 mg/dL  GLUCOSE, CAPILLARY     Status: Abnormal   Collection Time    10/15/13 11:28 AM      Result Value Range   Glucose-Capillary 166 (*) 70 - 99 mg/dL  GLUCOSE, CAPILLARY     Status: Abnormal   Collection Time    10/15/13  5:42 PM      Result Value Range   Glucose-Capillary 140 (*) 70 - 99 mg/dL  GLUCOSE, CAPILLARY     Status: Abnormal   Collection Time    10/15/13  8:33 PM      Result Value Range   Glucose-Capillary 136 (*) 70 - 99 mg/dL   Comment 1 Notify RN    CREATININE, SERUM     Status: None   Collection Time    10/16/13  5:30 AM      Result Value Range   Creatinine, Ser 0.74  0.50 - 1.10 mg/dL   GFR calc non Af Amer  >90  >90 mL/min   GFR calc Af Amer >90  >90 mL/min   Comment: (NOTE)     The eGFR has been calculated using the CKD EPI equation.     This calculation has not been validated in all clinical situations.     eGFR's persistently <90 mL/min signify possible Chronic Kidney     Disease.  GLUCOSE, CAPILLARY     Status: Abnormal   Collection Time    10/16/13 12:09 PM      Result Value Range   Glucose-Capillary 149 (*) 70 - 99 mg/dL  URINALYSIS, ROUTINE W REFLEX MICROSCOPIC     Status: Abnormal   Collection Time    10/16/13 12:24 PM      Result Value Range   Color, Urine YELLOW  YELLOW   APPearance HAZY (*) CLEAR   Specific Gravity, Urine 1.016  1.005 - 1.030  pH 5.5  5.0 - 8.0   Glucose, UA NEGATIVE  NEGATIVE mg/dL   Hgb urine dipstick NEGATIVE  NEGATIVE   Bilirubin Urine NEGATIVE  NEGATIVE   Ketones, ur NEGATIVE  NEGATIVE mg/dL   Protein, ur NEGATIVE  NEGATIVE mg/dL   Urobilinogen, UA 0.2  0.0 - 1.0 mg/dL   Nitrite NEGATIVE  NEGATIVE   Leukocytes, UA MODERATE (*) NEGATIVE  URINE MICROSCOPIC-ADD ON     Status: Abnormal   Collection Time    10/16/13 12:24 PM      Result Value Range   Squamous Epithelial / LPF FEW (*) RARE   WBC, UA 11-20  <3 WBC/hpf   Bacteria, UA FEW (*) RARE  BASIC METABOLIC PANEL     Status: Abnormal   Collection Time    10/16/13  1:50 PM      Result Value Range   Sodium 137  135 - 145 mEq/L   Potassium 4.3  3.5 - 5.1 mEq/L   Chloride 95 (*) 96 - 112 mEq/L   CO2 26  19 - 32 mEq/L   Glucose, Bld 162 (*) 70 - 99 mg/dL   BUN 10  6 - 23 mg/dL   Creatinine, Ser 1.47  0.50 - 1.10 mg/dL   Calcium 9.9  8.4 - 82.9 mg/dL   GFR calc non Af Amer >90  >90 mL/min   GFR calc Af Amer >90  >90 mL/min   Comment: (NOTE)     The eGFR has been calculated using the CKD EPI equation.     This calculation has not been validated in all clinical situations.     eGFR's persistently <90 mL/min signify possible Chronic Kidney     Disease.  GLUCOSE, CAPILLARY     Status: Abnormal    Collection Time    10/16/13  4:35 PM      Result Value Range   Glucose-Capillary 134 (*) 70 - 99 mg/dL   Comment 1 Notify RN    GLUCOSE, CAPILLARY     Status: Abnormal   Collection Time    10/16/13  8:42 PM      Result Value Range   Glucose-Capillary 172 (*) 70 - 99 mg/dL     HEENT: normal Cardio: RRR and no murmur Resp: CTA B/L and unlabored GI: BS positive and NT Extremity:  Pulses positive and No Edema Skin:   Intact and Wound C/D/I and groin, distal thigh Neuro: Alert/Oriented, Normal Sensory and Abnormal Motor 3-/5 L KE, 2- L HF, 3/5 L ankle DF/PF, 5/5 in BUE and RLE Musc/Skel:  Swelling Left thigh Gen NAD   Assessment/Plan: 1. Functional deficits secondary to Left femoral shaft fx which require 3+ hours per day of interdisciplinary therapy in a comprehensive inpatient rehab setting. Physiatrist is providing close team supervision and 24 hour management of active medical problems listed below. Physiatrist and rehab team continue to assess barriers to discharge/monitor patient progress toward functional and medical goals.  FIM: FIM - Bathing Bathing Steps Patient Completed: Chest;Right Arm;Left Arm;Abdomen;Right upper leg;Left upper leg;Left lower leg (including foot);Right lower leg (including foot);Buttocks Bathing: 4: Min-Patient completes 8-9 2f 10 parts or 75+ percent  FIM - Upper Body Dressing/Undressing Upper body dressing/undressing steps patient completed: Thread/unthread right bra strap;Thread/unthread left bra strap;Hook/unhook bra;Thread/unthread right sleeve of pullover shirt/dresss;Thread/unthread left sleeve of pullover shirt/dress;Put head through opening of pull over shirt/dress;Pull shirt over trunk Upper body dressing/undressing: 5: Set-up assist to: Obtain clothing/put away FIM - Lower Body Dressing/Undressing Lower body dressing/undressing steps patient completed:  Thread/unthread right underwear leg;Thread/unthread left underwear leg;Thread/unthread  right pants leg;Thread/unthread left pants leg;Don/Doff right sock;Don/Doff left sock;Pull pants up/down Lower body dressing/undressing: 4: Min-Patient completed 75 plus % of tasks  FIM - Toileting Toileting steps completed by patient: Adjust clothing after toileting Toileting: 0: Activity did not occur  FIM - Diplomatic Services operational officer Devices: Best boy Transfers: 4-To toilet/BSC: Min A (steadying Pt. > 75%);4-From toilet/BSC: Min A (steadying Pt. > 75%)  FIM - Bed/Chair Transfer Bed/Chair Transfer Assistive Devices: Therapist, occupational: 4: Chair or W/C > Bed: Min A (steadying Pt. > 75%);4: Bed > Chair or W/C: Min A (steadying Pt. > 75%)  FIM - Locomotion: Wheelchair Distance: 160 Locomotion: Wheelchair: 5: Travels 150 ft or more: maneuvers on rugs and over door sills with supervision, cueing or coaxing FIM - Locomotion: Ambulation Locomotion: Ambulation Assistive Devices: Designer, industrial/product Ambulation/Gait Assistance: 4: Min guard Locomotion: Ambulation: 1: Travels less than 50 ft with minimal assistance (Pt.>75%)  Comprehension Comprehension Mode: Auditory Comprehension: 5-Understands basic 90% of the time/requires cueing < 10% of the time  Expression Expression Mode: Verbal Expression: 5-Expresses basic 90% of the time/requires cueing < 10% of the time.  Social Interaction Social Interaction: 4-Interacts appropriately 75 - 89% of the time - Needs redirection for appropriate language or to initiate interaction.  Problem Solving Problem Solving: 4-Solves basic 75 - 89% of the time/requires cueing 10 - 24% of the time  Memory Memory: 4-Recognizes or recalls 75 - 89% of the time/requires cueing 10 - 24% of the time  Medical Problem List and Plan:  1. DVT Prophylaxis/Anticoagulation: Pharmaceutical: Lovenox  2. Pain Management: prn medications ,hs Oxycontin , schedule muscle relaxant 3. 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 Will  have LCSW follow for evaluation.  4. Neuropsych: This patient is capable of making decisions on her own behalf.  5. DM type 2: Will monitor BS with ac/hs checks. Resume Amaryl 4 mg bid--anticipate BS to run high with stress. Will use SSI and meal coverage to keep BS controlled. Wean off levemir as Hgb A1c (7.6) --indicates reasonable control  6. Hypothyroid: Resume supplement.  7. Leucocytosis: Likely reactive.  8. Urinary retention: Will discontinue foley and start voiding trial. Get patient to [facility] to void.  9. Hypoxia: improved 10 Tachycardia: likely due to deconditioning. Monitor heart rate with bid checks. Monitor for symptoms with increase in activity.    LOS (Days) 8 A FACE TO FACE EVALUATION WAS PERFORMED  KIRSTEINS,ANDREW E 10/17/2013, 7:29 AM

## 2013-10-17 NOTE — Progress Notes (Signed)
Physical Therapy Session Note  Patient Details  Name: Alexandra Kelley MRN: [id number] Date of Birth: 10-16-1981  Today's Date: 10/17/2013 Time: 1000-1109 and 1315-1330 (total time in room:1306-1330; 9 minutes non-billable) Time Calculation (min): 69 min and 15 min  Short Term Goals: Week 1:  PT Short Term Goal 1 (Week 1): Pt to perform supine<>sit transfer with consistent min A with HOB flat. PT Short Term Goal 2 (Week 1): Pt to perform bed<>chair transfer with consistent min A, min cueing to adhere to LLE PWB. PT Short Term Goal 3 (Week 1): Pt to ambulate 25' with LRAD requiring min A, min cueing to adhere to LLE PWB. PT Short Term Goal 4 (Week 1): Pt to perform self-propulsion of w/c x150' without rest break for functional endurance. PT Short Term Goal 5 (Week 1): Pt to negotiate single step without rails using LRAD with mod A.  Skilled Therapeutic Interventions/Progress Updates:    Treatment Session 1: Pt received seated in w/c; agreeable to session. Stand-pivot transfer from w/c<>bedside commode with rolling walker and min A. W/c mobility x150' with bilat UE's with supervision. Negotiation of single 6" step (to simulate primary entrance to pt's home) using rolling walker requiring mod A, max encouragement, and mod verbal cueing for correct sequencing. Gait x22' with rolling walker, step-to pattern requiring min guard, min verbal cueing for adherence to precautions. During gait trial, pt became tearful secondary to fatigue; able to safely finish trial with max encouragement. Applied cold pack with compression for final 20 minutes of session to address LLE pain. Transported pt to apartment kitchen in w/c, where pt performed dynamic standing (stirring, pouring pudding mix) x2 minutes total prior to pt requiring seated rest break due to fatigue. Therapist transported pt to room in w/c secondary to pt fatigue. Multiple seated rest breaks required throughout session. Therapist departed with pt  seated in w/c with all needs within reach.   Treatment Session 2:Pt received seated in w/c; asleep but easily awakened. Per pt request, performed stand-pivot transfer from w/c<>bedside commode with min A for anterior weight shift during sit>stand, mod verbal cueing for hand positioning (with inconsistent carryover) and to adhere to LLE PWB (with effective carryover). Sit>stand from bedside commode to rolling walker with min A. Pt seated on bedside commode x9 minutes (non-billable). Dynamic standing (for pulling up pants) with single UE support at RW with min guard for stability/balance. Therapist departed with pt seated in w/c with LLE elevated and all needs within reach.  Therapy Documentation Precautions:  Precautions Precautions: Fall Restrictions Weight Bearing Restrictions: Yes LLE Weight Bearing: Partial weight bearing LLE Partial Weight Bearing Percentage or Pounds: 50 Pain: Pain Assessment Pain Assessment: FLACC Pain Score: 2  Pain Type: Surgical pain Pain Location: Leg Pain Orientation: Left Pain Descriptors / Indicators: Aching Pain Frequency: Constant Pain Onset: On-going Patients Stated Pain Goal: 1 Pain Intervention(s): RN made aware;Cold applied;Repositioned;Elevated extremity;Rest Multiple Pain Sites: No Locomotion : Ambulation Ambulation/Gait Assistance: 4: Min guard Wheelchair Mobility Distance: 150   See FIM for current functional status  Therapy/Group: Individual Therapy  Amedeo Detweiler, Lorenda Ishihara 10/17/2013, 11:20 AM

## 2013-10-17 NOTE — Progress Notes (Signed)
Recreational Therapy Session Note  Patient Details  Name: Alexandra Kelley MRN: [id number] Date of Birth: 30-Jul-1981 Today's Date: 10/17/2013  Pain: c/o intermittent pain, ice applied, premedicated Skilled Therapeutic Interventions/Progress Updates: session focused on activity tolerance, w/c mobility, dynamic sitting/standing balance & ambulation using RW. Pt requires mod cues for encouragement and benefits from reward system for goal achievement.  Pt performed simple kitchen task in preparation for pt's party with supervision/set up assist.  Pt propelled w/c in home like environment with supervision using BUE's.  Pt ambulated ~22 feet using RW with min assist & mod verbal cues for [facility] & encouragement.   Debroah Shuttleworth 10/17/2013, 2:25 PM

## 2013-10-17 NOTE — Progress Notes (Signed)
Patient continues to refuse CPAP. RT will continue to assist as needed. 

## 2013-10-18 ENCOUNTER — Inpatient Hospital Stay (HOSPITAL_COMMUNITY): Payer: Medicaid Other

## 2013-10-18 ENCOUNTER — Inpatient Hospital Stay (HOSPITAL_COMMUNITY): Payer: Medicaid Other | Admitting: Physical Therapy

## 2013-10-18 LAB — GLUCOSE, CAPILLARY
Glucose-Capillary: 159 mg/dL — ABNORMAL HIGH (ref 70–99)
Glucose-Capillary: 155 mg/dL — ABNORMAL HIGH (ref 70–99)

## 2013-10-18 NOTE — Progress Notes (Signed)
Subjective/Complaints: Slept better per mom Tolerating PT/OT from pain standpoint Bowel and bladder function is good  Review of Systems - Negative except Left thigh pain Objective: Vital Signs: Blood pressure 92/59, pulse 103, temperature 98.6 F (37 C), temperature source Oral, resp. rate 16, height 5\' 3"  (1.6 m), weight 151 kg (332 lb 14.3 oz), SpO2 93.00%. No results found. Results for orders placed during the hospital encounter of 10/09/13 (from the past 72 hour(s))  GLUCOSE, CAPILLARY     Status: Abnormal   Collection Time    10/15/13 11:28 AM      Result Value Range   Glucose-Capillary 166 (*) 70 - 99 mg/dL  GLUCOSE, CAPILLARY     Status: Abnormal   Collection Time    10/15/13  5:42 PM      Result Value Range   Glucose-Capillary 140 (*) 70 - 99 mg/dL  GLUCOSE, CAPILLARY     Status: Abnormal   Collection Time    10/15/13  8:33 PM      Result Value Range   Glucose-Capillary 136 (*) 70 - 99 mg/dL   Comment 1 Notify RN    CREATININE, SERUM     Status: None   Collection Time    10/16/13  5:30 AM      Result Value Range   Creatinine, Ser 0.74  0.50 - 1.10 mg/dL   GFR calc non Af Amer >90  >90 mL/min   GFR calc Af Amer >90  >90 mL/min   Comment: (NOTE)     The eGFR has been calculated using the CKD EPI equation.     This calculation has not been validated in all clinical situations.     eGFR's persistently <90 mL/min signify possible Chronic Kidney     Disease.  GLUCOSE, CAPILLARY     Status: Abnormal   Collection Time    10/16/13 12:09 PM      Result Value Range   Glucose-Capillary 149 (*) 70 - 99 mg/dL  URINALYSIS, ROUTINE W REFLEX MICROSCOPIC     Status: Abnormal   Collection Time    10/16/13 12:24 PM      Result Value Range   Color, Urine YELLOW  YELLOW   APPearance HAZY (*) CLEAR   Specific Gravity, Urine 1.016  1.005 - 1.030   pH 5.5  5.0 - 8.0   Glucose, UA NEGATIVE  NEGATIVE mg/dL   Hgb urine dipstick NEGATIVE  NEGATIVE   Bilirubin Urine NEGATIVE   NEGATIVE   Ketones, ur NEGATIVE  NEGATIVE mg/dL   Protein, ur NEGATIVE  NEGATIVE mg/dL   Urobilinogen, UA 0.2  0.0 - 1.0 mg/dL   Nitrite NEGATIVE  NEGATIVE   Leukocytes, UA MODERATE (*) NEGATIVE  URINE CULTURE     Status: None   Collection Time    10/16/13 12:24 PM      Result Value Range   Specimen Description URINE, CLEAN CATCH     Special Requests NONE     Culture  Setup Time       Value: 10/16/2013 13:06     Performed at [facility] Count       Value: NO GROWTH     Performed at [facility]   Culture       Value: NO GROWTH     Performed at [facility]   Report Status 10/17/2013 FINAL    URINE MICROSCOPIC-ADD ON     Status: Abnormal   Collection Time    10/16/13  12:24 PM      Result Value Range   Squamous Epithelial / LPF FEW (*) RARE   WBC, UA 11-20  <3 WBC/hpf   Bacteria, UA FEW (*) RARE  BASIC METABOLIC PANEL     Status: Abnormal   Collection Time    10/16/13  1:50 PM      Result Value Range   Sodium 137  135 - 145 mEq/L   Potassium 4.3  3.5 - 5.1 mEq/L   Chloride 95 (*) 96 - 112 mEq/L   CO2 26  19 - 32 mEq/L   Glucose, Bld 162 (*) 70 - 99 mg/dL   BUN 10  6 - 23 mg/dL   Creatinine, Ser 1.61  0.50 - 1.10 mg/dL   Calcium 9.9  8.4 - 09.6 mg/dL   GFR calc non Af Amer >90  >90 mL/min   GFR calc Af Amer >90  >90 mL/min   Comment: (NOTE)     The eGFR has been calculated using the CKD EPI equation.     This calculation has not been validated in all clinical situations.     eGFR's persistently <90 mL/min signify possible Chronic Kidney     Disease.  GLUCOSE, CAPILLARY     Status: Abnormal   Collection Time    10/16/13  4:35 PM      Result Value Range   Glucose-Capillary 134 (*) 70 - 99 mg/dL   Comment 1 Notify RN    GLUCOSE, CAPILLARY     Status: Abnormal   Collection Time    10/16/13  8:42 PM      Result Value Range   Glucose-Capillary 172 (*) 70 - 99 mg/dL  GLUCOSE, CAPILLARY     Status: Abnormal   Collection Time     10/17/13  7:36 AM      Result Value Range   Glucose-Capillary 151 (*) 70 - 99 mg/dL   Comment 1 Notify RN    GLUCOSE, CAPILLARY     Status: Abnormal   Collection Time    10/17/13 12:01 PM      Result Value Range   Glucose-Capillary 136 (*) 70 - 99 mg/dL   Comment 1 Notify RN    GLUCOSE, CAPILLARY     Status: Abnormal   Collection Time    10/17/13  4:54 PM      Result Value Range   Glucose-Capillary 136 (*) 70 - 99 mg/dL   Comment 1 Notify RN    GLUCOSE, CAPILLARY     Status: Abnormal   Collection Time    10/17/13  9:04 PM      Result Value Range   Glucose-Capillary 146 (*) 70 - 99 mg/dL   Comment 1 Notify RN    GLUCOSE, CAPILLARY     Status: Abnormal   Collection Time    10/18/13  7:38 AM      Result Value Range   Glucose-Capillary 159 (*) 70 - 99 mg/dL   Comment 1 Notify RN       HEENT: normal Cardio: RRR and no murmur Resp: CTA B/L and unlabored GI: BS positive and NT Extremity:  Pulses positive and No Edema Skin:   Intact and Wound C/D/I and groin, distal thigh Neuro: Alert/Oriented, Normal Sensory and Abnormal Motor 3-/5 L KE, 2- L HF, 3/5 L ankle DF/PF, 5/5 in BUE and RLE Musc/Skel:  Swelling Left thigh Gen NAD   Assessment/Plan: 1. Functional deficits secondary to Left femoral shaft fx which require 3+ hours per day  of interdisciplinary therapy in a comprehensive inpatient rehab setting. Physiatrist is providing close team supervision and 24 hour management of active medical problems listed below. Physiatrist and rehab team continue to assess barriers to discharge/monitor patient progress toward functional and medical goals. Team conference today please see physician documentation under team conference tab, met with team face-to-face to discuss problems,progress, and goals. Formulized individual treatment plan based on medical history, underlying problem and comorbidities. FIM: FIM - Bathing Bathing Steps Patient Completed: Chest;Right Arm;Left Arm;Abdomen;Right  upper leg;Left upper leg;Left lower leg (including foot);Right lower leg (including foot);Buttocks Bathing: 4: Min-Patient completes 8-9 76f 10 parts or 75+ percent  FIM - Upper Body Dressing/Undressing Upper body dressing/undressing steps patient completed: Thread/unthread right bra strap;Thread/unthread left bra strap;Hook/unhook bra;Thread/unthread right sleeve of pullover shirt/dresss;Thread/unthread left sleeve of pullover shirt/dress;Put head through opening of pull over shirt/dress;Pull shirt over trunk Upper body dressing/undressing: 5: Set-up assist to: Obtain clothing/put away FIM - Lower Body Dressing/Undressing Lower body dressing/undressing steps patient completed: Thread/unthread right underwear leg;Thread/unthread left underwear leg;Thread/unthread right pants leg;Thread/unthread left pants leg;Don/Doff right sock;Don/Doff left sock;Pull pants up/down;Pull underwear up/down Lower body dressing/undressing: 5: Set-up assist to: Obtain clothing  FIM - Toileting Toileting steps completed by patient: Adjust clothing after toileting Toileting: 0: Activity did not occur  FIM - Diplomatic Services operational officer Devices: Building control surveyor Transfers: 4-To toilet/BSC: Min A (steadying Pt. > 75%);4-From toilet/BSC: Min A (steadying Pt. > 75%)  FIM - Bed/Chair Transfer Bed/Chair Transfer Assistive Devices: Therapist, occupational: 4: Chair or W/C > Bed: Min A (steadying Pt. > 75%);4: Bed > Chair or W/C: Min A (steadying Pt. > 75%)  FIM - Locomotion: Wheelchair Distance: 150 Locomotion: Wheelchair: 5: Travels 150 ft or more: maneuvers on rugs and over door sills with supervision, cueing or coaxing FIM - Locomotion: Ambulation Locomotion: Ambulation Assistive Devices: Designer, industrial/product Ambulation/Gait Assistance: 4: Min guard Locomotion: Ambulation: 1: Travels less than 50 ft with minimal assistance (Pt.>75%)  Comprehension Comprehension Mode:  Auditory Comprehension: 5-Understands basic 90% of the time/requires cueing < 10% of the time  Expression Expression Mode: Verbal Expression: 5-Expresses basic 90% of the time/requires cueing < 10% of the time.  Social Interaction Social Interaction: 4-Interacts appropriately 75 - 89% of the time - Needs redirection for appropriate language or to initiate interaction.  Problem Solving Problem Solving: 4-Solves basic 75 - 89% of the time/requires cueing 10 - 24% of the time  Memory Memory: 4-Recognizes or recalls 75 - 89% of the time/requires cueing 10 - 24% of the time  Medical Problem List and Plan:  1. DVT Prophylaxis/Anticoagulation: Pharmaceutical: Lovenox  2. Pain Management: prn medications ,hs Oxycontin , schedule muscle relaxant 3. 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 Will have LCSW follow for evaluation.  4. Neuropsych: This patient is capable of making decisions on her own behalf.  5. DM type 2: Will monitor BS with ac/hs checks. Resume Amaryl 4 mg bid--anticipate BS to run high with stress. Will use SSI and meal coverage to keep BS controlled. Wean off levemir as Hgb A1c (7.6) --indicates reasonable control  6. Hypothyroid: Resume supplement.  7. Leucocytosis: Likely reactive.  8. Urinary retention: Will discontinue foley and start voiding trial. Get patient to [facility] to void.  9. Hypoxia: improved 10 Tachycardia: likely due to deconditioning. Monitor heart rate with bid checks. Monitor for symptoms with increase in activity.    LOS (Days) 9 A FACE TO FACE EVALUATION WAS PERFORMED  KIRSTEINS,ANDREW E 10/18/2013, 9:49 AM

## 2013-10-18 NOTE — Progress Notes (Signed)
Occupational Therapy Session Note  Patient Details  Name: Alexandra Kelley MRN: [id number] Date of Birth: [birth year]  Today's Date: 10/18/2013 Time: 1027-2536 and 1400-1430 Time Calculation (min): 60 min and 30 min   Short Term Goals: Week 1:  OT Short Term Goal 1 (Week 1): Focus on LTGs seondary to short ELOS  Skilled Therapeutic Interventions/Progress Updates:    Session 1: Pt seen for ADL retraining with focus on activity tolerance, functional transfers, family education, and standing balance. Completed bathing at sink with min assist for peri hygiene and supervision for standing balance and sit<>stand. Pt required cues for encouragement throughout session secondary to fear of pain. Pt used AE without cues and slightly less time. Completed tub transfer using TTB once with this therapist and another with mother. Pt completes stand pivot transfer to/from TTB with supervision and min assist to manage LLE into tub. At end of session pt left sitting in w/c with mother present. Mother with no questions or concerns about d/c this Friday.   Session 2: Therapy session focused on activity tolerance, functional transfers, and UE strengthening to assist with standing balance and transfers. Pt ambulated approx 5 feet with RW at supervision level w/c<>toilet requiring max cues of encouragement. Completed toilet task with min assist for hygiene and supervision for standing balance as pt managed clothing up/down. Completed 1 set of 15 reps of HEP using theraband and min verbal cues to complete correctly. Pt left sitting in w/c with ice pack applied to LLE. All items in reach.   Therapy Documentation Precautions:  Precautions Precautions: Fall Restrictions Weight Bearing Restrictions: Yes LLE Weight Bearing: Partial weight bearing LLE Partial Weight Bearing Percentage or Pounds: 50% General:   Vital Signs:   Pain: Pain Assessment Pain Score: 5   See FIM for current functional  status  Therapy/Group: Individual Therapy  Daneil Dan 10/18/2013, 10:40 AM

## 2013-10-18 NOTE — Patient Care Conference (Signed)
Inpatient RehabilitationTeam Conference and Plan of Care Update Date: 10/18/2013   Time: 10;40 Am    Patient Name: Alexandra Kelley      Medical Record Number: [id number]  Date of Birth: Mar 20, 1981 Sex: Female         Room/Bed: 4W10C/4W10C-01 Payor Info: Payor: MEDICAID [place] / Plan: MEDICAID [place] ACCESS / Product Type: *No Product type* /    Admitting Diagnosis: L FEMORAL SHAFT FRACTURE  Admit Date/Time:  10/09/2013  4:08 PM Admission Comments: No comment available   Primary Diagnosis:  <principal problem not specified> Principal Problem: <principal problem not specified>  Patient Active Problem List   Diagnosis Date Noted  . Femur fracture, left 10/09/2013  . Acute urinary retention 10/04/2013  . Acute-on-chronic respiratory failure 10/04/2013  . Femur fracture 10/03/2013  . Closed displaced oblique fracture of shaft of left femur 10/03/2013  . Hypothyroidism 10/03/2013  . Diabetes 10/03/2013    Expected Discharge Date: Expected Discharge Date: 10/20/13  Team Members Present: Physician leading conference: Dr. Claudette Laws Social Worker Present: Dossie Der, LCSW Nurse Present: Other (comment) (KOm Avegro-RN) PT Present: Wanda Plump, PT OT Present: Scherrie November, OT SLP Present: Maxcine Ham, SLP PPS Coordinator present : Edson Snowball, Chapman Fitch, RN, CRRN     Current Status/Progress Goal Weekly Team Focus  Medical   Fear of falling, left midshaft femur fracture healing well, pain control overall improved  Decreased fear of movement  Patient and family education   Bowel/Bladder   Patient continent of B&B  Will remain continent of bowel and bladder  Remember PWB status when transferring to [facility] or toilet   Swallow/Nutrition/ Hydration     Regular diet        ADL's   supervision LB dressing with increased time using AE, min assist bathing, min assist transfers, mod assist toileting, setup assist UB dressing  supervision toilet transfer and LB  dressing, independent UB dressing, mod assist toilet task, min assist bathing and tub transfer  activity tolerance, functional transfers, dynamic standing balance, strengthening, education    Mobility   gait x22' with min guard, bed<>w/c with supervision to min A, w/c mobility x150' with supervision  Gait x25' with supervision, bed<>w/c with mod I, w/c mobility with mod I  functional endurance, standing/walking tolerance, family training, negotiation of single step with rolling walker   Communication     [facility]        Safety/Cognition/ Behavioral Observations    NO issues        Pain   Pain has been rated 10 duuring the night. Ultram 50mg  adm at 0213, Oxy 5mg  adm at 0436. Slept in between meds.  Pain managed at or below 2  Assess pain every 2-4 hours and reassess after pain interventions   Skin   1 incision site to left knee, 4 small incision sites to outer and inner aspect of left  knee, 2 small incision sites to left groin  To have no skin breakdown or infection  Educate patient on the importance of skin care. Assess skin for breakdown and infection every shift.      *See Care Plan and progress notes for long and short-term goals.  Barriers to Discharge: See above    Possible Resolutions to Barriers:  See above    Discharge Planning/Teaching Needs:  Home with parents, Mom taking FMLA.  Trying to get follow up PT Medicaid will not pay for this due to pt's diagnosis      Team Discussion:  Needs lots  of encouragement to stand and try to walk.  Pain still an issue and MD adjusting meds.  Endurance poor working on.  Mom has been here to attend therapies with pt.  Revisions to Treatment Plan:  none   Continued Need for Acute Rehabilitation Level of Care: The patient requires daily medical management by a physician with specialized training in physical medicine and rehabilitation for the following conditions: Daily direction of a multidisciplinary physical rehabilitation program to ensure  safe treatment while eliciting the highest outcome that is of practical value to the patient.: Yes Daily medical management of patient stability for increased activity during participation in an intensive rehabilitation regime.: Yes Daily analysis of laboratory values and/or radiology reports with any subsequent need for medication adjustment of medical intervention for : Post surgical problems;Other  Lucy Chris 10/20/2013, 8:54 AM

## 2013-10-18 NOTE — Progress Notes (Signed)
Physical Therapy Weekly Progress Note  Patient Details  Name: Alexandra Kelley MRN: [id number] Date of Birth: 1981-05-19  Today's Date: 10/18/2013 Time: 1191-4782 and 9562-1308 Time Calculation (min):38 min and 60 min  Patient has met 4 of 4 short term goals.   Patient continues to demonstrate the following deficits: limited endurance, decreased standing/walking tolerance, increased LLE pain, and decreased LLE strength and therefore will continue to benefit from skilled PT intervention to enhance overall performance with activity tolerance, functional use of  left lower extremity and knowledge of precautions.  Patient progressing toward long term goals. However, progress has been slower than expected. Long term goal for bed<>chair transfer modified to supervision secondary to pt requiring cues for initiation. Long term goal for ambulation in controlled environment modified to 25' due to limited standing/walking tolerance. Goals added to address w/c mobility in home and controlled environments.  PT Short Term Goals Week 1:  PT Short Term Goal 1 (Week 1): Pt to perform supine<>sit transfer with consistent min A with HOB flat. PT Short Term Goal 1 - Progress (Week 1): Met PT Short Term Goal 2 (Week 1): Pt to perform bed<>chair transfer with consistent min A, min cueing to adhere to LLE PWB. PT Short Term Goal 2 - Progress (Week 1): Met PT Short Term Goal 3 (Week 1): Pt to ambulate 25' with LRAD requiring min A, min cueing to adhere to LLE PWB. PT Short Term Goal 3 - Progress (Week 1): Met PT Short Term Goal 4 (Week 1): Pt to perform self-propulsion of w/c x150' without rest break for functional endurance. PT Short Term Goal 4 - Progress (Week 1): Met PT Short Term Goal 5 (Week 1): Pt to negotiate single step without rails using LRAD with mod A. PT Short Term Goal 5 - Progress (Week 1): Met Week 2:  PT Short Term Goal 1 (Week 2): STG's = LTG's secondary to ELOS  Skilled Therapeutic  Interventions/Progress Updates:    Treatment Session 1: Pt received seated in w/c; agreeable to therapy. Session focused on negotiation of single step (to simulate entrance of pt's home) and car transfers. Stand-pivot from w/c<>bedside commode with rolling walker and supervision, cues for hand placement with sit<>stand. W/c mobility 2x150' in controlled and home environments using bilat UE's with mod I. Car transfer with rolling walker requiring min A for LLE management, increased time. Negotiation of single 6" step with rolling walker requiring min A, verbal cueing for sequencing, and max encouragement. Mat table<>w/c with rolling walker and supervision. Cold pack with compression applied to L distal femur during final 20 minutes of session to address pt-reported pain. Session ended in pt room, where pt was left seated in w/c with LLE elevated and all needs within reach.  Treatment Session 2: Pt received seated in w/c; agreeable to session. Session focused on dynamic standing balance and gait in controlled and home environments. Stand-pivot from w/c<>bedside commode with rolling walker and supervision. W/c mobility x150' in controlled and home environments using bilat UE's with mod I. Gait x36' with rolling walker in home (carpeted) environment with rolling walker and supervision. Gait x28' with rolling walker in controlled environment with supervision/cueing to adhere to weightbearing precautions. Dynamic standing balance x5 min, 15 sec with intermittent UE support at countertop requiring supervision/cueing for adherence to LLE PWB. Cold pack with compression applied to L distal femur during final 20 minutes of session to address pt-reported pain. W/c mobility x150' in controlled environment with mod I to return to room, where  session ended. Therapist departed with pt seated in w/c with LLE elevated and all needs within reach.  Therapy Documentation Precautions:  Precautions Precautions:  Fall Restrictions Weight Bearing Restrictions: Yes LLE Weight Bearing: Partial weight bearing LLE Partial Weight Bearing Percentage or Pounds: 50% General: Amount of Missed PT Time (min): 7 Minutes Missed Time Reason: Patient fatigue Pain: Pain Assessment Pain Assessment: 0-10 Pain Score: 5  Pain Type: Surgical pain Pain Location: Leg Pain Orientation: Left Pain Descriptors / Indicators: Aching Pain Onset: On-going Patients Stated Pain Goal: 0 Pain Intervention(s): Medication (See eMAR) Multiple Pain Sites: No Locomotion : Ambulation Ambulation/Gait Assistance: 5: Supervision Wheelchair Mobility Distance: 150   See FIM for current functional status  Therapy/Group: Individual Therapy  Rishikesh Khachatryan, Lorenda Ishihara 10/18/2013, 5:29 PM

## 2013-10-18 NOTE — Progress Notes (Signed)
Patient still continues to refuse CPAP machine. RT will continue to assist as needed.

## 2013-10-19 ENCOUNTER — Inpatient Hospital Stay (HOSPITAL_COMMUNITY): Payer: Medicaid Other

## 2013-10-19 ENCOUNTER — Inpatient Hospital Stay (HOSPITAL_COMMUNITY): Payer: Medicaid Other | Admitting: Physical Therapy

## 2013-10-19 LAB — GLUCOSE, CAPILLARY
Glucose-Capillary: 155 mg/dL — ABNORMAL HIGH (ref 70–99)
Glucose-Capillary: 116 mg/dL — ABNORMAL HIGH (ref 70–99)
Glucose-Capillary: 116 mg/dL — ABNORMAL HIGH (ref 70–99)
Glucose-Capillary: 175 mg/dL — ABNORMAL HIGH (ref 70–99)

## 2013-10-19 MED ORDER — TRAMADOL HCL 50 MG PO TABS
50.0000 mg | ORAL_TABLET | Freq: Every day | ORAL | Status: DC
  Administered 2013-10-19: 50 mg via ORAL
  Filled 2013-10-19: qty 1

## 2013-10-19 MED ORDER — METFORMIN HCL 500 MG PO TABS
500.0000 mg | ORAL_TABLET | Freq: Every day | ORAL | Status: DC
  Administered 2013-10-19 – 2013-10-20 (×2): 500 mg via ORAL
  Filled 2013-10-19 (×3): qty 1

## 2013-10-19 NOTE — Progress Notes (Signed)
Recreational Therapy Discharge Summary Patient Details  Name: Alexandra Kelley MRN: [id number] Date of Birth: 04/09/1981 Today's Date: 10/19/2013  Long term goals set: 1  Long term goals met: 1  Comments on progress toward goals: Pt has made great progress toward goal and is discharging home with family to provide 24 hours supervision/assist.  Pt does require verbal cuing for encouragement.  Family has been present & participatory in therapy sessions. Reasons for discharge: discharge from hospital  Patient/family agrees with progress made and goals achieved: Yes  Alexandra Kelley 10/19/2013, 4:46 PM

## 2013-10-19 NOTE — Progress Notes (Signed)
Subjective/Complaints: Slept poorly per mom Tolerating PT/OT from pain standpoint Bowel and bladder function is good  Review of Systems - Negative except Left thigh pain Objective: Vital Signs: Blood pressure 111/45, pulse 100, temperature 98.1 F (36.7 C), temperature source Oral, resp. rate 19, height 5\' 3"  (1.6 m), weight 142.7 kg (314 lb 9.5 oz), SpO2 97.00%. No results found. Results for orders placed during the hospital encounter of 10/09/13 (from the past 72 hour(s))  GLUCOSE, CAPILLARY     Status: Abnormal   Collection Time    10/16/13 12:09 PM      Result Value Range   Glucose-Capillary 149 (*) 70 - 99 mg/dL  URINALYSIS, ROUTINE W REFLEX MICROSCOPIC     Status: Abnormal   Collection Time    10/16/13 12:24 PM      Result Value Range   Color, Urine YELLOW  YELLOW   APPearance HAZY (*) CLEAR   Specific Gravity, Urine 1.016  1.005 - 1.030   pH 5.5  5.0 - 8.0   Glucose, UA NEGATIVE  NEGATIVE mg/dL   Hgb urine dipstick NEGATIVE  NEGATIVE   Bilirubin Urine NEGATIVE  NEGATIVE   Ketones, ur NEGATIVE  NEGATIVE mg/dL   Protein, ur NEGATIVE  NEGATIVE mg/dL   Urobilinogen, UA 0.2  0.0 - 1.0 mg/dL   Nitrite NEGATIVE  NEGATIVE   Leukocytes, UA MODERATE (*) NEGATIVE  URINE CULTURE     Status: None   Collection Time    10/16/13 12:24 PM      Result Value Range   Specimen Description URINE, CLEAN CATCH     Special Requests NONE     Culture  Setup Time       Value: 10/16/2013 13:06     Performed at [facility] Count       Value: NO GROWTH     Performed at [facility]   Culture       Value: NO GROWTH     Performed at [facility]   Report Status 10/17/2013 FINAL    URINE MICROSCOPIC-ADD ON     Status: Abnormal   Collection Time    10/16/13 12:24 PM      Result Value Range   Squamous Epithelial / LPF FEW (*) RARE   WBC, UA 11-20  <3 WBC/hpf   Bacteria, UA FEW (*) RARE  BASIC METABOLIC PANEL     Status: Abnormal   Collection Time   10/16/13  1:50 PM      Result Value Range   Sodium 137  135 - 145 mEq/L   Potassium 4.3  3.5 - 5.1 mEq/L   Chloride 95 (*) 96 - 112 mEq/L   CO2 26  19 - 32 mEq/L   Glucose, Bld 162 (*) 70 - 99 mg/dL   BUN 10  6 - 23 mg/dL   Creatinine, Ser 7.82  0.50 - 1.10 mg/dL   Calcium 9.9  8.4 - 95.6 mg/dL   GFR calc non Af Amer >90  >90 mL/min   GFR calc Af Amer >90  >90 mL/min   Comment: (NOTE)     The eGFR has been calculated using the CKD EPI equation.     This calculation has not been validated in all clinical situations.     eGFR's persistently <90 mL/min signify possible Chronic Kidney     Disease.  GLUCOSE, CAPILLARY     Status: Abnormal   Collection Time    10/16/13  4:35 PM  Result Value Range   Glucose-Capillary 134 (*) 70 - 99 mg/dL   Comment 1 Notify RN    GLUCOSE, CAPILLARY     Status: Abnormal   Collection Time    10/16/13  8:42 PM      Result Value Range   Glucose-Capillary 172 (*) 70 - 99 mg/dL  GLUCOSE, CAPILLARY     Status: Abnormal   Collection Time    10/17/13  7:36 AM      Result Value Range   Glucose-Capillary 151 (*) 70 - 99 mg/dL   Comment 1 Notify RN    GLUCOSE, CAPILLARY     Status: Abnormal   Collection Time    10/17/13 12:01 PM      Result Value Range   Glucose-Capillary 136 (*) 70 - 99 mg/dL   Comment 1 Notify RN    GLUCOSE, CAPILLARY     Status: Abnormal   Collection Time    10/17/13  4:54 PM      Result Value Range   Glucose-Capillary 136 (*) 70 - 99 mg/dL   Comment 1 Notify RN    GLUCOSE, CAPILLARY     Status: Abnormal   Collection Time    10/17/13  9:04 PM      Result Value Range   Glucose-Capillary 146 (*) 70 - 99 mg/dL   Comment 1 Notify RN    GLUCOSE, CAPILLARY     Status: Abnormal   Collection Time    10/18/13  7:38 AM      Result Value Range   Glucose-Capillary 159 (*) 70 - 99 mg/dL   Comment 1 Notify RN    GLUCOSE, CAPILLARY     Status: Abnormal   Collection Time    10/18/13 11:49 AM      Result Value Range    Glucose-Capillary 155 (*) 70 - 99 mg/dL   Comment 1 Notify RN    GLUCOSE, CAPILLARY     Status: Abnormal   Collection Time    10/18/13  5:06 PM      Result Value Range   Glucose-Capillary 201 (*) 70 - 99 mg/dL   Comment 1 Notify RN    GLUCOSE, CAPILLARY     Status: Abnormal   Collection Time    10/18/13  9:31 PM      Result Value Range   Glucose-Capillary 202 (*) 70 - 99 mg/dL  GLUCOSE, CAPILLARY     Status: Abnormal   Collection Time    10/19/13  7:29 AM      Result Value Range   Glucose-Capillary 155 (*) 70 - 99 mg/dL   Comment 1 Notify RN       HEENT: normal Cardio: RRR and no murmur Resp: CTA B/L and unlabored GI: BS positive and NT Extremity:  Pulses positive and No Edema Skin:   Intact and Wound C/D/I and groin, distal thigh Neuro: Alert/Oriented, Normal Sensory and Abnormal Motor 3-/5 L KE, 2- L HF, 3/5 L ankle DF/PF, 5/5 in BUE and RLE Musc/Skel:  Swelling Left thigh Gen NAD   Assessment/Plan: 1. Functional deficits secondary to Left femoral shaft fx which require 3+ hours per day of interdisciplinary therapy in a comprehensive inpatient rehab setting. Physiatrist is providing close team supervision and 24 hour management of active medical problems listed below. Physiatrist and rehab team continue to assess barriers to discharge/monitor patient progress toward functional and medical goals.  FIM: FIM - Bathing Bathing Steps Patient Completed: Chest;Right Arm;Left Arm;Abdomen;Right upper leg;Left upper leg;Left lower leg (including  foot);Right lower leg (including foot) Bathing: 4: Min-Patient completes 8-9 4f 10 parts or 75+ percent  FIM - Upper Body Dressing/Undressing Upper body dressing/undressing steps patient completed: Thread/unthread right bra strap;Thread/unthread left bra strap;Hook/unhook bra;Thread/unthread right sleeve of pullover shirt/dresss;Thread/unthread left sleeve of pullover shirt/dress;Put head through opening of pull over shirt/dress;Pull shirt  over trunk Upper body dressing/undressing: 5: Set-up assist to: Obtain clothing/put away FIM - Lower Body Dressing/Undressing Lower body dressing/undressing steps patient completed: Thread/unthread right underwear leg;Thread/unthread left underwear leg;Thread/unthread right pants leg;Thread/unthread left pants leg;Don/Doff right sock;Don/Doff left sock;Pull pants up/down;Pull underwear up/down Lower body dressing/undressing: 5: Set-up assist to: Obtain clothing  FIM - Toileting Toileting steps completed by patient: Adjust clothing prior to toileting;Adjust clothing after toileting Toileting: 3: Mod-Patient completed 2 of 3 steps  FIM - Archivist Transfers Assistive Devices: Art gallery manager Transfers: 5-To toilet/BSC: Supervision (verbal cues/safety issues);5-From toilet/BSC: Supervision (verbal cues/safety issues)  FIM - Banker Devices: Walker;Arm rests Bed/Chair Transfer: 5: Bed > Chair or W/C: Supervision (verbal cues/safety issues);5: Chair or W/C > Bed: Supervision (verbal cues/safety issues)  FIM - Locomotion: Wheelchair Distance: 150 Locomotion: Wheelchair: 6: Travels 150 ft or more, turns around, maneuvers to table, bed or toilet, negotiates 3% grade: maneuvers on rugs and over door sills independently FIM - Locomotion: Ambulation Locomotion: Ambulation Assistive Devices: Designer, industrial/product Ambulation/Gait Assistance: 5: Supervision Locomotion: Ambulation: 1: Travels less than 50 ft with supervision/safety issues  Comprehension Comprehension Mode: Auditory Comprehension: 5-Understands basic 90% of the time/requires cueing < 10% of the time  Expression Expression Mode: Verbal Expression: 5-Expresses basic 90% of the time/requires cueing < 10% of the time.  Social Interaction Social Interaction: 4-Interacts appropriately 75 - 89% of the time - Needs redirection for appropriate language or to initiate interaction.  Problem  Solving Problem Solving: 4-Solves basic 75 - 89% of the time/requires cueing 10 - 24% of the time  Memory Memory: 4-Recognizes or recalls 75 - 89% of the time/requires cueing 10 - 24% of the time  Medical Problem List and Plan:  1. DVT Prophylaxis/Anticoagulation: Pharmaceutical: Lovenox  2. Pain Management: prn medications ,hs Oxycontin ,on scheduled muscle relaxant 3. 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 Will have LCSW follow for evaluation.  4. Neuropsych: This patient is capable of making decisions on her own behalf.  5. DM type 2: Will monitor BS with ac/hs checks. Resumed Amaryl 4 mg bid, add metformin--anticipate BS to run high with stress. Will use SSI and meal coverage to keep BS controlled. Wean off levemir as Hgb A1c (7.6) --indicates reasonable control  6. Hypothyroid: Resume supplement.  7. Leucocytosis: Likely reactive.  8. Urinary retention:resolved 9. Hypoxia: improved 10 Tachycardia: likely due to deconditioning. Monitor heart rate with bid checks. Monitor for symptoms with increase in activity.    LOS (Days) 10 A FACE TO FACE EVALUATION WAS PERFORMED  Dhilan Brauer E 10/19/2013, 8:57 AM

## 2013-10-19 NOTE — Progress Notes (Signed)
Physical Therapy Note  Patient Details  Name: Alexandra Kelley MRN: [id number] Date of Birth: [birth year] Today's Date: 10/19/2013  Time: 1015-1100 45 minutes  1:1 No c/o pain.  Treatment focused on pt/family education with pt's mother. Pt/mother safely demo'd bed mobility at min A level, 1 step to enter home with min A.  Gait and transfers with mod I.  Pt/mother discussed car transfer with PT and pt was able to verbalize safety and sequencing for car transfer.  Pt and mother report they feel comfortable with pt to d/c home at this level of care.  Time 2: 1500-1540 40 minutes  1:1 no c/o pain.  Gait training with supervision with RW multiple attempts, pt able to gait 30', 32', 35'.  Fatigue limits ability to increase distance with gait.  W/c mobility with mod I throughout unit.  Pt rec'd new w/c and PT discussed parts management and set up with pt, she expresses understanding.  Reviewed HEP for supine therex for LE strengthening and pt given handout for home use.   [name] 10/19/2013, 10:58 AM

## 2013-10-19 NOTE — Discharge Summary (Signed)
Physician Discharge Summary  Patient ID: Alexandra Kelley MRN: [id number] DOB/AGE: [birth year] 32 y.o.  Admit date: 10/09/2013 Discharge date: 10/20/2013  Discharge Diagnoses:  Active Problems:   Hypothyroidism   Diabetes   Femur fracture, left   Discharged Condition:  Stable   Significant Diagnostic Studies: Dg Chest 1 View  10/03/2013   CLINICAL DATA:  Fall, leg pain  EXAM: CHEST - 1 VIEW  COMPARISON:  None.  FINDINGS: Hypoaeration and portable technique. Cardiomediastinal contours are likely within normal range allowing for this. No common airspace opacity, pleural effusion, or pneumothorax. No acute osseous finding.  IMPRESSION: Hypoaeration.  No acute process identified.   Electronically Signed   By: Jearld Lesch M.D.   On: 10/03/2013 01:27    Labs:  Basic Metabolic Panel:  Recent Labs Lab 10/16/13 0530 10/16/13 1350  NA  --  137  K  --  4.3  CL  --  95*  CO2  --  26  GLUCOSE  --  162*  BUN  --  10  CREATININE 0.74 0.76  CALCIUM  --  9.9    CBC: CBC    Component Value Date/Time   WBC 13.9* 10/10/2013 0550   RBC 3.81* 10/10/2013 0550   HGB 11.2* 10/10/2013 0550   HCT 34.7* 10/10/2013 0550   PLT 316 10/10/2013 0550   MCV 91.1 10/10/2013 0550   MCH 29.4 10/10/2013 0550   MCHC 32.3 10/10/2013 0550   RDW 15.2 10/10/2013 0550   LYMPHSABS 3.2 10/10/2013 0550   MONOABS 0.7 10/10/2013 0550   EOSABS 0.5 10/10/2013 0550   BASOSABS 0.1 10/10/2013 0550     CBG:  Recent Labs Lab 10/19/13 1151 10/19/13 1711 10/19/13 2110 10/20/13 0747 10/20/13 1122  GLUCAP 116* 116* 175* 123* 133*   Brief HPI:   Alexandra Kelley is a 32 y.o. female with history of DM, thyroid disease, morbid obesity, who was admitted on 10/03/13 past fall and subsequent closed displaced oblique fracture of shaft of left femur. Mechanical fall that occurred due to patient tripping when she got up from dinner table. Patient with hypoxia and CT chest negative for PE. She was evaluated by Dr. Ophelia Charter and  underwent IM nailing for repair on the same day. Post op PWB and on lovenox for DVT prophylaxis. She has had hypoxia requiring oxygen with activity as well as urinary retention. CIR was recommended by  therapy team.   Hospital Course: Alexandra Kelley was admitted to rehab 10/09/2013 for inpatient therapies to consist of PT, ST and OT at least three hours five days a week. Past admission physiatrist, therapy team and rehab RN have worked together to provide customized collaborative inpatient rehab. Foley was removed past admission and patient has been voiding without difficulty. Follow up labs showed leucocytosis. UCS 12/08 and 12/15 was negative. Wound has been healing well without signs or symptoms of infection. Diabetes was monitored with ac/hs cbg checks. Amaryl was resumed and lantus was discontinued. BS remained elevated therefore metformin was added on 12/18 but patient reports h/o GI intolerance with this in the past. She was advised to monitor for SE and check BS bid-tid basis. She is also intolerant of Onglyza and Invokana.  She was give Rx for Novolog 5 units to be used  tid ac if BS over 180 and if unable to tolerate metformin.   She has been limited by [facility] as well as pain. She has had insomnia due to LE spasms at night. Pain medications were titrated to  help with symptom management and she has been advised on taper past discharge.   She is to use ASA 325 mg bid for a month for DVT prophylaxis and follow up with Dr. Lurene Shadow for further input on BS management. She has been motivated and has progressed to independent level at [facility] level.  Rehab course: During patient's stay in rehab weekly team conferences were held to monitor patient's progress, set goals and discuss barriers to discharge. Patient has had improvement in activity tolerance, balance, postural control, as well as ability to compensate for deficits.  She is independent for transfers and ambulating 25-35 feet with fatigue limiting  distance. She is able to perform grooming and UB dressing tasks independently. She requires supervision for LB dressing, min assist for bathing and moderate assist for toileting. She has been set up for outpatient therapy at [facility].    Disposition: 01-Home or Self Care  Diet:  Diabetic diet.   Special Instructions: 1. Check blood sugars 2-3 times a day. 2. Left leg weight limitations to continue till cleared by Dr. Ophelia Charter.  3. Resume Prilosec prn.       Medication List    STOP taking these medications       HYDROcodone-acetaminophen 5-325 MG per tablet  Commonly known as:  NORCO/VICODIN     insulin aspart 100 UNIT/ML injection  Commonly known as:  novoLOG     insulin detemir 100 UNIT/ML injection  Commonly known as:  LEVEMIR     metoCLOPramide 5 MG tablet  Commonly known as:  REGLAN      TAKE these medications       amitriptyline 10 MG tablet  Commonly known as:  ELAVIL  Take 1 tablet (10 mg total) by mouth at bedtime. For pain     aspirin 325 MG EC tablet  - Take 1 tablet (325 mg total) by mouth 2 (two) times daily. Take with food.    - Blood thinner for at least a month or till cleared to put full weight on left leg.     bisacodyl 10 MG suppository  Commonly known as:  DULCOLAX  Place 1 suppository (10 mg total) rectally daily as needed for moderate constipation.     diphenhydrAMINE-zinc acetate cream  Commonly known as:  BENADRYL  Apply topically as needed for itching.        fluticasone 50 MCG/ACT nasal spray  Commonly known as:  FLONASE  Place 2 sprays into the nose daily.     glimepiride 4 MG tablet  Commonly known as:  AMARYL  Take 4 mg by mouth 2 (two) times daily.     levothyroxine 137 MCG tablet  Commonly known as:  SYNTHROID, LEVOTHROID  Take 137 mcg by mouth daily.     metFORMIN 500 MG tablet  Commonly known as:  GLUCOPHAGE  Take 1 tablet (500 mg total) by mouth daily with breakfast. New diabetes medication.     methocarbamol  500 MG tablet  Commonly known as:  ROBAXIN  Take 2 tablets (1,000 mg total) by mouth 4 (four) times daily.     OxyCODONE 20 mg T12a 12 hr tablet  Rx # 30 pills  Commonly known as:  OXYCONTIN  - Long acting pain medication. Taper as below:  - Take one pill at  7 am and 7 pm for 10 days.  - Then decrease to one pill per day till gone.     oxyCODONE 5 MG immediate release tablet Rx # 100  Commonly  known as:  Oxy IR/ROXICODONE  Take 1 tablet (5 mg total) by mouth every 4 (four) hours as needed for severe pain or breakthrough pain.     traMADol 50 MG tablet Rx # 90  Commonly known as:  ULTRAM  Take 1 tablet (50 mg total) by mouth every 6 (six) hours as needed for moderate pain. Take one a bedtime daily for a 7-10 days to help you sleep.       Follow-up Information   Call Erick Colace, MD. (As needed)    Specialty:  Physical Medicine and Rehabilitation   Contact information:   [address] [place] [address] [place] [address] [phone]       Follow up with Eldred Manges, MD. Call today. (for follow up appointment)    Specialty:  Orthopedic Surgery   Contact information:   [address] [place] [address] [phone]       Follow up with Colette Ribas, MD On . (MD OFFICE TO CONTACT PT AT HOME TO SET UP APPT)    Specialty:  Family Medicine   Contact information:   [address] [place] [address] [phone]       Signed: Jacquelynn Cree 10/20/2013, 3:33 PM

## 2013-10-19 NOTE — Progress Notes (Signed)
Occupational Therapy Discharge Summary  Patient Details  Name: Alexandra Kelley MRN: [id number] Date of Birth: 1981/10/13  Today's Date: 10/19/2013 Time: 4098-1191 and 1330-1400 Time Calculation (min): 60 min and 30 min   Patient has met 8 of 8 long term goals due to improved activity tolerance, improved balance, functional use of  LEFT lower extremity and improved coordination.  Patient to discharge at overall independent grooming and UB dressing, mod assist toilet task, min assist bathing, supervision LB dressing and transfers level.  Patient's care partner is independent to provide the necessary physical and cognitive assistance at discharge.    Reasons goals not met: N/A. All LTGs met.   Recommendation:  Patient will not required skilled occupational therapy following discharge as patient is discharging at level she was PTA. Patient has received worksheet for HEP and Level 2 theraband. Patient's caregivers have completed this HEP with patient.   Equipment: wide based BSC, TTB  Reasons for discharge: treatment goals met and discharge from hospital  Patient/family agrees with progress made and goals achieved: Yes  Skilled Therapeutic Intervention Session 1: Pt seen for ADL retraining with focus on functional transfers, standing balance, and activity tolerance. Mother present for beginning of therapy session. Pt ambulated with RW approx 6 feet from w/c to shower chair with supervision and mod cues for encouragement. Completed bathing with min assist for hygiene and supervision for standing balance. Required supervision for standing balance during LB dressing. Pt used AE without cues and increased time this session. Pt required rest breaks throughout session secondary to fatigue and pain in LLE. At end of session pt left sitting in w/c with all needs in reach. No questions from pt or mother about discharge at this time.   Session 2: Therapy session focused on functional transfers and  activity tolerance. Pt ambulated approx 7 feet with RW and increased time to toilet with supervision and toilet task with mod assist. Pt then practiced tub transfer with TTB at supervision level. Pt propelled self back to room with one rest break. At end of session pt left sitting in w/c with ice applied to LLE.   OT Discharge Precautions/Restrictions  Precautions Precautions: Fall Restrictions Weight Bearing Restrictions: Yes LLE Weight Bearing: Partial weight bearing LLE Partial Weight Bearing Percentage or Pounds: 50% General   Vital Signs   Pain Pain Assessment Pain Score: 6  Pain Type: Surgical pain Pain Location: Leg Pain Orientation: Left Pain Descriptors / Indicators: Aching Pain Intervention(s): Medication (See eMAR) ADL   Vision/Perception  Vision - History Baseline Vision: Wears glasses all the time Patient Visual Report: No change from baseline Vision - Assessment Eye Alignment: Within Functional Limits Vision Assessment: Vision not tested Perception Perception: Within Functional Limits  Cognition Overall Cognitive Status: Within Functional Limits for tasks assessed Arousal/Alertness: Awake/alert Orientation Level: Oriented X4 Attention: Selective Selective Attention: Appears intact Memory: Appears intact Awareness: Appears intact Problem Solving: Appears intact Behaviors: Lability Safety/Judgment: Appears intact Sensation Sensation Light Touch: Appears Intact Coordination Gross Motor Movements are Fluid and Coordinated: Yes Fine Motor Movements are Fluid and Coordinated: Yes Motor    Mobility     Trunk/Postural Assessment     Balance   Extremity/Trunk Assessment RUE Assessment RUE Assessment: Within Functional Limits LUE Assessment LUE Assessment: Within Functional Limits  See FIM for current functional status  Majel Giel N 10/19/2013, 12:10 PM

## 2013-10-20 ENCOUNTER — Telehealth: Payer: Self-pay | Admitting: *Deleted

## 2013-10-20 MED ORDER — ASPIRIN 325 MG PO TBEC: 325.0000 mg | DELAYED_RELEASE_TABLET | Freq: Two times a day (BID) | ORAL | Status: DC

## 2013-10-20 MED ORDER — TRAMADOL HCL 50 MG PO TABS: 50.0000 mg | ORAL_TABLET | Freq: Four times a day (QID) | ORAL | Status: DC | PRN

## 2013-10-20 MED ORDER — OXYCODONE HCL 5 MG PO TABS: 5.0000 mg | ORAL_TABLET | ORAL | Status: DC | PRN

## 2013-10-20 MED ORDER — METFORMIN HCL 500 MG PO TABS: 500.0000 mg | ORAL_TABLET | Freq: Every day | ORAL | Status: DC

## 2013-10-20 MED ORDER — OXYCODONE HCL ER 20 MG PO T12A: EXTENDED_RELEASE_TABLET | ORAL | Status: DC

## 2013-10-20 MED ORDER — POLYETHYLENE GLYCOL 3350 17 G PO PACK: 17.0000 g | PACK | Freq: Every day | ORAL | Status: DC

## 2013-10-20 MED ORDER — METHOCARBAMOL 500 MG PO TABS: 1000.0000 mg | ORAL_TABLET | Freq: Four times a day (QID) | ORAL | Status: DC

## 2013-10-20 MED ORDER — DSS 100 MG PO CAPS: 100.0000 mg | ORAL_CAPSULE | Freq: Two times a day (BID) | ORAL | Status: DC

## 2013-10-20 MED ORDER — AMITRIPTYLINE HCL 10 MG PO TABS: 10.0000 mg | ORAL_TABLET | Freq: Every day | ORAL | Status: DC

## 2013-10-20 NOTE — Progress Notes (Signed)
Pt discharged to home at 1130 with family. Discharge instructions given by Marissa Nestle, PA with verbal understanding. Belongings with pt and family.

## 2013-10-20 NOTE — Plan of Care (Signed)
Problem: RH PAIN MANAGEMENT Goal: RH STG PAIN MANAGED AT OR BELOW PT'S PAIN GOAL Pain goal less than 3  Outcome: Completed/Met Date Met:  10/20/13 managed with pain medication

## 2013-10-20 NOTE — Progress Notes (Addendum)
Physical Therapy Discharge Summary  Patient Details  Name: QUANETTA TRUSS MRN: [id number] Date of Birth: 1980-12-23  Today's Date: 10/20/2013  Patient has met 8 of 9 long term goals due to improved activity tolerance, improved balance, improved postural control, increased strength, decreased pain, ability to compensate for deficits and functional use of  left lower extremity.  Patient to discharge at an ambulatory level for household mobility at Modified Independent and at a wheelchair level for community mobility at Supervision to  Modified Independent.   Patient's care partner is independent to provide the necessary physical assistance at discharge.  Reasons goals not met: goal addressing bed mobility at modified independent level is not met, as patient required min A for LLE management; however, patient's bed mobility is adequate for discharge.  Recommendation:  Patient will benefit from ongoing skilled PT services in outpatient setting to continue to advance safe functional mobility, address ongoing impairments in LE strength, stability/independence with functional mobility, and minimize fall risk.  Equipment: bariatric walker, wheelchair, wheelchair cushion  Reasons for discharge: treatment goals met and discharge from hospital  Patient/family agrees with progress made and goals achieved: Yes  PT Discharge Precautions/Restrictions Precautions Precautions: Fall Restrictions Weight Bearing Restrictions: Yes LLE Weight Bearing: Partial weight bearing LLE Partial Weight Bearing Percentage or Pounds: 50% Vision/Perception  Vision - History Baseline Vision: Wears glasses all the time Patient Visual Report: No change from baseline Vision - Assessment Eye Alignment: Within Functional Limits Vision Assessment: Vision not tested Perception Perception: Within Functional Limits  Cognition Overall Cognitive Status: Within Functional Limits for tasks assessed Arousal/Alertness:  Awake/alert Orientation Level: Oriented X4 Attention: Selective Selective Attention: Appears intact Memory: Appears intact Awareness: Appears intact Problem Solving: Appears intact Behaviors: Lability Safety/Judgment: Appears intact Sensation Sensation Light Touch: Appears Intact Proprioception: Appears Intact Coordination Gross Motor Movements are Fluid and Coordinated: Yes Fine Motor Movements are Fluid and Coordinated: Yes Motor  Motor Motor: Within Functional Limits  Mobility Bed Mobility Bed Mobility: Scooting to HOB;Sit to Supine;Supine to Sit Supine to Sit: 4: Min assist;HOB flat Supine to Sit: Patient Percentage: 80% Supine to Sit Details (indicate cue type and reason): Min A for LLE management Sit to Supine: 4: Min assist Sit to Supine: Patient Percentage: 80% Sit to Supine - Details: Tactile cues for placement;Verbal cues for technique Sit to Supine - Details (indicate cue type and reason): Min A for LLE management Scooting to [facility]: 6: Modified independent (Device/Increase time) Scooting to [facility] Details (indicate cue type and reason): Performs using bilat bed rails and RLE. Transfers Transfers: Yes Sit to Stand: With armrests;6: Modified independent (Device/Increase time);From chair/3-in-1 Sit to Stand Details: Verbal cues for technique;Verbal cues for precautions/safety Stand to Sit: 6: Modified independent (Device/Increase time);With armrests Stand Pivot Transfers: 6: Modified independent (Device/Increase time) Stand Pivot Transfer Details (indicate cue type and reason): Stand-pivot from bed<>w/c using rolling walker, mod I Locomotion  Ambulation Ambulation: Yes Ambulation/Gait Assistance: 6: Modified independent (Device/Increase time) Ambulation Distance (Feet): 35 Feet Assistive device: Rolling walker Gait Gait: Yes Gait Pattern: Impaired Gait Pattern: Step-through pattern;Decreased weight shift to left Stairs / Additional Locomotion Stairs:  Yes Wheelchair Mobility Wheelchair Mobility: Yes Wheelchair Assistance: 6: Modified independent (Device/Increase time) Occupational hygienist: Both upper extremities Wheelchair Parts Management: Supervision/cueing Distance: >150  Trunk/Postural Assessment  Cervical Assessment Cervical Assessment: Within Functional Limits Thoracic Assessment Thoracic Assessment: Within Functional Limits Lumbar Assessment Lumbar Assessment: Within Functional Limits Postural Control Postural Control: Within Functional Limits  Balance Balance Balance Assessed: Yes Static Sitting Balance Static  Sitting - Balance Support: No upper extremity supported Static Sitting - Level of Assistance: 7: Independent Static Sitting - Comment/# of Minutes: >5 minutes  Dynamic Sitting Balance Dynamic Sitting - Balance Support: No upper extremity supported;Feet supported Dynamic Sitting - Level of Assistance: 7: Independent Dynamic Sitting - Balance Activities: Lateral lean/weight shifting;Forward lean/weight shifting;Reaching for objects Dynamic Sitting - Comments: > 5 minutes Static Standing Balance Static Standing - Balance Support: Bilateral upper extremity supported Static Standing - Level of Assistance: 6: Modified independent (Device/Increase time) (bilat UE supported at rolling walker) Dynamic Standing Balance Dynamic Standing - Balance Support: During functional activity Dynamic Standing - Level of Assistance: 5: Stand by assistance Dynamic Standing - Comments: x5 minutes, 15 seconds with SBA; intermittent UE support at countertop Extremity Assessment  RUE Assessment RUE Assessment: Within Functional Limits LUE Assessment LUE Assessment: Within Functional Limits RLE Assessment RLE Assessment: Within Functional Limits LLE Strength LLE Overall Strength Comments: Observation of functional mobility suggests grossly 4-5 to 4/5 strength in L hip in all planes and 4/5 to 4+/5 strength L knee/ankle.  See FIM for  current functional status  Cari Burgo, Lorenda Ishihara 10/20/2013, 7:55 PM

## 2013-10-20 NOTE — Progress Notes (Signed)
Social Work Discharge Note Discharge Note  The overall goal for the admission was met for:   Discharge location: Yes-HOM WITH PARENTS WHO CAN PROVIDE 24 HR CARE  Length of Stay: Yes-9 days  Discharge activity level: Yes-min/mod level  Home/community participation: Yes  Services provided included: MD, RD, PT, OT, RN, TR, Pharmacy and SW  Financial Services: Medicaid  Follow-up services arranged: Outpatient: [place] [facility]PP[facility]-CALL PARENTS WITH APPT and DME: [facility]-WHEELCHAIR, DROP-ARM BSC, TUB BENCH, ROLLING WALKER  Comments (or additional information):MOM HAS COMPLETED FAMILY EDUCATION AND ALL FEEL COMFORTABLE WITH DISCHARGE TODAY. OPPT DUE TO MEDICAID WILL NOT PAY FOR HOME HEALTH.  Patient/Family verbalized understanding of follow-up arrangements: Yes  Individual responsible for coordination of the follow-up plan: PARENTS  Confirmed correct DME delivered: Lucy Chris 10/20/2013    Lucy Chris

## 2013-10-20 NOTE — Telephone Encounter (Signed)
Submitted prior auth for Oxycontin to [employer] today per request of Delle Reining PA

## 2013-10-20 NOTE — Progress Notes (Signed)
Subjective/Complaints: No issues overnite Bowel and bladder function is good  Review of Systems - Negative except Left thigh pain Objective: Vital Signs: Blood pressure 102/50, pulse 97, temperature 98.3 F (36.8 C), temperature source Oral, resp. rate 18, height 5\' 3"  (1.6 m), weight 142.7 kg (314 lb 9.5 oz), SpO2 95.00%. No results found. Results for orders placed during the hospital encounter of 10/09/13 (from the past 72 hour(s))  GLUCOSE, CAPILLARY     Status: Abnormal   Collection Time    10/17/13 12:01 PM      Result Value Range   Glucose-Capillary 136 (*) 70 - 99 mg/dL   Comment 1 Notify RN    GLUCOSE, CAPILLARY     Status: Abnormal   Collection Time    10/17/13  4:54 PM      Result Value Range   Glucose-Capillary 136 (*) 70 - 99 mg/dL   Comment 1 Notify RN    GLUCOSE, CAPILLARY     Status: Abnormal   Collection Time    10/17/13  9:04 PM      Result Value Range   Glucose-Capillary 146 (*) 70 - 99 mg/dL   Comment 1 Notify RN    GLUCOSE, CAPILLARY     Status: Abnormal   Collection Time    10/18/13  7:38 AM      Result Value Range   Glucose-Capillary 159 (*) 70 - 99 mg/dL   Comment 1 Notify RN    GLUCOSE, CAPILLARY     Status: Abnormal   Collection Time    10/18/13 11:49 AM      Result Value Range   Glucose-Capillary 155 (*) 70 - 99 mg/dL   Comment 1 Notify RN    GLUCOSE, CAPILLARY     Status: Abnormal   Collection Time    10/18/13  5:06 PM      Result Value Range   Glucose-Capillary 201 (*) 70 - 99 mg/dL   Comment 1 Notify RN    GLUCOSE, CAPILLARY     Status: Abnormal   Collection Time    10/18/13  9:31 PM      Result Value Range   Glucose-Capillary 202 (*) 70 - 99 mg/dL  GLUCOSE, CAPILLARY     Status: Abnormal   Collection Time    10/19/13  7:29 AM      Result Value Range   Glucose-Capillary 155 (*) 70 - 99 mg/dL   Comment 1 Notify RN    GLUCOSE, CAPILLARY     Status: Abnormal   Collection Time    10/19/13 11:51 AM      Result Value Range   Glucose-Capillary 116 (*) 70 - 99 mg/dL   Comment 1 Notify RN    GLUCOSE, CAPILLARY     Status: Abnormal   Collection Time    10/19/13  5:11 PM      Result Value Range   Glucose-Capillary 116 (*) 70 - 99 mg/dL  GLUCOSE, CAPILLARY     Status: Abnormal   Collection Time    10/19/13  9:10 PM      Result Value Range   Glucose-Capillary 175 (*) 70 - 99 mg/dL   Comment 1 Notify RN    GLUCOSE, CAPILLARY     Status: Abnormal   Collection Time    10/20/13  7:47 AM      Result Value Range   Glucose-Capillary 123 (*) 70 - 99 mg/dL     HEENT: normal Cardio: RRR and no murmur Resp: CTA B/L and  unlabored GI: BS positive and NT Extremity:  Pulses positive and No Edema Skin:   Intact and Wound C/D/I and groin, distal thigh Neuro: Alert/Oriented, Normal Sensory and Abnormal Motor 3-/5 L KE, 2- L HF, 4/5 L ankle DF/PF, 5/5 in BUE and RLE Musc/Skel:  Swelling Left thigh Gen NAD   Assessment/Plan: 1. Functional deficits secondary to Left femoral shaft fx which require 3+ hours per day of interdisciplinary therapy in a comprehensive inpatient rehab setting.  Stable for D/C today F/u PCP in 1-2 weeks F/u ortho 3 weeks See D/C summary  See D/C instructions  FIM: FIM - Bathing Bathing Steps Patient Completed: Chest;Right Arm;Left Arm;Abdomen;Right upper leg;Left upper leg;Left lower leg (including foot);Right lower leg (including foot) Bathing: 4: Min-Patient completes 8-9 61f 10 parts or 75+ percent  FIM - Upper Body Dressing/Undressing Upper body dressing/undressing steps patient completed: Thread/unthread right bra strap;Thread/unthread left bra strap;Hook/unhook bra;Thread/unthread right sleeve of pullover shirt/dresss;Thread/unthread left sleeve of pullover shirt/dress;Put head through opening of pull over shirt/dress;Pull shirt over trunk Upper body dressing/undressing: 7: Complete Independence: No helper FIM - Lower Body Dressing/Undressing Lower body dressing/undressing steps patient  completed: Thread/unthread right underwear leg;Thread/unthread left underwear leg;Thread/unthread right pants leg;Thread/unthread left pants leg;Don/Doff right sock;Don/Doff left sock;Pull pants up/down;Pull underwear up/down Lower body dressing/undressing: 5: Supervision: Safety issues/verbal cues  FIM - Toileting Toileting steps completed by patient: Adjust clothing prior to toileting;Adjust clothing after toileting Toileting: 3: Mod-Patient completed 2 of 3 steps  FIM - Archivist Transfers Assistive Devices: Art gallery manager Transfers: 5-From toilet/BSC: Supervision (verbal cues/safety issues);5-To toilet/BSC: Supervision (verbal cues/safety issues)  FIM - Banker Devices: Walker;Arm rests Bed/Chair Transfer: 6: Chair or W/C > Bed: No assist;6: Bed > Chair or W/C: No assist  FIM - Locomotion: Wheelchair Distance: 150 Locomotion: Wheelchair: 6: Travels 150 ft or more, turns around, maneuvers to table, bed or toilet, negotiates 3% grade: maneuvers on rugs and over door sills independently FIM - Locomotion: Ambulation Locomotion: Ambulation Assistive Devices: Designer, industrial/product Ambulation/Gait Assistance: 5: Supervision Locomotion: Ambulation: 1: Travels less than 50 ft with supervision/safety issues  Comprehension Comprehension Mode: Auditory Comprehension: 5-Understands basic 90% of the time/requires cueing < 10% of the time  Expression Expression Mode: Verbal Expression: 5-Expresses basic needs/ideas: With extra time/assistive device  Social Interaction Social Interaction: 5-Interacts appropriately 90% of the time - Needs monitoring or encouragement for participation or interaction.  Problem Solving Problem Solving: 4-Solves basic 75 - 89% of the time/requires cueing 10 - 24% of the time  Memory Memory: 4-Recognizes or recalls 75 - 89% of the time/requires cueing 10 - 24% of the time  Medical Problem List and Plan:  1. DVT  Prophylaxis/Anticoagulation: Pharmaceutical: Lovenox  2. Pain Management: prn medications ,hs Oxycontin ,on scheduled muscle relaxant 3. 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 Will have LCSW follow for evaluation.  4. Neuropsych: This patient is capable of making decisions on her own behalf.  5. DM type 2: Will monitor BS with ac/hs checks. Resumed Amaryl 4 mg bid, add metformin--anticipate BS to run high with stress. Will use SSI and meal coverage to keep BS controlled. Wean off levemir as Hgb A1c (7.6) --indicates reasonable control  6. Hypothyroid: Resume supplement.  7. Leucocytosis: Likely reactive.  8. Urinary retention:resolved 9. Hypoxia: improved 10 Tachycardia: likely due to deconditioning. Monitor heart rate with bid checks. Monitor for symptoms with increase in activity.    LOS (Days) 11 A FACE TO FACE EVALUATION WAS PERFORMED  Alexandra Kelley E 10/20/2013,  8:37 AM

## 2013-11-07 ENCOUNTER — Ambulatory Visit (HOSPITAL_COMMUNITY): Payer: Medicaid Other | Admitting: Physical Therapy

## 2013-11-10 ENCOUNTER — Encounter (HOSPITAL_COMMUNITY): Payer: Self-pay | Admitting: Emergency Medicine

## 2013-11-10 ENCOUNTER — Emergency Department (HOSPITAL_COMMUNITY)
Admission: EM | Admit: 2013-11-10 | Discharge: 2013-11-10 | Disposition: A | Discharge status: Home / Self Care | Payer: Medicaid Other | Attending: Emergency Medicine | Admitting: Emergency Medicine

## 2013-11-10 DIAGNOSIS — Z880 Allergy status to penicillin: Secondary | ICD-10-CM | POA: Insufficient documentation

## 2013-11-10 DIAGNOSIS — K219 Gastro-esophageal reflux disease without esophagitis: Secondary | ICD-10-CM | POA: Insufficient documentation

## 2013-11-10 DIAGNOSIS — M792 Neuralgia and neuritis, unspecified: Secondary | ICD-10-CM

## 2013-11-10 DIAGNOSIS — R000 Tachycardia, unspecified: Secondary | ICD-10-CM | POA: Insufficient documentation

## 2013-11-10 DIAGNOSIS — M7989 Other specified soft tissue disorders: Secondary | ICD-10-CM | POA: Insufficient documentation

## 2013-11-10 DIAGNOSIS — Z79899 Other long term (current) drug therapy: Secondary | ICD-10-CM | POA: Insufficient documentation

## 2013-11-10 DIAGNOSIS — E119 Type 2 diabetes mellitus without complications: Secondary | ICD-10-CM | POA: Insufficient documentation

## 2013-11-10 DIAGNOSIS — Z8781 Personal history of (healed) traumatic fracture: Secondary | ICD-10-CM | POA: Insufficient documentation

## 2013-11-10 DIAGNOSIS — Z9889 Other specified postprocedural states: Secondary | ICD-10-CM | POA: Insufficient documentation

## 2013-11-10 DIAGNOSIS — Z7982 Long term (current) use of aspirin: Secondary | ICD-10-CM | POA: Insufficient documentation

## 2013-11-10 DIAGNOSIS — E669 Obesity, unspecified: Secondary | ICD-10-CM | POA: Insufficient documentation

## 2013-11-10 DIAGNOSIS — M79609 Pain in unspecified limb: Secondary | ICD-10-CM

## 2013-11-10 DIAGNOSIS — E079 Disorder of thyroid, unspecified: Secondary | ICD-10-CM | POA: Insufficient documentation

## 2013-11-10 DIAGNOSIS — IMO0002 Reserved for concepts with insufficient information to code with codable children: Secondary | ICD-10-CM | POA: Insufficient documentation

## 2013-11-10 LAB — POCT I-STAT, CHEM 8
BUN: 14 mg/dL (ref 6–23)
CALCIUM ION: 1.2 mmol/L (ref 1.12–1.23)
Chloride: 100 mEq/L (ref 96–112)
Creatinine, Ser: 0.8 mg/dL (ref 0.50–1.10)
Glucose, Bld: 151 mg/dL — ABNORMAL HIGH (ref 70–99)
HEMATOCRIT: 45 % (ref 36.0–46.0)
HEMOGLOBIN: 15.3 g/dL — AB (ref 12.0–15.0)
POTASSIUM: 4.1 meq/L (ref 3.7–5.3)
Sodium: 139 mEq/L (ref 137–147)
TCO2: 29 mmol/L (ref 0–100)

## 2013-11-10 LAB — GLUCOSE, CAPILLARY: GLUCOSE-CAPILLARY: 139 mg/dL — AB (ref 70–99)

## 2013-11-10 MED ORDER — HYDROCODONE-ACETAMINOPHEN 5-325 MG PO TABS
1.0000 | ORAL_TABLET | Freq: Once | ORAL | Status: AC
  Administered 2013-11-10: 1 via ORAL
  Filled 2013-11-10: qty 1

## 2013-11-10 NOTE — ED Provider Notes (Signed)
CSN: [id number]     Arrival date & time 11/10/13  1133 History   First MD Initiated Contact with Patient 11/10/13 1502     Chief Complaint  Patient presents with  . leg cramps    (Consider location/radiation/quality/duration/timing/severity/associated sxs/prior Treatment) Patient is a 33 y.o. female presenting with leg pain.  Leg Pain Location:  Foot Injury: no   Foot location:  L foot and R foot Pain details:    Quality:  Burning   Radiates to:  Does not radiate   Severity:  Severe   Onset quality:  Gradual   Duration:  3 weeks   Timing:  Intermittent   Progression:  Worsening Chronicity:  New Dislocation: no   Relieved by:  Nothing Ineffective treatments: neurontin. Associated symptoms: no back pain, no decreased ROM and no fever   Risk factors: obesity   Risk factors comment:  Recent femur fracture   Past Medical History  Diagnosis Date  . Diabetes mellitus without complication     Dr. Lurene ShadowBallen  . Thyroid disease   . GERD (gastroesophageal reflux disease)    Past Surgical History  Procedure Laterality Date  . Tubes in ears    . Back surgery    . Femur im nail Left 10/03/2013    Procedure: INTRAMEDULLARY (IM) RETROGRADE FEMORAL NAILING;  Surgeon: Eldred MangesMark C Yates, MD;  Location: [facility] OR;  Service: Orthopedics;  Laterality: Left;   Family History  Problem Relation Age of Onset  . Hypertension Mother   . Thyroid disease Mother    History  Substance Use Topics  . Smoking status: Never Smoker   . Smokeless tobacco: Never Used  . Alcohol Use: No   OB History   Grav Para Term Preterm Abortions TAB SAB Ect Mult Living                 Review of Systems  Constitutional: Negative for fever and chills.  HENT: Negative for sore throat.   Eyes: Negative for pain.  Respiratory: Negative for cough and shortness of breath.   Cardiovascular: Negative for chest pain.  Gastrointestinal: Negative for nausea, vomiting, abdominal pain and diarrhea.  Genitourinary: Negative for  dysuria.  Musculoskeletal: Negative for back pain.  Skin: Negative for rash.  Neurological: Negative for numbness and headaches.    Allergies  Ceclor; Invokana; Metformin and related; Onglyza; and Penicillins  Home Medications   Current Outpatient Rx  Name  Route  Sig  Dispense  Refill  . amitriptyline (ELAVIL) 10 MG tablet   Oral   Take 1 tablet (10 mg total) by mouth at bedtime. For pain   30 tablet   1   . aspirin 325 MG EC tablet   Oral   Take 1 tablet (325 mg total) by mouth 2 (two) times daily. Take with food.   Blood thinner for at least a month or till cleared to put full weight on left leg.      0   . bisacodyl (DULCOLAX) 10 MG suppository   Rectal   Place 1 suppository (10 mg total) rectally daily as needed for moderate constipation.   12 suppository   0   . diphenhydrAMINE-zinc acetate (BENADRYL) cream   Topical   Apply topically as needed for itching.   28.4 g   0   . fluticasone (FLONASE) 50 MCG/ACT nasal spray   Nasal   Place 2 sprays into the nose daily.         Marland Kitchen. glimepiride (AMARYL) 4 MG tablet  Oral   Take 4 mg by mouth 2 (two) times daily.         Marland Kitchen levothyroxine (SYNTHROID, LEVOTHROID) 137 MCG tablet   Oral   Take 137 mcg by mouth daily.         . metFORMIN (GLUCOPHAGE) 500 MG tablet   Oral   Take 1 tablet (500 mg total) by mouth daily with breakfast. New diabetes medication.   30 tablet   1   . methocarbamol (ROBAXIN) 500 MG tablet   Oral   Take 2 tablets (1,000 mg total) by mouth 4 (four) times daily.   200 tablet   0     Take two pills four times a day for muscle spasms. ...   . oxyCODONE (OXY IR/ROXICODONE) 5 MG immediate release tablet   Oral   Take 1 tablet (5 mg total) by mouth every 4 (four) hours as needed for severe pain or breakthrough pain.   100 tablet   0   . OxyCODONE (OXYCONTIN) 20 mg T12A 12 hr tablet      Long acting pain medication. Taper as below: Take one pill at  7 am and 7 pm for 10 days. Then  decrease to one pill per day till gone.   30 tablet   0   . pantoprazole (PROTONIX) 40 MG tablet   Oral   Take 1 tablet (40 mg total) by mouth daily at 12 noon.         . traMADol (ULTRAM) 50 MG tablet   Oral   Take 1 tablet (50 mg total) by mouth every 6 (six) hours as needed for moderate pain. Take one a bedtime daily for a 7-10 days to help you sleep.   90 tablet   0    BP 117/61  Pulse 116  Temp(Src) 98.1 F (36.7 C) (Oral)  Resp 22  Wt 295 lb (133.811 kg)  SpO2 94% Physical Exam  Constitutional: She is oriented to person, place, and time. She appears well-developed and well-nourished. No distress.  HENT:  Head: Normocephalic and atraumatic.  Eyes: Pupils are equal, round, and reactive to light. Right eye exhibits no discharge. Left eye exhibits no discharge.  Neck: Normal range of motion.  Cardiovascular: Regular rhythm and normal heart sounds.  Tachycardia present.   Pulmonary/Chest: Effort normal and breath sounds normal.  Abdominal: Soft. She exhibits no distension. There is no tenderness.  Musculoskeletal:       Right foot: She exhibits swelling. She exhibits normal range of motion, no tenderness, no crepitus and no deformity.       Left foot: She exhibits swelling. She exhibits normal range of motion, no tenderness, no crepitus and no deformity.  Neurological: She is alert and oriented to person, place, and time.  Skin: Skin is warm. She is not diaphoretic.    ED Course  Procedures (including critical care time) Labs Review Labs Reviewed - No data to display Imaging Review No results found.  EKG Interpretation   None       MDM   1. Neuropathic pain    33 yo F with hx of recent surgery who presents with leg cramps x 2 weeks without cough, nausea, shortness of breath, chest pain. Patient describes a severe painful and burning sensation on both feet, worse on the bottom. She denies any worsening significant swelling, any redness, any shortness of breath  chest pain. She denies any recent falls. She denies any bowel or bladder incontinence.  The patient is in  contact with her endocrinologist as well as with her orthopedist, who recently performed her surgery. Recently she was started on a capsaicin cream as well as Neurontin. She fell while in the hospital on to her back side, but it was a short fall, and no XRs were done at that time. No pain in the buttocks or back at this time. Patient had some mild shaking after taking the neurontin, so she has no longer taken this medication.   Doubt DVT, though recent surgery, tachycardia - patient appears from last hospitalization to have tachycardia at baseline (100s-110s). Denies shortness of breath, chest pain. No unilateral leg swelling/redness.   Ultrasound of bilateral lower extremities demonstrate no evidence of DVTs. The patient does have pain in the bottom of her feet. This appears to be an ongoing issue which is worsened over the last 2 weeks. Possibly related to the patient's diabetic status. All of this time the patient is hemodynamically stable and has no further issues needed to be worked up in the emergency room. Patient should followup with her primary care provider urgently. The patient has attempted to take Neurontin only one time. The attending and myself discussed with the family that is non-that time for the medication to take effect. We advised restarting this medication as instructed by her primary care physician in following up further titration. The mother the patient voices understanding, but other the patient appears visibly upset. He requested a neurologist come and evaluate the patient personally. I described to the father this is unnecessary at this time and then a neurologist would not be in to see the patient. Father also seemed visibly upset by this.  Patient discharged to home in stable condition. No concerning lab abnormalities. No DVT. Likely neuropathic pain. Recommended continued  neurontin use, breakthrough pain medications as prescribed, with follow-up with PCP on Monday, or earlier if possible. Discharged in stable condition. Mother of patient voices understanding of plan, as does the patient. Strict return precautions given.     Imagene Sheller, MD 11/10/13 [phone]

## 2013-11-10 NOTE — ED Notes (Signed)
Transporting patient to lobby after discharge.

## 2013-11-10 NOTE — ED Notes (Signed)
Father of patient wanted it to be known that the gabapentin dose is 300

## 2013-11-10 NOTE — ED Notes (Addendum)
Per family, patient fractured left femur on Dec 1st and had surgery here on Dec 2nd.  Was discharged from this facility on December 19th after rehab.  For the past 2 weeks the patient has been experiencing burning and cramping in both of her feet.    The father states that they called Dr. Ophelia CharterYates surgery service and was prescribed gabapentin on Dec 30th which she took that night.  Father states that during the night the patient was sleeping and he noticed her jerking. He mimicked her jerking movement in a manner that seemed like seizure-like activity.  They have not taken the gabapentin since.

## 2013-11-10 NOTE — ED Notes (Signed)
Patient transported to Vascular lab. 

## 2013-11-10 NOTE — ED Notes (Signed)
Patient has bouts of crying that come and go, lasting less than 10 seconds whenever someone is nearby.  States that her feet hurt.  Dr Gordy LevanWalton notified, see new orders.

## 2013-11-10 NOTE — Progress Notes (Signed)
*  Preliminary Results* Bilateral lower extremity venous duplex completed. Bilateral lower extremities are negative for deep vein thrombosis. There is no evidence of Baker's cyst bilaterally.  11/10/2013  Larrisha Babineau, RVT, RDCS, RDMS  

## 2013-11-10 NOTE — ED Notes (Signed)
Painful feet and she has also had some leg cramps for 1-2 weeks.  Numerus  Treatments have been tried.  Lt femur frac dxd dec 19.  Her pain has increased since then

## 2013-11-10 NOTE — ED Notes (Signed)
Patient transported to Ultrasound 

## 2013-11-10 NOTE — ED Provider Notes (Signed)
I saw and evaluated the patient, reviewed the resident's note and I agree with the findings and plan.  EKG Interpretation   None       Patient presents with bilateral leg cramping and burning. This is worsened since recent left femur fracture on December 19.  Patient reports bilateral foot burning and tingling as well as leg cramping.  She was placed on Neurontin by her primary physician; however she only took one dose.  Patient also reports bilateral lower extremity swelling for last 2-3 days. She is nontoxic on exam. Vital signs notable for tachycardia to 117.  Patient has tenderness to palpation over the bilateral calves. Patient is at risk for DVT given recent surgery; however, the the bilateral nature of the patient's pain is more suspicious for diabetic neuropathy.  Ultrasound negative. Encouraged patient to restart Neurontin and followup with primary physician.  After history, exam, and medical workup I feel the patient has been appropriately medically screened and is safe for discharge home. Pertinent diagnoses were discussed with the patient. Patient was given return precautions.   Shon Batonourtney F Matej Sappenfield, MD 11/10/13 2251

## 2013-11-10 NOTE — ED Notes (Signed)
Dr. Walton at bedside 

## 2013-11-10 NOTE — ED Notes (Signed)
Patient has also been experiencing pain her groin area, according to the father there was a screw placed there.  States the area is burning, but not when she urinates.

## 2013-11-10 NOTE — ED Notes (Signed)
Patient ambulating to bathroom

## 2013-11-10 NOTE — ED Provider Notes (Signed)
I saw and evaluated the patient, reviewed the resident's note and I agree with the findings and plan.  EKG Interpretation   None       See separate note.  Shon Batonourtney F Horton, MD 11/10/13 2252

## 2013-11-20 ENCOUNTER — Ambulatory Visit (HOSPITAL_COMMUNITY)
Admission: RE | Admit: 2013-11-20 | Discharge: 2013-11-20 | Disposition: A | Discharge status: Home / Self Care | Payer: Medicaid Other | Source: Ambulatory Visit | Attending: Physical Medicine & Rehabilitation | Admitting: Physical Medicine & Rehabilitation

## 2013-11-20 DIAGNOSIS — E119 Type 2 diabetes mellitus without complications: Secondary | ICD-10-CM | POA: Insufficient documentation

## 2013-11-20 DIAGNOSIS — IMO0001 Reserved for inherently not codable concepts without codable children: Secondary | ICD-10-CM | POA: Insufficient documentation

## 2013-11-20 DIAGNOSIS — M25562 Pain in left knee: Secondary | ICD-10-CM | POA: Insufficient documentation

## 2013-11-20 DIAGNOSIS — S7292XA Unspecified fracture of left femur, initial encounter for closed fracture: Secondary | ICD-10-CM

## 2013-11-20 DIAGNOSIS — M79609 Pain in unspecified limb: Secondary | ICD-10-CM | POA: Insufficient documentation

## 2013-11-20 DIAGNOSIS — M6281 Muscle weakness (generalized): Secondary | ICD-10-CM | POA: Insufficient documentation

## 2013-11-20 DIAGNOSIS — R262 Difficulty in walking, not elsewhere classified: Secondary | ICD-10-CM | POA: Insufficient documentation

## 2013-11-20 NOTE — Evaluation (Signed)
Physical Therapy Evaluation  Patient Details  Name: Alexandra Kelley MRN: [id number] Date of Birth: May 27, 1981  Today's Date: 11/20/2013 Time: 1300-1400 PT Time Calculation (min): 60 min Charges : physical therapy evaluation              Visit#: 1 of 12  Re-eval: 12/20/13 Assessment Diagnosis: left femur fx  Surgical Date: 10/03/13 Next MD Visit: dr. Ophelia Charter  Prior Therapy: no   Authorization: medicaid     Authorization Time Period: submitting for 16 visits   Authorization Visit#: 1 of     Past Medical History:  Past Medical History  Diagnosis Date  . Diabetes mellitus without complication     Dr. Lurene Shadow  . Thyroid disease   . GERD (gastroesophageal reflux disease)    Past Surgical History:  Past Surgical History  Procedure Laterality Date  . Tubes in ears    . Back surgery    . Femur im nail Left 10/03/2013    Procedure: INTRAMEDULLARY (IM) RETROGRADE FEMORAL NAILING;  Surgeon: Eldred Manges, MD;  Location: [facility] OR;  Service: Orthopedics;  Laterality: Left;    Subjective Symptoms/Limitations Symptoms: patient presents with parents for initial evaluation in wheelchair, she has been using wheelchair at home mostly and some use of wheeled walker , parents express high pain levels at night , parents express 75% weight bearing orders from dr. Left femur fx due to a fall when getting up from chair  Pertinent History: patient had fall on 10/02/2013 left femur fx then surgery 10/03/2013, hx of back surgery, wrist surgery, obesity, DM , hx  of neuropathy  Limitations: Standing;Walking, stairs, bed mobility  How long can you sit comfortably?: no limits with sitting, pain in sitting left groin area  Patient Stated Goals: return to walking at store with mom, return to standing and washing dishes   Pain Assessment Currently in Pain?: Yes Pain Score: 5 , 8+/10 at night  Pain Location: Leg (groin ) Pain Orientation: Left Pain Type: Surgical pain Pain Onset: More than a month ago Pain  Frequency: Constant Pain Relieving Factors: medications   Effect of Pain on Daily Activities: pain with sitting and walking using wheeled walker , high pain levels at night disurbing sleep    Precautions/Restrictions  Restrictions Weight Bearing Restrictions: Yes Other Position/Activity Restrictions: 75% WB per father   Balance Screening Balance Screen Has the patient fallen in the past 6 months: Yes How many times?: 1 Has the patient had a decrease in activity level because of a fear of falling? : Yes Is the patient reluctant to leave their home because of a fear of falling? : No  Prior Functioning  Home Living Living Arrangements:  (patient lives with parents) Home Layout: Two level Additional Comments: ramp to enter home, 4-5 stairs prior to ramp installation without railing   Lives With:  (parents ) Prior Function Level of Independence: Independent with gait;Independent with transfers  Able to Take Stairs?: Yes Driving: No Leisure:  (shopping at store ) Comments: return to standing and washing dishes  Cognition/Observation Cognition Overall Cognitive Status: Within Functional Limits for tasks assessed  Assessment RLE AROM (degrees) Right Knee Extension: 0 Right Knee Flexion: 130 Right Ankle Dorsiflexion: 0 Right Ankle Plantar Flexion: 20 RLE Strength Right Knee Flexion: 4/5 Right Knee Extension: 3+/5 LLE AROM (degrees) Left Knee Extension: 0 Left Knee Flexion: 110 Left Ankle Dorsiflexion: 2 Left Ankle Plantar Flexion: 10 LLE Strength Left Hip Flexion: 2/5 Left Knee Flexion: 3/5 Left Knee Extension: 2/5 Left Ankle  Dorsiflexion: 3/5  Exercise/Treatments Mobility/Balance  Bed Mobility Bed Mobility: Sitting - Scoot to Edge of Bed Supine to Sit: 7: Independent Supine to Sit: Patient Percentage: 90% Transfers Transfers: Sit to Stand Transfers: Yes Sit to Stand: 5: Supervision (B hand use   > 5 seconds ) Stand to Sit: 5:  Supervision Ambulation/Gait Ambulation/Gait: Yes Ambulation/Gait Assistance: 5: Supervision Ambulation Distance (Feet): 60 Feet Assistive device: 4-wheeled walker Gait Pattern: Decreased stride length;Decreased stance time - left Wheelchair Mobility Wheelchair Mobility: Yes Wheelchair Assistance: 6: Modified independent (Device/Increase time)     Seated Long Arc Quad: Strengthening;10 reps;Both Other Seated Knee Exercises: seated hip flexion 10 reps B  Supine       Ankle Exercises - Seated Ankle Circles/Pumps: AROM;10 reps (long sit ) Toe Raise: 10 reps       Physical Therapy Assessment and Plan PT Assessment and Plan Clinical Impression Statement: 33 yr old female s/p left femur fx ORIF 10/03/2013 due to fall. she is progressing to ambulation 75% weight bearing with wheeled walker. She has deficits with bed mobility leg management, requires min assist for bathing , and is limited to short ambulation distances in home at this time. She requires skilled outpateint therapy to return to ambulation no device inside her home and community.  Pt will benefit from skilled therapeutic intervention in order to improve on the following deficits: Abnormal gait;Decreased activity tolerance;Difficulty walking;Pain;Obesity;Decreased mobility;Decreased strength Rehab Potential: Good Clinical Impairments Affecting Rehab Potential: diabetic neuropathy, obesity , pateint indpendent with gait and stairs prior to injury , patient able to ambulate safely at store prior to injury  PT Frequency: Min 2X/week PT Duration: 8 weeks PT Treatment/Interventions: Stair training;Functional mobility training;Therapeutic activities;Therapeutic exercise;Neuromuscular re-education;Balance training;Patient/family education;Manual techniques;Gait training PT Plan: seated and standing therapeutic exercise for B LE, B long istting calf stretches, ambulation with walker, quad strengthening     Goals Home Exercise  Program Pt/caregiver will Perform Home Exercise Program: Independently PT Goal: Perform Home Exercise Program - Progress: Goal set today PT Short Term Goals Time to Complete Short Term Goals: 3 weeks PT Short Term Goal 1: ambulate with wheeled  walker safely 10 min for household ambulation and start of community ambulation  PT Short Term Goal 2: patient able to transfer in and out of bed without left leg assistance from family  PT Long Term Goals Time to Complete Long Term Goals: 8 weeks PT Long Term Goal 1: Patient walking 15 min  no device indepenent level surfaces  for community ambulation  PT Long Term Goal 2: Ascend and descend 4-5 stairs with railing for community acitivites and house entry/exit  Long Term Goal 3: patient able to safely stand at kitchen sink 5 + minutes  and wash dishes and  or meal prep   Problem List Patient Active Problem List   Diagnosis Date Noted  . Muscle weakness (generalized) 11/20/2013  . Pain, joint, lower leg, left 11/20/2013  . Difficulty in walking(719.7) 11/20/2013  . Femur fracture, left 10/09/2013  . Acute urinary retention 10/04/2013  . Acute-on-chronic respiratory failure 10/04/2013  . Femur fracture 10/03/2013  . Closed displaced oblique fracture of shaft of left femur 10/03/2013  . Hypothyroidism 10/03/2013  . Diabetes 10/03/2013    General Behavior During Therapy: [facility] for tasks assessed/performed PT Plan of Care PT Home Exercise Plan: instructed in ankle pumps, LAQ, seated march via family education and written handout - needs further teaching  Consulted and Agree with Plan of Care: Patient;Family member/caregiver Family Member Consulted: mom and  dad   - present during  evaluation- also provide transportation   GP    Braylin Xu   PT  11/20/2013, 2:48 PM  Physician Documentation Your signature is required to indicate approval of the treatment plan as stated above.  Please sign and either send electronically or make a copy of  this report for your files and return this physician signed original.   Please mark one 1.__approve of plan  2. ___approve of plan with the following conditions.   ______________________________                                                          _____________________ Physician Signature                                                                                                             Date

## 2013-11-27 ENCOUNTER — Ambulatory Visit (HOSPITAL_COMMUNITY)
Admission: RE | Admit: 2013-11-27 | Discharge: 2013-11-27 | Disposition: A | Discharge status: Home / Self Care | Payer: Medicaid Other | Source: Ambulatory Visit | Attending: Physical Medicine & Rehabilitation | Admitting: Physical Medicine & Rehabilitation

## 2013-11-27 DIAGNOSIS — M25562 Pain in left knee: Secondary | ICD-10-CM

## 2013-11-27 DIAGNOSIS — S7292XA Unspecified fracture of left femur, initial encounter for closed fracture: Secondary | ICD-10-CM

## 2013-11-27 DIAGNOSIS — S72332A Displaced oblique fracture of shaft of left femur, initial encounter for closed fracture: Secondary | ICD-10-CM

## 2013-11-27 DIAGNOSIS — M6281 Muscle weakness (generalized): Secondary | ICD-10-CM

## 2013-11-27 DIAGNOSIS — R262 Difficulty in walking, not elsewhere classified: Secondary | ICD-10-CM

## 2013-11-27 NOTE — Progress Notes (Signed)
Physical Therapy Treatment Patient Details  Name: Alexandra Kelley MRN: [id number] Date of Birth: 10-30-1981  Today's Date: 11/27/2013 Time: 1300-1345 PT Time Calculation (min): 45 min TE 1300- 1345  Visit#: 2 of 16  Re-eval: 12/20/13 Assessment Diagnosis: left femur fx  Surgical Date: 10/03/13 Next MD Visit: dr. Ophelia Charter   Authorization: medicaid   Authorization Time Period: submitting for 16 visits   Authorization Visit#: 2 of     Subjective: Symptoms/Limitations Symptoms: presents with dad, he reports daughter having high pain levels at night, groin pain at night, lateral l pain left thigh at time of session , they will see Dr tomorrow  Pain Assessment Currently in Pain?: Yes Pain Score: 5  Pain Location: Leg Pain Orientation: Left  Precautions/Restrictions     Exercise/Treatments Mobility/Balance        Stretches   Aerobic Stationary Bike: Nu step 6 min  Machines for Strengthening     Standing - progress next visit    Seated Long Arc Quad: Both;2 sets;10 reps Other Seated Knee Exercises: seated hip flexion 10 reps B  Other Seated Knee Exercises: seated heel toe raises  2 x 10  Supine Short Arc Quad Sets: Both;10 reps;2 sets;Strengthening Straight Leg Raises: Strengthening;10 reps;Both Supine hip flexion  2 x 10 B     Ankle Stretches- next visit    Aerobic Exercises      Physical Therapy Assessment and Plan PT Assessment and Plan- appears partial weight bearing during ambulation with walker across gym, limited face grimacing, mild tenderness lateral hip musculature , PROM left hip and knee flexion WNL, able to transfer sit to supine independent this visit, prior visit needed leg assistance  PT Plan: progress as tolerated, standing ther ex progression  as tolerated ,     Goals Home Exercise Program Pt/caregiver will Perform Home Exercise Program: With Supervision, verbal cues required/provided PT Goal: Perform Home Exercise Program - Progress:  Progressing toward goal PT Short Term Goals Time to Complete Short Term Goals: 3 weeks PT Short Term Goal 1: ambulate with wheeled  walker safely 10 min for household ambulation and start of community ambulation  PT Short Term Goal 1 - Progress: Progressing toward goal PT Short Term Goal 2: patient able to transfer in and out of bed without left leg assistance from family  PT Short Term Goal 2 - Progress: Progressing toward goal PT Long Term Goals Time to Complete Long Term Goals: 8 weeks PT Long Term Goal 1: Patient walking 15 min  no device indepenent level surfaces  for community ambulation  PT Long Term Goal 1 - Progress: Progressing toward goal PT Long Term Goal 2: Ascend and descend 4-5 stairs with railing for community acitivites and house entry/exit  PT Long Term Goal 2 - Progress: Progressing toward goal Long Term Goal 3: patient able to safely stand at kitchen sink 5 + miutes  and wash dishes or meal prep  Long Term Goal 3 Progress: Progressing toward goal  Problem List Patient Active Problem List   Diagnosis Date Noted  . Muscle weakness (generalized) 11/20/2013  . Pain, joint, lower leg, left 11/20/2013  . Difficulty in walking(719.7) 11/20/2013  . Femur fracture, left 10/09/2013  . Acute urinary retention 10/04/2013  . Acute-on-chronic respiratory failure 10/04/2013  . Femur fracture 10/03/2013  . Closed displaced oblique fracture of shaft of left femur 10/03/2013  . Hypothyroidism 10/03/2013  . Diabetes 10/03/2013    PT Plan of Care Consulted and Agree with Plan of Care: Family member/caregiver,  strongly  encouraged father to discuss pain meds options at visit tomorrow  Family Member Consulted: father   GP    Judy PimpleKaydo,Ethel Veronica 11/27/2013, 1:53 PM

## 2013-12-04 ENCOUNTER — Ambulatory Visit (HOSPITAL_COMMUNITY): Payer: Medicaid Other | Admitting: Physical Therapy

## 2014-02-01 ENCOUNTER — Other Ambulatory Visit (HOSPITAL_COMMUNITY): Payer: Self-pay | Admitting: Podiatry

## 2014-02-01 DIAGNOSIS — R52 Pain, unspecified: Secondary | ICD-10-CM

## 2014-02-01 DIAGNOSIS — I998 Other disorder of circulatory system: Secondary | ICD-10-CM

## 2014-02-02 ENCOUNTER — Other Ambulatory Visit (HOSPITAL_COMMUNITY): Payer: Self-pay | Admitting: Podiatry

## 2014-02-05 ENCOUNTER — Ambulatory Visit (HOSPITAL_COMMUNITY)
Admission: RE | Admit: 2014-02-05 | Discharge: 2014-02-05 | Disposition: A | Discharge status: Home / Self Care | Payer: Medicaid Other | Source: Ambulatory Visit | Attending: Podiatry | Admitting: Podiatry

## 2014-02-05 ENCOUNTER — Ambulatory Visit (HOSPITAL_COMMUNITY): Payer: Medicaid Other

## 2014-02-05 DIAGNOSIS — M79609 Pain in unspecified limb: Secondary | ICD-10-CM | POA: Insufficient documentation

## 2014-02-05 DIAGNOSIS — R52 Pain, unspecified: Secondary | ICD-10-CM

## 2014-02-05 DIAGNOSIS — I998 Other disorder of circulatory system: Secondary | ICD-10-CM

## 2014-02-05 DIAGNOSIS — E119 Type 2 diabetes mellitus without complications: Secondary | ICD-10-CM | POA: Insufficient documentation

## 2014-02-05 DIAGNOSIS — I70219 Atherosclerosis of native arteries of extremities with intermittent claudication, unspecified extremity: Secondary | ICD-10-CM | POA: Insufficient documentation

## 2014-03-07 MED ORDER — SODIUM CHLORIDE 0.9 % IJ SOLN
INTRAMUSCULAR | Status: AC
  Filled 2014-03-07: qty 120

## 2014-03-07 MED ORDER — SODIUM CHLORIDE 0.9 % IJ SOLN
INTRAMUSCULAR | Status: AC
  Filled 2014-03-07: qty 50

## 2014-03-29 ENCOUNTER — Encounter: Payer: Self-pay | Admitting: Neurology

## 2014-03-29 ENCOUNTER — Ambulatory Visit (INDEPENDENT_AMBULATORY_CARE_PROVIDER_SITE_OTHER): Payer: Medicaid Other | Admitting: Neurology

## 2014-03-29 ENCOUNTER — Encounter (INDEPENDENT_AMBULATORY_CARE_PROVIDER_SITE_OTHER): Payer: Self-pay

## 2014-03-29 VITALS — BP 103/59 | HR 90 | Ht 61.0 in | Wt 295.0 lb

## 2014-03-29 DIAGNOSIS — R269 Unspecified abnormalities of gait and mobility: Secondary | ICD-10-CM | POA: Insufficient documentation

## 2014-03-29 NOTE — Progress Notes (Signed)
PATIENT: Alexandra Kelley DOB: February 06, 1981  HISTORICAL  [name] A Ebarb is a 33 years old right-handed Caucasian female, accompanied by her parents, referred by podiatrist Dr. Ferman HammingBenjamin McKinney, and primary care physician Dr. Phillips OdorGolding for evaluation of bilateral lower extremity pain  She had past medical history of diabetes, obesity, mental retardation, she was a full-term, but with mental delay, was able to special education, had a high school graduate, at baseline, she ambulate without assistance, able to help with simple house chores  She fell, broke her left femur in December 2014, x-ray showed comminuted predominantly oblique fracture through the mid and distal  left femoral shaft. There is medial displacement and angulation of the distal component  She underwent: Retrograde femoral nail with interlocks. By Dr. Ronnald NianMarquis in December second 2014 under general anesthesia, plus local  She was discharged to home in October 20 2013, able to ambulate with walker, but denies significant left femur, or left hip pain,  Around January 2015, she began to complains of bilateral feet, lower extremity paresthesia, burning pain, from knee down, worsening gait difficulty, could not pick up her right foot off the floor,   Bilateral lower extremity arterial Doppler study in a poor 6 2016 showed no evidence of hemodynamic vessel occlusive disease at bilateral lower extremity, elevated ABIs bilaterally likely: secondary to distal small vessel calcification, most commonly attributed to her previous history of diabetes  Bilateral lower extremity venous Doppler ultrasound showed no evidence of deep venous thrombosis in either lower extremity  Most recent laboratory evaluation in January 2015, showed normal CBC, BMP, with exception of elevated glucose 139  She is now taking tramadol 50 mg every 6 hours, Lyrica 50 mg 3 times a day, helps her some  She also complains of bilateral fingertips paresthesia,  but denies significant upper extremity weakness   REVIEW OF SYSTEMS: Full 14 system review of systems performed and notable only for bilateral upper and lower extremity paresthesia, lack weakness, gait difficulty  ALLERGIES: Allergies  Allergen Reactions  . Ceclor [Cefaclor]     Rash  . Invokana [Canagliflozin]     Severe yeast infections  . Metformin And Related     GI distress  . Onglyza [Saxagliptin]     headaches  . Penicillins Rash    HOME MEDICATIONS: Current Outpatient Prescriptions on File Prior to Visit  Medication Sig Dispense Refill  . diphenhydrAMINE-zinc acetate (BENADRYL) cream Apply topically as needed for itching.  28.4 g  0  . fluticasone (FLONASE) 50 MCG/ACT nasal spray Place 2 sprays into the nose daily.      Marland Kitchen. glimepiride (AMARYL) 4 MG tablet Take 4 mg by mouth 2 (two) times daily.      Marland Kitchen. levothyroxine (SYNTHROID, LEVOTHROID) 137 MCG tablet Take 137 mcg by mouth daily.      . methocarbamol (ROBAXIN) 500 MG tablet Take 2 tablets (1,000 mg total) by mouth 4 (four) times daily.  200 tablet  0  . omeprazole (PRILOSEC) 20 MG capsule Take 20 mg by mouth daily.      Marland Kitchen. oxyCODONE (OXY IR/ROXICODONE) 5 MG immediate release tablet Take 1 tablet (5 mg total) by mouth every 4 (four) hours as needed for severe pain or breakthrough pain.  100 tablet  0   No current facility-administered medications on file prior to visit.    PAST MEDICAL HISTORY: Past Medical History  Diagnosis Date  . Diabetes mellitus without complication     Dr. Lurene ShadowBallen  . Thyroid disease   .  GERD (gastroesophageal reflux disease)     PAST SURGICAL HISTORY: Past Surgical History  Procedure Laterality Date  . Tubes in ears    . Back surgery    . Femur im nail Left 10/03/2013    Procedure: INTRAMEDULLARY (IM) RETROGRADE FEMORAL NAILING;  Surgeon: Eldred Manges, MD;  Location: [facility] OR;  Service: Orthopedics;  Laterality: Left;    FAMILY HISTORY: Family History  Problem Relation Age of Onset  .  Hypertension Mother   . Thyroid disease Mother     SOCIAL HISTORY:  History   Social History  . Marital Status: Single    Spouse Name: N/A    Number of Children: N/A  . Years of Education: 12   Occupational History  . Not on file.   Social History Main Topics  . Smoking status: Never Smoker   . Smokeless tobacco: Never Used  . Alcohol Use: No  . Drug Use: No  . Sexual Activity: Not on file   Other Topics Concern  . Not on file   Social History Narrative   Patient lives at home with her parents [place] and [place].   Patient has a high school certificate.   Right handed.   Caffeine coke cola sometimes.     PHYSICAL EXAM   Filed Vitals:   03/29/14 1409  BP: 103/59  Pulse: 90  Height: 5\' 1"  (1.549 m)  Weight: 295 lb (133.811 kg)    Not recorded    Body mass index is 55.77 kg/(m^2).   Generalized: In no acute distress  Neck: Supple, no carotid bruits   Cardiac: Regular rate rhythm  Pulmonary: Clear to auscultation bilaterally  Musculoskeletal: No deformity  Neurological examination  Mentation: obese, tearful during interview.  Cranial nerve II-XII: Pupils were equal round reactive to light. Extraocular movements were full.  Visual field were full on confrontational test. Bilateral fundi were sharp.  Facial sensation and strength were normal. Hearing was intact to finger rubbing bilaterally. Uvula tongue midline.  Head turning and shoulder shrug and were normal and symmetric.Tongue protrusion into cheek strength was normal.  Motor: mild ankle dorsiflexion 4/5-, toe extension weakness 4/5-, toe flexion 4/5-  Sensory: Length dependent fine touch, pinprick to mid shin level, preserved toe vibratory sensation, and proprioception at toes.  Coordination: Normal finger to nose, heel-to-shin bilaterally there was no truncal ataxia  Gait: Relies on her walker, has right foot drop, could not stand up on tiptoe, or heels. She has big body habitus, unsteady  gait  Romberg signs: Negative  Deep tendon reflexes: Brachioradialis 2/2, biceps 2/2, triceps 2/2, patellar 1/2, Achilles absent, plantar responses were flexor bilaterally.   DIAGNOSTIC DATA (LABS, IMAGING, TESTING) - I reviewed patient records, labs, notes, testing and imaging myself where available.  Lab Results  Component Value Date   WBC 13.9* 10/10/2013   HGB 15.3* 11/10/2013   HCT 45.0 11/10/2013   MCV 91.1 10/10/2013   PLT 316 10/10/2013      Component Value Date/Time   NA 139 11/10/2013 1607   K 4.1 11/10/2013 1607   CL 100 11/10/2013 1607   CO2 26 10/16/2013 1350   GLUCOSE 151* 11/10/2013 1607   BUN 14 11/10/2013 1607   CREATININE 0.80 11/10/2013 1607   CALCIUM 9.9 10/16/2013 1350   PROT 6.8 10/10/2013 0550   ALBUMIN 2.9* 10/10/2013 0550   AST 54* 10/10/2013 0550   ALT 53* 10/10/2013 0550   ALKPHOS 57 10/10/2013 0550   BILITOT 0.5 10/10/2013 0550   GFRNONAA >90  10/16/2013 1350   GFRAA >90 10/16/2013 1350    Lab Results  Component Value Date   HGBA1C 7.6* 10/05/2013   No results found for this basename: VITAMINB12   Lab Results  Component Value Date   TSH 5.154* 10/03/2013      ASSESSMENT AND PLAN  Ethelle A Dimmer is a 33 y.o. 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 return to clinic in one month.    Levert Feinstein, M.D. Ph.D.  [facility] [address] [place], [place] [address] [phone]

## 2014-04-11 ENCOUNTER — Ambulatory Visit
Admission: RE | Admit: 2014-04-11 | Discharge: 2014-04-11 | Disposition: A | Discharge status: Home / Self Care | Payer: Medicaid Other | Source: Ambulatory Visit | Attending: Neurology | Admitting: Neurology

## 2014-04-11 DIAGNOSIS — R269 Unspecified abnormalities of gait and mobility: Secondary | ICD-10-CM

## 2014-04-12 ENCOUNTER — Telehealth: Payer: Self-pay | Admitting: Neurology

## 2014-04-12 ENCOUNTER — Encounter: Payer: Medicaid Other | Admitting: Neurology

## 2014-04-12 NOTE — Telephone Encounter (Signed)
I have called [place], MRI lumbar showed    At L5-S1: far left lateral disc protrusion with potential impingement upon the exiting left L5 root; mild-moderate left foraminal stenosis.  At L4-5: posterior central disc protrusion (77mm); no spinal stenosis or foraminal narrowing.     MRI cervical spine (without) demonstrating:  1. Disc bulging at C4-5 and C7-T1.  2. No spinal stenosis or foraminal narrowing. No intrinsic or compressive spinal cord lesions.  Annabelle Harman, please call her for EMG/NCS appt.

## 2014-04-12 NOTE — Telephone Encounter (Signed)
Check Out will sch. Ncv/emg.

## 2014-04-13 ENCOUNTER — Telehealth: Payer: Self-pay | Admitting: Neurology

## 2014-04-13 NOTE — Telephone Encounter (Signed)
Left message for patient to call back and schedule NCV/EMG per Dr. Terrace ArabiaYan.

## 2014-04-16 ENCOUNTER — Telehealth: Payer: Self-pay | Admitting: Neurology

## 2014-04-16 NOTE — Telephone Encounter (Signed)
Patient's mother called stating that the Tramadol 50 mg doesn't seem to be helping. She is giving her the medication every 4 hours since last week due to pain. Patient cries due to the pain before the next dose is to be expensed. Patient's mother would like to know if there's a stronger pain medication that could be prescribed.

## 2014-04-17 NOTE — Telephone Encounter (Signed)
Pt's mother Babette Relicammy calling stating that pt is still in a lot of pain after taking Tramadol 50 mg, every 4 hours and mother would like to know if Dr. Terrace ArabiaYan could prescribe something stronger for pain. Please advise

## 2014-04-18 ENCOUNTER — Other Ambulatory Visit: Payer: Self-pay | Admitting: Neurology

## 2014-04-18 MED ORDER — OXYCODONE HCL 5 MG PO TABS: 5.0000 mg | ORAL_TABLET | ORAL | Status: DC | PRN

## 2014-04-18 MED ORDER — PREGABALIN 100 MG PO CAPS: 100.0000 mg | ORAL_CAPSULE | Freq: Three times a day (TID) | ORAL | Status: DC

## 2014-04-18 NOTE — Telephone Encounter (Signed)
Pt's Rx was mailed out today,per Dr. Terrace ArabiaYan.

## 2014-04-18 NOTE — Telephone Encounter (Signed)
Alexandra Kelley: I have called her mother, the patient complains of midline low back pain, is taking Lyrica 50 mg 4 tablets a day with mild help, also tramadol 50 mg 3 times a day  I have written higher dose of Lyrica 100 mg 3 times a day oxycodone 5 mg as needed  Please mail the prescription to her home address, Patient should keep that appointment for EMG nerve conduction study ,

## 2014-05-07 ENCOUNTER — Telehealth: Payer: Self-pay | Admitting: Neurology

## 2014-05-07 NOTE — Telephone Encounter (Signed)
We were not aware a prior auth was needed, as this was the first call we have gotten regarding PA.  I have contacted ins at [phone].  Spoke with Pilgrim's PrideMonique.  Provided all requested clinical info.  She said the medical review dept will have to make determination, and sent the info to them.  States they will not contact the patient or us unless the request is denied.  If denied, they will mail a letter within 10 days.  Ref #[id number] P. I called the patient's mom back at home, got no answer, no voicemail.  Called cell, got no answer.  Left message.

## 2014-05-07 NOTE — Telephone Encounter (Signed)
Patient's mother calling regarding the prior approval for the increase in Lyrica. They have had to pay out of pocket and it is very expensive. Please call to advise.

## 2014-05-07 NOTE — Telephone Encounter (Signed)
Please advise 

## 2014-05-09 NOTE — Telephone Encounter (Signed)
I called ins back to follow up.  Spoke with Julianne.  She said the prior Berkley Harveyauth has been approved effective until 05/02/2015.

## 2014-05-10 ENCOUNTER — Encounter (INDEPENDENT_AMBULATORY_CARE_PROVIDER_SITE_OTHER): Payer: Self-pay

## 2014-05-10 ENCOUNTER — Encounter: Payer: Medicaid Other | Admitting: Neurology

## 2014-05-10 ENCOUNTER — Ambulatory Visit (INDEPENDENT_AMBULATORY_CARE_PROVIDER_SITE_OTHER): Payer: Medicaid Other | Admitting: Neurology

## 2014-05-10 DIAGNOSIS — E1144 Type 2 diabetes mellitus with diabetic amyotrophy: Secondary | ICD-10-CM | POA: Insufficient documentation

## 2014-05-10 DIAGNOSIS — E1149 Type 2 diabetes mellitus with other diabetic neurological complication: Secondary | ICD-10-CM

## 2014-05-10 DIAGNOSIS — Z0289 Encounter for other administrative examinations: Secondary | ICD-10-CM

## 2014-05-10 DIAGNOSIS — R269 Unspecified abnormalities of gait and mobility: Secondary | ICD-10-CM

## 2014-05-10 DIAGNOSIS — G541 Lumbosacral plexus disorders: Secondary | ICD-10-CM

## 2014-05-10 DIAGNOSIS — E08610 Diabetes mellitus due to underlying condition with diabetic neuropathic arthropathy: Secondary | ICD-10-CM

## 2014-05-10 DIAGNOSIS — E1349 Other specified diabetes mellitus with other diabetic neurological complication: Secondary | ICD-10-CM

## 2014-05-10 DIAGNOSIS — R209 Unspecified disturbances of skin sensation: Secondary | ICD-10-CM

## 2014-05-10 MED ORDER — TRAMADOL HCL 50 MG PO TABS: 50.0000 mg | ORAL_TABLET | Freq: Four times a day (QID) | ORAL | Status: DC | PRN

## 2014-05-10 MED ORDER — OXYCODONE HCL 5 MG PO TABS: 5.0000 mg | ORAL_TABLET | ORAL | Status: DC | PRN

## 2014-05-10 MED ORDER — ZOLPIDEM TARTRATE 5 MG PO TABS
5.0000 mg | ORAL_TABLET | Freq: Every evening | ORAL | Status: DC | PRN
Start: 2014-05-10 — End: 2014-08-09

## 2014-05-10 NOTE — Procedures (Signed)
   NCS (NERVE CONDUCTION STUDY) WITH EMG (ELECTROMYOGRAPHY) REPORT   STUDY DATE: July 9th 2015 PATIENT NAME: Alexandra MerlSabrina A Chesney DOB: 08-22-1981 MRN: [id number]    TECHNOLOGIST: Gearldine ShownLorraine Jones ELECTROMYOGRAPHER: Levert FeinsteinYan, Linn Goetze M.D.  CLINICAL INFORMATION:   33 years old right handed female with PMhx of DM, had left femur fracture, s/p surgical fixation, presenting with subacute onset of bilateral feet, distal leg numbness, pain, weakness, worsening gait difficulty.  FINDINGS: NERVE CONDUCTION STUDY: Bilateral peroneal sensory responses were absent.  Right peroneal to EDB, bilateral tibial motor responses showed severely decreased CMAP, with low normal conduction velocity,distal latency. Right peroneal motor responses were normal. Bilateral ulnar sensory and motor responses were normal.  Bilateral median sensory responses showed mildly prolonged peak latency, with normal SNAP amplitude.  Bilateral median motor responses showed mildly prolonged distal latency,left worse than right, with normal CMAP amplitude, conduction velocity.  NEEDLE ELECTROMYOGRAPHY: Selected needle exam were performed at bilateral lower extremity muscles and bilateral lumbar paraspinals.  Right tibialis anterior,peroneal longus: increased insertion activities,2+ spontaneous activities, enlarged complex motor unit potential with decreased recruitment patterns.  Right tibialis posterior, medial gastroneumis: increased insertion activities,1+ spontaneous activities, enlarged complex motor unit potential with decreased recruitment patterns. Right vastus lateralis, femoris rectus:  increased insertion activities, no spontaneous activities, enlarged complex motor unit potential with decreased recruitment patterns. Right biceps femoris longus: increased insertion activities, no spontaneous activities, enlarged complex motor unit potential with decreased recruitment patterns. Right Gluteus maximum:  normal insertion  activities,nospontaneous activities, normal motor unit potential with mildly decreased recruitment patterns.   Left tibialis anterior,peroneal longus: increased insertion activities,1+ spontaneous activities, enlarged complex motor unit potential with decreased recruitment patterns.  Left tibialis posterior, medial gastroneumis: increased insertion activities,2+ spontaneous activities, enlarged complex motor unit potential with decreased recruitment patterns. Left vastus lateralis:  increased insertion activities, no spontaneous activities, normal complex motor unit potential with mildly decreased recruitment patterns. Left biceps femoris longus: increased insertion activities, 2+ spontaneous activities, enlarged complex motor unit potential with decreased recruitment patterns. Left Gluteus maximum:  normal insertion activities,nospontaneous activities, normal motor unit potential with mildly decreased recruitment patterns.  There was no spontaneous activities at bilateral lumbar paraspinals, including bilateral L4, 5, S1.  IMPRESSION:   This is a marked abnormal study.  There is evidence of active neuropathic changes involving bilateral lumbosacral myotomes, right side L4, 5 more than S1, left side S1 more than L5, 4.  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  INTERPRETING PHYSICIAN:   Levert FeinsteinYan, Kelcee Bjorn M.D. Ph.D. [facility] [address], [address] [place], [place] [address] [phone]

## 2014-05-10 NOTE — Addendum Note (Signed)
Addended by: Levert FeinsteinYAN, Patrizia Paule on: 05/10/2014 11:08 PM   Modules accepted: Level of Service

## 2014-05-10 NOTE — Progress Notes (Signed)
PATIENT: Alexandra Kelley DOB: October 09, 1981  HISTORICAL  Alexandra Kelley is a 33 years old right-handed Caucasian female, accompanied by her parents, referred by podiatrist Dr. Ferman Hamming, and primary care physician Dr. Phillips Odor for evaluation of bilateral lower extremity pain  She had past medical history of diabetes, obesity, mental retardation, she was a full-term, but with mental delay, was able to special education, had a high school graduate, at baseline, she ambulate without assistance, able to help with simple house chores  She fell, broke her left femur in December 2014, x-ray showed comminuted predominantly oblique fracture through the mid and distal  left femoral shaft. She underwent retrograde femoral nail with interlocks. By Dr. Ronnald Nian in October 03 2013 under general anesthesia, plus local anesthesia  She was discharged to home in October 20 2013, able to ambulate with walker, but denies significant left femur, or left hip pain,  Around January 2015, she began to complains of bilateral feet, lower extremity paresthesia, burning pain, from knee down, worsening gait difficulty, could not pick up her right foot off the floor,  Bilateral lower extremity arterial Doppler study in a poor 6 2016 showed no evidence of hemodynamic vessel occlusive disease at bilateral lower extremity, elevated ABIs bilaterally likely: secondary to distal small vessel calcification, most commonly attributed to her previous history of diabetes  Bilateral lower extremity venous Doppler ultrasound showed no evidence of deep venous thrombosis in either lower extremity  Most recent laboratory evaluation in January 2015, showed normal CBC, BMP, with exception of elevated glucose 139  She is now taking tramadol 50 mg every 6 hours, Lyrica 50 mg 3 times a day, helps her some  She also complains of bilateral fingertips paresthesia, but denies significant upper extremity weakness  UPDATE July 9th  2015: She continues to have significant gait difficulty,bilateral lower extremity numbness, weakness, right worse than left, no significant left hip pain.  EMG/NCS showed active denervation at bilateral lower extremities, involving bilateral L4,5,S1 myotomes, more of right L4-5, left S1, but no active denervation at bilateral lumbar paraspinal muscles, with absent bilateral peroneal sensory responses, above findings support diabetic amyotrophy.   We also reviewed MRI lumbar,  MRI lumbar spine (without) demonstrating:  1. At L5-S1: far left lateral disc protrusion with potential impingement upon the exiting left L5 root; mild-moderate left foraminal stenosis.  2. At L4-5: posterior central disc protrusion (4mm); no spinal stenosis or foraminal narrowing.  MRI cervical: Disc bulging at C4-5 and C7-T1.  No spinal stenosis or foraminal narrowing. No intrinsic or compressive spinal cord lesions.  She denies bowel and bladder incontinence.  REVIEW OF SYSTEMS: Full 14 system review of systems performed and notable only for bilateral upper and lower extremity paresthesia, leg weakness, gait difficulty  ALLERGIES: Allergies  Allergen Reactions  . Ceclor [Cefaclor]     Rash  . Invokana [Canagliflozin]     Severe yeast infections  . Metformin And Related     GI distress  . Onglyza [Saxagliptin]     headaches  . Penicillins Rash    HOME MEDICATIONS: Current Outpatient Prescriptions on File Prior to Visit  Medication Sig Dispense Refill  . diphenhydrAMINE-zinc acetate (BENADRYL) cream Apply topically as needed for itching.  28.4 g  0  . fluticasone (FLONASE) 50 MCG/ACT nasal spray Place 2 sprays into the nose daily.      . furosemide (LASIX) 20 MG tablet Take 20 mg by mouth as needed.      Marland Kitchen glimepiride (AMARYL) 4  MG tablet Take 4 mg by mouth 2 (two) times daily.      Marland Kitchen levothyroxine (SYNTHROID, LEVOTHROID) 137 MCG tablet Take 137 mcg by mouth daily.      Marland Kitchen loratadine (CLARITIN) 10 MG tablet  Take 10 mg by mouth daily.      . methocarbamol (ROBAXIN) 500 MG tablet Take 2 tablets (1,000 mg total) by mouth 4 (four) times daily.  200 tablet  0  . omeprazole (PRILOSEC) 20 MG capsule Take 20 mg by mouth daily.      Marland Kitchen oxyCODONE (OXY IR/ROXICODONE) 5 MG immediate release tablet Take 1 tablet (5 mg total) by mouth every 4 (four) hours as needed for severe pain or breakthrough pain.  30 tablet  0  . pregabalin (LYRICA) 100 MG capsule Take 1 capsule (100 mg total) by mouth 3 (three) times daily.  90 capsule  5  . pregabalin (LYRICA) 50 MG capsule Take 50 mg by mouth 3 (three) times daily.      . traMADol (ULTRAM) 50 MG tablet Take by mouth every 6 (six) hours as needed.       No current facility-administered medications on file prior to visit.    PAST MEDICAL HISTORY: Past Medical History  Diagnosis Date  . Diabetes mellitus without complication     Dr. Lurene Shadow  . Thyroid disease   . GERD (gastroesophageal reflux disease)     PAST SURGICAL HISTORY: Past Surgical History  Procedure Laterality Date  . Tubes in ears    . Back surgery    . Femur im nail Left 10/03/2013    Procedure: INTRAMEDULLARY (IM) RETROGRADE FEMORAL NAILING;  Surgeon: Eldred Manges, MD;  Location: [facility] OR;  Service: Orthopedics;  Laterality: Left;    FAMILY HISTORY: Family History  Problem Relation Age of Onset  . Hypertension Mother   . Thyroid disease Mother     SOCIAL HISTORY:  History   Social History  . Marital Status: Single    Spouse Name: N/A    Number of Children: N/A  . Years of Education: 12   Occupational History  . Not on file.   Social History Main Topics  . Smoking status: Never Smoker   . Smokeless tobacco: Never Used  . Alcohol Use: No  . Drug Use: No  . Sexual Activity: Not on file   Other Topics Concern  . Not on file   Social History Narrative   Patient lives at home with her parents Alexandra Kelley and Alexandra Kelley.   Patient has a high school certificate.   Right handed.   Caffeine  coke cola sometimes.     PHYSICAL EXAM   There were no vitals filed for this visit.  Not recorded    There is no weight on file to calculate BMI.   Generalized: In no acute distress  Neck: Supple, no carotid bruits   Cardiac: Regular rate rhythm  Pulmonary: Clear to auscultation bilaterally  Musculoskeletal: No deformity  Neurological examination  Mentation: obese, cooperative  Cranial nerve II-XII: Pupils were equal round reactive to light. Extraocular movements were full.  Visual field were full on confrontational test. Bilateral fundi were sharp.  Facial sensation and strength were normal. Hearing was intact to finger rubbing bilaterally. Uvula tongue midline.  Head turning and shoulder shrug and were normal and symmetric.Tongue protrusion into cheek strength was normal.  Motor: mild ankle dorsiflexion 4/5-, toe extension weakness 4/5-, toe flexion 4/5-, eversion 4/5, inversion 4-/5  Sensory: Length dependent fine touch, pinprick to mid  shin level, preserved toe vibratory sensation, and proprioception at toes.  Coordination: Normal finger to nose, heel-to-shin bilaterally there was no truncal ataxia  Gait: Relies on her walker, has right foot drop, could not stand up on tiptoe, or heels. She has big body habitus, unsteady gait  Romberg signs: Negative  Deep tendon reflexes: Brachioradialis 2/2, biceps 2/2, triceps 2/2, patellar 1/2, Achilles absent, plantar responses were flexor bilaterally.   DIAGNOSTIC DATA (LABS, IMAGING, TESTING) - I reviewed patient records, labs, notes, testing and imaging myself where available.  Lab Results  Component Value Date   WBC 13.9* 10/10/2013   HGB 15.3* 11/10/2013   HCT 45.0 11/10/2013   MCV 91.1 10/10/2013   PLT 316 10/10/2013      Component Value Date/Time   NA 139 11/10/2013 1607   K 4.1 11/10/2013 1607   CL 100 11/10/2013 1607   CO2 26 10/16/2013 1350   GLUCOSE 151* 11/10/2013 1607   BUN 14 11/10/2013 1607   CREATININE 0.80  11/10/2013 1607   CALCIUM 9.9 10/16/2013 1350   PROT 6.8 10/10/2013 0550   ALBUMIN 2.9* 10/10/2013 0550   AST 54* 10/10/2013 0550   ALT 53* 10/10/2013 0550   ALKPHOS 57 10/10/2013 0550   BILITOT 0.5 10/10/2013 0550   GFRNONAA >90 10/16/2013 1350   GFRAA >90 10/16/2013 1350    Lab Results  Component Value Date   HGBA1C 7.6* 10/05/2013   No results found for this basename: VITAMINB12   Lab Results  Component Value Date   TSH 5.154* 10/03/2013     ASSESSMENT AND PLAN  Alexandra Kelley is a 33 y.o. female complains with past medical history of diabetes, obesity, recent surgery for left femur fracture, now presenting with bilateral lower extremity paresthesia and weakness, right more than left leg, gait difficulty,  bilateral upper and lower extremity paresthesia,  Today's electrodiagnostic study showed evidence of bilateral lumbar plexopathy, most suggestive of diabetic amyotrophy, right L4, 5, more than S1, Left S1 more that L5.  MRI thoracic spine to rule out cauda equina syndrome. Home PT Return to clinic in 2 months. Continue Lyrica, tramadol, percocet prn for pain   Levert FeinsteinYijun Lilliauna Van, M.D. Ph.D.  [facility] [address], [address] [place], [place] [address] [phone]

## 2014-05-22 ENCOUNTER — Inpatient Hospital Stay: Admission: RE | Admit: 2014-05-22 | Payer: Medicaid Other | Source: Ambulatory Visit

## 2014-05-30 ENCOUNTER — Ambulatory Visit
Admission: RE | Admit: 2014-05-30 | Discharge: 2014-05-30 | Disposition: A | Discharge status: Home / Self Care | Payer: Medicaid Other | Source: Ambulatory Visit | Attending: Neurology | Admitting: Neurology

## 2014-05-30 DIAGNOSIS — G541 Lumbosacral plexus disorders: Secondary | ICD-10-CM

## 2014-05-30 DIAGNOSIS — E1144 Type 2 diabetes mellitus with diabetic amyotrophy: Secondary | ICD-10-CM

## 2014-05-30 DIAGNOSIS — E08610 Diabetes mellitus due to underlying condition with diabetic neuropathic arthropathy: Secondary | ICD-10-CM

## 2014-05-31 ENCOUNTER — Telehealth: Payer: Self-pay | Admitting: Neurology

## 2014-05-31 NOTE — Telephone Encounter (Signed)
Please call patient, MRI of thoracic spine showed no significant abnormalities,

## 2014-05-31 NOTE — Telephone Encounter (Signed)
Called pt to inform her per Dr. Terrace ArabiaYan that the pt's MRI of the thoracic spine showed no significant abnormalities and if she has any other problems, questions or concerns to call the office. Pt verbalized understanding.

## 2014-06-26 ENCOUNTER — Telehealth: Payer: Self-pay | Admitting: Neurology

## 2014-06-26 NOTE — Telephone Encounter (Signed)
Called and spoke to patient's mother resent referral for Home PT [facility]) . Patient's mother states that Oxycodone  is not helping she needs something else because she is crying at night when it's time to go bed.  Explained to mother Dr.Yan was out of the office today and she stated ok to wait for Dr.Yan to return 06-27-2014. Mother was fine to wait.

## 2014-06-26 NOTE — Telephone Encounter (Signed)
States home physical therapy and they have not heard anything. States pt has a herniated disc and has been crying for the last week in pain

## 2014-06-27 NOTE — Telephone Encounter (Signed)
I called fail to reach pt and family, she has followup appointments September tenth, we will discuss her medication then

## 2014-07-12 ENCOUNTER — Ambulatory Visit: Payer: Medicaid Other | Admitting: Neurology

## 2014-07-27 ENCOUNTER — Ambulatory Visit: Payer: Medicaid Other | Admitting: Neurology

## 2014-07-27 ENCOUNTER — Telehealth: Payer: Self-pay | Admitting: Neurology

## 2014-07-27 NOTE — Telephone Encounter (Signed)
Patient's mother is calling. Patient is having problems with her back hurting more than before. Patient mother says it hurts so much it makes patient cry. Please call. If no answer please leave a message.

## 2014-07-31 ENCOUNTER — Ambulatory Visit: Payer: Medicaid Other | Admitting: Neurology

## 2014-07-31 NOTE — Telephone Encounter (Signed)
I failed to reach her family, left message, will see her in clinic Oct 8th

## 2014-08-09 ENCOUNTER — Encounter: Payer: Self-pay | Admitting: Neurology

## 2014-08-09 ENCOUNTER — Ambulatory Visit (INDEPENDENT_AMBULATORY_CARE_PROVIDER_SITE_OTHER): Payer: Medicaid Other | Admitting: Neurology

## 2014-08-09 VITALS — BP 119/72 | HR 66 | Ht 61.0 in | Wt 300.0 lb

## 2014-08-09 DIAGNOSIS — E08610 Diabetes mellitus due to underlying condition with diabetic neuropathic arthropathy: Secondary | ICD-10-CM

## 2014-08-09 DIAGNOSIS — E1144 Type 2 diabetes mellitus with diabetic amyotrophy: Secondary | ICD-10-CM

## 2014-08-09 DIAGNOSIS — R269 Unspecified abnormalities of gait and mobility: Secondary | ICD-10-CM

## 2014-08-09 DIAGNOSIS — G541 Lumbosacral plexus disorders: Secondary | ICD-10-CM

## 2014-08-09 MED ORDER — NORTRIPTYLINE HCL 25 MG PO CAPS: ORAL_CAPSULE | ORAL | Status: DC

## 2014-08-09 MED ORDER — TRAMADOL HCL 50 MG PO TABS: 50.0000 mg | ORAL_TABLET | Freq: Four times a day (QID) | ORAL | Status: DC | PRN

## 2014-08-09 NOTE — Progress Notes (Signed)
PATIENT: Alexandra Kelley DOB: September 19, 1981  HISTORICAL  Alexandra Kelley is a 33 years old right-handed Caucasian female, accompanied by her parents, referred by podiatrist Dr. Ferman HammingBenjamin McKinney, and primary care physician Dr. Phillips OdorGolding for evaluation of bilateral lower extremity pain  She had past medical history of diabetes, obesity, mental retardation, she was a full-term, but with mental delay, was able to special education, had a high school graduate, at baseline, she ambulate without assistance, able to help with simple house chores.  She fell, broke her left femur in December 2014, x-ray showed comminuted predominantly oblique fracture through the mid and distal  left femoral shaft. She underwent retrograde femoral nail with interlocks. By Dr. Ronnald NianMarquis in October 03 2013 under general anesthesia, plus local anesthesia  She was discharged to home in October 20 2013, able to ambulate with walker, but denies significant left femur, or left hip pain,  Around January 19th 2015, she began to complains of bilateral feet, lower extremity paresthesia, burning pain, from knee down, worsening gait difficulty, could not pick up her right foot off the floor,  Bilateral lower extremity arterial Doppler study in a poor 6 2016 showed no evidence of hemodynamic vessel occlusive disease at bilateral lower extremity, elevated ABIs bilaterally likely: secondary to distal small vessel calcification, most commonly attributed to her previous history of diabetes  Bilateral lower extremity venous Doppler ultrasound showed no evidence of deep venous thrombosis in either lower extremity  Most recent laboratory evaluation in January 2015, showed normal CBC, BMP, with exception of elevated glucose 139  She is now taking tramadol 50 mg every 6 hours, Lyrica 50 mg 3 times a day, helps her some  She also complains of bilateral fingertips paresthesia, but denies significant upper extremity weakness   EMG/NCS  showed active denervation at bilateral lower extremities, involving bilateral L4,5,S1 myotomes, more of right L4-5, left S1, but no active denervation at bilateral lumbar paraspinal muscles, with absent bilateral peroneal sensory responses, above findings support diabetic amyotrophy.   We also reviewed MRI lumbar,  MRI lumbar spine (without) demonstrating:  1. At L5-S1: far left lateral disc protrusion with potential impingement upon the exiting left L5 root; mild-moderate left foraminal stenosis.  2. At L4-5: posterior central disc protrusion (4mm); no spinal stenosis or foraminal narrowing.  MRI cervical: Disc bulging at C4-5 and C7-T1.  No spinal stenosis or foraminal narrowing. No intrinsic or compressive spinal cord lesions. MRI thoracic showed no significant stenosis She denies bowel and bladder incontinence.  UPDATE Oct 8th 2015: She came in with her parents today, continues to complains of low back, leg and feet pain, reported 7 out of 10 on average, sometimes up to 10 out of 10, 25% of time to the point of tearing, has difficulty sleeping tonight because of the pain,  She has difficulty walking because of the pain, overall, her walking has improved, she no longer require a walker, has stronger right ankle, using a cane now,  She she denies no bowel bladder incontinence,  She is taking Lyrica 100 mg 3 times a day, tramadol 50 mg every 6 hours as needed  REVIEW OF SYSTEMS: Full 14 system review of systems performed and notable only for  ALLERGIES: Allergies  Allergen Reactions  . Ceclor [Cefaclor]     Rash  . Invokana [Canagliflozin]     Severe yeast infections  . Metformin And Related     GI distress  . Onglyza [Saxagliptin]     headaches  .  Penicillins Rash    HOME MEDICATIONS: Current Outpatient Prescriptions on File Prior to Visit  Medication Sig Dispense Refill  . diphenhydrAMINE-zinc acetate (BENADRYL) cream Apply topically as needed for itching.  28.4 g  0  .  fluticasone (FLONASE) 50 MCG/ACT nasal spray Place 2 sprays into the nose daily.      . furosemide (LASIX) 20 MG tablet Take 20 mg by mouth as needed.      Marland Kitchen glimepiride (AMARYL) 4 MG tablet Take 4 mg by mouth 2 (two) times daily.      Marland Kitchen levothyroxine (SYNTHROID, LEVOTHROID) 137 MCG tablet Take 137 mcg by mouth daily.      Marland Kitchen loratadine (CLARITIN) 10 MG tablet Take 10 mg by mouth daily.      Marland Kitchen omeprazole (PRILOSEC) 20 MG capsule Take 20 mg by mouth daily.      . pregabalin (LYRICA) 100 MG capsule Take 1 capsule (100 mg total) by mouth 3 (three) times daily.  90 capsule  5  . traMADol (ULTRAM) 50 MG tablet Take 1 tablet (50 mg total) by mouth every 6 (six) hours as needed.  120 tablet  5   No current facility-administered medications on file prior to visit.    PAST MEDICAL HISTORY: Past Medical History  Diagnosis Date  . Diabetes mellitus without complication     Dr. Lurene Shadow  . Thyroid disease   . GERD (gastroesophageal reflux disease)     PAST SURGICAL HISTORY: Past Surgical History  Procedure Laterality Date  . Tubes in ears    . Back surgery    . Femur im nail Left 10/03/2013    Procedure: INTRAMEDULLARY (IM) RETROGRADE FEMORAL NAILING;  Surgeon: Eldred Manges, MD;  Location: [facility] OR;  Service: Orthopedics;  Laterality: Left;    FAMILY HISTORY: Family History  Problem Relation Age of Onset  . Hypertension Mother   . Thyroid disease Mother     SOCIAL HISTORY:  History   Social History  . Marital Status: Single    Spouse Name: N/A    Number of Children: N/A  . Years of Education: 12   Occupational History  . Not on file.   Social History Main Topics  . Smoking status: Never Smoker   . Smokeless tobacco: Never Used  . Alcohol Use: No  . Drug Use: No  . Sexual Activity: Not on file   Other Topics Concern  . Not on file   Social History Narrative   Patient lives at home with her parents [place] and [place].   Patient has a high school certificate.   Right handed.    Caffeine coke cola sometimes.     PHYSICAL EXAM   Filed Vitals:   08/09/14 1342  BP: 119/72  Pulse: 66  Height: 5\' 1"  (1.549 m)  Weight: 300 lb (136.079 kg)    Not recorded    Body mass index is 56.71 kg/(m^2).   Generalized: In no acute distress  Neck: Supple, no carotid bruits   Cardiac: Regular rate rhythm  Pulmonary: Clear to auscultation bilaterally  Musculoskeletal: No deformity  Neurological examination  Mentation: obese, cooperative  Cranial nerve II-XII: Pupils were equal round reactive to light. Extraocular movements were full.  Visual field were full on confrontational test. Bilateral fundi were sharp.  Facial sensation and strength were normal. Hearing was intact to finger rubbing bilaterally. Uvula tongue midline.  Head turning and shoulder shrug and were normal and symmetric.Tongue protrusion into cheek strength was normal.  Motor: mild ankle dorsiflexion  4/5-, toe extension weakness 4/5-, toe flexion 4/5-, eversion 4/5, inversion 4/5  Sensory: Length dependent fine touch, pinprick to mid shin level, preserved toe vibratory sensation, and proprioception at toes.  Coordination: Normal finger to nose, heel-to-shin bilaterally there was no truncal ataxia  Gait: She has mild right foot drop, could not stand up on tiptoe, or heels. She has big body habitus, unsteady gait  Romberg signs: Negative  Deep tendon reflexes: Brachioradialis 2/2, biceps 2/2, triceps 2/2, patellar 1/2, Achilles absent, plantar responses were flexor bilaterally.   DIAGNOSTIC DATA (LABS, IMAGING, TESTING) - I reviewed patient records, labs, notes, testing and imaging myself where available.  Lab Results  Component Value Date   WBC 13.9* 10/10/2013   HGB 15.3* 11/10/2013   HCT 45.0 11/10/2013   MCV 91.1 10/10/2013   PLT 316 10/10/2013      Component Value Date/Time   NA 139 11/10/2013 1607   K 4.1 11/10/2013 1607   CL 100 11/10/2013 1607   CO2 26 10/16/2013 1350   GLUCOSE 151*  11/10/2013 1607   BUN 14 11/10/2013 1607   CREATININE 0.80 11/10/2013 1607   CALCIUM 9.9 10/16/2013 1350   PROT 6.8 10/10/2013 0550   ALBUMIN 2.9* 10/10/2013 0550   AST 54* 10/10/2013 0550   ALT 53* 10/10/2013 0550   ALKPHOS 57 10/10/2013 0550   BILITOT 0.5 10/10/2013 0550   GFRNONAA >90 10/16/2013 1350   GFRAA >90 10/16/2013 1350    Lab Results  Component Value Date   HGBA1C 7.6* 10/05/2013   No results found for this basename: VITAMINB12   Lab Results  Component Value Date   TSH 5.154* 10/03/2013     ASSESSMENT AND PLAN  Teffany A Gewirtz is a 33 y.o. female complains with past medical history of diabetes, obesity, recent surgery for left femur fracture, now presenting with bilateral lower extremity paresthesia and weakness, right more than left leg, gait difficulty,  bilateral upper and lower extremity paresthesia,  Today's electrodiagnostic study showed evidence of bilateral lumbar plexopathy, most suggestive of diabetic amyotrophy, right L4, 5, more than S1, Left S1 more that L5.   Patient and her parents, especially her father is very frustrated about her continued complaints of bilateral back pain, lower extremity deep achy pain, I have reviewed MR spine again with her parents, explaining the diagnosis of diabetic amyotrophy, involving bilateral lumbar sacral plexopathy,, she overall continues to make progress, gait has much improved, graduate from walker to a four feet cane.  1. The neuropathic pain, usually linger around a bit longer, which is also complicated by her morbid obesity, sedentary lifestyle, likely a neuromuscular component. 2. add on nortriptyline 25 mg every night, titrating to 2 tablets every night, in combination with current tramadol 3 times a day, Lyrica 100 mg 3 times a day 3. Return to clinic in couple months, I have encouraged her moderate exercise, gait training daily  60 minutes spent in coordinating her care, more than 50 percent was face-to-face  consultations Alexandra Kelley, M.D. Ph.D.  [facility] [address], [address] [place], [place] [address] [phone] and

## 2014-09-04 ENCOUNTER — Other Ambulatory Visit: Payer: Self-pay

## 2014-09-04 MED ORDER — PREGABALIN 100 MG PO CAPS: 100.0000 mg | ORAL_CAPSULE | Freq: Three times a day (TID) | ORAL | Status: DC

## 2014-09-05 ENCOUNTER — Telehealth: Payer: Self-pay | Admitting: Neurology

## 2014-09-05 NOTE — Telephone Encounter (Signed)
I called back.  Got no answer.  Left message.  

## 2014-09-05 NOTE — Telephone Encounter (Signed)
Patient's mother stated Pharmacy faxed Rx refill request for pregabalin (LYRICA) 100 MG capsule, pharmacy stated that hadn't received Rx.  Please call and advise.

## 2014-09-18 ENCOUNTER — Ambulatory Visit: Payer: Medicaid Other | Admitting: Neurology

## 2014-10-22 ENCOUNTER — Ambulatory Visit (INDEPENDENT_AMBULATORY_CARE_PROVIDER_SITE_OTHER): Payer: Medicaid Other | Admitting: Neurology

## 2014-10-22 ENCOUNTER — Encounter: Payer: Self-pay | Admitting: Neurology

## 2014-10-22 VITALS — BP 122/85 | HR 72 | Ht 61.0 in | Wt 300.0 lb

## 2014-10-22 DIAGNOSIS — G541 Lumbosacral plexus disorders: Secondary | ICD-10-CM

## 2014-10-22 DIAGNOSIS — E1144 Type 2 diabetes mellitus with diabetic amyotrophy: Secondary | ICD-10-CM

## 2014-10-22 DIAGNOSIS — E08610 Diabetes mellitus due to underlying condition with diabetic neuropathic arthropathy: Secondary | ICD-10-CM

## 2014-10-22 DIAGNOSIS — R269 Unspecified abnormalities of gait and mobility: Secondary | ICD-10-CM

## 2014-10-22 MED ORDER — TRAMADOL HCL 50 MG PO TABS: 50.0000 mg | ORAL_TABLET | Freq: Four times a day (QID) | ORAL | Status: DC | PRN

## 2014-10-22 MED ORDER — NORTRIPTYLINE HCL 25 MG PO CAPS: ORAL_CAPSULE | ORAL | Status: DC

## 2014-10-22 MED ORDER — PREGABALIN 100 MG PO CAPS: 100.0000 mg | ORAL_CAPSULE | Freq: Three times a day (TID) | ORAL | Status: DC

## 2014-10-22 NOTE — Progress Notes (Signed)
PATIENT: Alexandra Kelley DOB: [birth year]  HISTORICAL  [name] A Sargent is a 33 years old right-handed Caucasian female, accompanied by her parents, referred by podiatrist Dr. Ferman HammingBenjamin McKinney, and primary care physician Dr. Phillips OdorGolding for evaluation of bilateral lower extremity pain  She had past medical history of diabetes, obesity, mental retardation, she was a full-term, but with mental delay, was able to special education, had a high school graduate, at baseline, she ambulate without assistance, able to help with simple house chores.  She fell, broke her left femur in December 2014, x-ray showed comminuted predominantly oblique fracture through the mid and distal  left femoral shaft. She underwent retrograde femoral nail with interlocks. By Dr. Ronnald NianMarquis in October 03 2013 under general anesthesia, plus local anesthesia  She was discharged to home in October 20 2013, able to ambulate with walker, but denies significant left femur, or left hip pain,  Around January 19th 2015, she began to complains of bilateral feet, lower extremity paresthesia, burning pain, from knee down, worsening gait difficulty, could not pick up her right foot off the floor,  Bilateral lower extremity arterial Doppler study in a poor 6 2016 showed no evidence of hemodynamic vessel occlusive disease at bilateral lower extremity, elevated ABIs bilaterally likely: secondary to distal small vessel calcification, most commonly attributed to her previous history of diabetes  Bilateral lower extremity venous Doppler ultrasound showed no evidence of deep venous thrombosis in either lower extremity  Most recent laboratory evaluation in January 2015, showed normal CBC, BMP, with exception of elevated glucose 139  She is now taking tramadol 50 mg every 6 hours, Lyrica 50 mg 3 times a day, helps her some  She also complains of bilateral fingertips paresthesia, but denies significant upper extremity weakness   EMG/NCS  showed active denervation at bilateral lower extremities, involving bilateral L4,5,S1 myotomes, more of right L4-5, left S1, but no active denervation at bilateral lumbar paraspinal muscles, with absent bilateral peroneal sensory responses, above findings support diabetic amyotrophy.   We also reviewed MRI lumbar,  MRI lumbar spine (without) demonstrating:  1. At L5-S1: far left lateral disc protrusion with potential impingement upon the exiting left L5 root; mild-moderate left foraminal stenosis.  2. At L4-5: posterior central disc protrusion (4mm); no spinal stenosis or foraminal narrowing.  MRI cervical: Disc bulging at C4-5 and C7-T1.  No spinal stenosis or foraminal narrowing. No intrinsic or compressive spinal cord lesions. MRI thoracic showed no significant stenosis She denies bowel and bladder incontinence.  UPDATE Oct 8th 2015: She came in with her parents today, continues to complains of low back, leg and feet pain, reported 7 out of 10 on average, sometimes up to 10 out of 10, 25% of time to the point of tearing, has difficulty sleeping tonight because of the pain,  She has difficulty walking because of the pain, overall, her walking has improved, she no longer require a walker, has stronger right ankle, using a cane now,  She she denies no bowel bladder incontinence,  She is taking Lyrica 100 mg 3 times a day, tramadol 50 mg every 6 hours as needed  UPDATE Oct 22 2014: She sleeps better, wth nortriptuline 25mg  2 tab qhs, she still complains of back pain,  Left lateral leg pain.  She is not active, sitting down most of times   REVIEW OF SYSTEMS: Full 14 system review of systems performed and notable only for back pain, headaches, heat, cold intolerance  ALLERGIES: Allergies  Allergen Reactions  .  Ceclor [Cefaclor]     Rash  . Invokana [Canagliflozin]     Severe yeast infections  . Metformin And Related     GI distress  . Onglyza [Saxagliptin]     headaches  .  Penicillins Rash    HOME MEDICATIONS: Current Outpatient Prescriptions on File Prior to Visit  Medication Sig Dispense Refill  . diphenhydrAMINE-zinc acetate (BENADRYL) cream Apply topically as needed for itching. 28.4 g 0  . fluticasone (FLONASE) 50 MCG/ACT nasal spray Place 2 sprays into the nose daily.    . furosemide (LASIX) 20 MG tablet Take 20 mg by mouth as needed.    Marland Kitchen. glimepiride (AMARYL) 4 MG tablet Take 4 mg by mouth 2 (two) times daily.    Marland Kitchen. levothyroxine (SYNTHROID, LEVOTHROID) 137 MCG tablet Take 137 mcg by mouth daily.    Marland Kitchen. loratadine (CLARITIN) 10 MG tablet Take 10 mg by mouth daily.    . nortriptyline (PAMELOR) 25 MG capsule One po qhs xone week, then 2 tabs po qhs 60 capsule 11  . omeprazole (PRILOSEC) 20 MG capsule Take 20 mg by mouth daily.    . pregabalin (LYRICA) 100 MG capsule Take 1 capsule (100 mg total) by mouth 3 (three) times daily. 90 capsule 5  . traMADol (ULTRAM) 50 MG tablet Take 1 tablet (50 mg total) by mouth every 6 (six) hours as needed. 120 tablet 5   No current facility-administered medications on file prior to visit.    PAST MEDICAL HISTORY: Past Medical History  Diagnosis Date  . Diabetes mellitus without complication     Dr. Lurene ShadowBallen  . Thyroid disease   . GERD (gastroesophageal reflux disease)     PAST SURGICAL HISTORY: Past Surgical History  Procedure Laterality Date  . Tubes in ears    . Back surgery    . Femur im nail Left 10/03/2013    Procedure: INTRAMEDULLARY (IM) RETROGRADE FEMORAL NAILING;  Surgeon: Eldred MangesMark C Yates, MD;  Location: [facility] OR;  Service: Orthopedics;  Laterality: Left;    FAMILY HISTORY: Family History  Problem Relation Age of Onset  . Hypertension Mother   . Thyroid disease Mother     SOCIAL HISTORY:  History   Social History  . Marital Status: Single    Spouse Name: N/A    Number of Children: N/A  . Years of Education: 12   Occupational History  . Not on file.   Social History Main Topics  . Smoking  status: Never Smoker   . Smokeless tobacco: Never Used  . Alcohol Use: No  . Drug Use: No  . Sexual Activity: Not on file   Other Topics Concern  . Not on file   Social History Narrative   Patient lives at home with her parents [place] and [place].   Patient has a high school certificate.   Right handed.   Caffeine coke cola sometimes.     PHYSICAL EXAM   Filed Vitals:   10/22/14 1343  BP: 122/85  Pulse: 72  Height: 5\' 1"  (1.549 m)  Weight: 300 lb (136.079 kg)    Not recorded      Body mass index is 56.71 kg/(m^2).   Generalized: In no acute distress  Neck: Supple, no carotid bruits   Cardiac: Regular rate rhythm  Pulmonary: Clear to auscultation bilaterally  Musculoskeletal: No deformity  Neurological examination  Mentation: obese, cooperative  Cranial nerve II-XII: Pupils were equal round reactive to light. Extraocular movements were full.  Visual field were full on confrontational  test. Bilateral fundi were sharp.  Facial sensation and strength were normal. Hearing was intact to finger rubbing bilaterally. Uvula tongue midline.  Head turning and shoulder shrug and were normal and symmetric.Tongue protrusion into cheek strength was normal.  Motor: mild ankle dorsiflexion 4/5-, toe extension weakness 4/5-, toe flexion 4/5-, eversion 4/5, inversion 4/5  Sensory: Length dependent fine touch, pinprick to mid shin level, preserved toe vibratory sensation, and proprioception at toes.  Coordination: Normal finger to nose, heel-to-shin bilaterally there was no truncal ataxia  Gait: She has mild right foot drop, could not stand up on tiptoe, or heels. She has big body habitus, unsteady gait  Romberg signs: Negative  Deep tendon reflexes: Brachioradialis 2/2, biceps 2/2, triceps 2/2, patellar 1/2, Achilles absent, plantar responses were flexor bilaterally.   DIAGNOSTIC DATA (LABS, IMAGING, TESTING) - I reviewed patient records, labs, notes, testing and imaging  myself where available.  Lab Results  Component Value Date   WBC 13.9* 10/10/2013   HGB 15.3* 11/10/2013   HCT 45.0 11/10/2013   MCV 91.1 10/10/2013   PLT 316 10/10/2013      Component Value Date/Time   NA 139 11/10/2013 1607   K 4.1 11/10/2013 1607   CL 100 11/10/2013 1607   CO2 26 10/16/2013 1350   GLUCOSE 151* 11/10/2013 1607   BUN 14 11/10/2013 1607   CREATININE 0.80 11/10/2013 1607   CALCIUM 9.9 10/16/2013 1350   PROT 6.8 10/10/2013 0550   ALBUMIN 2.9* 10/10/2013 0550   AST 54* 10/10/2013 0550   ALT 53* 10/10/2013 0550   ALKPHOS 57 10/10/2013 0550   BILITOT 0.5 10/10/2013 0550   GFRNONAA >90 10/16/2013 1350   GFRAA >90 10/16/2013 1350    Lab Results  Component Value Date   HGBA1C 7.6* 10/05/2013   No results found for: [id number] Lab Results  Component Value Date   TSH 5.154* 10/03/2013     ASSESSMENT AND PLAN  Cynara A Ask is a 34 y.o. female complains with past medical history of diabetes, obesity, recent surgery for left femur fracture, now presenting with bilateral lower extremity paresthesia and weakness, right more than left leg, gait difficulty,  bilateral upper and lower extremity paresthesia,  Today's electrodiagnostic study showed evidence of bilateral lumbar plexopathy, most suggestive of diabetic amyotrophy, right L4, 5, more than S1, Left S1 more that L5. Overall has improved, less pain  1. continue nortriptyline 25 mg  2 tablets every night, in combination with current tramadol 3 times a day, Lyrica 100 mg 3 times a day 2. Return to clinic in 6 months 3. Continue to moderate exercise.    Levert Feinstein, M.D. Ph.D.  [facility] [address] [place], [place] [address] [phone] and

## 2015-03-26 ENCOUNTER — Ambulatory Visit: Payer: Medicaid Other | Admitting: Neurology

## 2015-04-10 ENCOUNTER — Encounter: Payer: Self-pay | Admitting: Neurology

## 2015-04-10 ENCOUNTER — Ambulatory Visit (INDEPENDENT_AMBULATORY_CARE_PROVIDER_SITE_OTHER): Payer: Medicaid Other | Admitting: Neurology

## 2015-04-10 VITALS — BP 133/71 | HR 105 | Ht 62.0 in | Wt 310.0 lb

## 2015-04-10 DIAGNOSIS — G541 Lumbosacral plexus disorders: Secondary | ICD-10-CM

## 2015-04-10 DIAGNOSIS — R269 Unspecified abnormalities of gait and mobility: Secondary | ICD-10-CM

## 2015-04-10 DIAGNOSIS — E1144 Type 2 diabetes mellitus with diabetic amyotrophy: Secondary | ICD-10-CM

## 2015-04-10 MED ORDER — TRAMADOL HCL 50 MG PO TABS: 50.0000 mg | ORAL_TABLET | Freq: Four times a day (QID) | ORAL | Status: DC | PRN

## 2015-04-10 MED ORDER — PREGABALIN 100 MG PO CAPS: 100.0000 mg | ORAL_CAPSULE | Freq: Three times a day (TID) | ORAL | Status: DC

## 2015-04-10 NOTE — Progress Notes (Signed)
Chief Complaint  Patient presents with  . Diabetic Neuropathy    Feels her neuropathy is well controlled with her current medications.  . Abnormal Gait    Her gait is stable.  She is still able to ambulate without assistance.  . Excessive Daytime Somnolence    Her mother, Babette Relicammy, is concerned about the numerous naps she takes during the day.  She goes to bed around midnight and gets up around 7:30am.      PATIENT: Emunah A Scialdone DOB: 10-13-1981  HISTORICAL  Jacie A Latour is a 34 years old right-handed Caucasian female, accompanied by her parents, referred by podiatrist Dr. Ferman HammingBenjamin McKinney, and primary care physician Dr. Phillips OdorGolding for evaluation of bilateral lower extremity pain  She had past medical history of diabetes, obesity, mental retardation, she was a full-term, but with mental delay, was able to special education, had a high school graduate, at baseline, she ambulate without assistance, able to help with simple house chores.  She fell, broke her left femur in December 2014, x-ray showed comminuted predominantly oblique fracture through the mid and distal  left femoral shaft. She underwent retrograde femoral nail with interlocks. By Dr. Ronnald NianMarquis in October 03 2013 under general anesthesia, plus local anesthesia  She was discharged to home in October 20 2013, able to ambulate with walker, but denies significant left femur, or left hip pain,  Around January 19th 2015, she began to complains of bilateral feet, lower extremity paresthesia, burning pain, from knee down, worsening gait difficulty, could not pick up her right foot off the floor,  Bilateral lower extremity arterial Doppler study in a poor 6 2016 showed no evidence of hemodynamic vessel occlusive disease at bilateral lower extremity, elevated ABIs bilaterally likely: secondary to distal small vessel calcification, most commonly attributed to her previous history of diabetes  Bilateral lower extremity venous Doppler  ultrasound showed no evidence of deep venous thrombosis in either lower extremity  Most recent laboratory evaluation in January 2015, showed normal CBC, BMP, with exception of elevated glucose 139  She is now taking tramadol 50 mg every 6 hours, Lyrica 50 mg 3 times a day, helps her some  She also complains of bilateral fingertips paresthesia, but denies significant upper extremity weakness   EMG/NCS showed active denervation at bilateral lower extremities, involving bilateral L4,5,S1 myotomes, more of right L4-5, left S1, but no active denervation at bilateral lumbar paraspinal muscles, with absent bilateral peroneal sensory responses, above findings support diabetic amyotrophy.   We also reviewed MRI lumbar,  MRI lumbar spine (without) demonstrating:  1. At L5-S1: far left lateral disc protrusion with potential impingement upon the exiting left L5 root; mild-moderate left foraminal stenosis.  2. At L4-5: posterior central disc protrusion (4mm); no spinal stenosis or foraminal narrowing.  MRI cervical: Disc bulging at C4-5 and C7-T1.  No spinal stenosis or foraminal narrowing. No intrinsic or compressive spinal cord lesions. MRI thoracic showed no significant stenosis She denies bowel and bladder incontinence.  UPDATE Oct 8th 2015: She came in with her parents today, continues to complains of low back, leg and feet pain, reported 7 out of 10 on average, sometimes up to 10 out of 10, 25% of time to the point of tearing, has difficulty sleeping tonight because of the pain,  She has difficulty walking because of the pain, overall, her walking has improved, she no longer require a walker, has stronger right ankle, using a cane now,  She she denies no bowel bladder incontinence,  She  is taking Lyrica 100 mg 3 times a day, tramadol 50 mg every 6 hours as needed  UPDATE Oct 22 2014: She sleeps better, wth nortriptuline  2 tab qhs, she still complains of back pain,  Left lateral leg  pain.  She is not active, sitting down most of times   UPDATE June 8th 2016: She is overall doing well, able to ambulate with out assistant, she stays home alone, her pain is under good control now, she is taking tramadol 50 mg 3 times a day, Lyrica 100 mg 3 times a day, nortriptyline 25 mg 2 tablets every night, she tends to stay up late, she takes multiple naps during the day. She complains of excessive daytime fatigue, and sleepiness,  REVIEW OF SYSTEMS: Full 14 system review of systems performed and notable only for sleepiness, excessive sweating  ALLERGIES: Allergies  Allergen Reactions  . Ceclor [Cefaclor]     Rash  . Invokana [Canagliflozin]     Severe yeast infections  . Metformin And Related     GI distress  . Onglyza [Saxagliptin]     headaches  . Penicillins Rash    HOME MEDICATIONS: Current Outpatient Prescriptions on File Prior to Visit  Medication Sig Dispense Refill  . diphenhydrAMINE-zinc acetate (BENADRYL) cream Apply topically as needed for itching. 28.4 g 0  . fluticasone (FLONASE) 50 MCG/ACT nasal spray Place 2 sprays into the nose daily.    . furosemide (LASIX) 20 MG tablet Take 20 mg by mouth as needed.    Marland Kitchen glimepiride (AMARYL) 4 MG tablet Take 4 mg by mouth 2 (two) times daily.    Marland Kitchen loratadine (CLARITIN) 10 MG tablet Take 10 mg by mouth daily.    . nortriptyline (PAMELOR) 25 MG capsule One po qhs xone week, then 2 tabs po qhs 60 capsule 11  . omeprazole (PRILOSEC) 20 MG capsule Take 20 mg by mouth daily.    . pregabalin (LYRICA) 100 MG capsule Take 1 capsule (100 mg total) by mouth 3 (three) times daily. 90 capsule 5  . traMADol (ULTRAM) 50 MG tablet Take 1 tablet (50 mg total) by mouth every 6 (six) hours as needed. 120 tablet 5   No current facility-administered medications on file prior to visit.    PAST MEDICAL HISTORY: Past Medical History  Diagnosis Date  . Diabetes mellitus without complication     Dr. Lurene Shadow  . Thyroid disease   . GERD  (gastroesophageal reflux disease)     PAST SURGICAL HISTORY: Past Surgical History  Procedure Laterality Date  . Tubes in ears    . Back surgery    . Femur im nail Left 10/03/2013    Procedure: INTRAMEDULLARY (IM) RETROGRADE FEMORAL NAILING;  Surgeon: Eldred Manges, MD;  Location: [facility] OR;  Service: Orthopedics;  Laterality: Left;    FAMILY HISTORY: Family History  Problem Relation Age of Onset  . Hypertension Mother   . Thyroid disease Mother     SOCIAL HISTORY:  History   Social History  . Marital Status: Single    Spouse Name: N/A    Number of Children: N/A  . Years of Education: 12   Occupational History  . Not on file.   Social History Main Topics  . Smoking status: Never Smoker   . Smokeless tobacco: Never Used  . Alcohol Use: No  . Drug Use: No  . Sexual Activity: Not on file   Other Topics Concern  . Not on file   Social History Narrative  Patient lives at home with her parents [place] and [place].   Patient has a high school certificate.   Right handed.   Caffeine coke cola sometimes.     PHYSICAL EXAM   Filed Vitals:   04/10/15 1044  BP: 133/71  Pulse: 105  Height:  (1.575 m)  Weight: 310 lb (140.615 kg)    Not recorded      Body mass index is 56.69 kg/(m^2). PHYSICAL EXAMNIATION:  Gen: NAD, conversant, well nourised, obese, well groomed                     Cardiovascular: Regular rate rhythm, no peripheral edema, warm, nontender. Eyes: Conjunctivae clear without exudates or hemorrhage Neck: Supple, no carotid bruise. Pulmonary: Clear to auscultation bilaterally   NEUROLOGICAL EXAM:  MENTAL STATUS: Speech:    Speech is normal; fluent and spontaneous with normal comprehension.  Cognition:    The patient is oriented to person, place, and time;     recent and remote memory intact;     language fluent;     normal attention, concentration,     fund of knowledge.  CRANIAL NERVES: CN II: Visual fields are full to confrontation.  Pupil equal round reactive to light CN III, IV, VI: extraocular movement are normal. No ptosis. CN V: Facial sensation is intact to pinprick in all 3 divisions bilaterally. Corneal responses are intact.  CN VII: Face is symmetric with normal eye closure and smile. CN VIII: Hearing is normal to rubbing fingers CN IX, X: Palate elevates symmetrically. Phonation is normal. CN XI: Head turning and shoulder shrug are intact CN XII: Tongue is midline with normal movements and no atrophy.  MOTOR: There is no pronator drift of out-stretched arms. Muscle bulk and tone are normal. She has mild bilateral ankle dorsiflexion weakness, right worse than left  REFLEXES: Reflexes are 2+ and symmetric at the biceps, triceps, absent at knees, and ankles. Plantar responses are flexor.  SENSORY: Length dependent decreased light touch, pinprick, position sense at toes and distal legs.  COORDINATION: Rapid alternating movements and fine finger movements are intact. There is no dysmetria on finger-to-nose and heel-knee-shin. There are no abnormal or extraneous movements.   GAIT/STANCE: Need to push up to get up from seated position, mild right foot drop, mild unsteady also due to her big body habitus, could not walk on tiptoe, heels, and tandem walking,      DIAGNOSTIC DATA (LABS, IMAGING, TESTING) - I reviewed patient records, labs, notes, testing and imaging myself where available.  Lab Results  Component Value Date   WBC 13.9* 10/10/2013   HGB 15.3* 11/10/2013   HCT 45.0 11/10/2013   MCV 91.1 10/10/2013   PLT 316 10/10/2013      Component Value Date/Time   NA 139 11/10/2013 1607   K 4.1 11/10/2013 1607   CL 100 11/10/2013 1607   CO2 26 10/16/2013 1350   GLUCOSE 151* 11/10/2013 1607   BUN 14 11/10/2013 1607   CREATININE 0.80 11/10/2013 1607   CALCIUM 9.9 10/16/2013 1350   PROT 6.8 10/10/2013 0550   ALBUMIN 2.9* 10/10/2013 0550   AST 54* 10/10/2013 0550   ALT 53* 10/10/2013 0550   ALKPHOS  57 10/10/2013 0550   BILITOT 0.5 10/10/2013 0550   GFRNONAA >90 10/16/2013 1350   GFRAA >90 10/16/2013 1350    Lab Results  Component Value Date   HGBA1C 7.6* 10/05/2013   No results found for: [id number] Lab Results  Component Value Date  TSH 5.154* 10/03/2013     ASSESSMENT AND PLAN  Traeh A Schreffler is a 34 y.o. female complains with past medical history of diabetes, obesity, following her surgery of left femoral fracture in December 2014,, presented with bilateral lower extremity paresthesia and weakness, right more than left leg, gait difficulty,  Electrodiagnostic study showed evidence of bilateral lumbar plexopathy, most consistent with diabetic amyotrophy, involving right L4, 5, more than S1, Left S1 more that L5.   1. continue nortriptyline 25 mg  2 tablets every night, in combination with current tramadol 3 times a day, Lyrica 100 mg 3 times a day 2. Continue to moderate exercise. Weight loss program 3. She complains of excessive daytime sleepiness, this could due to obstructive sleep apnea, sedentary lifestyle, medicine side effect, weight loss, may consider sleep study if she continue to be symptomatic  Levert Feinstein, M.D. Ph.D.  [facility] [address] [place], [place] [address] [phone] and

## 2015-05-07 ENCOUNTER — Telehealth: Payer: Self-pay | Admitting: Neurology

## 2015-05-07 NOTE — Telephone Encounter (Signed)
Patient's mother Babette Relicammy is calling in regard to Rx Lyrica 100 mg.  She states the pharmacy [facility] [place] is telling her that they need authorization from the doctor and stated that they faxed on Friday.  Please call.

## 2015-05-07 NOTE — Telephone Encounter (Addendum)
I called [facility].  They state the patient is not getting her Rx there.  I called alternate pharm on file, [facility].  They indicate they do have the Rx for Lyrica, however, it is not covered under Medicaid.  I contacted Medicaid to provide clincial info and ask that they grant an exception and cover this drug.  Request is currently under review.  I called back to advise.  They are aware.  (They required a documented trail and failure of a minimum of two alternatives including, but not limited to Gabapentin, Amitriptyline, Nortriptyline, Topamax, Lamictal, Zonegran, Diastat, Clonazepam and Keppra.  Mother did verify the patient has tried four of these medications previously, some from a previous provider-Gabapentin, Tpx, without benefit.)

## 2015-05-09 NOTE — Telephone Encounter (Signed)
Medicaid has approved the request for coverage on Lyrica effective until 05/06/2016 Ref # [id number].  I called [name] to advise.  She is aware.

## 2015-08-05 ENCOUNTER — Encounter: Payer: Self-pay | Admitting: *Deleted

## 2015-08-05 ENCOUNTER — Telehealth: Payer: Self-pay | Admitting: Neurology

## 2015-08-05 DIAGNOSIS — E1142 Type 2 diabetes mellitus with diabetic polyneuropathy: Secondary | ICD-10-CM

## 2015-08-05 MED ORDER — GABAPENTIN 100 MG PO CAPS: ORAL_CAPSULE | ORAL | Status: DC

## 2015-08-05 NOTE — Telephone Encounter (Signed)
Spoke to patient's mother, Babette Relic (on HIPPA), who states patient has had a spot in her mouth that has been itching since shortly after starting Lyrica in July.  She is now having itching on her head and splotches on her face.  Patient's uncle, who is a podiatrist, instructed her to stop the Lyrica on 08/03/15 and restart gabapentin at the previous dose of  TID.  Her mother said this dose has historically not been helpful for her but she didn't want her to not take anything, so this is the dose she is being given.  She also stated the patient was only taking nortriptyline  at qhs rather than .  Her mother said initially she chose to reduce it because she thought it to be the culprit of the itchy spot in her mouth and just never increased it back to the ordered dose.  Patient's mother said she would go ahead and place her back on nortriptyline  at qhs.  Says her itching seem better since discontinuing the Lyrica.  She wants to know if you feel a higher dose of gabapentin would be more helpful.

## 2015-08-05 NOTE — Telephone Encounter (Signed)
Chart reviewed, last office visit was in June 2016, but Lyrica was started since June 2015, I doubt that itching spot is due to Lyrica, since she has been on the medication for so long,  But if she wants to switch to gabapentin that is okay, I can start gabapentin 300 mg 3 times a day,

## 2015-08-05 NOTE — Telephone Encounter (Signed)
Alexandra Kelley aware that, per Dr. Terrace Arabia, to continue nortriptyline  each night.  Discussed titrating her gabapentin dose up slowly so that Alexandra Kelley is able to tolerate it with fewer side effects.  She will call me back with any further questions.

## 2015-08-05 NOTE — Telephone Encounter (Signed)
Pt's mother called and states that she thinks her daughter is having a reaction to pregabalin (LYRICA) 100 MG capsule. Pt told mother that her head was itching badly. Pt's mother says her face had splotches and was itching her. Also says pt has a place in her mouth that bothers the pt. Mother did take her to the dentist for it but they are unsure. Mother states pt is not showing any signs or symptoms of shortness of breath. Please call and advise [phone]

## 2015-10-10 ENCOUNTER — Ambulatory Visit: Payer: Medicaid Other | Admitting: Adult Health

## 2015-10-15 ENCOUNTER — Encounter: Payer: Self-pay | Admitting: Adult Health

## 2015-10-15 ENCOUNTER — Ambulatory Visit (INDEPENDENT_AMBULATORY_CARE_PROVIDER_SITE_OTHER): Payer: Medicaid Other | Admitting: Adult Health

## 2015-10-15 VITALS — BP 109/62 | HR 100 | Ht 62.0 in | Wt 315.5 lb

## 2015-10-15 DIAGNOSIS — R269 Unspecified abnormalities of gait and mobility: Secondary | ICD-10-CM

## 2015-10-15 DIAGNOSIS — E1144 Type 2 diabetes mellitus with diabetic amyotrophy: Secondary | ICD-10-CM | POA: Diagnosis not present

## 2015-10-15 DIAGNOSIS — G541 Lumbosacral plexus disorders: Secondary | ICD-10-CM

## 2015-10-15 NOTE — Progress Notes (Signed)
PATIENT: Alexandra Kelley DOB: 1981-01-04  REASON FOR VISIT: follow up- diabetic lumbosacral plexopathy, abnormality of gaitn0  HISTORY FROM: patient  HISTORY OF PRESENT ILLNESS: Alexandra Kelley is a 34 year old female with a history of diabetic lumbosacral plexopathy and abnormality of gait. She returns today for follow-up. She is currently taking tramadol 50 mg 3 times a day as well as gabapentin 300 mg 3 times a day and nortriptyline 50 mg at bedtime. She reports that this is working well for her discomfort. Her mother states that she is very sleepy during the day. She is unsure if this is from her medication.  The mom states that she does not snore. The patient denies getting up anything at night. She does state that when she was in the hospital they felt that she had sleep apnea as they requested that she uses the CPAP machine but the patient refused. Patient also reports that she does not do much exercising. She is also not necessarily watching her diet.The patient is no longer using any assistive device when ambulating. She returns today for an evaluation.  HISTORY (YAN): Alexandra Kelley is a 34 years old right-handed Caucasian female, accompanied by her parents, referred by podiatrist Dr. Benjamin McKinney, and primary care physician Dr. Golding for evaluation of bilateral lower extremity pain  She had past medical history of diabetes, obesity, mental retardation, she was a full-term, but with mental delay, was able to special education, had a high school graduate, at baseline, she ambulate without assistance, able to help with simple house chores.  She fell, broke her left femur in December 2014, x-ray showed comminuted predominantly oblique fracture through the mid and distal left femoral shaft. She underwent retrograde femoral nail with interlocks. By Dr. Marquis in October 03 2013 under general anesthesia, plus local anesthesia  She was discharged to home in October 20 2013, able  to ambulate with walker, but denies significant left femur, or left hip pain,  Around January 19th 2015, she began to complains of bilateral feet, lower extremity paresthesia, burning pain, from knee down, worsening gait difficulty, could not pick up her right foot off the floor,  Bilateral lower extremity arterial Doppler study in a poor 6 2016 showed no evidence of hemodynamic vessel occlusive disease at bilateral lower extremity, elevated ABIs bilaterally likely: secondary to distal small vessel calcification, most commonly attributed to her previous history of diabetes  Bilateral lower extremity venous Doppler ultrasound showed no evidence of deep venous thrombosis in either lower extremity  Most recent laboratory evaluation in January 2015, showed normal CBC, BMP, with exception of elevated glucose 139  She is now taking tramadol 50 mg every 6 hours, Lyrica 50 mg 3 times a day, helps her some  She also complains of bilateral fingertips paresthesia, but denies significant upper extremity weakness   EMG/NCS showed active denervation at bilateral lower extremities, involving bilateral L4,5,S1 myotomes, more of right L4-5, left S1, but no active denervation at bilateral lumbar paraspinal muscles, with absent bilateral peroneal sensory responses, above findings support diabetic amyotrophy.   We also reviewed MRI lumbar,  MRI lumbar spine (without) demonstrating:  1. At L5-S1: far left lateral disc protrusion with potential impingement upon the exiting left L5 root; mild-moderate left foraminal stenosis.  2. At L4-5: posterior central disc [phone] Chadsis or foraminal narrowing.  MRI cervical: Disc bulging at C4-5 and C7-T1. No spinal stenosis or foraminal narrowing. No intrinsic or compressive spinal cord lesions. MRI thoracic showed no  significant stenosis She denies bowel and bladder incontinence.  UPDATE Oct 8th 2015: She came in with her parents today,  continues to complains of low back, leg and feet pain, reported 7 out of 10 on average, sometimes up to 10 out of 10, 25% of time to the point of tearing, has difficulty sleeping tonight because of the pain,  She has difficulty walking because of the pain, overall, her walking has improved, she no longer require a walker, has stronger right ankle, using a cane now,  She she denies no bowel bladder incontinence,  She is taking Lyrica 100 mg 3 times a day, tramadol 50 mg every 6 hours as needed  UPDATE Oct 22 2014: She sleeps better, wth nortriptuline  2 tab qhs, she still complains of back pain, Left lateral leg pain.  She is not active, sitting down most of times   UPDATE June 8th 2016: She is overall doing well, able to ambulate with out assistant, she stays home alone, her pain is under good control now, she is taking tramadol 50 mg 3 times a day, Lyrica 100 mg 3 times a day, nortriptyline 25 mg 2 tablets every night, she tends to stay up late, she takes multiple naps during the day. She complains of excessive daytime fatigue, and sleepiness,   REVIEW OF SYSTEMS: Out of a complete 14 system review of symptoms, the patient complains only of the following symptoms, and all other reviewed systems are negative.  Back pain, muscle cramps, wounds, itching, numbness, excessive sweating  ALLERGIES: Allergies  Allergen Reactions  . Ceclor [Cefaclor]     Rash  . Invokana [Canagliflozin]     Severe yeast infections  . Metformin And Related     GI distress  . Onglyza [Saxagliptin]     headaches  . Penicillins Rash    HOME MEDICATIONS: Outpatient Prescriptions Prior to Visit  Medication Sig Dispense Refill  . diphenhydrAMINE-zinc acetate (BENADRYL) cream Apply topically as needed for itching. 28.4 g 0  . fluticasone (FLONASE) 50 MCG/ACT nasal spray Place 2 sprays into the nose daily.    . furosemide (LASIX) 20 MG tablet Take 20 mg by mouth as needed.    . gabapentin (NEURONTIN) 100  MG capsule Take 3 capsules three times daily. (Patient taking differently: Take 100 mg by mouth 3 (three) times daily. Take 3 capsules three times daily.) 270 capsule 0  . glimepiride (AMARYL) 4 MG tablet Take 4 mg by mouth 2 (two) times daily.    Marland Kitchen levothyroxine (SYNTHROID, LEVOTHROID) 150 MCG tablet Take 150 mcg by mouth daily.  3  . loratadine (CLARITIN) 10 MG tablet Take 10 mg by mouth daily as needed.     . nortriptyline (PAMELOR) 25 MG capsule One po qhs xone week, then 2 tabs po qhs 60 capsule 11  . omeprazole (PRILOSEC) 20 MG capsule Take 20 mg by mouth daily as needed.     . traMADol (ULTRAM) 50 MG tablet Take 1 tablet (50 mg total) by mouth every 6 (six) hours as needed. 120 tablet 5   No facility-administered medications prior to visit.    PAST MEDICAL HISTORY: Past Medical History  Diagnosis Date  . Diabetes mellitus without complication (HCC)     Dr. Lurene Shadow  . Thyroid disease   . GERD (gastroesophageal reflux disease)     PAST SURGICAL HISTORY: Past Surgical History  Procedure Laterality Date  . Tubes in ears    . Back surgery    . Femur im nail  Left 10/03/2013    Procedure: INTRAMEDULLARY (IM) RETROGRADE FEMORAL NAILING;  Surgeon: Eldred Manges, MD;  Location: [facility] OR;  Service: Orthopedics;  Laterality: Left;    FAMILY HISTORY: Family History  Problem Relation Age of Onset  . Hypertension Mother   . Thyroid disease Mother     SOCIAL HISTORY: Social History   Social History  . Marital Status: Single    Spouse Name: N/A  . Number of Children: N/A  . Years of Education: 12   Occupational History  . Not on file.   Social History Main Topics  . Smoking status: Never Smoker   . Smokeless tobacco: Never Used  . Alcohol Use: No  . Drug Use: No  . Sexual Activity: Not on file   Other Topics Concern  . Not on file   Social History Narrative   Patient lives at home with her parents [place] and [place].   Patient has a high school certificate.   Right handed.     Caffeine coke cola sometimes.               PHYSICAL EXAM  Filed Vitals:   10/15/15 1003  BP: 109/62  Pulse: 100  Height: 5\' 2"  (1.575 m)  Weight: 315 lb 8 oz (143.11 kg)   Body mass index is 57.69 kg/(m^2).  Generalized: Well developed, in no acute distress   Neurological examination  Mentation: Alert oriented to time, place, history taking. Follows all commands speech and language fluent Cranial nerve II-XII: Pupils were equal round reactive to light. Extraocular movements were full, visual field were full on confrontational test. Facial sensation and strength were normal. Uvula tongue midline. Head turning and shoulder shrug  were normal and symmetric. Motor: The motor testing reveals 5 over 5 strength of all 4 extremities. Good symmetric motor tone is noted throughout.  Sensory: Sensory testing is intact to soft touch on all 4 extremities. No evidence of extinction is noted.  Coordination: Cerebellar testing reveals good finger-nose-finger and heel-to-shin bilaterally.  Gait and station: Gait is normal. Tandem gait is normal. Romberg is negative. No drift is seen.  Reflexes: Deep tendon reflexes are symmetric and normal bilaterally.   DIAGNOSTIC DATA (LABS, IMAGING, TESTING) - I reviewed patient records, labs, notes, testing and imaging myself where available.  Lab Results  Component Value Date   WBC 13.9* 10/10/2013   HGB 15.3* 11/10/2013   HCT 45.0 11/10/2013   MCV 91.1 10/10/2013   PLT 316 10/10/2013      Component Value Date/Time   NA 139 11/10/2013 1607   K 4.1 11/10/2013 1607   CL 100 11/10/2013 1607   CO2 26 10/16/2013 1350   GLUCOSE 151* 11/10/2013 1607   BUN 14 11/10/2013 1607   CREATININE 0.80 11/10/2013 1607   CALCIUM 9.9 10/16/2013 1350   PROT 6.8 10/10/2013 0550   ALBUMIN 2.9* 10/10/2013 0550   AST 54* 10/10/2013 0550   ALT 53* 10/10/2013 0550   ALKPHOS 57 10/10/2013 0550   BILITOT 0.5 10/10/2013 0550   GFRNONAA >90 10/16/2013 1350   GFRAA  >90 10/16/2013 1350    ASSESSMENT AND PLAN 34 y.o. year old female  has a past medical history of Diabetes mellitus without complication (HCC); Thyroid disease; and GERD (gastroesophageal reflux disease). here with:  1. Diabetic lumbosacral plexopathy 2. Abnormality of gait  Overall the patient is well. She will continue tramadol, gabapentin and nortriptyline. She does not need refills today. I discussed at length how she should monitor her diet.  Also explained to the patient the importance of light to moderate exercise. She verbalized understanding. Also provided her with a handout to help her with her diet. Patient advised that if her symptoms worsen or she develops any new symptoms she should let us know. She will follow-up in 6 months or sooner if needed.     Butch Penny, MSN, NP-C 10/15/2015, 10:13 AM [facility] [address], [address] [place], [place] [address] [phone]

## 2015-10-15 NOTE — Progress Notes (Signed)
I have reviewed and agreed above plan. 

## 2015-10-15 NOTE — Patient Instructions (Signed)
Continue tramadol, gabapentin and nortriptyline Monitor diet and begin with light exercise If your symptoms worsen or you develop new symptoms please let us know.   Diabetes Mellitus and Food It is important for you to manage your blood sugar (glucose) level. Your blood glucose level can be greatly affected by what you eat. Eating healthier foods in the appropriate amounts throughout the day at about the same time each day will help you control your blood glucose level. It can also help slow or prevent worsening of your diabetes mellitus. Healthy eating may even help you improve the level of your blood pressure and reach or maintain a healthy weight.  General recommendations for healthful eating and cooking habits include:  Eating meals and snacks regularly. Avoid going long periods of time without eating to lose weight.  Eating a diet that consists mainly of plant-based foods, such as fruits, vegetables, nuts, legumes, and whole grains.  Using low-heat cooking methods, such as baking, instead of high-heat cooking methods, such as deep frying. Work with your dietitian to make sure you understand how to use the Nutrition Facts information on food labels. HOW CAN FOOD AFFECT ME? Carbohydrates Carbohydrates affect your blood glucose level more than any other type of food. Your dietitian will help you determine how many carbohydrates to eat at each meal and teach you how to count carbohydrates. Counting carbohydrates is important to keep your blood glucose at a healthy level, especially if you are using insulin or taking certain medicines for diabetes mellitus. Alcohol Alcohol can cause sudden decreases in blood glucose (hypoglycemia), especially if you use insulin or take certain medicines for diabetes mellitus. Hypoglycemia can be a life-threatening condition. Symptoms of hypoglycemia (sleepiness, dizziness, and disorientation) are similar to symptoms of having too much alcohol.  If your health care  provider has given you approval to drink alcohol, do so in moderation and use the following guidelines:  Women should not have more than one drink per day, and men should not have more than two drinks per day. One drink is equal to:  12 oz of beer.  5 oz of wine.  1 oz of hard liquor.  Do not drink on an empty stomach.  Keep yourself hydrated. Have water, diet soda, or unsweetened iced tea.  Regular soda, juice, and other mixers might contain a lot of carbohydrates and should be counted. WHAT FOODS ARE NOT RECOMMENDED? As you make food choices, it is important to remember that all foods are not the same. Some foods have fewer nutrients per serving than other foods, even though they might have the same number of calories or carbohydrates. It is difficult to get your body what it needs when you eat foods with fewer nutrients. Examples of foods that you should avoid that are high in calories and carbohydrates but low in nutrients include:  Trans fats (most processed foods list trans fats on the Nutrition Facts label).  Regular soda.  Juice.  Candy.  Sweets, such as cake, pie, doughnuts, and cookies.  Fried foods. WHAT FOODS CAN I EAT? Eat nutrient-rich foods, which will nourish your body and keep you healthy. The food you should eat also will depend on several factors, including:  The calories you need.  The medicines you take.  Your weight.  Your blood glucose level.  Your blood pressure level.  Your cholesterol level. You should eat a variety of foods, including:  Protein.  Lean cuts of meat.  Proteins low in saturated fats, such as fish,  egg whites, and beans. Avoid processed meats.  Fruits and vegetables.  Fruits and vegetables that may help control blood glucose levels, such as apples, mangoes, and yams.  Dairy products.  Choose fat-free or low-fat dairy products, such as milk, yogurt, and cheese.  Grains, bread, pasta, and rice.  Choose whole grain  products, such as multigrain bread, whole oats, and brown rice. These foods may help control blood pressure.  Fats.  Foods containing healthful fats, such as nuts, avocado, olive oil, canola oil, and fish. DOES EVERYONE WITH DIABETES MELLITUS HAVE THE SAME MEAL PLAN? Because every person with diabetes mellitus is different, there is not one meal plan that works for everyone. It is very important that you meet with a dietitian who will help you create a meal plan that is just right for you.   This information is not intended to replace advice given to you by your health care provider. Make sure you discuss any questions you have with your health care provider.   Document Released: 07/16/2005 Document Revised: 11/09/2014 Document Reviewed: 09/15/2013 Elsevier Interactive Patient Education [employer]

## 2015-11-06 ENCOUNTER — Telehealth: Payer: Self-pay | Admitting: Neurology

## 2015-11-06 ENCOUNTER — Other Ambulatory Visit: Payer: Self-pay | Admitting: Neurology

## 2015-11-06 MED ORDER — NORTRIPTYLINE HCL 25 MG PO CAPS: 50.0000 mg | ORAL_CAPSULE | Freq: Every day | ORAL | Status: DC

## 2015-11-06 MED ORDER — TRAMADOL HCL 50 MG PO TABS: 50.0000 mg | ORAL_TABLET | Freq: Four times a day (QID) | ORAL | Status: DC | PRN

## 2015-11-06 NOTE — Telephone Encounter (Signed)
Request entered, forwarded to provider for approval.  

## 2015-11-06 NOTE — Telephone Encounter (Signed)
Pt's mother called inquiring about tramadol. She said she was not aware it had to be picked up. She can be reached at [phone]

## 2015-11-06 NOTE — Telephone Encounter (Signed)
Rx for Tramadol faxed and confirmed to pharmacy at [phone].

## 2015-11-06 NOTE — Telephone Encounter (Signed)
It appears Dr Terrace ArabiaYan approved this Rx today.  It will be faxed to the pharmacy one hard copy is signed.  I called back and spoke with Ms Babette Relicammy.  She expressed understanding.

## 2015-11-06 NOTE — Telephone Encounter (Signed)
Pt needs refill on nortriptyline (PAMELOR) 25 MG capsule and traMADol (ULTRAM) 50 MG tablet. The pt has two tramadol left. Her refill ran out on 10/22/15. Please send to [facility] in [place].Thank you

## 2015-11-07 ENCOUNTER — Other Ambulatory Visit: Payer: Self-pay

## 2016-04-14 ENCOUNTER — Ambulatory Visit: Payer: Medicaid Other | Admitting: Adult Health

## 2016-04-21 ENCOUNTER — Ambulatory Visit: Payer: Medicaid Other | Admitting: Adult Health

## 2016-05-07 ENCOUNTER — Encounter: Payer: Self-pay | Admitting: Adult Health

## 2016-05-07 ENCOUNTER — Ambulatory Visit (INDEPENDENT_AMBULATORY_CARE_PROVIDER_SITE_OTHER): Payer: Medicaid Other | Admitting: Adult Health

## 2016-05-07 VITALS — BP 120/76 | HR 90 | Resp 20 | Ht 62.0 in | Wt 312.0 lb

## 2016-05-07 DIAGNOSIS — R269 Unspecified abnormalities of gait and mobility: Secondary | ICD-10-CM

## 2016-05-07 DIAGNOSIS — G541 Lumbosacral plexus disorders: Secondary | ICD-10-CM

## 2016-05-07 DIAGNOSIS — E669 Obesity, unspecified: Secondary | ICD-10-CM

## 2016-05-07 DIAGNOSIS — E1144 Type 2 diabetes mellitus with diabetic amyotrophy: Secondary | ICD-10-CM | POA: Diagnosis not present

## 2016-05-07 NOTE — Patient Instructions (Signed)
Continue tramadol and gabapentin Ok to reduce Nortriptyline to 25 mg nightly- if discomfort worsens ok increase back to 50 mg.  If your symptoms worsen or you develop new symptoms please let us know.

## 2016-05-07 NOTE — Progress Notes (Signed)
PATIENT: Alexandra Kelley DOB: [birth year]  REASON FOR VISIT: follow up HISTORY FROM: patient  HISTORY OF PRESENT ILLNESS  HISTORY (YAN): Alexandra Kelley is a 35 years old right-handed Caucasian female, accompanied by her parents, referred by podiatrist Dr. Ferman Hamming, and primary care physician Dr. Phillips Odor for evaluation of bilateral lower extremity pain  She had past medical history of diabetes, obesity, mental retardation, she was a full-term, but with mental delay, was able to special education, had a high school graduate, at baseline, she ambulate without assistance, able to help with simple house chores.  She fell, broke her left femur in December 2014, x-ray showed comminuted predominantly oblique fracture through the mid and distal left femoral shaft. She underwent retrograde femoral nail with interlocks. By Dr. Ronnald Nian in October 03 2013 under general anesthesia, plus local anesthesia  She was discharged to home in October 20 2013, able to ambulate with walker, but denies significant left femur, or left hip pain,  Around January 19th 2015, she began to complains of bilateral feet, lower extremity paresthesia, burning pain, from knee down, worsening gait difficulty, could not pick up her right foot off the floor,  Bilateral lower extremity arterial Doppler study in a poor 6 2016 showed no evidence of hemodynamic vessel occlusive disease at bilateral lower extremity, elevated ABIs bilaterally likely: secondary to distal small vessel calcification, most commonly attributed to her previous history of diabetes  Bilateral lower extremity venous Doppler ultrasound showed no evidence of deep venous thrombosis in either lower extremity  Most recent laboratory evaluation in January 2015, showed normal CBC, BMP, with exception of elevated glucose 139  She is now taking tramadol 50 mg every 6 hours, Lyrica 50 mg 3 times a day, helps her some  She also complains of bilateral  fingertips paresthesia, but denies significant upper extremity weakness   EMG/NCS showed active denervation at bilateral lower extremities, involving bilateral L4,5,S1 myotomes, more of right L4-5, left S1, but no active denervation at bilateral lumbar paraspinal muscles, with absent bilateral peroneal sensory responses, above findings support diabetic amyotrophy.   We also reviewed MRI lumbar,  MRI lumbar spine (without) demonstrating:  1. At L5-S1: far left lateral disc protrusion with potential impingement upon the exiting left L5 root; mild-moderate left foraminal stenosis.  2. At L4-5: posterior central disc protrusion (4mm); no spinal stenosis or foraminal narrowing.  MRI cervical: Disc bulging at C4-5 and C7-T1. No spinal stenosis or foraminal narrowing. No intrinsic or compressive spinal cord lesions. MRI thoracic showed no significant stenosis She denies bowel and bladder incontinence.  UPDATE Oct 8th 2015: She came in with her parents today, continues to complains of low back, leg and feet pain, reported 7 out of 10 on average, sometimes up to 10 out of 10, 25% of time to the point of tearing, has difficulty sleeping tonight because of the pain,  She has difficulty walking because of the pain, overall, her walking has improved, she no longer require a walker, has stronger right ankle, using a cane now,  She she denies no bowel bladder incontinence,  She is taking Lyrica 100 mg 3 times a day, tramadol 50 mg every 6 hours as needed  UPDATE Oct 22 2014: She sleeps better, wth nortriptuline  2 tab qhs, she still complains of back pain, Left lateral leg pain.  She is not active, sitting down most of times   UPDATE June 8th 2016: She is overall doing well, able to ambulate with out assistant, she  stays home alone, her pain is under good control now, she is taking tramadol 50 mg 3 times a day, Lyrica 100 mg 3 times a day, nortriptyline 25 mg 2 tablets every night, she tends  to stay up late, she takes multiple naps during the day. She complains of excessive daytime fatigue, and sleepiness,  Update 05/07/2016:  Alexandra Kelley is a 35 year old female with a history of diabetic lumbosacral plexopathy an abnormality of gait. She returns today for follow-up. She continues on tramadol 50 mg 3 times a day, gabapentin 300 mg 3 times a day and nortriptyline 50 mg at bedtime. She reports that this continues to provide her with good relief. Her mom is questioning if perhaps nortriptyline can be reduced to 25 mg. The patient states that she is trying to monitor her diet and trying to participate and light exercise. She states that she has lost 3 pounds since the last visit. She returns today for an evaluation.  REVIEW OF SYSTEMS: Out of a complete 14 system review of symptoms, the patient complains only of the following symptoms, and all other reviewed systems are negative.  See history of present illness  ALLERGIES: Allergies  Allergen Reactions  . Ceclor [Cefaclor]     Rash  . Invokana [Canagliflozin]     Severe yeast infections  . Metformin And Related     GI distress  . Onglyza [Saxagliptin]     headaches  . Penicillins Rash    HOME MEDICATIONS: Outpatient Prescriptions Prior to Visit  Medication Sig Dispense Refill  . diphenhydrAMINE-zinc acetate (BENADRYL) cream Apply topically as needed for itching. 28.4 g 0  . fluticasone (FLONASE) 50 MCG/ACT nasal spray Place 2 sprays into the nose daily.    Marland Kitchen. glimepiride (AMARYL) 4 MG tablet Take 4 mg by mouth 2 (two) times daily.    Marland Kitchen. levothyroxine (SYNTHROID, LEVOTHROID) 150 MCG tablet Take 150 mcg by mouth daily.  3  . loratadine (CLARITIN) 10 MG tablet Take 10 mg by mouth daily as needed.     . nortriptyline (PAMELOR) 25 MG capsule Take 2 capsules (50 mg total) by mouth at bedtime. 60 capsule 5  . omeprazole (PRILOSEC) 20 MG capsule Take 20 mg by mouth daily as needed.     . traMADol (ULTRAM) 50 MG tablet Take 1 tablet  (50 mg total) by mouth every 6 (six) hours as needed. 120 tablet 5  . furosemide (LASIX) 20 MG tablet Take 20 mg by mouth as needed.    . gabapentin (NEURONTIN) 100 MG capsule Take 3 capsules three times daily. (Patient taking differently: Take 100 mg by mouth 3 (three) times daily. Take 3 capsules three times daily.) 270 capsule 0   No facility-administered medications prior to visit.    PAST MEDICAL HISTORY: Past Medical History  Diagnosis Date  . Diabetes mellitus without complication (HCC)     Dr. Lurene ShadowBallen  . Thyroid disease   . GERD (gastroesophageal reflux disease)     PAST SURGICAL HISTORY: Past Surgical History  Procedure Laterality Date  . Tubes in ears    . Back surgery    . Femur im nail Left 10/03/2013    Procedure: INTRAMEDULLARY (IM) RETROGRADE FEMORAL NAILING;  Surgeon: Eldred MangesMark C Yates, MD;  Location: [facility] OR;  Service: Orthopedics;  Laterality: Left;    FAMILY HISTORY: Family History  Problem Relation Age of Onset  . Hypertension Mother   . Thyroid disease Mother     SOCIAL HISTORY: Social History   Social History  .  Marital Status: Single    Spouse Name: N/A  . Number of Children: N/A  . Years of Education: 12   Occupational History  . Not on file.   Social History Main Topics  . Smoking status: Never Smoker   . Smokeless tobacco: Never Used  . Alcohol Use: No  . Drug Use: No  . Sexual Activity: Not on file   Other Topics Concern  . Not on file   Social History Narrative   Patient lives at home with her parents [place]ammy and [place].   Patient has a high school certificate.   Right handed.   Caffeine coke cola sometimes.               PHYSICAL EXAM  Filed Vitals:   05/07/16 1303  BP: 120/76  Pulse: 90  Resp: 20  Height: 5\' 2"  (1.575 m)  Weight: 312 lb (141.522 kg)   Body mass index is 57.05 kg/(m^2).  Generalized: Well developed, in no acute distress, obese   Neurological examination  Mentation: Alert oriented to time, place, history  taking. Follows all commands speech and language fluent Cranial nerve II-XII: Pupils were equal round reactive to light. Extraocular movements were full, visual field were full on confrontational test. Facial sensation and strength were normal. Uvula tongue midline. Head turning and shoulder shrug  were normal and symmetric. Motor: The motor testing reveals 5 over 5 strength of all 4 extremities. Good symmetric motor tone is noted throughout.  Sensory: Sensory testing is intact to soft touch on all 4 extremities. No evidence of extinction is noted.  Coordination: Cerebellar testing reveals good finger-nose-finger and heel-to-shin bilaterally.  Gait and station: Patient is unable to get up on the exam table. Gait is normal. Tandem gait not attended. Reflexes: Deep tendon reflexes are symmetric and normal bilaterally.   DIAGNOSTIC DATA (LABS, IMAGING, TESTING) - I reviewed patient records, labs, notes, testing and imaging myself where available.  ASSESSMENT AND PLAN 35 y.o. year old female  has a past medical history of Diabetes mellitus without complication (HCC); Thyroid disease; and GERD (gastroesophageal reflux disease). here with:  1. Diabetic lumbosacral plexopathy   2. Abnormality of gait  Overall the patient has remained stable. She will continue tramadol and gabapentin. I have advised that she can reduce nortriptyline to 25 mg at bedtime. If her discomfort worsens she can return to 50 mg. Patient and her mother voiced understanding. She will follow-up in 6 months with Dr. Terrace ArabiaYan.    Butch PennyMegan Hillery Zachman, MSN, NP-C 05/07/2016, 4:19 PM [facility] [address], [address] [place], [place] [address] [phone]

## 2016-05-11 NOTE — Progress Notes (Signed)
I have reviewed and agreed above plan. 

## 2016-05-21 ENCOUNTER — Other Ambulatory Visit: Payer: Self-pay | Admitting: Neurology

## 2016-06-09 ENCOUNTER — Other Ambulatory Visit: Payer: Self-pay | Admitting: Neurology

## 2016-11-11 ENCOUNTER — Ambulatory Visit: Payer: Medicaid Other | Admitting: Neurology

## 2016-12-21 ENCOUNTER — Other Ambulatory Visit: Payer: Self-pay | Admitting: Neurology

## 2017-01-25 ENCOUNTER — Encounter: Payer: Self-pay | Admitting: Neurology

## 2017-01-25 ENCOUNTER — Ambulatory Visit (INDEPENDENT_AMBULATORY_CARE_PROVIDER_SITE_OTHER): Payer: Medicaid Other | Admitting: Neurology

## 2017-01-25 VITALS — BP 102/58 | HR 104 | Ht 62.0 in | Wt 308.0 lb

## 2017-01-25 DIAGNOSIS — E08610 Diabetes mellitus due to underlying condition with diabetic neuropathic arthropathy: Secondary | ICD-10-CM

## 2017-01-25 DIAGNOSIS — G541 Lumbosacral plexus disorders: Secondary | ICD-10-CM | POA: Diagnosis not present

## 2017-01-25 DIAGNOSIS — E1144 Type 2 diabetes mellitus with diabetic amyotrophy: Secondary | ICD-10-CM | POA: Diagnosis not present

## 2017-01-25 MED ORDER — TRAMADOL HCL 50 MG PO TABS: 50.0000 mg | ORAL_TABLET | Freq: Two times a day (BID) | ORAL | 5 refills | Status: DC | PRN

## 2017-01-25 NOTE — Progress Notes (Signed)
PATIENT: Alexandra Kelley DOB: 1981/06/06  REASON FOR VISIT: follow up HISTORY FROM: patient  HISTORY OF PRESENT ILLNESS  HISTORY (Draeden Kellman): Alexandra Kelley is a 36 years old right-handed Caucasian female, accompanied by her parents, referred by podiatrist Dr. Ferman HammingBenjamin McKinney, and primary care physician Dr. Phillips OdorGolding for evaluation of bilateral lower extremity pain, initial evaluation was in June 2016.  She had past medical history of diabetes, obesity, mental retardation, she was a full-term, but with mental delay, was able to special education, had a high school graduate, at baseline, she ambulate without assistance, able to help with simple house chores.  She fell, broke her left femur in December 2014, x-ray showed comminuted predominantly oblique fracture through the mid and distal left femoral shaft. She underwent retrograde femoral nail with interlocks. By Dr. Ronnald NianMarquis in October 03 2013 under general anesthesia, plus local anesthesia  She was discharged to home in October 20 2013, able to ambulate with walker, but denies significant left femur, or left hip pain,  Around January 19th 2015, she began to complains of bilateral feet, lower extremity paresthesia, burning pain, from knee down, worsening gait difficulty, could not pick up her right foot off the floor,  Bilateral lower extremity arterial Doppler study in a poor 6 2016 showed no evidence of hemodynamic vessel occlusive disease at bilateral lower extremity, elevated ABIs bilaterally likely: secondary to distal small vessel calcification, most commonly attributed to her previous history of diabetes  Bilateral lower extremity venous Doppler ultrasound showed no evidence of deep venous thrombosis in either lower extremity  Most recent laboratory evaluation in January 2015, showed normal CBC, BMP, with exception of elevated glucose 139  She is now taking tramadol 50 mg every 6 hours, Lyrica 50 mg 3 times a day, helps her  some  She also complains of bilateral fingertips paresthesia, but denies significant upper extremity weakness   EMG/NCS showed active denervation at bilateral lower extremities, involving bilateral L4,5,S1 myotomes, more of right L4-5, left S1, but no active denervation at bilateral lumbar paraspinal muscles, with absent bilateral peroneal sensory responses, above findings support diabetic amyotrophy.   We also reviewed MRI lumbar,  MRI lumbar spine (without) demonstrating:  1. At L5-S1: far left lateral disc protrusion with potential impingement upon the exiting left L5 root; mild-moderate left foraminal stenosis.  2. At L4-5: posterior central disc protrusion (4mm); no spinal stenosis or foraminal narrowing.  MRI cervical: Disc bulging at C4-5 and C7-T1. No spinal stenosis or foraminal narrowing. No intrinsic or compressive spinal cord lesions. MRI thoracic showed no significant stenosis She denies bowel and bladder incontinence.  UPDATE Oct 8th 2015: She came in with her parents today, continues to complains of low back, leg and feet pain, reported 7 out of 10 on average, sometimes up to 10 out of 10, 25% of time to the point of tearing, has difficulty sleeping tonight because of the pain,  She has difficulty walking because of the pain, overall, her walking has improved, she no longer require a walker, has stronger right ankle, using a cane now,  She she denies no bowel bladder incontinence,  She is taking Lyrica 100 mg 3 times a day, tramadol 50 mg every 6 hours as needed  UPDATE Oct 22 2014: She sleeps better, wth nortriptuline 25mg  2 tab qhs, she still complains of back pain, Left lateral leg pain.  She is not active, sitting down most of times   UPDATE June 8th 2016: She is overall doing well, able  to ambulate with out assistant, she stays home alone, her pain is under good control now, she is taking tramadol 50 mg 3 times a day, Lyrica 100 mg 3 times a day, nortriptyline  25 mg 2 tablets every night, she tends to stay up late, she takes multiple naps during the day. She complains of excessive daytime fatigue, and sleepiness,  Update January 25 2017. She is accompanied by her mother at today's clinical visit, last visit was in July 2017, overall has much improved, but she still complaining of the lateral feet paresthesia, at the bottom of her feet, below ankle, burning pain, she is still taking gabapentin 300 mg 3 times a day, nortriptyline 25 mg every night, tramadol 50mg  4 tablets daily.  She stays at home by herself, she watchs TV, most recent A1c was between 6-7. She has worsening obesity  REVIEW OF SYSTEMS: Out of a complete 14 system review of symptoms, the patient complains only of the following symptoms, and all other reviewed systems are negative.     ALLERGIES: Allergies  Allergen Reactions  . Ceclor [Cefaclor]     Rash  . Invokana [Canagliflozin]     Severe yeast infections  . Lyrica [Pregabalin] Itching  . Metformin And Related     GI distress  . Onglyza [Saxagliptin]     headaches  . Penicillins Rash    HOME MEDICATIONS: Outpatient Medications Prior to Visit  Medication Sig Dispense Refill  . diphenhydrAMINE-zinc acetate (BENADRYL) cream Apply topically as needed for itching. 28.4 g 0  . fluticasone (FLONASE) 50 MCG/ACT nasal spray Place 2 sprays into the nose daily.    Marland Kitchen gabapentin (NEURONTIN) 300 MG capsule Take 300 mg by mouth 3 (three) times daily.  3  . glimepiride (AMARYL) 4 MG tablet Take 4 mg by mouth daily.     Marland Kitchen loratadine (CLARITIN) 10 MG tablet Take 10 mg by mouth daily as needed.     . nortriptyline (PAMELOR) 25 MG capsule Take 1-2 tablets at bedtime. 60 capsule 5  . omeprazole (PRILOSEC) 20 MG capsule Take 20 mg by mouth daily as needed.     . traMADol (ULTRAM) 50 MG tablet TAKE 1 TABLET BY MOUTH EVERY 6 HOURS AS NEEDED 120 tablet 0  . levothyroxine (SYNTHROID, LEVOTHROID) 150 MCG tablet Take 150 mcg by mouth daily.  3    No facility-administered medications prior to visit.     PAST MEDICAL HISTORY: Past Medical History:  Diagnosis Date  . Diabetes mellitus without complication (HCC)    Dr. Lurene Shadow  . GERD (gastroesophageal reflux disease)   . Thyroid disease     PAST SURGICAL HISTORY: Past Surgical History:  Procedure Laterality Date  . BACK SURGERY    . FEMUR IM NAIL Left 10/03/2013   Procedure: INTRAMEDULLARY (IM) RETROGRADE FEMORAL NAILING;  Surgeon: Eldred Manges, MD;  Location: [facility] OR;  Service: Orthopedics;  Laterality: Left;  . tubes in ears      FAMILY HISTORY: Family History  Problem Relation Age of Onset  . Hypertension Mother   . Thyroid disease Mother     SOCIAL HISTORY: Social History   Social History  . Marital status: Single    Spouse name: N/A  . Number of children: N/A  . Years of education: 5   Occupational History  . Not on file.   Social History Main Topics  . Smoking status: Never Smoker  . Smokeless tobacco: Never Used  . Alcohol use No  . Drug use: No  .  Sexual activity: Not on file   Other Topics Concern  . Not on file   Social History Narrative   Patient lives at home with her parents [place] and [place].   Patient has a high school certificate.   Right handed.   Caffeine coke cola sometimes.               PHYSICAL EXAM  Vitals:   01/25/17 0942  BP: (!) 102/58  Pulse: (!) 104  Weight: (!) 308 lb (139.7 kg)  Height: 5\' 2"  (1.575 m)   Body mass index is 56.33 kg/m.  PHYSICAL EXAMNIATION:  Gen: NAD, conversant, well nourised, obese, well groomed                     Cardiovascular: Regular rate rhythm, no peripheral edema, warm, nontender. Eyes: Conjunctivae clear without exudates or hemorrhage Neck: Supple, no carotid bruits. Pulmonary: Clear to auscultation bilaterally   NEUROLOGICAL EXAM:  MENTAL STATUS: Speech cognition:   She needs some other tonsil questions, following commands   CRANIAL NERVES: CN II: Visual fields are  full to confrontation.  Pupils are round equal and briskly reactive to light. CN III, IV, VI: extraocular movement are normal. No ptosis. CN V: Facial sensation is intact to pinprick in all 3 divisions bilaterally. Corneal responses are intact.  CN VII: Face is symmetric with normal eye closure and smile. CN VIII: Hearing is normal to rubbing fingers CN IX, X: Palate elevates symmetrically. Phonation is normal. CN XI: Head turning and shoulder shrug are intact CN XII: Tongue is midline with normal movements and no atrophy.  MOTOR: She has mild right toe flexion extension weakness, no significant proximal muscle weakness anymore.  REFLEXES: Reflexes are 2+ and symmetric at the biceps, triceps, knees, and absent at ankles. Plantar responses are flexor.  SENSORY: There is dependent decreased to light touch, pinprick and vibratory sensation at toes  COORDINATION: Rapid alternating movements and fine finger movements are intact. There is no dysmetria on finger-to-nose and heel-knee-shin.    GAIT/STANCE: She needs preceptor get up seated position, cautious, unsteady,    DIAGNOSTIC DATA (LABS, IMAGING, TESTING) - I reviewed patient records, labs, notes, testing and imaging myself where available.  ASSESSMENT AND PLAN 36 y.o. year old female  Diabetic lumbosacral plexopathy on the right side, continued bilateral lower extremity neuropathic pain. Abnormality of gait Obesity  I have advised her continue moderate exercise  Tapering off tramadol use  Continue gabapentin, nortriptyline at nighttime  Continue follow-up with her primary care    Levert Feinstein, M.D. Ph.D.  [facility] [address] [place], [place] [address] Phone: [phone] Fax:      [phone]

## 2017-05-02 ENCOUNTER — Emergency Department (HOSPITAL_COMMUNITY): Payer: Medicaid Other

## 2017-05-02 ENCOUNTER — Encounter (HOSPITAL_COMMUNITY): Payer: Self-pay | Admitting: *Deleted

## 2017-05-02 ENCOUNTER — Emergency Department (HOSPITAL_COMMUNITY)
Admission: EM | Admit: 2017-05-02 | Discharge: 2017-05-02 | Disposition: A | Discharge status: Home / Self Care | Payer: Medicaid Other | Attending: Emergency Medicine | Admitting: Emergency Medicine

## 2017-05-02 DIAGNOSIS — E079 Disorder of thyroid, unspecified: Secondary | ICD-10-CM | POA: Diagnosis not present

## 2017-05-02 DIAGNOSIS — N201 Calculus of ureter: Secondary | ICD-10-CM

## 2017-05-02 DIAGNOSIS — E119 Type 2 diabetes mellitus without complications: Secondary | ICD-10-CM | POA: Insufficient documentation

## 2017-05-02 DIAGNOSIS — R109 Unspecified abdominal pain: Secondary | ICD-10-CM

## 2017-05-02 DIAGNOSIS — N23 Unspecified renal colic: Secondary | ICD-10-CM

## 2017-05-02 DIAGNOSIS — Z79899 Other long term (current) drug therapy: Secondary | ICD-10-CM | POA: Diagnosis not present

## 2017-05-02 LAB — URINALYSIS, ROUTINE W REFLEX MICROSCOPIC
BACTERIA UA: NONE SEEN
BILIRUBIN URINE: NEGATIVE
Glucose, UA: 150 mg/dL — AB
HGB URINE DIPSTICK: NEGATIVE
KETONES UR: NEGATIVE mg/dL
NITRITE: NEGATIVE
PH: 6 (ref 5.0–8.0)
Protein, ur: NEGATIVE mg/dL
Specific Gravity, Urine: 1.01 (ref 1.005–1.030)

## 2017-05-02 LAB — PREGNANCY, URINE: Preg Test, Ur: NEGATIVE

## 2017-05-02 MED ORDER — ONDANSETRON 4 MG PO TBDP: 4.0000 mg | ORAL_TABLET | Freq: Three times a day (TID) | ORAL | 0 refills | Status: DC | PRN

## 2017-05-02 MED ORDER — OXYCODONE-ACETAMINOPHEN 5-325 MG PO TABS
1.0000 | ORAL_TABLET | Freq: Once | ORAL | Status: AC
  Administered 2017-05-02: 1 via ORAL
  Filled 2017-05-02: qty 1

## 2017-05-02 MED ORDER — HYDROCODONE-ACETAMINOPHEN 5-325 MG PO TABS: ORAL_TABLET | ORAL | 0 refills | Status: DC

## 2017-05-02 MED ORDER — ONDANSETRON 8 MG PO TBDP
8.0000 mg | ORAL_TABLET | Freq: Once | ORAL | Status: AC
  Administered 2017-05-02: 8 mg via ORAL
  Filled 2017-05-02: qty 1

## 2017-05-02 NOTE — ED Notes (Signed)
Patient transported to CT 

## 2017-05-02 NOTE — ED Triage Notes (Signed)
Pt c/o right flank pain with nausea that started last night,

## 2017-05-02 NOTE — Discharge Instructions (Signed)
Take the prescriptions as directed.  Also take over the counter ibuprofen, 2 tablets by mouth every 4 hours with food, for the next week. Call your regular medical doctor on Monday to schedule a follow up appointment within the next 3 days. Call the Urologist on Monday to schedule a follow up appointment this week.  Return to the Emergency Department immediately if worsening.

## 2017-05-02 NOTE — ED Provider Notes (Signed)
[facility] DEPT Provider Note   CSN: [id number] Arrival date & time: 05/02/17  7829     History   Chief Complaint Chief Complaint  Patient presents with  . Flank Pain    HPI Alexandra Kelley is a 36 y.o. female.  HPI  Pt was seen at 0715. Per pt, c/o sudden onset and persistence of constant right sided flank "pain" that began last night.  Pt describes the pain as "sharp," "like my last kidney stone," and radiating into the right side of her abd.  Has been associated with nausea.  Denies vomiting/diarrhea, no vaginal bleeding/discharge, no dysuria/hematuria, no abd pain, no CP/SOB, no fevers, no rash.    Past Medical History:  Diagnosis Date  . Diabetes mellitus without complication (HCC)    Dr. Lurene Shadow  . GERD (gastroesophageal reflux disease)   . Kidney stone   . Thyroid disease     Patient Active Problem List   Diagnosis Date Noted  . Diabetic lumbosacral plexopathy (HCC) 05/10/2014  . Abnormality of gait 03/29/2014  . Muscle weakness (generalized) 11/20/2013  . Pain, joint, lower leg, left 11/20/2013  . Difficulty in walking(719.7) 11/20/2013  . Femur fracture, left (HCC) 10/09/2013  . Acute urinary retention 10/04/2013  . Acute-on-chronic respiratory failure (HCC) 10/04/2013  . Femur fracture (HCC) 10/03/2013  . Closed displaced oblique fracture of shaft of left femur (HCC) 10/03/2013  . Hypothyroidism 10/03/2013  . Diabetes (HCC) 10/03/2013    Past Surgical History:  Procedure Laterality Date  . BACK SURGERY    . FEMUR IM NAIL Left 10/03/2013   Procedure: INTRAMEDULLARY (IM) RETROGRADE FEMORAL NAILING;  Surgeon: Eldred Manges, MD;  Location: [facility] OR;  Service: Orthopedics;  Laterality: Left;  . tubes in ears      OB History    No data available       Home Medications    Prior to Admission medications   Medication Sig Start Date End Date Taking? Authorizing Provider  diphenhydrAMINE-zinc acetate (BENADRYL) cream Apply topically as needed for  itching. 10/09/13   Mikhail, Nita Sells, DO  fluticasone (FLONASE) 50 MCG/ACT nasal spray Place 2 sprays into the nose daily.    [provider]  gabapentin (NEURONTIN) 300 MG capsule Take 300 mg by mouth 3 (three) times daily. 03/27/16   [provider]  glimepiride (AMARYL) 4 MG tablet Take 4 mg by mouth daily.     [provider]  levothyroxine (SYNTHROID, LEVOTHROID) 175 MCG tablet Take 175 mcg by mouth daily. 12/31/16   [provider]  loratadine (CLARITIN) 10 MG tablet Take 10 mg by mouth daily as needed.     [provider]  nortriptyline (PAMELOR) 25 MG capsule Take 1-2 tablets at bedtime. 06/10/16   Levert Feinstein, MD  omeprazole (PRILOSEC) 20 MG capsule Take 20 mg by mouth daily as needed.     [provider]  traMADol (ULTRAM) 50 MG tablet Take 1 tablet (50 mg total) by mouth every 12 (twelve) hours as needed. 01/25/17   Levert Feinstein, MD    Family History Family History  Problem Relation Age of Onset  . Hypertension Mother   . Thyroid disease Mother     Social History Social History  Substance Use Topics  . Smoking status: Never Smoker  . Smokeless tobacco: Never Used  . Alcohol use No     Allergies   Ceclor [cefaclor]; Invokana [canagliflozin]; Lyrica [pregabalin]; Metformin and related; Onglyza [saxagliptin]; and Penicillins   Review of Systems Review of Systems ROS:  Statement: All systems negative except as marked or noted in the HPI; Constitutional: Negative for fever and chills. ; ; Eyes: Negative for eye pain, redness and discharge. ; ; ENMT: Negative for ear pain, hoarseness, nasal congestion, sinus pressure and sore throat. ; ; Cardiovascular: Negative for chest pain, palpitations, diaphoresis, dyspnea and peripheral edema. ; ; Respiratory: Negative for cough, wheezing and stridor. ; ; Gastrointestinal: +nausea. Negative for vomiting, diarrhea, abdominal pain, blood in stool, hematemesis, jaundice and rectal bleeding. . ; ;  Genitourinary: +flank pain. Negative for dysuria and hematuria. ; ; Musculoskeletal: Negative for back pain and neck pain. Negative for swelling and trauma.; ; Skin: Negative for pruritus, rash, abrasions, blisters, bruising and skin lesion.; ; Neuro: Negative for headache, lightheadedness and neck stiffness. Negative for weakness, altered level of consciousness, altered mental status, extremity weakness, paresthesias, involuntary movement, seizure and syncope.       Physical Exam Updated Vital Signs BP 138/69   Pulse (!) 112   Temp 98.2 F (36.8 C) (Oral)   Resp 18   Wt 136.1 kg (300 lb)   SpO2 96%   BMI 54.87 kg/m   Physical Exam 0720: Physical examination:  Nursing notes reviewed; Vital signs and O2 SAT reviewed;  Constitutional: Well developed, Well nourished, Well hydrated, In no acute distress; Head:  Normocephalic, atraumatic; Eyes: EOMI, PERRL, No scleral icterus; ENMT: Mouth and pharynx normal, Mucous membranes moist; Neck: Supple, Full range of motion, No lymphadenopathy; Cardiovascular: Regular rate and rhythm, No gallop; Respiratory: Breath sounds clear & equal bilaterally, No wheezes.  Speaking full sentences with ease, Normal respiratory effort/excursion; Chest: Nontender, Movement normal; Abdomen: Soft, Nontender, Nondistended, Normal bowel sounds; Genitourinary: No CVA tenderness; Spine:  No midline CS, TS, LS tenderness. +TTP right lumbar paraspinal muscles.;; Extremities: Pulses normal, No tenderness, No edema, No calf edema or asymmetry.; Neuro: AA&Ox3, Major CN grossly intact.  Speech clear. Moves all extremities spontaneously on stretcher.; Skin: Color normal, Warm, Dry.   ED Treatments / Results  Labs (all labs ordered are listed, but only abnormal results are displayed)   EKG  EKG Interpretation None       Radiology   Procedures Procedures (including critical care time)  Medications Ordered in ED Medications  oxyCODONE-acetaminophen (PERCOCET/ROXICET)  5-325 MG per tablet 1 tablet (not administered)  ondansetron (ZOFRAN-ODT) disintegrating tablet 8 mg (not administered)     Initial Impression / Assessment and Plan / ED Course  I have reviewed the triage vital signs and the nursing notes.  Pertinent labs & imaging results that were available during my care of the patient were reviewed by me and considered in my medical decision making (see chart for details).  MDM Reviewed: previous chart, nursing note and vitals Interpretation: labs and CT scan   Results for orders placed or performed during the hospital encounter of 05/02/17  Urinalysis, Routine w reflex microscopic  Result Value Ref Range   Color, Urine YELLOW YELLOW   APPearance CLEAR CLEAR   Specific Gravity, Urine 1.010 1.005 - 1.030   pH 6.0 5.0 - 8.0   Glucose, UA 150 (A) NEGATIVE mg/dL   Hgb urine dipstick NEGATIVE NEGATIVE   Bilirubin Urine NEGATIVE NEGATIVE   Ketones, ur NEGATIVE NEGATIVE mg/dL   Protein, ur NEGATIVE NEGATIVE mg/dL   Nitrite NEGATIVE NEGATIVE   Leukocytes, UA MODERATE (A) NEGATIVE   RBC / HPF 0-5 0 - 5 RBC/hpf   WBC, UA 6-30 0 - 5 WBC/hpf   Bacteria, UA NONE SEEN NONE SEEN  Squamous Epithelial / LPF 0-5 (A) NONE SEEN  Pregnancy, urine  Result Value Ref Range   Preg Test, Ur NEGATIVE NEGATIVE   Ct Renal Stone Study Result Date: 05/02/2017 CLINICAL DATA:  Right flank pain.  Nephrolithiasis. EXAM: CT ABDOMEN AND PELVIS WITHOUT CONTRAST TECHNIQUE: Multidetector CT imaging of the abdomen and pelvis was performed following the standard protocol without IV contrast. COMPARISON:  10/21/2009 FINDINGS: Lower chest: No acute findings. Hepatobiliary: No masses visualized on this unenhanced exam. Gallbladder is unremarkable. Pancreas: No mass or inflammatory process visualized on this unenhanced exam. Spleen:  Within normal limits in size. Adrenals/Urinary tract: Small intrarenal calculi are seen bilaterally, largest in lower pole of right kidney measuring 9 mm.  Mild right hydronephrosis is seen due to a 3 mm calculus in the mid right ureter. No evidence of left-sided ureteral calculi or hydronephrosis. Unremarkable unopacified urinary bladder. Stomach/Bowel: No evidence of obstruction, inflammatory process, or abnormal fluid collections. Vascular/Lymphatic: No pathologically enlarged lymph nodes identified. No evidence of abdominal aortic aneurysm. Aortic atherosclerosis. Reproductive:  No mass or other significant abnormality. Other: Stable small paraumbilical hernia containing a loop of small bowel. No evidence of small bowel obstruction or ischemia. Musculoskeletal:  No suspicious bone lesions identified. IMPRESSION: Mild right hydronephrosis due to 3 mm calculus in the mid right ureter. Bilateral nephrolithiasis. Stable small paraumbilical hernia. Aortic Atherosclerosis (ICD10-I70.0). Electronically Signed   By: Myles RosenthalJohn  Stahl M.D.   On: 05/02/2017 09:58    1045:  No clear UTI on Udip; UC pending and pt denies dysuria. Feels improved after meds and wants to go home now. Tx symptomatically, f/u Uro MD. Dx and testing d/w pt and family.  Questions answered.  Verb understanding, agreeable to d/c home with outpt f/u.   Final Clinical Impressions(s) / ED Diagnoses   Final diagnoses:  None    New Prescriptions New Prescriptions   No medications on file     Samuel JesterMcManus, Arshia Spellman, DO 05/09/17 1059

## 2017-05-04 LAB — URINE CULTURE

## 2017-08-03 ENCOUNTER — Other Ambulatory Visit: Payer: Self-pay | Admitting: Neurology

## 2017-08-03 NOTE — Telephone Encounter (Signed)
We received refill request from pharmacy for Tramadol . Last OV note recommends continued follow up with PCP. Dr. Terrace Arabia, is it ok to refill or does the patient need to send request to PCP?

## 2017-08-03 NOTE — Telephone Encounter (Signed)
Rx faxed to pharmacy  

## 2017-08-04 ENCOUNTER — Telehealth: Payer: Self-pay | Admitting: Neurology

## 2017-08-04 NOTE — Telephone Encounter (Signed)
Called to the pharmacy - they did not receive the prescription.  It has been re-faxed and confirmed.  Pt's mother is aware.

## 2017-08-04 NOTE — Telephone Encounter (Signed)
Patients mother Tammy called office to advise nurse [facility] in [place] did not receive fax to refill patients Tramadol.

## 2017-08-05 NOTE — Telephone Encounter (Signed)
Tramadol PA initiated, via paper fax, with [place] [employer] (#[phone], fax#[phone]).  Pt MV#[id number] N.  Decision pending.  Left pt's mother a message letting her know the status.

## 2017-08-05 NOTE — Telephone Encounter (Signed)
Patient mother Babette Relic called stating that she spoke to the pharmacy and they said they need PA from the Doctor's office. She stated that she purchased a small amount. The mother stated you can call her at her work number at [phone].

## 2017-08-10 NOTE — Telephone Encounter (Signed)
PA approved by [facility] Tr[employer] [phone]) through 02/01/18 - JW#[id number] - Pt YQ#[id number] N.  Pharmacy aware.

## 2017-09-03 ENCOUNTER — Ambulatory Visit (INDEPENDENT_AMBULATORY_CARE_PROVIDER_SITE_OTHER): Payer: Medicaid Other | Admitting: Urology

## 2017-09-03 ENCOUNTER — Other Ambulatory Visit (HOSPITAL_COMMUNITY)
Admission: AD | Admit: 2017-09-03 | Discharge: 2017-09-03 | Disposition: A | Discharge status: Home / Self Care | Payer: Medicaid Other | Source: Other Acute Inpatient Hospital | Attending: Urology | Admitting: Urology

## 2017-09-03 DIAGNOSIS — N200 Calculus of kidney: Secondary | ICD-10-CM | POA: Diagnosis not present

## 2017-09-03 LAB — URINALYSIS, COMPLETE (UACMP) WITH MICROSCOPIC
Bilirubin Urine: NEGATIVE
Glucose, UA: NEGATIVE mg/dL
Ketones, ur: NEGATIVE mg/dL
Nitrite: NEGATIVE
PROTEIN: 30 mg/dL — AB
Specific Gravity, Urine: 1.02 (ref 1.005–1.030)
pH: 5 (ref 5.0–8.0)

## 2017-09-07 LAB — URINE CULTURE: Culture: 30000 — AB

## 2017-09-08 ENCOUNTER — Other Ambulatory Visit: Payer: Self-pay | Admitting: Urology

## 2017-09-08 ENCOUNTER — Ambulatory Visit (HOSPITAL_COMMUNITY)
Admission: RE | Admit: 2017-09-08 | Discharge: 2017-09-08 | Disposition: A | Discharge status: Home / Self Care | Payer: Medicaid Other | Source: Ambulatory Visit | Attending: Urology | Admitting: Urology

## 2017-09-08 DIAGNOSIS — N200 Calculus of kidney: Secondary | ICD-10-CM | POA: Diagnosis present

## 2017-09-13 ENCOUNTER — Other Ambulatory Visit: Payer: Self-pay | Admitting: Urology

## 2017-09-13 DIAGNOSIS — N200 Calculus of kidney: Secondary | ICD-10-CM

## 2017-09-22 ENCOUNTER — Ambulatory Visit (HOSPITAL_COMMUNITY): Payer: Medicaid Other

## 2017-09-29 ENCOUNTER — Ambulatory Visit (HOSPITAL_COMMUNITY)
Admission: RE | Admit: 2017-09-29 | Discharge: 2017-09-29 | Disposition: A | Discharge status: Home / Self Care | Payer: Medicaid Other | Source: Ambulatory Visit | Attending: Urology | Admitting: Urology

## 2017-09-29 DIAGNOSIS — N200 Calculus of kidney: Secondary | ICD-10-CM | POA: Diagnosis not present

## 2017-09-29 DIAGNOSIS — N1339 Other hydronephrosis: Secondary | ICD-10-CM | POA: Insufficient documentation

## 2018-02-16 ENCOUNTER — Other Ambulatory Visit (HOSPITAL_COMMUNITY): Payer: Self-pay | Admitting: Physician Assistant

## 2018-02-16 ENCOUNTER — Ambulatory Visit (HOSPITAL_COMMUNITY)
Admission: RE | Admit: 2018-02-16 | Discharge: 2018-02-16 | Disposition: A | Discharge status: Home / Self Care | Payer: Medicaid Other | Source: Ambulatory Visit | Attending: Physician Assistant | Admitting: Physician Assistant

## 2018-02-16 DIAGNOSIS — M419 Scoliosis, unspecified: Secondary | ICD-10-CM | POA: Insufficient documentation

## 2018-02-16 DIAGNOSIS — M545 Low back pain: Secondary | ICD-10-CM | POA: Diagnosis present

## 2018-02-16 DIAGNOSIS — M47816 Spondylosis without myelopathy or radiculopathy, lumbar region: Secondary | ICD-10-CM | POA: Diagnosis not present

## 2018-02-16 DIAGNOSIS — N200 Calculus of kidney: Secondary | ICD-10-CM | POA: Insufficient documentation

## 2018-03-04 ENCOUNTER — Ambulatory Visit: Payer: Medicaid Other | Admitting: Urology

## 2018-03-04 ENCOUNTER — Other Ambulatory Visit (HOSPITAL_COMMUNITY)
Admission: AD | Admit: 2018-03-04 | Discharge: 2018-03-04 | Disposition: A | Discharge status: Home / Self Care | Payer: Medicaid Other | Source: Other Acute Inpatient Hospital | Attending: Urology | Admitting: Urology

## 2018-03-04 DIAGNOSIS — N200 Calculus of kidney: Secondary | ICD-10-CM | POA: Diagnosis not present

## 2018-03-04 DIAGNOSIS — R81 Glycosuria: Secondary | ICD-10-CM

## 2018-03-10 LAB — STONE ANALYSIS
CA OXALATE, MONOHYDR.: 65 %
Ca phos cry stone ql IR: 35 %
Stone Weight KSTONE: 34.6 mg

## 2018-09-07 ENCOUNTER — Encounter (INDEPENDENT_AMBULATORY_CARE_PROVIDER_SITE_OTHER): Payer: Self-pay

## 2018-09-07 ENCOUNTER — Ambulatory Visit: Payer: Medicaid Other | Admitting: Adult Health

## 2018-09-07 ENCOUNTER — Other Ambulatory Visit (HOSPITAL_COMMUNITY)
Admission: RE | Admit: 2018-09-07 | Discharge: 2018-09-07 | Disposition: A | Discharge status: Home / Self Care | Payer: Medicaid Other | Source: Ambulatory Visit | Attending: Adult Health | Admitting: Adult Health

## 2018-09-07 ENCOUNTER — Encounter: Payer: Self-pay | Admitting: Adult Health

## 2018-09-07 VITALS — BP 105/58 | HR 106 | Wt 314.0 lb

## 2018-09-07 DIAGNOSIS — N926 Irregular menstruation, unspecified: Secondary | ICD-10-CM | POA: Diagnosis not present

## 2018-09-07 DIAGNOSIS — Z01419 Encounter for gynecological examination (general) (routine) without abnormal findings: Secondary | ICD-10-CM

## 2018-09-07 DIAGNOSIS — Z0000 Encounter for general adult medical examination without abnormal findings: Secondary | ICD-10-CM | POA: Diagnosis not present

## 2018-09-07 NOTE — Progress Notes (Signed)
Patient ID: Alexandra Kelley, female   DOB: 05-Feb-1981, 37 y.o.   MRN: [id number] History of Present Illness: Alexandra Kelley is a 37 year old white female, single, G0P0, in with her mom complaining, of irregular bleeding. She did not start her period til early 35's, and now will bleeding then stop then bleed again, it it is usually light.She is not sexually active, and she lives with her parents, who have guardianship.  PCP is Dr Phillips Odor and she sees Dr Horald Pollen.    Current Medications, Allergies, Past Medical History, Past Surgical History, Family History and Social History were reviewed in [employer] record.     Review of Systems: Did not start period till in 30"s Has irregular bleeding, will bleed then stop then bleed again, has had thyroid meds adjusted and has helped, but not this time  Bleeding is light Has never had sex   Physical Exam:BP (!) 105/58 (BP Location: Left Arm, Patient Position: Sitting, Cuff Size: Normal)   Pulse (!) 106   Wt (!) 314 lb (142.4 kg)   BMI 57.43 kg/m  General:  Well developed, obese, no acute distress Skin:  Warm and dry Neck:  Midline trachea, normal thyroid, good ROM, no lymphadenopathy Lungs; Clear to auscultation bilaterally Cardiovascular: Regular rate and rhythm Pelvic:  External genitalia is normal in appearance, no lesions. Small peterson speculum used. The vagina is normal in appearance, with pink discharge. Urethra not visualized. The cervix is not visualized, blind sweep pap performed with HPV.  Uterus is not felt secondary to abdominal girth.  No adnexal masses or tenderness noted.Bladder is non tender, no masses felt. Pt has large panniculus.  Psych:  No mood changes, alert and cooperative,seems happy PHQ 2 score 0. Examination chaperoned by Malachy Mood LPN. Face time 30 minutes with 50% talking with pt and coordinating care.   Impression:  1. Irregular bleeding   2. Pap smear, low-risk      Plan: Pap with HPV  sent Check Prolactin,LH,estradiol, FSH(had normal TSH per mom) Return in about 2 weeks for GYN [place] to assess uterus  Will talk when results back

## 2018-09-08 ENCOUNTER — Telehealth: Payer: Self-pay | Admitting: Adult Health

## 2018-09-08 LAB — LUTEINIZING HORMONE: LH: 11.4 m[IU]/mL

## 2018-09-08 LAB — PROLACTIN: Prolactin: 7.6 ng/mL (ref 4.8–23.3)

## 2018-09-08 LAB — ESTRADIOL: ESTRADIOL: 49.2 pg/mL

## 2018-09-08 LAB — FOLLICLE STIMULATING HORMONE: FSH: 16.6 m[IU]/mL

## 2018-09-08 NOTE — Telephone Encounter (Signed)
Pt aware that labs normal  

## 2018-09-12 LAB — CYTOLOGY - PAP: HPV: NOT DETECTED

## 2018-09-22 ENCOUNTER — Encounter: Payer: Self-pay | Admitting: Adult Health

## 2018-09-22 ENCOUNTER — Ambulatory Visit: Payer: Medicaid Other | Admitting: Adult Health

## 2018-09-22 ENCOUNTER — Ambulatory Visit (INDEPENDENT_AMBULATORY_CARE_PROVIDER_SITE_OTHER): Payer: Medicaid Other

## 2018-09-22 VITALS — BP 102/68 | HR 103 | Ht <= 58 in | Wt 314.8 lb

## 2018-09-22 DIAGNOSIS — N926 Irregular menstruation, unspecified: Secondary | ICD-10-CM | POA: Diagnosis not present

## 2018-09-22 MED ORDER — MEDROXYPROGESTERONE ACETATE 10 MG PO TABS: ORAL_TABLET | ORAL | 6 refills | Status: DC

## 2018-09-22 NOTE — Progress Notes (Signed)
PELVIC US TA ONLY: unable to visualized uterus and ovaries because of pt body habitus,TV was attempted,but patient refused to continue exam.

## 2018-09-22 NOTE — Progress Notes (Signed)
  Subjective:     Patient ID: Alexandra Kelley, female   DOB: 08-23-1981, 37 y.o.   MRN: [id number]  HPI Alexandra Kelley is a 37 year old white female, back in follow up.Had period this month, just fine.   Review of Systems Irregular bleeding with periods Reviewed past medical,surgical, social and family history. Reviewed medications and allergies.     Objective:   Physical Exam BP 102/68 (BP Location: Left Arm, Patient Position: Sitting, Cuff Size: Normal)   Pulse (!) 103   Ht 4\' 10"  (1.473 m)   Wt (!) 314 lb 12.8 oz (142.8 kg)   LMP 09/16/2018   BMI 65.79 kg/m   Pap showed negative HPV, but unsatisfactory. US was not completed today, due to not beng able to see through abdominal girth and she declined vaginal due to it hurting. FSH,prolactin,LH and estradiol all normal.  Discussed with Dr Despina HiddenEure, and will cycle with provera 10 mg first 10 days of every 4th month. Will repeat pap in 1 year. Fall risk is low.    Assessment:     1. Irregular bleeding       Plan:     Meds ordered this encounter  Medications  . medroxyPROGESTERone (PROVERA) 10 MG tablet    Sig: Take 1 daily for 10 days,first 10 days of December,march,june,september and December    Dispense:  10 tablet    Refill:  6    Order Specific Question:   Supervising Provider    Answer:   Duane LopeEURE, LUTHER H [[id number]]  F/U in 3 months  Will repeat pap in 1 year

## 2018-10-17 ENCOUNTER — Observation Stay (HOSPITAL_COMMUNITY)
Admission: EM | Admit: 2018-10-17 | Discharge: 2018-10-18 | Disposition: A | Discharge status: Home / Self Care | Payer: Medicaid Other | Attending: Internal Medicine | Admitting: Internal Medicine

## 2018-10-17 ENCOUNTER — Emergency Department (HOSPITAL_COMMUNITY): Payer: Medicaid Other

## 2018-10-17 ENCOUNTER — Other Ambulatory Visit: Payer: Self-pay

## 2018-10-17 ENCOUNTER — Encounter (HOSPITAL_COMMUNITY): Payer: Self-pay | Admitting: *Deleted

## 2018-10-17 DIAGNOSIS — R1084 Generalized abdominal pain: Secondary | ICD-10-CM

## 2018-10-17 DIAGNOSIS — R739 Hyperglycemia, unspecified: Secondary | ICD-10-CM | POA: Diagnosis not present

## 2018-10-17 DIAGNOSIS — Z79899 Other long term (current) drug therapy: Secondary | ICD-10-CM | POA: Insufficient documentation

## 2018-10-17 DIAGNOSIS — E039 Hypothyroidism, unspecified: Secondary | ICD-10-CM | POA: Diagnosis present

## 2018-10-17 DIAGNOSIS — R0602 Shortness of breath: Secondary | ICD-10-CM | POA: Insufficient documentation

## 2018-10-17 DIAGNOSIS — E1144 Type 2 diabetes mellitus with diabetic amyotrophy: Secondary | ICD-10-CM | POA: Diagnosis not present

## 2018-10-17 DIAGNOSIS — E860 Dehydration: Principal | ICD-10-CM | POA: Diagnosis present

## 2018-10-17 DIAGNOSIS — R000 Tachycardia, unspecified: Secondary | ICD-10-CM | POA: Diagnosis present

## 2018-10-17 DIAGNOSIS — Q909 Down syndrome, unspecified: Secondary | ICD-10-CM | POA: Diagnosis not present

## 2018-10-17 DIAGNOSIS — IMO0002 Reserved for concepts with insufficient information to code with codable children: Secondary | ICD-10-CM | POA: Diagnosis present

## 2018-10-17 DIAGNOSIS — E1165 Type 2 diabetes mellitus with hyperglycemia: Secondary | ICD-10-CM | POA: Insufficient documentation

## 2018-10-17 DIAGNOSIS — G541 Lumbosacral plexus disorders: Secondary | ICD-10-CM | POA: Diagnosis not present

## 2018-10-17 DIAGNOSIS — R262 Difficulty in walking, not elsewhere classified: Secondary | ICD-10-CM | POA: Diagnosis present

## 2018-10-17 DIAGNOSIS — R1013 Epigastric pain: Secondary | ICD-10-CM | POA: Diagnosis present

## 2018-10-17 DIAGNOSIS — R112 Nausea with vomiting, unspecified: Secondary | ICD-10-CM

## 2018-10-17 LAB — COMPREHENSIVE METABOLIC PANEL
ALBUMIN: 4.7 g/dL (ref 3.5–5.0)
ALT: 26 U/L (ref 0–44)
AST: 30 U/L (ref 15–41)
Alkaline Phosphatase: 85 U/L (ref 38–126)
Anion gap: 14 (ref 5–15)
BUN: 9 mg/dL (ref 6–20)
CO2: 27 mmol/L (ref 22–32)
CREATININE: 0.88 mg/dL (ref 0.44–1.00)
Calcium: 10.2 mg/dL (ref 8.9–10.3)
Chloride: 94 mmol/L — ABNORMAL LOW (ref 98–111)
GFR calc Af Amer: >60 mL/min (ref >60)
GLUCOSE: 371 mg/dL — AB (ref 70–99)
Potassium: 4.3 mmol/L (ref 3.5–5.1)
SODIUM: 135 mmol/L (ref 135–145)
Total Bilirubin: 1 mg/dL (ref 0.3–1.2)
Total Protein: 9.6 g/dL — ABNORMAL HIGH (ref 6.5–8.1)

## 2018-10-17 LAB — CBC
HCT: 46.9 % — ABNORMAL HIGH (ref 36.0–46.0)
HCT: 40.3 % (ref 36.0–46.0)
HEMOGLOBIN: 14.8 g/dL (ref 12.0–15.0)
Hemoglobin: 12.9 g/dL (ref 12.0–15.0)
MCH: 27.5 pg (ref 26.0–34.0)
MCH: 27.8 pg (ref 26.0–34.0)
MCHC: 31.6 g/dL (ref 30.0–36.0)
MCHC: 32 g/dL (ref 30.0–36.0)
MCV: 87.2 fL (ref 80.0–100.0)
MCV: 86.9 fL (ref 80.0–100.0)
Platelets: 273 10*3/uL (ref 150–400)
Platelets: 251 10*3/uL (ref 150–400)
RBC: 5.38 MIL/uL — ABNORMAL HIGH (ref 3.87–5.11)
RBC: 4.64 MIL/uL (ref 3.87–5.11)
RDW: 14.5 % (ref 11.5–15.5)
RDW: 14.6 % (ref 11.5–15.5)
WBC: 12.9 10*3/uL — ABNORMAL HIGH (ref 4.0–10.5)
WBC: 9.8 10*3/uL (ref 4.0–10.5)
nRBC: 0 % (ref 0.0–0.2)
nRBC: 0 % (ref 0.0–0.2)

## 2018-10-17 LAB — I-STAT BETA HCG BLOOD, ED (MC, WL, AP ONLY)

## 2018-10-17 LAB — I-STAT CG4 LACTIC ACID, ED: Lactic Acid, Venous: 2.47 mmol/L (ref 0.5–1.9)

## 2018-10-17 LAB — LACTIC ACID, PLASMA
Lactic Acid, Venous: 2.2 mmol/L (ref 0.5–1.9)
Lactic Acid, Venous: 2.9 mmol/L (ref 0.5–1.9)
Lactic Acid, Venous: 1.3 mmol/L (ref 0.5–1.9)

## 2018-10-17 LAB — HEMOGLOBIN A1C
Hgb A1c MFr Bld: 7.6 % — ABNORMAL HIGH (ref 4.8–5.6)
Mean Plasma Glucose: 171.42 mg/dL

## 2018-10-17 LAB — URINALYSIS, ROUTINE W REFLEX MICROSCOPIC
BACTERIA UA: NONE SEEN
Bilirubin Urine: NEGATIVE
Glucose, UA: >=500 mg/dL — AB
Hgb urine dipstick: NEGATIVE
Ketones, ur: 20 mg/dL — AB
Nitrite: NEGATIVE
PROTEIN: NEGATIVE mg/dL
SPECIFIC GRAVITY, URINE: 1.023 (ref 1.005–1.030)
pH: 7 (ref 5.0–8.0)

## 2018-10-17 LAB — CREATININE, SERUM
Creatinine, Ser: 0.79 mg/dL (ref 0.44–1.00)
GFR calc Af Amer: >60 mL/min (ref >60)
GFR calc non Af Amer: >60 mL/min (ref >60)

## 2018-10-17 LAB — TSH: TSH: 1.812 u[IU]/mL (ref 0.350–4.500)

## 2018-10-17 LAB — LIPASE, BLOOD: Lipase: 49 U/L (ref 11–51)

## 2018-10-17 LAB — GLUCOSE, CAPILLARY
Glucose-Capillary: 246 mg/dL — ABNORMAL HIGH (ref 70–99)
Glucose-Capillary: 192 mg/dL — ABNORMAL HIGH (ref 70–99)

## 2018-10-17 LAB — CBG MONITORING, ED: Glucose-Capillary: 308 mg/dL — ABNORMAL HIGH (ref 70–99)

## 2018-10-17 MED ORDER — LEVOTHYROXINE SODIUM 100 MCG PO TABS
200.0000 ug | ORAL_TABLET | Freq: Every day | ORAL | Status: DC
  Administered 2018-10-18: 200 ug via ORAL
  Filled 2018-10-17: qty 2

## 2018-10-17 MED ORDER — INSULIN GLARGINE 100 UNIT/ML ~~LOC~~ SOLN
15.0000 [IU] | Freq: Every day | SUBCUTANEOUS | Status: DC
  Administered 2018-10-17 – 2018-10-18 (×2): 15 [IU] via SUBCUTANEOUS
  Filled 2018-10-17 (×5): qty 0.15

## 2018-10-17 MED ORDER — SODIUM CHLORIDE 0.9 % IV BOLUS
1000.0000 mL | Freq: Once | INTRAVENOUS | Status: AC
  Administered 2018-10-17: 1000 mL via INTRAVENOUS

## 2018-10-17 MED ORDER — SODIUM CHLORIDE 0.9 % IV BOLUS
500.0000 mL | Freq: Once | INTRAVENOUS | Status: AC
  Administered 2018-10-17: 500 mL via INTRAVENOUS

## 2018-10-17 MED ORDER — INSULIN ASPART 100 UNIT/ML ~~LOC~~ SOLN
0.0000 [IU] | Freq: Three times a day (TID) | SUBCUTANEOUS | Status: DC
  Administered 2018-10-17: 5 [IU] via SUBCUTANEOUS
  Administered 2018-10-18 (×2): 3 [IU] via SUBCUTANEOUS
  Administered 2018-10-18: 8 [IU] via SUBCUTANEOUS

## 2018-10-17 MED ORDER — INSULIN ASPART 100 UNIT/ML ~~LOC~~ SOLN: 0.0000 [IU] | Freq: Every day | SUBCUTANEOUS | Status: DC

## 2018-10-17 MED ORDER — BISACODYL 5 MG PO TBEC: 5.0000 mg | DELAYED_RELEASE_TABLET | Freq: Every day | ORAL | Status: DC | PRN

## 2018-10-17 MED ORDER — ACETAMINOPHEN 325 MG PO TABS
650.0000 mg | ORAL_TABLET | Freq: Four times a day (QID) | ORAL | Status: DC | PRN
  Administered 2018-10-17 – 2018-10-18 (×3): 650 mg via ORAL
  Filled 2018-10-17 (×3): qty 2

## 2018-10-17 MED ORDER — LEVOFLOXACIN IN D5W 500 MG/100ML IV SOLN
500.0000 mg | INTRAVENOUS | Status: DC
  Administered 2018-10-17: 500 mg via INTRAVENOUS
  Filled 2018-10-17: qty 100

## 2018-10-17 MED ORDER — MORPHINE SULFATE (PF) 4 MG/ML IV SOLN
4.0000 mg | Freq: Once | INTRAVENOUS | Status: AC
  Administered 2018-10-17: 4 mg via INTRAVENOUS
  Filled 2018-10-17: qty 1

## 2018-10-17 MED ORDER — TRAMADOL HCL 50 MG PO TABS
50.0000 mg | ORAL_TABLET | Freq: Two times a day (BID) | ORAL | Status: DC | PRN
  Administered 2018-10-17 – 2018-10-18 (×2): 50 mg via ORAL
  Filled 2018-10-17 (×2): qty 1

## 2018-10-17 MED ORDER — ENOXAPARIN SODIUM 40 MG/0.4ML ~~LOC~~ SOLN: 40.0000 mg | SUBCUTANEOUS | Status: DC

## 2018-10-17 MED ORDER — METOCLOPRAMIDE HCL 5 MG/ML IJ SOLN
10.0000 mg | Freq: Once | INTRAMUSCULAR | Status: AC
  Administered 2018-10-17: 10 mg via INTRAVENOUS
  Filled 2018-10-17: qty 2

## 2018-10-17 MED ORDER — NORTRIPTYLINE HCL 25 MG PO CAPS
25.0000 mg | ORAL_CAPSULE | Freq: Every day | ORAL | Status: DC
  Administered 2018-10-17: 25 mg via ORAL
  Filled 2018-10-17 (×3): qty 1

## 2018-10-17 MED ORDER — LEVOTHYROXINE SODIUM 100 MCG PO TABS: 200.0000 ug | ORAL_TABLET | Freq: Every day | ORAL | Status: DC

## 2018-10-17 MED ORDER — INSULIN ASPART 100 UNIT/ML ~~LOC~~ SOLN
5.0000 [IU] | Freq: Three times a day (TID) | SUBCUTANEOUS | Status: DC
  Administered 2018-10-17 – 2018-10-18 (×3): 5 [IU] via SUBCUTANEOUS

## 2018-10-17 MED ORDER — DIPHENHYDRAMINE HCL 50 MG/ML IJ SOLN
25.0000 mg | Freq: Once | INTRAMUSCULAR | Status: AC
  Administered 2018-10-17: 25 mg via INTRAVENOUS
  Filled 2018-10-17: qty 1

## 2018-10-17 MED ORDER — GABAPENTIN 300 MG PO CAPS
300.0000 mg | ORAL_CAPSULE | Freq: Three times a day (TID) | ORAL | Status: DC
  Administered 2018-10-17 – 2018-10-18 (×4): 300 mg via ORAL
  Filled 2018-10-17: qty 3
  Filled 2018-10-17: qty 1
  Filled 2018-10-17 (×2): qty 3
  Filled 2018-10-17: qty 1

## 2018-10-17 MED ORDER — PROMETHAZINE HCL 12.5 MG PO TABS: 12.5000 mg | ORAL_TABLET | Freq: Four times a day (QID) | ORAL | Status: DC | PRN

## 2018-10-17 MED ORDER — ACETAMINOPHEN 650 MG RE SUPP: 650.0000 mg | Freq: Four times a day (QID) | RECTAL | Status: DC | PRN

## 2018-10-17 MED ORDER — MORPHINE SULFATE (PF) 2 MG/ML IV SOLN: 1.0000 mg | INTRAVENOUS | Status: DC | PRN

## 2018-10-17 MED ORDER — ENOXAPARIN SODIUM 80 MG/0.8ML ~~LOC~~ SOLN
70.0000 mg | SUBCUTANEOUS | Status: DC
  Administered 2018-10-17 – 2018-10-18 (×2): 70 mg via SUBCUTANEOUS
  Filled 2018-10-17 (×2): qty 0.8

## 2018-10-17 MED ORDER — FENTANYL CITRATE (PF) 100 MCG/2ML IJ SOLN
50.0000 ug | Freq: Once | INTRAMUSCULAR | Status: AC
  Administered 2018-10-17: 50 ug via INTRAVENOUS
  Filled 2018-10-17: qty 2

## 2018-10-17 MED ORDER — IOPAMIDOL (ISOVUE-300) INJECTION 61%
100.0000 mL | Freq: Once | INTRAVENOUS | Status: AC | PRN
  Administered 2018-10-17: 100 mL via INTRAVENOUS

## 2018-10-17 MED ORDER — SODIUM CHLORIDE 0.9 % IV SOLN
INTRAVENOUS | Status: DC
  Administered 2018-10-17 – 2018-10-18 (×4): via INTRAVENOUS

## 2018-10-17 NOTE — ED Notes (Signed)
EDP at bedside updating patient and family. 

## 2018-10-17 NOTE — ED Notes (Signed)
Patient and family informed about NPO status.

## 2018-10-17 NOTE — ED Triage Notes (Addendum)
Pt c/o abd pain with increase in stools as well as n/v that started yesterday, pt had tried gas x and phenergan at home with  No relief of symptoms,

## 2018-10-17 NOTE — ED Notes (Signed)
Pt given ice chips per MD approval. 

## 2018-10-17 NOTE — Progress Notes (Signed)
Pharmacy Antibiotic Note  Alexandra Kelley is a 37 y.o. female admitted on 10/17/2018 with UTI.  Pharmacy has been consulted for Levaquin dosing.  Plan: Levaquin 500 mg IV every 24 hours. Monitor labs, c/s, and patient improvement.  Weight: (!) 314 lb 13.1 oz (142.8 kg)  Temp (24hrs), Avg:99.7 F (37.6 C), Min:99.4 F (37.4 C), Max:99.9 F (37.7 C)  Recent Labs  Lab 10/17/18 0527 10/17/18 1322 10/17/18 1334  WBC 12.9*  --   --   CREATININE 0.88  --   --   LATICACIDVEN  --  2.2* 2.47*    Estimated Creatinine Clearance: 115.1 mL/min (by C-G formula based on SCr of 0.88 mg/dL).    Allergies  Allergen Reactions  . Ceclor [Cefaclor]     Rash  . Invokana [Canagliflozin]     Severe yeast infections  . Lyrica [Pregabalin] Itching  . Metformin And Related     GI distress  . Onglyza [Saxagliptin]     headaches  . Zofran [Ondansetron Hcl] Other (See Comments)    Itchy rash in mouth  . Penicillins Rash    Has patient had a PCN reaction causing immediate rash, facial/tongue/throat swelling, SOB or lightheadedness with hypotension: No Has patient had a PCN reaction causing severe rash involving mucus membranes or skin necrosis: No Has patient had a PCN reaction that required hospitalization: No Has patient had a PCN reaction occurring within the last 10 years: Yes If all of the above answers are "NO", then may proceed with Cephalosporin use.     Antimicrobials this admission: Levaquin 12/16 >>      Dose adjustments this admission: N/A  Microbiology results: 12/16 BCx: pending 12/16 UCx: pending  Thank you for allowing pharmacy to be a part of this patient's care.  Tad MooreSteven C Florrie Ramires 10/17/2018 1:56 PM

## 2018-10-17 NOTE — ED Provider Notes (Signed)
[facility] EMERGENCY DEPARTMENT Provider Note   CSN: [id number] Arrival date & time: 10/17/18  0449  Time seen 6:18 AM (patient was not in room at 6:05 AM)   History   Chief Complaint Chief Complaint  Patient presents with  . Abdominal Pain    HPI Alexandra Kelley is a 37 y.o. female.  HPI patient has the appearance of Down syndrome.  She lives of her mother.  Mother reports at 23 PM last night she started complaining of epigastric abdominal pain.  She states the pain is sharp and constant.  It does not radiate into her back.  She has had nausea and vomiting and a lot of spitting of clear fluids.  She denies cough.  She has not having diarrhea but states her stools are mildly loose.  No fever, no coughing.  She has not been around anybody else who is sick.  When asked about food mother states she ate a hot dog which she is yesterday evening and sometimes the cheese can upset her stomach similar to this.  She states however usually the symptoms do not last this long.  Mother gave her a Phenergan pill and Gas-X without relief.  Patient denies any burning in her throat when she has the vomiting.  PCP Marylynn Pearson, FNP  Past Medical History:  Diagnosis Date  . Carpal tunnel syndrome    both wrists  . Diabetes mellitus without complication (HCC)    Dr. Lurene Shadow  . GERD (gastroesophageal reflux disease)   . Kidney stone   . Neuropathy   . Thyroid disease     Patient Active Problem List   Diagnosis Date Noted  . Pap smear, low-risk 09/07/2018  . Irregular bleeding 09/07/2018  . Diabetic lumbosacral plexopathy (HCC) 05/10/2014  . Abnormality of gait 03/29/2014  . Muscle weakness (generalized) 11/20/2013  . Pain, joint, lower leg, left 11/20/2013  . Difficulty in walking(719.7) 11/20/2013  . Femur fracture, left (HCC) 10/09/2013  . Acute urinary retention 10/04/2013  . Acute-on-chronic respiratory failure (HCC) 10/04/2013  . Femur fracture (HCC) 10/03/2013  . Closed  displaced oblique fracture of shaft of left femur (HCC) 10/03/2013  . Hypothyroidism 10/03/2013  . Diabetes (HCC) 10/03/2013    Past Surgical History:  Procedure Laterality Date  . BACK SURGERY    . FEMUR IM NAIL Left 10/03/2013   Procedure: INTRAMEDULLARY (IM) RETROGRADE FEMORAL NAILING;  Surgeon: Eldred Manges, MD;  Location: [facility] OR;  Service: Orthopedics;  Laterality: Left;  . tubes in ears       OB History    Gravida  0   Para  0   Term  0   Preterm  0   AB  0   Living  0     SAB  0   TAB  0   Ectopic  0   Multiple  0   Live Births  0            Home Medications    Prior to Admission medications   Medication Sig Start Date End Date Taking? Authorizing Provider  gabapentin (NEURONTIN) 300 MG capsule Take 300 mg by mouth 3 (three) times daily. 03/27/16  Yes [provider]  glimepiride (AMARYL) 4 MG tablet Take 4 mg by mouth 2 (two) times daily.    Yes [provider]  levothyroxine (SYNTHROID, LEVOTHROID) 200 MCG tablet Take 200 mcg by mouth daily before breakfast.   Yes [provider]  medroxyPROGESTERone (PROVERA) 10 MG tablet Take 1  daily for 10 days,first 10 days of December,march,june,september and December 09/22/18  Yes Cyril Mourning A, NP  nortriptyline (PAMELOR) 25 MG capsule Take 1-2 tablets at bedtime. Patient taking differently: Take 25 mg by mouth at bedtime. Take 1-2 tablets at bedtime. 06/10/16  Yes Levert Feinstein, MD  traMADol (ULTRAM) 50 MG tablet TAKE 1 TABLET BY MOUTH EVERY 12 HOURS AS NEEDED Patient taking differently: Take 50 mg by mouth 2 (two) times daily.  08/03/17  Yes Levert Feinstein, MD    Family History Family History  Problem Relation Age of Onset  . Thyroid disease Mother   . Kidney Stones Father   . Heart attack Paternal Grandmother   . Thyroid disease Maternal Grandmother   . Other Maternal Grandmother        heart issue  . Heart attack Maternal Grandfather   . Diabetes Maternal Grandfather   . Colon  cancer Maternal Grandfather   . Cancer Maternal Grandfather        prostate    Social History Social History   Tobacco Use  . Smoking status: Never Smoker  . Smokeless tobacco: Never Used  Substance Use Topics  . Alcohol use: No  . Drug use: No  lives with mother   Allergies   Ceclor [cefaclor]; Invokana [canagliflozin]; Lyrica [pregabalin]; Metformin and related; Onglyza [saxagliptin]; Zofran [ondansetron hcl]; and Penicillins   Review of Systems Review of Systems  All other systems reviewed and are negative.    Physical Exam Updated Vital Signs BP 115/67   Pulse (!) 112   Temp 99.4 F (37.4 C) (Oral)   Resp 16   Wt (!) 142.8 kg   SpO2 91%   BMI 65.80 kg/m   Physical Exam Vitals signs and nursing note reviewed.  Constitutional:      General: She is not in acute distress.    Appearance: Normal appearance. She is well-developed. She is obese. She is not ill-appearing or toxic-appearing.     Comments: Patient has the typical Down's faces  HENT:     Head: Normocephalic and atraumatic.     Right Ear: External ear normal.     Left Ear: External ear normal.     Nose: Nose normal. No mucosal edema or rhinorrhea.     Mouth/Throat:     Mouth: Mucous membranes are dry.     Dentition: No dental abscesses.     Pharynx: No uvula swelling.  Eyes:     Conjunctiva/sclera: Conjunctivae normal.     Pupils: Pupils are equal, round, and reactive to light.  Neck:     Musculoskeletal: Full passive range of motion without pain, normal range of motion and neck supple.  Cardiovascular:     Rate and Rhythm: Regular rhythm. Tachycardia present.     Heart sounds: Normal heart sounds. No murmur. No friction rub. No gallop.   Pulmonary:     Effort: Pulmonary effort is normal. No respiratory distress.     Breath sounds: Normal breath sounds. No wheezing, rhonchi or rales.  Chest:     Chest wall: No tenderness or crepitus.  Abdominal:     General: Bowel sounds are normal. There is  no distension.     Palpations: Abdomen is soft.     Tenderness: There is abdominal tenderness. There is no guarding or rebound.       Comments: Patient is tender diffusely but she states it is worse in the epigastric area  Musculoskeletal: Normal range of motion.  General: No tenderness.     Comments: Moves all extremities well.   Skin:    General: Skin is warm and dry.     Coloration: Skin is not pale.     Findings: No erythema or rash.  Neurological:     Mental Status: She is alert and oriented to person, place, and time.     Cranial Nerves: No cranial nerve deficit.     Comments: Seems mentally slow  Psychiatric:        Mood and Affect: Mood is not anxious.        Speech: Speech normal.        Behavior: Behavior normal.      ED Treatments / Results  Labs (all labs ordered are listed, but only abnormal results are displayed) Results for orders placed or performed during the hospital encounter of 10/17/18  Lipase, blood  Result Value Ref Range   Lipase 49 11 - 51 U/L  Comprehensive metabolic panel  Result Value Ref Range   Sodium 135 135 - 145 mmol/L   Potassium 4.3 3.5 - 5.1 mmol/L   Chloride 94 (L) 98 - 111 mmol/L   CO2 27 22 - 32 mmol/L   Glucose, Bld 371 (H) 70 - 99 mg/dL   BUN 9 6 - 20 mg/dL   Creatinine, Ser 2.44 0.44 - 1.00 mg/dL   Calcium 01.0 8.9 - 27.2 mg/dL   Total Protein 9.6 (H) 6.5 - 8.1 g/dL   Albumin 4.7 3.5 - 5.0 g/dL   AST 30 15 - 41 U/L   ALT 26 0 - 44 U/L   Alkaline Phosphatase 85 38 - 126 U/L   Total Bilirubin 1.0 0.3 - 1.2 mg/dL   GFR calc non Af Amer >60 >60 mL/min   GFR calc Af Amer >60 >60 mL/min   Anion gap 14 5 - 15  CBC  Result Value Ref Range   WBC 12.9 (H) 4.0 - 10.5 K/uL   RBC 5.38 (H) 3.87 - 5.11 MIL/uL   Hemoglobin 14.8 12.0 - 15.0 g/dL   HCT 53.6 (H) 64.4 - 03.4 %   MCV 87.2 80.0 - 100.0 fL   MCH 27.5 26.0 - 34.0 pg   MCHC 31.6 30.0 - 36.0 g/dL   RDW 74.2 59.5 - 63.8 %   Platelets 273 150 - 400 K/uL   nRBC 0.0 0.0  - 0.2 %  Urinalysis, Routine w reflex microscopic  Result Value Ref Range   Color, Urine YELLOW YELLOW   APPearance CLEAR CLEAR   Specific Gravity, Urine 1.023 1.005 - 1.030   pH 7.0 5.0 - 8.0   Glucose, UA >=500 (A) NEGATIVE mg/dL   Hgb urine dipstick NEGATIVE NEGATIVE   Bilirubin Urine NEGATIVE NEGATIVE   Ketones, ur 20 (A) NEGATIVE mg/dL   Protein, ur NEGATIVE NEGATIVE mg/dL   Nitrite NEGATIVE NEGATIVE   Leukocytes, UA TRACE (A) NEGATIVE   RBC / HPF 0-5 0 - 5 RBC/hpf   WBC, UA 6-10 0 - 5 WBC/hpf   Bacteria, UA NONE SEEN NONE SEEN   Squamous Epithelial / LPF 0-5 0 - 5   Hyaline Casts, UA PRESENT   I-Stat beta hCG blood, ED  Result Value Ref Range   I-stat hCG, quantitative <5.0 <5 mIU/mL   Comment 3           Laboratory interpretation all normal except leukocytosis    EKG None  Radiology No results found.  Procedures Procedures (including critical care time)  Medications Ordered  in ED Medications  sodium chloride 0.9 % bolus 1,000 mL (0 mLs Intravenous Stopped 10/17/18 0743)  sodium chloride 0.9 % bolus 500 mL (0 mLs Intravenous Stopped 10/17/18 0743)  metoCLOPramide (REGLAN) injection 10 mg (10 mg Intravenous Given 10/17/18 0648)  diphenhydrAMINE (BENADRYL) injection 25 mg (25 mg Intravenous Given 10/17/18 0648)  fentaNYL (SUBLIMAZE) injection 50 mcg (50 mcg Intravenous Given 10/17/18 0743)  iopamidol (ISOVUE-300) 61 % injection 100 mL (100 mLs Intravenous Contrast Given 10/17/18 [id number])     Initial Impression / Assessment and Plan / ED Course  I have reviewed the triage vital signs and the nursing notes.  Pertinent labs & imaging results that were available during my care of the patient were reviewed by me and considered in my medical decision making (see chart for details).   Patient is allergic to Zofran, she was given Reglan and Benadryl for her nausea.  She was given IV fluids.  Her laboratory test results are normal except for mild leukocytosis which can  also happen with vomiting.  I will give her IV fluids and see how she does with the medication, if she continues to have pain or vomiting will proceed with CT scan.  Recheck at 7:10 AM patient is sitting in a chair at the bedside, she states her abdominal pain is getting worse.  She was given IV fentanyl for pain, and discussed with mother about getting a CT scan.  He 20 5 AM patient was turned over to Dr. Jodi MourningZavitz to get the results of her CT scan.  Final Clinical Impressions(s) / ED Diagnoses   Final diagnoses:  Generalized abdominal pain  Nausea and vomiting, intractability of vomiting not specified, unspecified vomiting type    Disposition pending  Devoria AlbeIva Beckham Capistran, MD, Concha PyoFACEP    Altovise Wahler, MD 10/17/18 [phone]

## 2018-10-17 NOTE — ED Notes (Signed)
Po Fluids provided.

## 2018-10-17 NOTE — ED Provider Notes (Signed)
Patient's care was signed out to follow-up CT abdomen pelvis results.  Patient came with abdominal pain that was worsening in the ER and vomiting.  Patient's blood work reviewed with mild light leukocytosis, CT abdomen pelvis no acute findings.  Remains tachycardic, plan for pain meds as needed and IV fluids.  Oxygen trending lower chest x-ray added.  Chest x-ray reviewed no acute findings.  Patient denies any shortness of breath or cough.  Patient's had recurrent diarrhea and vomiting, with heart rate elevated and signs of dehydration plan for observation.  Paged hospitalist to assist.  Kenton KingfisherJoshua M Kashvi Prevette      Nyasia Baxley, MD 10/17/18 [phone]

## 2018-10-17 NOTE — ED Notes (Signed)
Patient transported to X-ray 

## 2018-10-17 NOTE — Discharge Instructions (Signed)
Use zofran for vomiting.  If you were given medicines take as directed.  If you are on coumadin or contraceptives realize their levels and effectiveness is altered by many different medicines.  If you have any reaction (rash, tongues swelling, other) to the medicines stop taking and see a physician.    If your blood pressure was elevated in the ER make sure you follow up for management with a primary doctor or return for chest pain, shortness of breath or stroke symptoms.  Please follow up as directed and return to the ER or see a physician for new or worsening symptoms.  Thank you. Vitals:   10/17/18 0600 10/17/18 0630 10/17/18 0700 10/17/18 0730  BP: 115/65 120/64 113/69 115/67  Pulse: (!) 120 (!) 118 (!) 112 (!) 112  Resp:    16  Temp:      TempSrc:      SpO2: 94% 93% 92% 91%  Weight:

## 2018-10-17 NOTE — H&P (Signed)
History and Physical  Alexandra Kelley ZOX:[id number] DOB: Dec 18, 1980 DOA: 10/17/2018  Referring physician: Jodi Mourning, MD PCP: Marylynn Pearson, FNP   Chief Complaint: Abdominal pain   HPI: Alexandra Kelley is a 37 y.o. female with Down's syndrome was brought to ED by mother with complaints of epigastric abdominal pain.  She denies that she has been having constipation.  She says that she has been having a sharp constant pain in the epigastric area that radiates into the back.  She has also had some nausea and vomiting.  Her blood sugars have been very high mostly over 300.  No diarrhea.  No fever.  No upper respiratory symptoms.  No known sick contacts.  The patient has tried some over-the-counter medications without relief.  She had tried [place] and an old Phenergan tablet that she had at home.  She has had some dysuria but it has been fairly mild and mother reports that she has had reduced urine output over the past 24 hours.  ED course: The patient was evaluated in the ED and noted to be clinically dehydrated with a sinus tachycardia heart rate in the 120 range.  She was immediately treated with IV fluid hydration.  She has received several boluses of IV fluids with some improvement in symptoms however she continued to complain of abdominal pain.  Chest x-ray with no acute findings.  Urinalysis positive for 6-10 white blood cells.  Ketones and glucose noted in the urine.  Blood sugar was 371.  WBC 12.9.  CT of the abdomen with no acute findings.  Lactic acid was elevated at 2.47.  Because the patient remains symptomatic after several liters of fluid in the ED admission was requested for observation and further management.  Review of Systems: All systems reviewed and apart from history of presenting illness, are negative.  Past Medical History:  Diagnosis Date  . Carpal tunnel syndrome    both wrists  . Diabetes mellitus without complication (HCC)    Dr. Lurene Shadow  . GERD (gastroesophageal  reflux disease)   . Kidney stone   . Neuropathy   . Thyroid disease    Past Surgical History:  Procedure Laterality Date  . BACK SURGERY    . FEMUR IM NAIL Left 10/03/2013   Procedure: INTRAMEDULLARY (IM) RETROGRADE FEMORAL NAILING;  Surgeon: Eldred Manges, MD;  Location: [facility] OR;  Service: Orthopedics;  Laterality: Left;  . tubes in ears     Social History:  reports that she has never smoked. She has never used smokeless tobacco. She reports that she does not drink alcohol or use drugs.  Allergies  Allergen Reactions  . Ceclor [Cefaclor]     Rash  . Invokana [Canagliflozin]     Severe yeast infections  . Lyrica [Pregabalin] Itching  . Metformin And Related     GI distress  . Onglyza [Saxagliptin]     headaches  . Zofran [Ondansetron Hcl] Other (See Comments)    Itchy rash in mouth  . Penicillins Rash    Has patient had a PCN reaction causing immediate rash, facial/tongue/throat swelling, SOB or lightheadedness with hypotension: No Has patient had a PCN reaction causing severe rash involving mucus membranes or skin necrosis: No Has patient had a PCN reaction that required hospitalization: No Has patient had a PCN reaction occurring within the last 10 years: Yes If all of the above answers are "NO", then may proceed with Cephalosporin use.     Family History  Problem Relation Age of Onset  .  Thyroid disease Mother   . Kidney Stones Father   . Heart attack Paternal Grandmother   . Thyroid disease Maternal Grandmother   . Other Maternal Grandmother        heart issue  . Heart attack Maternal Grandfather   . Diabetes Maternal Grandfather   . Colon cancer Maternal Grandfather   . Cancer Maternal Grandfather        prostate    Prior to Admission medications   Medication Sig Start Date End Date Taking? Authorizing Provider  gabapentin (NEURONTIN) 300 MG capsule Take 300 mg by mouth 3 (three) times daily. 03/27/16  Yes [provider]  glimepiride (AMARYL) 4 MG  tablet Take 4 mg by mouth 2 (two) times daily.    Yes [provider]  levothyroxine (SYNTHROID, LEVOTHROID) 200 MCG tablet Take 200 mcg by mouth daily before breakfast.   Yes [provider]  nortriptyline (PAMELOR) 25 MG capsule Take 1-2 tablets at bedtime. Patient taking differently: Take 25 mg by mouth at bedtime. Take 1-2 tablets at bedtime. 06/10/16  Yes Levert Feinstein, MD  traMADol (ULTRAM) 50 MG tablet TAKE 1 TABLET BY MOUTH EVERY 12 HOURS AS NEEDED Patient taking differently: Take 50 mg by mouth 2 (two) times daily.  08/03/17  Yes Levert Feinstein, MD   Physical Exam: Vitals:   10/17/18 0800 10/17/18 0913 10/17/18 0930 10/17/18 1123  BP: 123/72  122/65 109/70  Pulse: (!) 115  (!) 111 (!) 109  Resp:   15 18  Temp:  99.9 F (37.7 C)    TempSrc:  Rectal    SpO2: (!) 89%  98% 96%  Weight:         General exam: Moderately built and nourished patient, lying comfortably supine on the gurney in no obvious distress.  Patient is morbidly obese.  Head, eyes and ENT: Nontraumatic and normocephalic. Pupils equally reacting to light and accommodation. Oral mucosa dry.  Neck: Supple. No JVD, carotid bruit or thyromegaly.  Lymphatics: No lymphadenopathy.  Respiratory system: Clear to auscultation. No increased work of breathing.  Cardiovascular system: S1 and S2 heard, tachycardic. No JVD, murmurs, gallops, clicks or pedal edema.  Gastrointestinal system: Abdomen is nondistended, soft and diffuse epigastric tenderness with light palpation mild suprapubic tenderness with palpation.. Normal bowel sounds heard. No organomegaly or masses appreciated.  Central nervous system: Alert and oriented. No focal neurological deficits.  Extremities: Symmetric 5 x 5 power. Peripheral pulses symmetrically felt.   Skin: No rashes or acute findings.  Musculoskeletal system: Negative exam.  Psychiatry: Pleasant and cooperative.  Labs on Admission:  Basic Metabolic Panel: Recent Labs  Lab  10/17/18 0527  NA 135  K 4.3  CL 94*  CO2 27  GLUCOSE 371*  BUN 9  CREATININE 0.88  CALCIUM 10.2   Liver Function Tests: Recent Labs  Lab 10/17/18 0527  AST 30  ALT 26  ALKPHOS 85  BILITOT 1.0  PROT 9.6*  ALBUMIN 4.7   Recent Labs  Lab 10/17/18 0527  LIPASE 49   No results for input(s): AMMONIA in the last 168 hours. CBC: Recent Labs  Lab 10/17/18 0527  WBC 12.9*  HGB 14.8  HCT 46.9*  MCV 87.2  PLT 273   Cardiac Enzymes: No results for input(s): CKTOTAL, CKMB, CKMBINDEX, TROPONINI in the last 168 hours.  BNP (last 3 results) No results for input(s): PROBNP in the last 8760 hours. CBG: No results for input(s): GLUCAP in the last 168 hours.  Radiological Exams on Admission: Dg Chest  2 View  Result Date: 10/17/2018 CLINICAL DATA:  Mid chest pain and tachycardia. EXAM: CHEST - 2 VIEW COMPARISON:  10/04/2013 FINDINGS: The heart size and mediastinal contours are within normal limits. Both lungs are clear. The visualized skeletal structures are unremarkable. IMPRESSION: No active cardiopulmonary disease. Electronically Signed   By: Paulina FusiMark  Shogry M.D.   On: 10/17/2018 09:53   Ct Abdomen Pelvis W Contrast  Result Date: 10/17/2018 CLINICAL DATA:  Acute epigastric abdominal pain. EXAM: CT ABDOMEN AND PELVIS WITH CONTRAST TECHNIQUE: Multidetector CT imaging of the abdomen and pelvis was performed using the standard protocol following bolus administration of intravenous contrast. CONTRAST:  100mL ISOVUE-300 IOPAMIDOL (ISOVUE-300) INJECTION 61% COMPARISON:  CT scan of May 02, 2017. FINDINGS: Lower chest: No acute abnormality. Hepatobiliary: No focal liver abnormality is seen. No gallstones, gallbladder wall thickening, or biliary dilatation. Pancreas: Unremarkable. No pancreatic ductal dilatation or surrounding inflammatory changes. Spleen: Normal in size without focal abnormality. Adrenals/Urinary Tract: Adrenal glands appear normal. Bilateral nephrolithiasis is noted. No  hydronephrosis or renal obstruction is noted. No ureteral calculi are noted. Urinary bladder is unremarkable. Stomach/Bowel: Stomach is within normal limits. Appendix appears normal. No evidence of bowel wall thickening, distention, or inflammatory changes. Vascular/Lymphatic: Aortic atherosclerosis. No enlarged abdominal or pelvic lymph nodes. Reproductive: Uterus and bilateral adnexa are unremarkable. Other: Moderate size fat containing periumbilical hernia is noted. No ascites is noted. Musculoskeletal: No acute or significant osseous findings. IMPRESSION: Bilateral nonobstructive nephrolithiasis. Moderate size fat containing periumbilical hernia. Aortic Atherosclerosis (ICD10-I70.0). Electronically Signed   By: Lupita RaiderJames  Green Jr, M.D.   On: 10/17/2018 08:54   EKG: Independently reviewed.  Sinus tachycardia.  Assessment/Plan Principal Problem:   Dehydration Active Problems:   Hypothyroidism   Difficulty walking   Diabetic lumbosacral plexopathy (HCC)   Sinus tachycardia   Hyperglycemia   Uncontrolled diabetes mellitus (HCC)  1. SIRS - Pt has tachycardia, elevated lactic acid and will be admitted for further observation.  Pt will be further evaluated for clear source of infection.  Bolus IVFs and follow lactate.  Check blood cultures.  2. Severe dehydration -I suspect with her poorly controlled diabetes mellitus that she has been frequently urinating and has become severely dehydrated likely because of that in association with a UTI.  Treating with IV fluid hydration.  Monitor electrolytes. 3. Elevated lactic acid level-this could be from her severe dehydration however we are treating with bolus IV fluids and will follow lactic acid levels. 4. Uncontrolled diabetes mellitus with hyperglycemia-holding oral home medications.  Ordered some basal and prandial insulin and supplemental sliding scale insulin.  Check hemoglobin A1c. 5. Sinus tachycardia-suspect secondary to dehydration-treating  supportively with IV fluids. 6. UTI - urine culture pending, IV levofloxacin ordered.   7. Hypothyroidism - resume home levothyroxine.  Follow TSH.   DVT Prophylaxis: lovenox  Code Status: Full   Family Communication: mother at bedside   Disposition Plan: home when medically stabilized   Time spent: 58 minutes   Standley Dakinslanford Conchetta Lamia, MD [facility] Pager [phone]  If 7PM-7AM, please contact night-coverage [email or web address] Password [email or web address] 10/17/2018, 1:03 PM

## 2018-10-17 NOTE — ED Notes (Signed)
CRITICAL VALUE ALERT  Critical Value:  I-stat lac  Date & Time Notied:  10/17/2018 1338  Provider Notified: Dr. Laural BenesJohnson  Orders Received/Actions taken: none

## 2018-10-17 NOTE — Progress Notes (Addendum)
CRITICAL VALUE ALERT  Critical Value:  Lactic acid 2.9  Date & Time Notied:  10/17/18, 1955  Provider Notified: Schorr  Orders Received/Actions taken: 1000 ml bolus

## 2018-10-17 NOTE — ED Notes (Addendum)
CRITICAL VALUE ALERT  Critical Value:  Lactic Acid 2.2  Date & Time Notied:  10/17/18 1352  Provider Notified: Dr. Laural BenesJohnson  Orders Received/Actions taken: MD and pt's primary RN notified, MD entered orders for fluid bolus.

## 2018-10-18 DIAGNOSIS — R1084 Generalized abdominal pain: Secondary | ICD-10-CM | POA: Diagnosis not present

## 2018-10-18 DIAGNOSIS — E860 Dehydration: Secondary | ICD-10-CM | POA: Diagnosis not present

## 2018-10-18 DIAGNOSIS — R739 Hyperglycemia, unspecified: Secondary | ICD-10-CM | POA: Diagnosis not present

## 2018-10-18 DIAGNOSIS — E039 Hypothyroidism, unspecified: Secondary | ICD-10-CM | POA: Diagnosis not present

## 2018-10-18 DIAGNOSIS — R112 Nausea with vomiting, unspecified: Secondary | ICD-10-CM

## 2018-10-18 LAB — COMPREHENSIVE METABOLIC PANEL
ALT: 19 U/L (ref 0–44)
AST: 18 U/L (ref 15–41)
Albumin: 3.2 g/dL — ABNORMAL LOW (ref 3.5–5.0)
Alkaline Phosphatase: 56 U/L (ref 38–126)
Anion gap: 6 (ref 5–15)
BUN: 5 mg/dL — ABNORMAL LOW (ref 6–20)
CALCIUM: 8.4 mg/dL — AB (ref 8.9–10.3)
CO2: 26 mmol/L (ref 22–32)
Chloride: 105 mmol/L (ref 98–111)
Creatinine, Ser: 0.68 mg/dL (ref 0.44–1.00)
Glucose, Bld: 212 mg/dL — ABNORMAL HIGH (ref 70–99)
Potassium: 3.6 mmol/L (ref 3.5–5.1)
Sodium: 137 mmol/L (ref 135–145)
Total Bilirubin: 0.9 mg/dL (ref 0.3–1.2)
Total Protein: 6.8 g/dL (ref 6.5–8.1)

## 2018-10-18 LAB — CBC WITH DIFFERENTIAL/PLATELET
Abs Immature Granulocytes: 0.04 10*3/uL (ref 0.00–0.07)
Basophils Absolute: 0 10*3/uL (ref 0.0–0.1)
Basophils Relative: 0 %
EOS PCT: 1 %
Eosinophils Absolute: 0.1 10*3/uL (ref 0.0–0.5)
HCT: 40.3 % (ref 36.0–46.0)
Hemoglobin: 12.5 g/dL (ref 12.0–15.0)
Immature Granulocytes: 1 %
Lymphocytes Relative: 29 %
Lymphs Abs: 2.2 10*3/uL (ref 0.7–4.0)
MCH: 27.6 pg (ref 26.0–34.0)
MCHC: 31 g/dL (ref 30.0–36.0)
MCV: 89 fL (ref 80.0–100.0)
Monocytes Absolute: 0.6 10*3/uL (ref 0.1–1.0)
Monocytes Relative: 8 %
Neutro Abs: 4.7 10*3/uL (ref 1.7–7.7)
Neutrophils Relative %: 61 %
Platelets: 233 10*3/uL (ref 150–400)
RBC: 4.53 MIL/uL (ref 3.87–5.11)
RDW: 14.5 % (ref 11.5–15.5)
WBC: 7.7 10*3/uL (ref 4.0–10.5)
nRBC: 0 % (ref 0.0–0.2)

## 2018-10-18 LAB — GLUCOSE, CAPILLARY
Glucose-Capillary: 165 mg/dL — ABNORMAL HIGH (ref 70–99)
Glucose-Capillary: 197 mg/dL — ABNORMAL HIGH (ref 70–99)
Glucose-Capillary: 258 mg/dL — ABNORMAL HIGH (ref 70–99)
Glucose-Capillary: 160 mg/dL — ABNORMAL HIGH (ref 70–99)

## 2018-10-18 LAB — MAGNESIUM: Magnesium: 1.8 mg/dL (ref 1.7–2.4)

## 2018-10-18 LAB — HIV ANTIBODY (ROUTINE TESTING W REFLEX): HIV Screen 4th Generation wRfx: NONREACTIVE

## 2018-10-18 MED ORDER — PROMETHAZINE HCL 12.5 MG PO TABS: 12.5000 mg | ORAL_TABLET | Freq: Four times a day (QID) | ORAL | 0 refills | Status: DC | PRN

## 2018-10-18 MED ORDER — DICYCLOMINE HCL 10 MG PO CAPS
10.0000 mg | ORAL_CAPSULE | Freq: Three times a day (TID) | ORAL | Status: DC
  Administered 2018-10-18 (×2): 10 mg via ORAL
  Filled 2018-10-18: qty 1

## 2018-10-18 MED ORDER — ALUM & MAG HYDROXIDE-SIMETH 200-200-20 MG/5ML PO SUSP
30.0000 mL | Freq: Three times a day (TID) | ORAL | Status: DC
  Administered 2018-10-18: 30 mL via ORAL
  Filled 2018-10-18: qty 30

## 2018-10-18 MED ORDER — PANTOPRAZOLE SODIUM 40 MG PO TBEC: 40.0000 mg | DELAYED_RELEASE_TABLET | Freq: Every day | ORAL | 1 refills | Status: DC

## 2018-10-18 MED ORDER — DICYCLOMINE HCL 10 MG PO CAPS
10.0000 mg | ORAL_CAPSULE | Freq: Three times a day (TID) | ORAL | Status: DC
  Filled 2018-10-18: qty 1

## 2018-10-18 MED ORDER — LIDOCAINE VISCOUS HCL 2 % MT SOLN
15.0000 mL | Freq: Three times a day (TID) | OROMUCOSAL | Status: DC
  Administered 2018-10-18: 15 mL via ORAL
  Filled 2018-10-18: qty 15

## 2018-10-18 MED ORDER — DICYCLOMINE HCL 10 MG PO CAPS: 10.0000 mg | ORAL_CAPSULE | Freq: Three times a day (TID) | ORAL | 0 refills | Status: AC | PRN

## 2018-10-18 MED ORDER — GLIMEPIRIDE 2 MG PO TABS
4.0000 mg | ORAL_TABLET | Freq: Two times a day (BID) | ORAL | Status: DC
  Administered 2018-10-18: 4 mg via ORAL
  Filled 2018-10-18: qty 2

## 2018-10-18 MED ORDER — ALUM & MAG HYDROXIDE-SIMETH 200-200-20 MG/5ML PO SUSP: 30.0000 mL | Freq: Four times a day (QID) | ORAL | 0 refills | Status: DC | PRN

## 2018-10-18 NOTE — Progress Notes (Signed)
Patient tolerated dinner well with no complaints. States abdominal cramping relieved with bentyl as ordered. No reports of further loose stools. She and mother agree she'd like to go home this evening. Notified MD. Orders placed for discharge home. Earnstine RegalAshley Bufford Helms, RN

## 2018-10-18 NOTE — Progress Notes (Signed)
Discharge instructions reviewed with patient and her mother Tammy Franze. Given AVS and prescriptions sent to [facility] by MD. Verbalized understanding of instructions, follow-up and when to seek medical attention if symptoms return. Verbalized understanding of importance of staying hydrated. IV site removed by nurse tech, site within normal limits. Pt in stable condition awaiting w/c and nurse tech assist off unit for discharge home. Earnstine RegalAshley Jacory Kamel, RN

## 2018-10-18 NOTE — Discharge Summary (Signed)
Physician Discharge Summary  Alexandra MerlSabrina A Kelley RUE:[id number] DOB: Mar 04, 1981 DOA: 10/17/2018  PCP: Alexandra PearsonMcCorkle, Tenika, FNP  Admit date: 10/17/2018 Discharge date: 10/18/2018  Admitted From: Home Disposition: Home  Recommendations for Outpatient Follow-up:  1. Follow up with PCP in 1-2 weeks 2. Please obtain BMP/CBC in one week  Discharge Condition: Stable CODE STATUS: Full code Diet recommendation: Heart healthy, carb modified  Brief/Interim Summary: 37 year old female with a history of Down syndrome who lives with her mother, presents to the hospital with complaints of abdominal discomfort.  She was having significant epigastric pain, vomiting and abdominal cramping.  She not had any diarrhea.  She was noted to have elevated blood sugars and was clinically dehydrated.  She had elevated lactic acid.  Her labs improved with IV hydration.  Lactic acid had improved as had blood sugars.  It is likely that she had a gastroenteritis leading to vomiting.  Her epigastric discomfort improved with Maalox.  Abdominal cramping after eating also improved with Bentyl.  Diet was advanced and she is tolerating a solid diet.  She has been adequately hydrated with IV fluids.  She is been encouraged to continue with oral hydration.  Blood sugars have improved and she is been restarted on home dose of glimepiride.  A1c was noted to be around 7 which indicates fair control.  This can be further adjusted as an outpatient.  She had a CT scan of her abdomen pelvis that did not show any acute findings.  Patient is feeling significantly better and feels ready to discharge home.  Her mother is also in agreement of this.  Discharge Diagnoses:  Principal Problem:   Dehydration Active Problems:   Hypothyroidism   Difficulty walking   Diabetic lumbosacral plexopathy (HCC)   Sinus tachycardia   Hyperglycemia   Uncontrolled diabetes mellitus [facility](HCC)    Discharge Instructions  Discharge Instructions    Diet - low  sodium heart healthy   Complete by:  As directed    Increase activity slowly   Complete by:  As directed      Allergies as of 10/18/2018      Reactions   Ceclor [cefaclor]    Rash   Invokana [canagliflozin]    Severe yeast infections   Lyrica [pregabalin] Itching   Metformin And Related    GI distress   Onglyza [saxagliptin]    headaches   Zofran [ondansetron Hcl] Other (See Comments)   Itchy rash in mouth   Penicillins Rash   Has patient had a PCN reaction causing immediate rash, facial/tongue/throat swelling, SOB or lightheadedness with hypotension: No Has patient had a PCN reaction causing severe rash involving mucus membranes or skin necrosis: No Has patient had a PCN reaction that required hospitalization: No Has patient had a PCN reaction occurring within the last 10 years: Yes If all of the above answers are "NO", then may proceed with Cephalosporin use.      Medication List    TAKE these medications   alum & mag hydroxide-simeth 200-200-20 MG/5ML suspension Commonly known as:  MAALOX/MYLANTA Take 30 mLs by mouth every 6 (six) hours as needed for indigestion or heartburn.   dicyclomine 10 MG capsule Commonly known as:  BENTYL Take 1 capsule (10 mg total) by mouth 3 (three) times daily as needed for spasms.   gabapentin 300 MG capsule Commonly known as:  NEURONTIN Take 300 mg by mouth 3 (three) times daily.   glimepiride 4 MG tablet Commonly known as:  AMARYL Take 4 mg by mouth  2 (two) times daily.   levothyroxine 200 MCG tablet Commonly known as:  SYNTHROID, LEVOTHROID Take 200 mcg by mouth daily before breakfast.   nortriptyline 25 MG capsule Commonly known as:  PAMELOR Take 1-2 tablets at bedtime. What changed:    how much to take  how to take this  when to take this   pantoprazole 40 MG tablet Commonly known as:  PROTONIX Take 1 tablet (40 mg total) by mouth daily.   promethazine 12.5 MG tablet Commonly known as:  PHENERGAN Take 1 tablet  (12.5 mg total) by mouth every 6 (six) hours as needed for nausea.   traMADol 50 MG tablet Commonly known as:  ULTRAM TAKE 1 TABLET BY MOUTH EVERY 12 HOURS AS NEEDED What changed:  when to take this      Follow-up Information    Schedule an appointment as soon as possible for a visit  with Alexandra Pearson, FNP.   Specialty:  Family Medicine Contact information: [address] [place] [address] [phone]          Allergies  Allergen Reactions  . Ceclor [Cefaclor]     Rash  . Invokana [Canagliflozin]     Severe yeast infections  . Lyrica [Pregabalin] Itching  . Metformin And Related     GI distress  . Onglyza [Saxagliptin]     headaches  . Zofran [Ondansetron Hcl] Other (See Comments)    Itchy rash in mouth  . Penicillins Rash    Has patient had a PCN reaction causing immediate rash, facial/tongue/throat swelling, SOB or lightheadedness with hypotension: No Has patient had a PCN reaction causing severe rash involving mucus membranes or skin necrosis: No Has patient had a PCN reaction that required hospitalization: No Has patient had a PCN reaction occurring within the last 10 years: Yes If all of the above answers are "NO", then may proceed with Cephalosporin use.     Consultations:     Procedures/Studies: Dg Chest 2 View  Result Date: 10/17/2018 CLINICAL DATA:  Mid chest pain and tachycardia. EXAM: CHEST - 2 VIEW COMPARISON:  10/04/2013 FINDINGS: The heart size and mediastinal contours are within normal limits. Both lungs are clear. The visualized skeletal structures are unremarkable. IMPRESSION: No active cardiopulmonary disease. Electronically Signed   By: Paulina Fusi M.D.   On: 10/17/2018 09:53   US Pelvis (transabdominal Only)  Result Date: 09/22/2018 GYNECOLOGIC SONOGRAM Alexandra Kelley is a 37 y.o. G0P0000 LMP 09/18/2019 she is here for a pelvic sonogram for irregular vaginal bleeding. Uterus                     Not visualized  Endometrium         n/a Right ovary            Not visualized Left ovary               Not visualized Technician Comments: PELVIC US TA ONLY: unable to visualized uterus and ovaries because of pt body habitus,TV was attempted,but patient refused to continue exam. Karie Chimera 09/22/2018 3:45 PM Clinical Impression and recommendations: I have reviewed the sonogram results above.  BMI 65.8, gravida 0 para 0 not sexually active Combined with the patient's current clinical course, below are my impressions and any appropriate recommendations for management based on the sonographic findings: 1.  Inadequate ultrasound evaluation.  Transabdominal views are not able to visualize pelvic structures and GYN exam is refused by patient.  Unable to  comment on uterus endometrium or ovaries Tilda Burrow   Ct Abdomen Pelvis W Contrast  Result Date: 10/17/2018 CLINICAL DATA:  Acute epigastric abdominal pain. EXAM: CT ABDOMEN AND PELVIS WITH CONTRAST TECHNIQUE: Multidetector CT imaging of the abdomen and pelvis was performed using the standard protocol following bolus administration of intravenous contrast. CONTRAST:  ISOVUE-300 IOPAMIDOL (ISOVUE-300) INJECTION 61% COMPARISON:  CT scan of May 02, 2017. FINDINGS: Lower chest: No acute abnormality. Hepatobiliary: No focal liver abnormality is seen. No gallstones, gallbladder wall thickening, or biliary dilatation. Pancreas: Unremarkable. No pancreatic ductal dilatation or surrounding inflammatory changes. Spleen: Normal in size without focal abnormality. Adrenals/Urinary Tract: Adrenal glands appear normal. Bilateral nephrolithiasis is noted. No hydronephrosis or renal obstruction is noted. No ureteral calculi are noted. Urinary bladder is unremarkable. Stomach/Bowel: Stomach is within normal limits. Appendix appears normal. No evidence of bowel wall thickening, distention, or inflammatory changes. Vascular/Lymphatic: Aortic atherosclerosis. No enlarged abdominal or pelvic  lymph nodes. Reproductive: Uterus and bilateral adnexa are unremarkable. Other: Moderate size fat containing periumbilical hernia is noted. No ascites is noted. Musculoskeletal: No acute or significant osseous findings. IMPRESSION: Bilateral nonobstructive nephrolithiasis. Moderate size fat containing periumbilical hernia. Aortic Atherosclerosis (ICD10-I70.0). Electronically Signed   By: Lupita Raider, M.D.   On: 10/17/2018 08:54       Subjective: Has some abdominal cramping after eating, this resolved with Bentyl.  Has some burning in her chest which resolved after Maalox.  Discharge Exam: Vitals:   10/17/18 2103 10/17/18 2120 10/18/18 0525 10/18/18 1810  BP:  (!) 109/59 103/61 108/63  Pulse:  (!) 106 (!) 103 (!) 102  Resp:  20 20 18   Temp:  98.8 F (37.1 C) 98.8 F (37.1 C) 98.5 F (36.9 C)  TempSrc:  Oral Oral Oral  SpO2: 98% 98% 95% 100%  Weight:      Height:        General: Pt is alert, awake, not in acute distress Cardiovascular: RRR, S1/S2 +, no rubs, no gallops Respiratory: CTA bilaterally, no wheezing, no rhonchi Abdominal: Soft, NT, ND, bowel sounds + Extremities: no edema, no cyanosis    The results of significant diagnostics from this hospitalization (including imaging, microbiology, ancillary and laboratory) are listed below for reference.     Microbiology: Recent Results (from the past 240 hour(s))  Culture, blood (Routine X 2) w Reflex to ID Panel     Status: None (Preliminary result)   Collection Time: 10/17/18  2:23 PM  Result Value Ref Range Status   Specimen Description BLOOD LEFT ARM  Final   Special Requests   Final    BOTTLES DRAWN AEROBIC AND ANAEROBIC Blood Culture adequate volume   Culture   Final    NO GROWTH 1 DAY Performed at [facility], [address] [place], [place] [address]    Report Status PENDING  Incomplete  Culture, blood (Routine X 2) w Reflex to ID Panel     Status: None (Preliminary result)   Collection Time: 10/17/18  2:23  PM  Result Value Ref Range Status   Specimen Description BLOOD RIGHT ARM  Final   Special Requests   Final    BOTTLES DRAWN AEROBIC AND ANAEROBIC Blood Culture adequate volume   Culture   Final    NO GROWTH 1 DAY Performed at [facility], [address], [place], [place] [address]    Report Status PENDING  Incomplete     Labs: BNP (last 3 results) No results for input(s): BNP in the last  8760 hours. Basic Metabolic Panel: Recent Labs  Lab 10/17/18 0527 10/17/18 1422 10/18/18 0458  NA 135  --  137  K 4.3  --  3.6  CL 94*  --  105  CO2 27  --  26  GLUCOSE 371*  --  212*  BUN 9  --  5*  CREATININE 0.88 0.79 0.68  CALCIUM 10.2  --  8.4*  MG  --   --  1.8   Liver Function Tests: Recent Labs  Lab 10/17/18 0527 10/18/18 0458  AST 30 18  ALT 26 19  ALKPHOS 85 56  BILITOT 1.0 0.9  PROT 9.6* 6.8  ALBUMIN 4.7 3.2*   Recent Labs  Lab 10/17/18 0527  LIPASE 49   No results for input(s): AMMONIA in the last 168 hours. CBC: Recent Labs  Lab 10/17/18 0527 10/17/18 1422 10/18/18 0458  WBC 12.9* 9.8 7.7  NEUTROABS  --   --  4.7  HGB 14.8 12.9 12.5  HCT 46.9* 40.3 40.3  MCV 87.2 86.9 89.0  PLT 273 251 233   Cardiac Enzymes: No results for input(s): CKTOTAL, CKMB, CKMBINDEX, TROPONINI in the last 168 hours. BNP: Invalid input(s): POCBNP CBG: Recent Labs  Lab 10/17/18 2124 10/18/18 0304 10/18/18 0808 10/18/18 1119 10/18/18 1655  GLUCAP 192* 165* 197* 258* 160*   D-Dimer No results for input(s): DDIMER in the last 72 hours. Hgb A1c Recent Labs    10/17/18 1301  HGBA1C 7.6*   Lipid Profile No results for input(s): CHOL, HDL, LDLCALC, TRIG, CHOLHDL, LDLDIRECT in the last 72 hours. Thyroid function studies Recent Labs    10/17/18 1301  TSH 1.812   Anemia work up No results for input(s): VITAMINB12, FOLATE, FERRITIN, TIBC, IRON, RETICCTPCT in the last 72 hours. Urinalysis    Component Value Date/Time   COLORURINE YELLOW 10/17/2018 0617    APPEARANCEUR CLEAR 10/17/2018 0617   LABSPEC 1.023 10/17/2018 0617   PHURINE 7.0 10/17/2018 0617   GLUCOSEU >=500 (A) 10/17/2018 0617   HGBUR NEGATIVE 10/17/2018 0617   BILIRUBINUR NEGATIVE 10/17/2018 0617   KETONESUR 20 (A) 10/17/2018 0617   PROTEINUR NEGATIVE 10/17/2018 0617   UROBILINOGEN 0.2 10/16/2013 1224   NITRITE NEGATIVE 10/17/2018 0617   LEUKOCYTESUR TRACE (A) 10/17/2018 0617   Sepsis Labs Invalid input(s): PROCALCITONIN,  WBC,  LACTICIDVEN Microbiology Recent Results (from the past 240 hour(s))  Culture, blood (Routine X 2) w Reflex to ID Panel     Status: None (Preliminary result)   Collection Time: 10/17/18  2:23 PM  Result Value Ref Range Status   Specimen Description BLOOD LEFT ARM  Final   Special Requests   Final    BOTTLES DRAWN AEROBIC AND ANAEROBIC Blood Culture adequate volume   Culture   Final    NO GROWTH 1 DAY Performed at [facility], [address] [place], [place] [address]    Report Status PENDING  Incomplete  Culture, blood (Routine X 2) w Reflex to ID Panel     Status: None (Preliminary result)   Collection Time: 10/17/18  2:23 PM  Result Value Ref Range Status   Specimen Description BLOOD RIGHT ARM  Final   Special Requests   Final    BOTTLES DRAWN AEROBIC AND ANAEROBIC Blood Culture adequate volume   Culture   Final    NO GROWTH 1 DAY Performed at [facility], [address] [place], [place] [address]    Report Status PENDING  Incomplete     Time coordinating discharge:  SIGNED:   Erick Blinks, MD  [facility] 10/18/2018, 7:35 PM Pager   If 7PM-7AM, please contact night-coverage [email or web address] Password [email or web address]

## 2018-10-22 LAB — CULTURE, BLOOD (ROUTINE X 2)
CULTURE: NO GROWTH
Culture: NO GROWTH
SPECIAL REQUESTS: ADEQUATE
Special Requests: ADEQUATE

## 2018-10-30 ENCOUNTER — Telehealth: Payer: Self-pay | Admitting: Obstetrics and Gynecology

## 2018-10-30 NOTE — Telephone Encounter (Signed)
Received call from patient's mother Tammy, who reports she has been bleeding heavily the last few days, had several large gushes today and wondering what to do. Advised her to start the provera as prescribed by NP and call to get in with office appointment tomorrow. Adivsed to present to ED with any symptoms of anemia or worsening bleeding. She verbalizes understanding and will call in am.    K. Therese SarahMeryl Smitty Ackerley, M.D. Attending Center [facility](Faculty Practice)

## 2018-10-31 ENCOUNTER — Telehealth: Payer: Self-pay | Admitting: Adult Health

## 2018-10-31 NOTE — Telephone Encounter (Signed)
Pts mother called stating that her daughter was started on provera and she has had a period now for about a week. She is also asking if pt will have a period in Jan and Feb. Advised, per Victorino DikeJennifer, that the provera is doing what it is supposed to do and hopefully pt will not have a period in Jan or Feb. Advised to call back if bleeding lasts 2 weeks and to keep appt in Feb. Pts mother verbalized understanding.

## 2018-10-31 NOTE — Telephone Encounter (Signed)
Please call pt she would like to speak to a nurse °

## 2018-11-01 ENCOUNTER — Telehealth: Payer: Self-pay | Admitting: *Deleted

## 2018-11-01 MED ORDER — MEGESTROL ACETATE 40 MG PO TABS: ORAL_TABLET | ORAL | 0 refills | Status: DC

## 2018-11-01 NOTE — Telephone Encounter (Signed)
Alexandra LeeSabrina took provera and started a period, started bleeding 12/23 and heavy for 5 days now, changing every hour using super pads, will discuss with Dr Despina HiddenEure and call back

## 2018-11-01 NOTE — Telephone Encounter (Signed)
Discussed with Dr Despina HiddenEure, and will rx megace to stop heavy bleeding but he says to keep cycling with provera either monthly or every 3 months, Tammy to talk with [name] and will let me know which they choose.

## 2018-11-01 NOTE — Addendum Note (Signed)
Addended by: Cyril MourningGRIFFIN, Adhya Cocco A on: 11/01/2018 02:17 PM   Modules accepted: Orders

## 2018-11-04 ENCOUNTER — Other Ambulatory Visit: Payer: Self-pay | Admitting: Obstetrics & Gynecology

## 2018-11-04 ENCOUNTER — Telehealth: Payer: Self-pay | Admitting: *Deleted

## 2018-11-04 MED ORDER — FLUOCINONIDE-E 0.05 % EX CREA: 1.0000 "application " | TOPICAL_CREAM | Freq: Two times a day (BID) | CUTANEOUS | 0 refills | Status: DC

## 2018-11-04 NOTE — Telephone Encounter (Signed)
Lidex cream sent in

## 2018-11-04 NOTE — Telephone Encounter (Signed)
Informed pts mother that cream had been sent in .

## 2018-11-04 NOTE — Telephone Encounter (Signed)
Pts mother called stating that pt has had heavy bleeding and is now taking medication to help with that. However she states that pt has developed a sore area on her labia that she thinks is the result of feminine pad usage. She states that the pt c/o it burning. They have tried vagisil and nystatin with no relief. She is asking if there is anything else that can be used. Advised that I would send her request to a provider and get back with her.

## 2018-11-16 ENCOUNTER — Telehealth: Payer: Self-pay | Admitting: *Deleted

## 2018-11-16 NOTE — Telephone Encounter (Signed)
Can not take megace now, just keep it, and come in tomorrow to be seen instead of Monday

## 2018-11-16 NOTE — Telephone Encounter (Signed)
Spoke with pt's mom. Pt was put on Provera to have period. Pt had heavy bleeding and was put on Megace. Yesterday was last day for Megace at 1 a day. Pt is not bleeding now. Pt still has Megace left. Does she continue taking Megace once a day? Pt has a place on vaginal lip. Cream was prescribed. Using it BID. Area is draining. Also has an open area on side of stomach. Pt has appt scheduled with you on Monday. Does she need to be seen sooner? Tammy is using her Nystatin cream on Alexandra Kelley for some irritation. It wasn't prescribed for [name]. Pt is requesting that be ordered. Please advise. Thanks!!

## 2018-11-17 ENCOUNTER — Encounter: Payer: Self-pay | Admitting: Adult Health

## 2018-11-17 ENCOUNTER — Ambulatory Visit (INDEPENDENT_AMBULATORY_CARE_PROVIDER_SITE_OTHER): Payer: Medicaid Other | Admitting: Adult Health

## 2018-11-17 VITALS — BP 99/67 | HR 100 | Ht <= 58 in | Wt 307.4 lb

## 2018-11-17 DIAGNOSIS — R238 Other skin changes: Secondary | ICD-10-CM | POA: Diagnosis not present

## 2018-11-17 DIAGNOSIS — N766 Ulceration of vulva: Secondary | ICD-10-CM

## 2018-11-17 MED ORDER — SILVER SULFADIAZINE 1 % EX CREA: 1.0000 "application " | TOPICAL_CREAM | Freq: Every day | CUTANEOUS | 0 refills | Status: DC

## 2018-11-17 NOTE — Progress Notes (Addendum)
Patient ID: Alexandra Kelley, female   DOB: 09/25/1981, 38 y.o.   MRN: [id number] History of Present Illness: Alexandra Kelley is a 38 year old white female in with her mom, complaining of vulva pain and raw area and skin irritation on stomach.   Current Medications, Allergies, Past Medical History, Past Surgical History, Family History and Social History were reviewed in [employer] record.     Review of Systems:  Has pain left labia with raw area,burns when pee hits it Has skin irritation of stomach   Physical Exam:BP 99/67 (BP Location: Left Arm, Patient Position: Sitting, Cuff Size: Normal)   Pulse 100   Ht 4\' 10"  (1.473 m)   Wt (!) 307 lb 6.4 oz (139.4 kg)   BMI 64.25 kg/m  General:  Well developed, well nourished, no acute distress Skin:  Warm and dry Abdomen:  Soft, obese, has thin skin with some tearing and bruising, with area that is oozing serous fluid, painted with venetian violet. Pelvic:  External genitalia is normal in appearance, has ulceration left labia about 1 cm round, Dr Despina Hidden in for co exam Psych:  No mood changes, alert and cooperative,seems happy Keep clean and dry, do not rub, put zinc oxide on vulva and silvadene stomach. Try and let get some air.  Examination chaperoned by Malachy Mood LPN.   Impression: 1. Vulvar ulceration   2. Skin irritation       Plan: Meds ordered this encounter  Medications  . silver sulfADIAZINE (SILVADENE) 1 % cream    Sig: Apply 1 application topically daily.    Dispense:  50 g    Refill:  0    Order Specific Question:   Supervising Provider    Answer:   Despina Hidden, LUTHER H [[id number]]   F/U in 3 weeks

## 2018-11-21 ENCOUNTER — Ambulatory Visit: Payer: Medicaid Other | Admitting: Adult Health

## 2018-11-21 ENCOUNTER — Telehealth: Payer: Self-pay | Admitting: *Deleted

## 2018-11-21 MED ORDER — FLUCONAZOLE 100 MG PO TABS: ORAL_TABLET | ORAL | 0 refills | Status: DC

## 2018-11-21 NOTE — Telephone Encounter (Signed)
Will rx diflucan  

## 2018-11-21 NOTE — Telephone Encounter (Signed)
Pts mother states that Alexandra Kelley has vaginal itching with discharge and a fishy odor. She is requesting medication be sent in. Advised that I would send her request to a provider and she could check with the pharmacy later today or I would call back with recommendations

## 2018-12-07 ENCOUNTER — Ambulatory Visit (INDEPENDENT_AMBULATORY_CARE_PROVIDER_SITE_OTHER): Payer: Medicaid Other | Admitting: Adult Health

## 2018-12-07 ENCOUNTER — Encounter: Payer: Self-pay | Admitting: Adult Health

## 2018-12-07 VITALS — BP 119/71 | HR 98 | Ht <= 58 in | Wt 304.0 lb

## 2018-12-07 DIAGNOSIS — R238 Other skin changes: Secondary | ICD-10-CM | POA: Diagnosis not present

## 2018-12-07 NOTE — Progress Notes (Signed)
Patient ID: Alexandra Kelley, female   DOB: 22-May-1981, 38 y.o.   MRN: [id number] History of Present Illness: Alexandra Kelley is a 38 year old white female, back in follow up with her mom for vulva pain, with raw area, and skin irritation under panniculus.    Current Medications, Allergies, Past Medical History, Past Surgical History, Family History and Social History were reviewed in [employer] record.     Review of Systems: Feels much better   Physical Exam:BP 119/71 (BP Location: Left Wrist, Patient Position: Sitting, Cuff Size: Normal)   Pulse 98   Ht 4\' 10"  (1.473 m)   Wt (!) 304 lb (137.9 kg)   BMI 63.54 kg/m  General:  Well developed, well nourished, no acute distress Skin:  Warm and dry Abdomen:  Soft, non tender, obese, skin thin, no raw areas, under panniculus.  Pelvic:  External genitalia is normal in appearance,has scab left labia, where ulcer was. Psych:  No mood changes, alert and cooperative,seems happy   Impression: 1. Skin irritation       Plan: Continue nystatin as needed, and sivadene Take provera in March and let me know how cycle is  F/U prn

## 2018-12-14 ENCOUNTER — Telehealth: Payer: Self-pay | Admitting: Adult Health

## 2018-12-14 MED ORDER — NYSTATIN 100000 UNIT/GM EX POWD: Freq: Three times a day (TID) | CUTANEOUS | 3 refills | Status: DC

## 2018-12-14 NOTE — Telephone Encounter (Signed)
Will rx nystatin powder

## 2018-12-14 NOTE — Telephone Encounter (Signed)
Patient's mom called stating that she would like a call back from Jennifer's nurse. Pt's mom did not state the reason why please contact pt's mom.

## 2018-12-14 NOTE — Telephone Encounter (Signed)
Spoke with pt's mom. Pt has rash where stomach lays on legs. It looks chapped. Has been using Nystatin cream. Britt Boozer has mentioned a powder and she was wondering if she could try that. Please advise. Thanks!! JSY

## 2018-12-20 ENCOUNTER — Telehealth: Payer: Self-pay | Admitting: Adult Health

## 2018-12-20 MED ORDER — MEGESTROL ACETATE 40 MG PO TABS: ORAL_TABLET | ORAL | 0 refills | Status: DC

## 2018-12-20 NOTE — Telephone Encounter (Signed)
Spoke with Alexandra Kelley. Audianna has started her period again and it's heavy. Period started first of last week. It's still heavy. Was passing clots at first but not now. Was told to let you know if this happened. Thanks!! JSY

## 2018-12-20 NOTE — Telephone Encounter (Signed)
Left message @ 2:37 pm. JSY 

## 2018-12-20 NOTE — Telephone Encounter (Signed)
Patient's mom called stating that she would like to speak with [name] nurse regarding her daughter Alexandra Kelley. Please contact pt's mom.

## 2018-12-20 NOTE — Telephone Encounter (Signed)
Bleeding heavy after provera, will rx megace to stop bleeding

## 2018-12-21 ENCOUNTER — Ambulatory Visit: Payer: Medicaid Other | Admitting: Adult Health

## 2019-03-02 ENCOUNTER — Encounter (INDEPENDENT_AMBULATORY_CARE_PROVIDER_SITE_OTHER): Payer: Self-pay | Admitting: Internal Medicine

## 2019-03-02 ENCOUNTER — Ambulatory Visit (INDEPENDENT_AMBULATORY_CARE_PROVIDER_SITE_OTHER): Payer: Medicaid Other | Admitting: Internal Medicine

## 2019-03-02 ENCOUNTER — Other Ambulatory Visit: Payer: Self-pay

## 2019-03-02 VITALS — BP 109/73 | HR 137 | Temp 98.8°F | Ht 60.0 in | Wt 293.7 lb

## 2019-03-02 DIAGNOSIS — R1013 Epigastric pain: Secondary | ICD-10-CM

## 2019-03-02 DIAGNOSIS — K588 Other irritable bowel syndrome: Secondary | ICD-10-CM | POA: Diagnosis not present

## 2019-03-02 DIAGNOSIS — K219 Gastro-esophageal reflux disease without esophagitis: Secondary | ICD-10-CM | POA: Diagnosis not present

## 2019-03-02 NOTE — Progress Notes (Addendum)
Subjective:    Patient ID: Alexandra Kelley, female    DOB: Jul 14, 1981, 38 y.o.   MRN: [id number] Mother is providing most of the history.  HPI Patient has Down's syndrome.  Referred by Cassell Clementenika. [place] NP for abdominal pain/acid reflux. Admitted to [facility] in December with abdominal pain . She was dehydrated, and had abdominal discomfort. There was no diarrhea.  Mother states patient had another episode of epigastric pain. She took a Protonix which helped. Mother states her daughter's abdomen is still sore and patient agrees.  The Dicyclomine was restarted Tuesday. Her appetite is okay. Really no weight loss.  She was having a BM after meals but this has improved. No NSAIDs.  Hx of diabetes x 5-8 yrs.  10/17/2018 CT abdomen/pelvis with CM: epigastric pain. Bilateral nonobstructive nephrolithiasis. Moderate size fat containing periumbilical hernia.   Review of Systems Past Medical History:  Diagnosis Date  . Carpal tunnel syndrome    both wrists  . Diabetes mellitus without complication (HCC)    Dr. Lurene ShadowBallen  . GERD (gastroesophageal reflux disease)   . Kidney stone   . Neuropathy   . Thyroid disease     Past Surgical History:  Procedure Laterality Date  . BACK SURGERY    . FEMUR IM NAIL Left 10/03/2013   Procedure: INTRAMEDULLARY (IM) RETROGRADE FEMORAL NAILING;  Surgeon: Eldred MangesMark C Yates, MD;  Location: [facility];  Service: Orthopedics;  Laterality: Left;  . tubes in ears      Allergies  Allergen Reactions  . Ceclor [Cefaclor]     Rash  . Invokana [Canagliflozin]     Severe yeast infections  . Lyrica [Pregabalin] Itching  . Metformin And Related     GI distress  . Onglyza [Saxagliptin]     headaches  . Zofran [Ondansetron Hcl] Other (See Comments)    Itchy rash in mouth  . Penicillins Rash    Has patient had a PCN reaction causing immediate rash, facial/tongue/throat swelling, SOB or lightheadedness with hypotension: No Has patient had a PCN reaction causing severe rash  involving mucus membranes or skin necrosis: No Has patient had a PCN reaction that required hospitalization: No Has patient had a PCN reaction occurring within the last 10 years: Yes If all of the above answers are "NO", then may proceed with Cephalosporin use.     Current Outpatient Medications on File Prior to Visit  Medication Sig Dispense Refill  . dicyclomine (BENTYL) 10 MG capsule Take 1 capsule (10 mg total) by mouth 3 (three) times daily as needed for spasms. (Patient taking differently: Take 10 mg by mouth as needed for spasms. ) 30 capsule 0  . gabapentin (NEURONTIN) 300 MG capsule Take 300 mg by mouth 3 (three) times daily.  3  . glimepiride (AMARYL) 4 MG tablet Take 4 mg by mouth 2 (two) times daily.     Marland Kitchen. levothyroxine (SYNTHROID, LEVOTHROID) 200 MCG tablet Take 200 mcg by mouth daily before breakfast.    . nortriptyline (PAMELOR) 25 MG capsule Take 1-2 tablets at bedtime. (Patient taking differently: Take 25 mg by mouth at bedtime. Take 1-2 tablets at bedtime.) 60 capsule 5  . pantoprazole (PROTONIX) 40 MG tablet Take 1 tablet (40 mg total) by mouth daily. 30 tablet 1  . traMADol (ULTRAM) 50 MG tablet TAKE 1 TABLET BY MOUTH EVERY 12 HOURS AS NEEDED (Patient taking differently: Take 50 mg by mouth 2 (two) times daily. ) 60 tablet 3  . fluocinonide-emollient (LIDEX-E) 0.05 % cream Apply 1 application  topically 2 (two) times daily. 30 g 0  . promethazine (PHENERGAN) 12.5 MG tablet Take 1 tablet (12.5 mg total) by mouth every 6 (six) hours as needed for nausea. 30 tablet 0  . silver sulfADIAZINE (SILVADENE) 1 % cream Apply 1 application topically daily. 50 g 0   No current facility-administered medications on file prior to visit.         Objective:   Physical Exam Blood pressure 109/73, pulse (!) 137, temperature 98.8 F (37.1 C), height 5' (1.524 m), weight 293 lb 11.2 oz (133.2 kg). Alert and oriented. Skin warm and dry. Oral mucosa is moist.   . Sclera anicteric, conjunctivae  is pink. Thyroid not enlarged. No cervical lymphadenopathy. Lungs clear. Heart regular rate and rhythm.  Abdomen is soft. Bowel sounds are positive. No hepatomegaly. No abdominal masses felt. No tenderness.  No edema to lower extremities.  Morbidly obese.          Assessment & Plan:  Epigastric pain. Probably GERD. Patient will continue the Protonix daily before breakfast. Continue the Dicyclomine BID OV in 3 months.

## 2019-03-02 NOTE — Patient Instructions (Signed)
Continue the Protonix and Dicyclomine. OV in 3 months.

## 2019-03-16 ENCOUNTER — Telehealth: Payer: Self-pay | Admitting: Adult Health

## 2019-03-16 MED ORDER — NYSTATIN 100000 UNIT/GM EX CREA: 1.0000 "application " | TOPICAL_CREAM | Freq: Two times a day (BID) | CUTANEOUS | 3 refills | Status: DC

## 2019-03-16 MED ORDER — MEGESTROL ACETATE 40 MG PO TABS: ORAL_TABLET | ORAL | 1 refills | Status: DC

## 2019-03-16 NOTE — Telephone Encounter (Signed)
Spoke with Tammy. Jru started period Monday. Tammy is requesting Megace. Also, requesting Nystatin cream. Thanks!! JSY

## 2019-03-16 NOTE — Telephone Encounter (Signed)
Refilled megace and nystatin cream

## 2019-03-16 NOTE — Telephone Encounter (Signed)
Pts mother calling and states that her daughter has started her menstrual cycle and she is needing Victorino Dike to call in medication that stops the bleeding.

## 2019-05-31 ENCOUNTER — Ambulatory Visit (INDEPENDENT_AMBULATORY_CARE_PROVIDER_SITE_OTHER): Payer: Medicaid Other | Admitting: Internal Medicine

## 2019-06-13 ENCOUNTER — Ambulatory Visit (INDEPENDENT_AMBULATORY_CARE_PROVIDER_SITE_OTHER): Payer: Medicaid Other | Admitting: Nurse Practitioner

## 2019-06-21 ENCOUNTER — Encounter (INDEPENDENT_AMBULATORY_CARE_PROVIDER_SITE_OTHER): Payer: Self-pay | Admitting: Nurse Practitioner

## 2019-06-21 ENCOUNTER — Other Ambulatory Visit: Payer: Self-pay

## 2019-06-21 ENCOUNTER — Ambulatory Visit (INDEPENDENT_AMBULATORY_CARE_PROVIDER_SITE_OTHER): Payer: Medicaid Other | Admitting: Nurse Practitioner

## 2019-06-21 VITALS — BP 94/56 | HR 120 | Temp 98.9°F | Ht 64.0 in | Wt 293.7 lb

## 2019-06-21 DIAGNOSIS — R1013 Epigastric pain: Secondary | ICD-10-CM | POA: Diagnosis not present

## 2019-06-21 DIAGNOSIS — K219 Gastro-esophageal reflux disease without esophagitis: Secondary | ICD-10-CM

## 2019-06-21 NOTE — Progress Notes (Signed)
Subjective:    Patient ID: Alexandra Kelley, female    DOB: [birth year], 38 y.o.   MRN: [id number]  HPI  Patient with a past medical history significant for Down syndrome, diabetes mellitus type 2, morbid obesity, GERD, carpal tunnel syndrome, neuropathy, kidney stones and hypothyroidism.  She presents today for GERD follow-up.  She is no longer having epigastric pain on Protonix 40 mg twice daily.  She denies having any heartburn or dysphasia.  No lower abdominal pain.  She is passing a normal formed bowel movement most days.  She denies seeing any blood with her bowel movements.  Her heart rate is 120 office today.  Her mother stated her heart rate typically is elevated which has been evaluated by the patient's primary care physician.  The time of her last office visit 03/02/2019 her heart rate was noted to be 136.  She is asymptomatic.  She denies chest pain, shortness of breath or dizziness.  Past Medical History:  Diagnosis Date  . Carpal tunnel syndrome    both wrists  . Diabetes mellitus without complication ([facility])    Dr. Bubba Camp  . GERD (gastroesophageal reflux disease)   . Kidney stone   . Neuropathy   . Thyroid disease       Past Surgical History:  Procedure Laterality Date  . BACK SURGERY    . FEMUR IM NAIL Left 10/03/2013   Procedure: INTRAMEDULLARY (IM) RETROGRADE FEMORAL NAILING;  Surgeon: Marybelle Killings, MD;  Location: [facility];  Service: Orthopedics;  Laterality: Left;  . tubes in ears     Current Outpatient Medications on File Prior to Visit  Medication Sig Dispense Refill  . insulin aspart (NOVOLOG) 100 UNIT/ML injection Inject 15 Units into the skin 2 (two) times daily.    . nortriptyline (PAMELOR) 25 MG capsule Take 25 mg by mouth at bedtime.    . dicyclomine (BENTYL) 10 MG capsule Take 1 capsule (10 mg total) by mouth 3 (three) times daily as needed for spasms. (Patient taking differently: Take 10 mg by mouth as needed for spasms. ) 30 capsule 0  . gabapentin (NEURONTIN)  300 MG capsule Take 300 mg by mouth 3 (three) times daily.  3  . levothyroxine (SYNTHROID, LEVOTHROID) 200 MCG tablet Take 200 mcg by mouth daily before breakfast.    . nortriptyline (PAMELOR) 25 MG capsule Take 1-2 tablets at bedtime. (Patient taking differently: Take 25 mg by mouth at bedtime. Take 1-2 tablets at bedtime.) 60 capsule 5  . nystatin cream (MYCOSTATIN) Apply 1 application topically 2 (two) times daily. 30 g 3  . pantoprazole (PROTONIX) 40 MG tablet Take 1 tablet (40 mg total) by mouth daily. 30 tablet 1  . silver sulfADIAZINE (SILVADENE) 1 % cream Apply 1 application topically daily. 50 g 0  . traMADol (ULTRAM) 50 MG tablet TAKE 1 TABLET BY MOUTH EVERY 12 HOURS AS NEEDED (Patient taking differently: Take 50 mg by mouth 2 (two) times daily. ) 60 tablet 3   No current facility-administered medications on file prior to visit.    Allergies  Allergen Reactions  . Ceclor [Cefaclor]     Rash  . Invokana [Canagliflozin]     Severe yeast infections  . Lyrica [Pregabalin] Itching  . Metformin And Related     GI distress  . Onglyza [Saxagliptin]     headaches  . Zofran [Ondansetron Hcl] Other (See Comments)    Itchy rash in mouth  . Penicillins Rash    Has patient had a  PCN reaction causing immediate rash, facial/tongue/throat swelling, SOB or lightheadedness with hypotension: No Has patient had a PCN reaction causing severe rash involving mucus membranes or skin necrosis: No Has patient had a PCN reaction that required hospitalization: No Has patient had a PCN reaction occurring within the last 10 years: Yes If all of the above answers are "NO", then may proceed with Cephalosporin use.     Objective:   Physical Exam Blood pressure (!) 94/56, pulse (!) 120, temperature 98.9 F (37.2 C), height 5\' 4"  (1.626 m), weight 293 lb 11.2 oz (133.2 kg).  General: 38 year old obese female with Down syndrome in no acute distress Heart: Tachycardic, no murmur Lungs: Clear throughout.   Abdomen: Obese, soft, nontender, umbilical hernia, no masses or organomegaly appreciated Extremities: No edema Neuro: Alert and oriented x3, she answers questions appropriately      Assessment & Plan:  861.  38 year old obese female with Down syndrome presents today for GERD follow-up, prior epigastric pain has resolved -Continue Protonix 40 mg twice daily -Avoid eating large meals at nighttime, avoid fatty foods -Follow-up in the office in 6 months and as needed  2.  Tachycardia -Advised patient to follow-up with primary care physician for further evaluation

## 2019-06-21 NOTE — Patient Instructions (Signed)
1. Continue Protonix 40mg  once capsule by mouth twice daily  2. Please follow up with your primary doctor regarding your fast heart rate   3. Avoid fatty foods, avoid eating large meals later at night  4. Follow up in our office in 6 months

## 2019-06-22 DIAGNOSIS — R1010 Upper abdominal pain, unspecified: Secondary | ICD-10-CM | POA: Insufficient documentation

## 2019-06-22 DIAGNOSIS — R1013 Epigastric pain: Secondary | ICD-10-CM | POA: Insufficient documentation

## 2019-06-22 DIAGNOSIS — K219 Gastro-esophageal reflux disease without esophagitis: Secondary | ICD-10-CM | POA: Insufficient documentation

## 2019-07-21 ENCOUNTER — Telehealth: Payer: Self-pay | Admitting: *Deleted

## 2019-07-21 MED ORDER — NYSTATIN 100000 UNIT/GM EX CREA: 1.0000 "application " | TOPICAL_CREAM | Freq: Two times a day (BID) | CUTANEOUS | 3 refills | Status: DC

## 2019-07-21 NOTE — Telephone Encounter (Signed)
Pt needs refills on Nystatin cream and powder. Jenn usually refills meds. Thanks!! [place]

## 2019-10-03 ENCOUNTER — Encounter (INDEPENDENT_AMBULATORY_CARE_PROVIDER_SITE_OTHER): Payer: Self-pay | Admitting: *Deleted

## 2019-11-16 ENCOUNTER — Telehealth (INDEPENDENT_AMBULATORY_CARE_PROVIDER_SITE_OTHER): Payer: Self-pay | Admitting: Gastroenterology

## 2019-11-16 NOTE — Telephone Encounter (Signed)
Mother Babette Relic) called stated patient needs refill on her medication did not say which medication - would like a call back at [phone]

## 2019-11-16 NOTE — Telephone Encounter (Signed)
The medication that is needed to be filled is her Pantoprazole.

## 2019-11-16 NOTE — Telephone Encounter (Signed)
Tammy - can you call and find out which med she needs refilled?

## 2019-11-17 MED ORDER — PANTOPRAZOLE SODIUM 40 MG PO TBEC: 40.0000 mg | DELAYED_RELEASE_TABLET | Freq: Every day | ORAL | 3 refills | Status: DC

## 2019-11-17 NOTE — Telephone Encounter (Signed)
Refill sent to pharmacy for pantoprazole.

## 2019-12-21 ENCOUNTER — Ambulatory Visit (INDEPENDENT_AMBULATORY_CARE_PROVIDER_SITE_OTHER): Payer: Medicaid Other | Admitting: Gastroenterology

## 2019-12-25 ENCOUNTER — Ambulatory Visit (INDEPENDENT_AMBULATORY_CARE_PROVIDER_SITE_OTHER): Payer: Medicaid Other | Admitting: Gastroenterology

## 2020-01-01 ENCOUNTER — Other Ambulatory Visit: Payer: Self-pay

## 2020-01-01 ENCOUNTER — Ambulatory Visit (INDEPENDENT_AMBULATORY_CARE_PROVIDER_SITE_OTHER): Payer: Medicaid Other | Admitting: Internal Medicine

## 2020-01-01 ENCOUNTER — Encounter (INDEPENDENT_AMBULATORY_CARE_PROVIDER_SITE_OTHER): Payer: Self-pay | Admitting: Internal Medicine

## 2020-01-01 VITALS — BP 105/70 | HR 111 | Temp 98.1°F | Ht 64.0 in | Wt 285.2 lb

## 2020-01-01 DIAGNOSIS — R1010 Upper abdominal pain, unspecified: Secondary | ICD-10-CM

## 2020-01-01 DIAGNOSIS — K219 Gastro-esophageal reflux disease without esophagitis: Secondary | ICD-10-CM | POA: Diagnosis not present

## 2020-01-01 DIAGNOSIS — K589 Irritable bowel syndrome without diarrhea: Secondary | ICD-10-CM | POA: Insufficient documentation

## 2020-01-01 DIAGNOSIS — K580 Irritable bowel syndrome with diarrhea: Secondary | ICD-10-CM

## 2020-01-01 NOTE — Patient Instructions (Signed)
Remember to take dicyclomine at least 30 minutes before meals. Physician will call with results of MRI when completed. Keep symptom diary as discussed.

## 2020-01-01 NOTE — Progress Notes (Signed)
Presenting complaint;  Follow-up for GERD and IBS. Patient complains of upper abdominal pain.  Database and subjective:  History is provided by the patient and her mother who is at bedside.  Patient is 39 year old Caucasian female with history of diabetes mellitus morbid obesity Down syndrome as well as chronic GERD and peripheral neuropathy and hypothyroidism who is here for scheduled visit accompanied by her mother Babette Relic. She was last seen on 06/21/2019 and appeared to be doing well. Patient states she is doing well as far as heartburn is concerned.  She feels medication is working.  She remains with postprandial diarrhea.  On most days she has 3 bowel movements.  She generally has a BM within few minutes of each meal.  She feels dicyclomine is helping.  She is still having sporadic explosive bowel movement.  She denies melena or rectal bleeding.  She is not having any side effects with dicyclomine.  She is watching her diet.  She has cut back on Colace.  She drinks primarily green tea and maybe 2 cans of Sprite daily. Her mother says that she is having intermittent epigastric pain.  She had an episode on Saturday night/2 days ago she complained of severe pain in epigastric region radiating to the left and into her back.  She had dry heaves.  She felt cold.  No history of fever.  She is having at least one episode of pain per month.  She had abdominal pelvic CT in December 2019 which revealed moderate ascites periumbilical hernia and nonobstructive nephrolithiasis.  Pain lasted for several hours.  This morning she woke up around 5:00 and had large loose explosive bowel movement.   She has lost 8 pounds since her last visit she is trying her best to lose weight. Patient takes gabapentin and nortriptyline for peripheral neuropathy. She does not take OTC NSAIDs.  Current Medications: Outpatient Encounter Medications as of 01/01/2020  Medication Sig  . dicyclomine (BENTYL) 10 MG capsule Take 1 capsule  (10 mg total) by mouth 3 (three) times daily as needed for spasms. (Patient taking differently: Take 10 mg by mouth as needed for spasms. )  . gabapentin (NEURONTIN) 300 MG capsule Take 300 mg by mouth 3 (three) times daily.  . insulin aspart (NOVOLOG) 100 UNIT/ML injection Inject into the skin 2 (two) times daily. 25 units in the morning , 20 at night  . levothyroxine (SYNTHROID, LEVOTHROID) 200 MCG tablet Take 175 mcg by mouth daily before breakfast.   . nortriptyline (PAMELOR) 25 MG capsule Take 25 mg by mouth at bedtime.  Marland Kitchen nystatin cream (MYCOSTATIN) Apply 1 application topically 2 (two) times daily.  . pantoprazole (PROTONIX) 40 MG tablet Take 1 tablet (40 mg total) by mouth daily.  . silver sulfADIAZINE (SILVADENE) 1 % cream Apply 1 application topically daily.  . traMADol (ULTRAM) 50 MG tablet TAKE 1 TABLET BY MOUTH EVERY 12 HOURS AS NEEDED (Patient taking differently: Take 50 mg by mouth 2 (two) times daily. )  . [DISCONTINUED] nortriptyline (PAMELOR) 25 MG capsule Take 1-2 tablets at bedtime. (Patient taking differently: Take 25 mg by mouth at bedtime. Take 1-2 tablets at bedtime.)   No facility-administered encounter medications on file as of 01/01/2020.   Past Medical History:  Diagnosis Date  . Carpal tunnel syndrome    both wrists  . Diabetes mellitus without complication (HCC)    Dr. Lurene Shadow  . GERD (gastroesophageal reflux disease)   . Kidney stone   . Neuropathy   . Thyroid disease  Past Surgical History:  Procedure Laterality Date  . BACK SURGERY    . FEMUR IM NAIL Left 10/03/2013   Procedure: INTRAMEDULLARY (IM) RETROGRADE FEMORAL NAILING;  Surgeon: Marybelle Killings, MD;  Location: [facility];  Service: Orthopedics;  Laterality: Left;  . tubes in ears       Objective: Blood pressure 105/70, pulse (!) 111, temperature 98.1 F (36.7 C), temperature source Temporal, height 5\' 4"  (1.626 m), weight 285 lb 4 oz (129.4 kg). Patient is alert and in no acute distress. Patient is  wearing facial mask. Conjunctiva is pink. Sclera is nonicteric Oropharyngeal mucosa is normal. No neck masses or thyromegaly noted. Cardiac exam with regular rhythm normal S1 and S2. No murmur or gallop noted. Lungs are clear to auscultation. Abdomen abdomen is very obese with large mid abdominal/umbilical hernia which is partially reducible.  Bowel sounds are normal.  On palpation she has mild to moderate midepigastric tenderness as well as mild tenderness below the left and right costal margin.  Difficult to discern if she has a Battle's splenomegaly. No LE edema or clubbing noted.  Lab data.  CT images from 10/17/2018 reviewed. Results as above.  Assessment:  #1.  Chronic GERD.  She is doing well with therapy.  #2.  Diarrhea.  Her symptoms are typical of irritable bowel syndrome and she is somewhat improved with low-dose dicyclomine.  #3.  Epigastric pain associated with heaving.  She is having intermittent episodes for more than a year.  Differential diagnosis includes pancreatitis peptic ulcer disease or gastritis.  I doubt that this pain is due to IBS.  With history of diarrhea she certainly could have colitis although she is felt to have IBS.  I doubt that she has cholelithiasis.  She could also have pancreas divisum.  Most of her pain is in the middle and in the left side.  Because of her body habitus it would be hard to do a high-quality abdominal ultrasound.  Therefore I feel best test would be MR.  Plan:  Patient advised to take dicyclomine at least 30 minutes before each meal. Proceed with MR abdomen with and without contrast. Will consider blood work if she has another episode of significant pain.  Blood work to consist of CBC LFTs serum amylase and lipase. Office visit in 3 months.

## 2020-01-02 ENCOUNTER — Telehealth: Payer: Self-pay | Admitting: *Deleted

## 2020-01-02 MED ORDER — NYSTATIN 100000 UNIT/GM EX CREA: 1.0000 "application " | TOPICAL_CREAM | Freq: Two times a day (BID) | CUTANEOUS | 3 refills | Status: DC

## 2020-01-02 NOTE — Telephone Encounter (Signed)
Mom wants [place] to call her regarding on of her daughters medications.

## 2020-01-02 NOTE — Telephone Encounter (Signed)
Pt is needing a refill on Nystatin cream. Please advise. Thanks!! JSY

## 2020-01-02 NOTE — Telephone Encounter (Signed)
Refill nystatin cream

## 2020-01-08 ENCOUNTER — Ambulatory Visit (INDEPENDENT_AMBULATORY_CARE_PROVIDER_SITE_OTHER): Payer: Medicaid Other | Admitting: Internal Medicine

## 2020-01-31 ENCOUNTER — Other Ambulatory Visit (INDEPENDENT_AMBULATORY_CARE_PROVIDER_SITE_OTHER): Payer: Self-pay | Admitting: *Deleted

## 2020-01-31 ENCOUNTER — Other Ambulatory Visit: Payer: Self-pay

## 2020-01-31 ENCOUNTER — Encounter (HOSPITAL_COMMUNITY): Payer: Self-pay

## 2020-01-31 ENCOUNTER — Ambulatory Visit (HOSPITAL_COMMUNITY)
Admission: RE | Admit: 2020-01-31 | Discharge: 2020-01-31 | Disposition: A | Discharge status: Home / Self Care | Payer: Medicaid Other | Source: Ambulatory Visit | Attending: Internal Medicine | Admitting: Internal Medicine

## 2020-01-31 ENCOUNTER — Telehealth (INDEPENDENT_AMBULATORY_CARE_PROVIDER_SITE_OTHER): Payer: Self-pay | Admitting: *Deleted

## 2020-01-31 DIAGNOSIS — R1010 Upper abdominal pain, unspecified: Secondary | ICD-10-CM

## 2020-01-31 NOTE — Telephone Encounter (Signed)
Alexandra Kelley was unable to do MRI at [facility] , this was due to size. Please make appointment at [facility]. The mother ask that it not be on a Monday or a Friday , prefers afternoon appointment.

## 2020-01-31 NOTE — Telephone Encounter (Signed)
Spoke to [facility] and they will call patient's mom directly to schedule MRI

## 2020-02-29 ENCOUNTER — Telehealth (INDEPENDENT_AMBULATORY_CARE_PROVIDER_SITE_OTHER): Payer: Self-pay | Admitting: *Deleted

## 2020-02-29 ENCOUNTER — Ambulatory Visit
Admission: RE | Admit: 2020-02-29 | Discharge: 2020-02-29 | Disposition: A | Discharge status: Home / Self Care | Payer: Medicaid Other | Source: Ambulatory Visit | Attending: Internal Medicine | Admitting: Internal Medicine

## 2020-02-29 DIAGNOSIS — R1010 Upper abdominal pain, unspecified: Secondary | ICD-10-CM

## 2020-02-29 NOTE — Telephone Encounter (Signed)
Patient has not been able to do MRI you wanted -patient can't do MRI at [facility] due to weight restrictions so I sch'd her for [facility] - she couldn't do that because she couldn't lay on her back for that amount of time -- her mom wanted to know if there is another test you can order - please advise

## 2020-03-01 NOTE — Telephone Encounter (Signed)
She already had a CT done in 2019. We are looking for pancreatic abnormalities MR is a test of choice. If she cannot do this test then we will proceed with pancreas protocol CT.

## 2020-03-05 NOTE — Telephone Encounter (Signed)
Info submitted to insurance for PA for CT, patient's mom aware

## 2020-03-12 ENCOUNTER — Other Ambulatory Visit (INDEPENDENT_AMBULATORY_CARE_PROVIDER_SITE_OTHER): Payer: Self-pay | Admitting: *Deleted

## 2020-03-12 DIAGNOSIS — Z01812 Encounter for preprocedural laboratory examination: Secondary | ICD-10-CM

## 2020-03-12 DIAGNOSIS — R1013 Epigastric pain: Secondary | ICD-10-CM

## 2020-03-12 NOTE — Telephone Encounter (Signed)
CT authorized and sch'd 03/22/20 at 300 (245), npo 4 hrs prior, left detailed message for patient's mom

## 2020-03-15 ENCOUNTER — Other Ambulatory Visit (INDEPENDENT_AMBULATORY_CARE_PROVIDER_SITE_OTHER): Payer: Self-pay | Admitting: Gastroenterology

## 2020-03-22 ENCOUNTER — Ambulatory Visit (HOSPITAL_COMMUNITY)
Admission: RE | Admit: 2020-03-22 | Discharge: 2020-03-22 | Disposition: A | Discharge status: Home / Self Care | Payer: Medicaid Other | Source: Ambulatory Visit | Attending: Internal Medicine | Admitting: Internal Medicine

## 2020-03-22 ENCOUNTER — Other Ambulatory Visit: Payer: Self-pay

## 2020-03-22 DIAGNOSIS — R1013 Epigastric pain: Secondary | ICD-10-CM | POA: Insufficient documentation

## 2020-03-22 LAB — POCT I-STAT CREATININE: Creatinine, Ser: 0.6 mg/dL (ref 0.44–1.00)

## 2020-03-22 MED ORDER — IOHEXOL 300 MG/ML  SOLN
100.0000 mL | Freq: Once | INTRAMUSCULAR | Status: AC | PRN
  Administered 2020-03-22: 100 mL via INTRAVENOUS

## 2020-03-26 ENCOUNTER — Other Ambulatory Visit (INDEPENDENT_AMBULATORY_CARE_PROVIDER_SITE_OTHER): Payer: Self-pay | Admitting: *Deleted

## 2020-03-26 DIAGNOSIS — R1013 Epigastric pain: Secondary | ICD-10-CM

## 2020-04-02 ENCOUNTER — Ambulatory Visit (HOSPITAL_COMMUNITY)
Admission: RE | Admit: 2020-04-02 | Discharge: 2020-04-02 | Disposition: A | Discharge status: Home / Self Care | Payer: Medicaid Other | Source: Ambulatory Visit | Attending: Internal Medicine | Admitting: Internal Medicine

## 2020-04-02 ENCOUNTER — Other Ambulatory Visit: Payer: Self-pay

## 2020-04-02 DIAGNOSIS — R1013 Epigastric pain: Secondary | ICD-10-CM | POA: Insufficient documentation

## 2020-04-04 ENCOUNTER — Telehealth (INDEPENDENT_AMBULATORY_CARE_PROVIDER_SITE_OTHER): Payer: Self-pay | Admitting: Internal Medicine

## 2020-04-04 NOTE — Telephone Encounter (Signed)
Mother called regarding test results - ph# [phone]

## 2020-04-05 NOTE — Telephone Encounter (Signed)
Per Dr.Rehman patient 's Mom was called and given results of the [place].Surgical consult is recommended.  Alexandra Kelley , (MOM) will call after talking with her husband about what surgeon they would like consult to be sent.

## 2020-04-05 NOTE — Telephone Encounter (Signed)
Dr.Rehman was made aware and will call the patient's Mom.

## 2020-04-07 ENCOUNTER — Other Ambulatory Visit (HOSPITAL_COMMUNITY): Payer: Self-pay

## 2020-04-07 ENCOUNTER — Other Ambulatory Visit: Payer: Self-pay

## 2020-04-08 ENCOUNTER — Ambulatory Visit (INDEPENDENT_AMBULATORY_CARE_PROVIDER_SITE_OTHER): Payer: Medicaid Other | Admitting: Gastroenterology

## 2020-04-08 ENCOUNTER — Other Ambulatory Visit: Payer: Self-pay

## 2020-04-09 ENCOUNTER — Telehealth (INDEPENDENT_AMBULATORY_CARE_PROVIDER_SITE_OTHER): Payer: Self-pay | Admitting: *Deleted

## 2020-04-09 NOTE — Telephone Encounter (Signed)
Patient's mother called back to let us know that they wiould like a surgical consult with Dr.Bridges in [place].  Patient recently had a study that showed she had Gallstones.(Epic) Will you let her Mother know when you have sent to referral?

## 2020-04-09 NOTE — Telephone Encounter (Signed)
Referral placed, patient's mom aware

## 2020-04-18 ENCOUNTER — Ambulatory Visit (INDEPENDENT_AMBULATORY_CARE_PROVIDER_SITE_OTHER): Payer: Medicaid Other | Admitting: General Surgery

## 2020-04-18 ENCOUNTER — Encounter: Payer: Self-pay | Admitting: General Surgery

## 2020-04-18 ENCOUNTER — Other Ambulatory Visit: Payer: Self-pay

## 2020-04-18 VITALS — BP 104/72 | HR 103 | Temp 98.6°F | Resp 16 | Ht 60.0 in | Wt 276.0 lb

## 2020-04-18 DIAGNOSIS — K8020 Calculus of gallbladder without cholecystitis without obstruction: Secondary | ICD-10-CM | POA: Diagnosis not present

## 2020-04-18 NOTE — Progress Notes (Signed)
[facility] History and Physical  Reason for Referral: Gallstones  Referring Physician:  Dr. Laural Golden   Chief Complaint    New pt - referred for gall stones      Alexandra Kelley is a 39 y.o. female.  HPI:  Alexandra Kelley is a 39 yo who comes in with complaints of upper abdominal pain on both the right and left side for some time. She is morbidly obese and has a history of DM, thyroid disease, and Down Syndrome. She is here today with her mom. She reports that her pain also radiates to her back at times. 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  Dr. Laural Golden obtained a CT due to concern that her pain could be related to pancreatitis or divisum, and CT demonstrated a large liver and renal calculi.  She had an Ultrasound following this with gallstones and no signs of cholecystitis.  The patient and mom are unable to give any history of pain caused by food intake specifically. The episodes sound rather sporadic with the mother noting an episode in December 2019 when she went to the ED, and then one back in the winter of this year.   Past Medical History:  Diagnosis Date  . Carpal tunnel syndrome    both wrists  . Diabetes mellitus without complication ([place])    Dr. Bubba Camp  . GERD (gastroesophageal reflux disease)   . Kidney stone   . Neuropathy   . Thyroid disease     Past Surgical History:  Procedure Laterality Date  . BACK SURGERY    . FEMUR IM NAIL Left 10/03/2013   Procedure: INTRAMEDULLARY (IM) RETROGRADE FEMORAL NAILING;  Surgeon: Marybelle Killings, MD;  Location: [facility];  Service: Orthopedics;  Laterality: Left;  . tubes in ears      Family History  Problem Relation Age of Onset  . Thyroid disease Mother   . Kidney Stones Father   . Heart attack Paternal Grandmother   . Thyroid disease Maternal Grandmother   . Other Maternal Grandmother        heart issue  . Heart attack Maternal Grandfather   .  Diabetes Maternal Grandfather   . Colon cancer Maternal Grandfather   . Cancer Maternal Grandfather        prostate    Social History   Tobacco Use  . Smoking status: Never Smoker  . Smokeless tobacco: Never Used  Vaping Use  . Vaping Use: Never used  Substance Use Topics  . Alcohol use: No  . Drug use: No    Medications: I have reviewed the patient's current medications. Allergies as of 04/18/2020      Reactions   Ceclor [cefaclor]    Rash   Invokana [canagliflozin]    Severe yeast infections   Lyrica [pregabalin] Itching   Metformin And Related    GI distress   Onglyza [saxagliptin]    headaches   Zofran [ondansetron Hcl] Other (See Comments)   Itchy rash in mouth   Penicillins Rash   Has patient had a PCN reaction causing immediate rash, facial/tongue/throat swelling, SOB or lightheadedness with hypotension: No Has patient had a PCN reaction causing severe rash involving mucus membranes or skin necrosis: No Has patient had a PCN reaction that required hospitalization: No Has patient had a PCN reaction occurring within the last 10 years: Yes If all of the above answers are "NO", then may proceed with  Cephalosporin use.      Medication List       Accurate as of April 18, 2020 11:59 PM. If you have any questions, ask your nurse or doctor.        dicyclomine 10 MG capsule Commonly known as: BENTYL Take 1 capsule (10 mg total) by mouth 3 (three) times daily as needed for spasms. What changed: when to take this   gabapentin 300 MG capsule Commonly known as: NEURONTIN Take 300 mg by mouth 3 (three) times daily.   insulin aspart 100 UNIT/ML injection Commonly known as: novoLOG Inject into the skin 2 (two) times daily. 25 units in the morning , 20 at night   levothyroxine 200 MCG tablet Commonly known as: SYNTHROID Take 175 mcg by mouth daily before breakfast.   nortriptyline 25 MG capsule Commonly known as: PAMELOR Take 25 mg by mouth at bedtime.     nystatin cream Commonly known as: MYCOSTATIN Apply 1 application topically 2 (two) times daily.   pantoprazole 40 MG tablet Commonly known as: PROTONIX TAKE 1 TABLET BY MOUTH EVERY DAY   silver sulfADIAZINE 1 % cream Commonly known as: SILVADENE Apply 1 application topically daily.   traMADol 50 MG tablet Commonly known as: ULTRAM TAKE 1 TABLET BY MOUTH EVERY 12 HOURS AS NEEDED What changed: when to take this        ROS:  A comprehensive review of systems was negative except for: Gastrointestinal: positive for abdominal pain, diarrhea and epigastric pain around to left and right Musculoskeletal: positive for back pain and joint pain Endocrine: positive for diabetes, tired and sluggish  Blood pressure 104/72, pulse (!) 103, temperature 98.6 F (37 C), temperature source Oral, resp. rate 16, height 5' (1.524 m), weight 276 lb (125.2 kg), SpO2 96 %. Physical Exam Vitals reviewed.  Constitutional:      Appearance: She is obese.  HENT:     Head: Normocephalic and atraumatic.     Nose: Nose normal.     Mouth/Throat:     Mouth: Mucous membranes are moist.  Eyes:     Extraocular Movements: Extraocular movements intact.     Pupils: Pupils are equal, round, and reactive to light.  Cardiovascular:     Rate and Rhythm: Normal rate and regular rhythm.  Pulmonary:     Effort: Pulmonary effort is normal.     Breath sounds: Normal breath sounds.  Abdominal:     General: There is no distension.     Palpations: Abdomen is soft.     Tenderness: There is generalized abdominal tenderness.     Hernia: A hernia is present. Hernia is present in the ventral area.     Comments: Large abdominal panus, Large fascia defect, small easily reducible   Musculoskeletal:        General: Swelling present. Normal range of motion.     Cervical back: Normal range of motion.  Skin:    General: Skin is warm.  Neurological:     General: No focal deficit present.     Mental Status: She is alert.  Mental status is at baseline.  Psychiatric:        Mood and Affect: Mood normal.        Behavior: Behavior normal.     Results: personally reviewed imaging- gallbladder distended on CT, large ventral hernia on prior CT with pelvis 7X8cm, [place] with stones   CLINICAL DATA:  Epigastric abdominal pain.  EXAM: ABDOMEN ULTRASOUND COMPLETE  COMPARISON:  CT 03/22/2020  FINDINGS: Gallbladder:  Multiple gallstones within the gallbladder, the largest approximately 5 mm. No wall thickening or surrounding fluid. No Murphy sign.  Common bile duct: Diameter: 5.5 mm, upper limits of normal.  Liver: Increased echogenicity suggesting fatty change. No focal lesion. Portal vein is patent on color Doppler imaging with normal direction of blood flow towards the liver.  IVC: No abnormality visualized.  Pancreas: Visualized portion unremarkable.  Spleen: Size and appearance within normal limits.  Right Kidney: Length: 13.9 cm. Multiple nonobstructing calculi. No mass lesion.  Left Kidney: Length: 11.7 cm. Nonobstructing mid pole calculus. No mass lesion.  Abdominal aorta: No aneurysm visualized.  Other findings: None.  IMPRESSION: Chololithiasis without ultrasound evidence of cholecystitis or obstruction.  Fatty change of the liver.  Nonobstructing renal calculi, more numerous on the right than the left.   Electronically Signed   By: Paulina Fusi M.D.   On: 04/02/2020 14:45  CLINICAL DATA:  Epigastric abdominal pain x2 months  EXAM: CT ABDOMEN WITHOUT AND WITH CONTRAST  TECHNIQUE: Multidetector CT imaging of the abdomen was performed following the standard protocol before and following the bolus administration of intravenous contrast.  CONTRAST:  OMNIPAQUE IOHEXOL 300 MG/ML  SOLN  COMPARISON:  10/17/2018  FINDINGS: Lower chest: Lung bases are clear.  Hepatobiliary: Moderate hepatic steatosis. Riedel's lobe configuration with prominent  inferior right hepatic lobe and small left hepatic lobe.  Gallbladder is unremarkable. No intrahepatic or extrahepatic ductal dilatation.  Pancreas: Within normal limits.  Spleen: At the upper limits of normal for size, measuring 13.8 cm in craniocaudal dimension.  Adrenals/Urinary Tract: Adrenal glands are within normal limits.  No enhancing renal lesions.  Five calculi in the right renal collecting system measuring up to 8 mm (coronal image 73). Additional 5 mm calculus in the right upper pole renal collecting system (coronal image 78) and two additional lower pole calculi measuring up to 6 mm (coronal image 69).  4 mm nonobstructing left lower pole renal calculus (series 2/image 35). No hydronephrosis.  Stomach/Bowel: Stomach is within normal limits.  Visualized bowel is unremarkable.  Vascular/Lymphatic: No evidence of abdominal aortic aneurysm.  Atherosclerotic calcifications of the abdominal aorta and branch vessels.  No suspicious abdominal lymphadenopathy.  Other: No abdominal ascites.  Musculoskeletal: Degenerative changes of the visualized thoracolumbar spine with lumbar dextroscoliosis.  IMPRESSION: Multiple nonobstructing bilateral renal calculi, measuring up to 8 mm in the right renal collecting system. No hydronephrosis.  Otherwise, no CT findings to account for the patient's epigastric abdominal pain. Please note that the pelvis was not imaged.  Moderate hepatic steatosis. Prominence of the right hepatic lobe favors a Riedel's lobe configuration (normal variant) rather than true hepatomegaly.   Electronically Signed   By: Charline Bills M.D.   On: 03/23/2020 08:48  Assessment & Plan:  LORIE CLECKLEY is a 39 y.o. female with gallstones but pain that is bilateral and really difficult to know if this is from gallbladder as it could be muscular from the panus and weight of the panus versus something with the stomach given  the bilateral nature. I have discussed this with the patient and her mother. She is at a greater risk of surgical complications and anesthesia complications due to her obesity and large central obesity which makes intubation and ventilation more difficult. Discussed that surgery may not help with any of the symptoms, and we need to really think about the risk and benefit of the procedure.   -Will see patient back in 1 month, and asked them to document  eating habits/ activities when the pain occurs -Mom and patient in agreement with this plan.    Future Appointments  Date Time Provider Department Center  05/16/2020  2:30 PM Lucretia Roers, MD [facility] None  05/17/2020  2:30 PM Bjorn Pippin, MD [facility] None     All questions were answered to the satisfaction of the patient and family.   Lucretia Roers 04/19/2020, 9:12 AM

## 2020-04-18 NOTE — Patient Instructions (Signed)
Cholelithiasis  Cholelithiasis is also called "gallstones." It is a kind of gallbladder disease. The gallbladder is an organ that stores a liquid (bile) that helps you digest fat. Gallstones may not cause symptoms (may be silent gallstones) until they cause a blockage, and then they can cause pain (gallbladder attack). Follow these instructions at home:  Take over-the-counter and prescription medicines only as told by your doctor.  Stay at a healthy weight.  Eat healthy foods. This includes: ? Eating fewer fatty foods, like fried foods. ? Eating fewer refined carbs (refined carbohydrates). Refined carbs are breads and grains that are highly processed, like white bread and white rice. Instead, choose whole grains like whole-wheat bread and brown rice. ? Eating more fiber. Almonds, fresh fruit, and beans are healthy sources of fiber.  Keep all follow-up visits as told by your doctor. This is important. Contact a doctor if:  You have sudden pain in the upper right side of your belly (abdomen). Pain might spread to your right shoulder or your chest. This may be a sign of a gallbladder attack.  You feel sick to your stomach (are nauseous).  You throw up (vomit).  You have been diagnosed with gallstones that have no symptoms and you get: ? Belly pain. ? Discomfort, burning, or fullness in the upper part of your belly (indigestion). Get help right away if:  You have sudden pain in the upper right side of your belly, and it lasts for more than 2 hours.  You have belly pain that lasts for more than 5 hours.  You have a fever or chills.  You keep feeling sick to your stomach or you keep throwing up.  Your skin or the whites of your eyes turn yellow (jaundice).  You have dark-colored pee (urine).  You have light-colored poop (stool). Summary  Cholelithiasis is also called "gallstones."  The gallbladder is an organ that stores a liquid (bile) that helps you digest fat.  Silent  gallstones are gallstones that do not cause symptoms.  A gallbladder attack may cause sudden pain in the upper right side of your belly. Pain might spread to your right shoulder or your chest. If this happens, contact your doctor.  If you have sudden pain in the upper right side of your belly that lasts for more than 2 hours, get help right away. This information is not intended to replace advice given to you by your health care provider. Make sure you discuss any questions you have with your health care provider. Document Revised: 10/01/2017 Document Reviewed: 07/05/2016 Elsevier Patient Education  2020 Else[employer] Inc.    Laparoscopic Cholecystectomy Laparoscopic cholecystectomy is surgery to remove the gallbladder. The gallbladder is a pear-shaped organ that lies beneath the liver on the right side of the body. The gallbladder stores bile, which is a fluid that helps the body to digest fats. Cholecystectomy is often done for inflammation of the gallbladder (cholecystitis). This condition is usually caused by a buildup of gallstones (cholelithiasis) in the gallbladder. Gallstones can block the flow of bile, which can result in inflammation and pain. In severe cases, emergency surgery may be required. This procedure is done though small incisions in your abdomen (laparoscopic surgery). A thin scope with a camera (laparoscope) is inserted through one incision. Thin surgical instruments are inserted through the other incisions. In some cases, a laparoscopic procedure may be turned into a type of surgery that is done through a larger incision (open surgery). Tell a health care provider about:  Any   allergies you have.  All medicines you are taking, including vitamins, herbs, eye drops, creams, and over-the-counter medicines.  Any problems you or family members have had with anesthetic medicines.  Any blood disorders you have.  Any surgeries you have had.  Any medical conditions you  have.  Whether you are pregnant or may be pregnant. What are the risks? Generally, this is a safe procedure. However, problems may occur, including:  Infection.  Bleeding.  Allergic reactions to medicines.  Damage to other structures or organs.  A stone remaining in the common bile duct. The common bile duct carries bile from the gallbladder into the small intestine.  A bile leak from the cyst duct that is clipped when your gallbladder is removed. Medicines  Ask your health care provider about: ? Changing or stopping your regular medicines. This is especially important if you are taking diabetes medicines or blood thinners. ? Taking medicines such as aspirin and ibuprofen. These medicines can thin your blood. Do not take these medicines before your procedure if your health care provider instructs you not to.  You may be given antibiotic medicine to help prevent infection. General instructions  Let your health care provider know if you develop a cold or an infection before surgery.  Plan to have someone take you home from the hospital or clinic.  Ask your health care provider how your surgical site will be marked or identified. What happens during the procedure?   To reduce your risk of infection: ? Your health care team will wash or sanitize their hands. ? Your skin will be washed with soap. ? Hair may be removed from the surgical area.  An IV tube may be inserted into one of your veins.  You will be given one or more of the following: ? A medicine to help you relax (sedative). ? A medicine to make you fall asleep (general anesthetic).  A breathing tube will be placed in your mouth.  Your surgeon will make several small cuts (incisions) in your abdomen.  The laparoscope will be inserted through one of the small incisions. The camera on the laparoscope will send images to a TV screen (monitor) in the operating room. This lets your surgeon see inside your  abdomen.  Air-like gas will be pumped into your abdomen. This will expand your abdomen to give the surgeon more room to perform the surgery.  Other tools that are needed for the procedure will be inserted through the other incisions. The gallbladder will be removed through one of the incisions.  Your common bile duct may be examined. If stones are found in the common bile duct, they may be removed.  After your gallbladder has been removed, the incisions will be closed with stitches (sutures), staples, or skin glue.  Your incisions may be covered with a bandage (dressing). The procedure may vary among health care providers and hospitals. What happens after the procedure?  Your blood pressure, heart rate, breathing rate, and blood oxygen level will be monitored until the medicines you were given have worn off.  You will be given medicines as needed to control your pain.  Do not drive for 24 hours if you were given a sedative. This information is not intended to replace advice given to you by your health care provider. Make sure you discuss any questions you have with your health care provider. Document Revised: 10/01/2017 Document Reviewed: 04/06/2016 Elsevier Patient Education  2020 Elsevier Inc.  

## 2020-05-16 ENCOUNTER — Ambulatory Visit: Payer: Medicaid Other | Admitting: General Surgery

## 2020-05-17 ENCOUNTER — Encounter: Payer: Self-pay | Admitting: Urology

## 2020-05-17 ENCOUNTER — Ambulatory Visit: Payer: Medicaid Other | Admitting: Urology

## 2020-05-17 ENCOUNTER — Other Ambulatory Visit: Payer: Self-pay

## 2020-05-17 ENCOUNTER — Other Ambulatory Visit (HOSPITAL_COMMUNITY)
Admission: AD | Admit: 2020-05-17 | Discharge: 2020-05-17 | Disposition: A | Discharge status: Home / Self Care | Payer: Medicaid Other | Source: Skilled Nursing Facility | Attending: Urology | Admitting: Urology

## 2020-05-17 VITALS — BP 102/60 | HR 113 | Temp 99.3°F | Ht 60.0 in | Wt 293.0 lb

## 2020-05-17 DIAGNOSIS — N200 Calculus of kidney: Secondary | ICD-10-CM

## 2020-05-17 LAB — POCT URINALYSIS DIPSTICK
Blood, UA: NEGATIVE
Glucose, UA: NEGATIVE
Ketones, UA: NEGATIVE
Nitrite, UA: NEGATIVE
Protein, UA: NEGATIVE
Spec Grav, UA: >=1.03 — AB (ref 1.010–1.025)
Urobilinogen, UA: NEGATIVE E.U./dL — AB
pH, UA: 5 (ref 5.0–8.0)

## 2020-05-17 LAB — URINALYSIS, COMPLETE (UACMP) WITH MICROSCOPIC
Bilirubin Urine: NEGATIVE
Glucose, UA: 50 mg/dL — AB
Ketones, ur: NEGATIVE mg/dL
Nitrite: NEGATIVE
Protein, ur: NEGATIVE mg/dL
Specific Gravity, Urine: 1.019 (ref 1.005–1.030)
pH: 5 (ref 5.0–8.0)

## 2020-05-17 NOTE — Progress Notes (Signed)
Urological Symptom Review  Patient is experiencing the following symptoms: Kidney stone  Review of Systems  Gastrointestinal (upper)  : Negative for upper GI symptoms  Gastrointestinal (lower) : Negative for lower GI symptoms  Constitutional : Negative for symptoms  Skin: Negative for skin symptoms  Eyes: Negative for eye symptoms  Ear/Nose/Throat : Negative for Ear/Nose/Throat symptoms  Hematologic/Lymphatic: Negative for Hematologic/Lymphatic symptoms  Cardiovascular : Negative for cardiovascular symptoms  Respiratory : Negative for respiratory symptoms  Endocrine: Negative for endocrine symptoms  Musculoskeletal: Negative for musculoskeletal symptoms  Neurological: Negative for neurological symptoms  Psychologic: Negative for psychiatric symptoms  

## 2020-05-17 NOTE — Assessment & Plan Note (Signed)
She has bilateral renal stones with minimal burden on the left but multiple right renal stones, at least 10 on my count, on the right.   The stones are mostly about 4-48mm but appear larger in size than on prior imaging.  There is no obstruction but I feel she needs to be treated and reviewed the options of PCNL, ESWL and Ureteroscopy.  Because of her size, I think the PCNL and ESWL would be problematic and I would prefer to refer to [facility] for a PCNL.  They are most interested in Ureteroscopy with the understanding that she may require multiple procedures and will definitely need a stent.  The risks of bleeding, infection, ureteral injury, need to stent first and then return for secondary procedures, thrombotic events and anesthetic complications were reviewed.

## 2020-05-17 NOTE — Progress Notes (Signed)
Subjective:  1. Bilateral renal stones   2. Renal stones       CC: I have kidney stones.  HPI: Alexandra Kelley is a 39 year-old female established patient who is here for renal calculi.  Alexandra Kelley returns today in f/u at the request of Dr. Karilyn Cota for the finding of progressive right renal stone disease on recetn abdominal CT for epigastric pain.   She is known to have multiple right renal stones and a few small left renal stones.   She has had no flank pain or hematuria.  She has had no UTI's.  She has diabetes and is now on Novolog.  She is very obese.   She has no voiding complaints.       ROS:  ROS:  A complete review of systems was performed.  All systems are negative except for pertinent findings as noted.   ROS  Allergies  Allergen Reactions  . Ceclor [Cefaclor]     Rash  . Invokana [Canagliflozin]     Severe yeast infections  . Lyrica [Pregabalin] Itching  . Metformin And Related     GI distress  . Onglyza [Saxagliptin]     headaches  . Zofran [Ondansetron Hcl] Other (See Comments)    Itchy rash in mouth  . Penicillins Rash    Has patient had a PCN reaction causing immediate rash, facial/tongue/throat swelling, SOB or lightheadedness with hypotension: No Has patient had a PCN reaction causing severe rash involving mucus membranes or skin necrosis: No Has patient had a PCN reaction that required hospitalization: No Has patient had a PCN reaction occurring within the last 10 years: Yes If all of the above answers are "NO", then may proceed with Cephalosporin use.     Outpatient Encounter Medications as of 05/17/2020  Medication Sig Note  . dicyclomine (BENTYL) 10 MG capsule Take 1 capsule (10 mg total) by mouth 3 (three) times daily as needed for spasms. (Patient taking differently: Take 10 mg by mouth as needed for spasms. )   . gabapentin (NEURONTIN) 300 MG capsule Take 300 mg by mouth 3 (three) times daily. 05/07/2016: Received from: External Pharmacy  . insulin  aspart (NOVOLOG) 100 UNIT/ML injection Inject into the skin 2 (two) times daily. 25 units in the morning , 20 at night   . levothyroxine (SYNTHROID, LEVOTHROID) 200 MCG tablet Take 175 mcg by mouth daily before breakfast.    . nortriptyline (PAMELOR) 25 MG capsule Take 25 mg by mouth at bedtime.   Marland Kitchen nystatin cream (MYCOSTATIN) Apply 1 application topically 2 (two) times daily.   . pantoprazole (PROTONIX) 40 MG tablet TAKE 1 TABLET BY MOUTH EVERY DAY   . silver sulfADIAZINE (SILVADENE) 1 % cream Apply 1 application topically daily. 01/01/2020: As needed.  . traMADol (ULTRAM) 50 MG tablet TAKE 1 TABLET BY MOUTH EVERY 12 HOURS AS NEEDED (Patient taking differently: Take 50 mg by mouth 2 (two) times daily. )    No facility-administered encounter medications on file as of 05/17/2020.    Past Medical History:  Diagnosis Date  . Carpal tunnel syndrome    both wrists  . Diabetes mellitus without complication (HCC)    Dr. Lurene Shadow  . GERD (gastroesophageal reflux disease)   . Kidney stone   . Neuropathy   . Thyroid disease     Past Surgical History:  Procedure Laterality Date  . BACK SURGERY    . FEMUR IM NAIL Left 10/03/2013   Procedure: INTRAMEDULLARY (IM) RETROGRADE FEMORAL NAILING;  Surgeon: Loraine Leriche  Becky Sax, MD;  Location: [facility] OR;  Service: Orthopedics;  Laterality: Left;  . tubes in ears      Social History   Socioeconomic History  . Marital status: Single    Spouse name: Not on file  . Number of children: Not on file  . Years of education: 64  . Highest education level: Not on file  Occupational History  . Not on file  Tobacco Use  . Smoking status: Never Smoker  . Smokeless tobacco: Never Used  Vaping Use  . Vaping Use: Never used  Substance and Sexual Activity  . Alcohol use: No  . Drug use: No  . Sexual activity: Never    Birth control/protection: None  Other Topics Concern  . Not on file  Social History Narrative   Patient lives at home with her parents [employer] and [place].    Patient has a high school certificate.   Right handed.   Caffeine coke cola sometimes.            Social Determinants of Health   Financial Resource Strain:   . Difficulty of Paying Living Expenses:   Food Insecurity:   . Worried About Programme researcher, broadcasting/film/video in the Last Year:   . Barista in the Last Year:   Transportation Needs:   . Freight forwarder (Medical):   Marland Kitchen Lack of Transportation (Non-Medical):   Physical Activity:   . Days of Exercise per Week:   . Minutes of Exercise per Session:   Stress:   . Feeling of Stress :   Social Connections:   . Frequency of Communication with Friends and Family:   . Frequency of Social Gatherings with Friends and Family:   . Attends Religious Services:   . Active Member of Clubs or Organizations:   . Attends Banker Meetings:   Marland Kitchen Marital Status:   Intimate Partner Violence:   . Fear of Current or Ex-Partner:   . Emotionally Abused:   Marland Kitchen Physically Abused:   . Sexually Abused:     Family History  Problem Relation Age of Onset  . Thyroid disease Mother   . Kidney Stones Father   . Heart attack Paternal Grandmother   . Thyroid disease Maternal Grandmother   . Other Maternal Grandmother        heart issue  . Heart attack Maternal Grandfather   . Diabetes Maternal Grandfather   . Colon cancer Maternal Grandfather   . Cancer Maternal Grandfather        prostate       Objective: Vitals:   05/17/20 1438  BP: 102/60  Pulse: (!) 113  Temp: 99.3 F (37.4 C)     Physical Exam Vitals reviewed.  Constitutional:      Appearance: She is obese.  HENT:     Head:     Comments: Down's fascies Cardiovascular:     Rate and Rhythm: Normal rate and regular rhythm.     Heart sounds: Normal heart sounds.  Pulmonary:     Effort: Pulmonary effort is normal. No respiratory distress.     Breath sounds: Normal breath sounds.  Abdominal:     Comments: Morbidly obese without tenderness.   Musculoskeletal:         General: No swelling or tenderness. Normal range of motion.  Skin:    General: Skin is warm and dry.  Neurological:     Mental Status: She is alert.     Lab Results:  Results  for orders placed or performed in visit on 05/17/20 (from the past 24 hour(s))  POCT urinalysis dipstick     Status: Abnormal   Collection Time: 05/17/20  2:45 PM  Result Value Ref Range   Color, UA yellow    Clarity, UA clear    Glucose, UA Negative Negative   Bilirubin, UA moderate    Ketones, UA neg    Spec Grav, UA >=1.030 (A) 1.010 - 1.025   Blood, UA neg    pH, UA 5.0 5.0 - 8.0   Protein, UA Negative Negative   Urobilinogen, UA negative (A) 0.2 or 1.0 E.U./dL   Nitrite, UA neg    Leukocytes, UA Moderate (2+) (A) Negative   Appearance     Odor      BMET No results for input(s): NA, K, CL, CO2, GLUCOSE, BUN, CREATININE, CALCIUM in the last 72 hours. PSA No results found for: PSA No results found for: TESTOSTERONE    Studies/Results: I have reviewed her last several CT scans.  There were no stones in 2010 and have progressively increased particularly over the last couple of years.     Assessment & Plan: Renal stones She has bilateral renal stones with minimal burden on the left but multiple right renal stones, at least 10 on my count, on the right.   The stones are mostly about 4-70mm but appear larger in size than on prior imaging.  There is no obstruction but I feel she needs to be treated and reviewed the options of PCNL, ESWL and Ureteroscopy.  Because of her size, I think the PCNL and ESWL would be problematic and I would prefer to refer to [facility] for a PCNL.  They are most interested in Ureteroscopy with the understanding that she may require multiple procedures and will definitely need a stent.  The risks of bleeding, infection, ureteral injury, need to stent first and then return for secondary procedures, thrombotic events and anesthetic complications were reviewed.     No orders of the  defined types were placed in this encounter.    Orders Placed This Encounter  Procedures  . Urine Culture    Standing Status:   Future    Standing Expiration Date:   06/17/2020  . Urinalysis, Routine w reflex microscopic    Standing Status:   Future    Standing Expiration Date:   06/17/2020  . POCT urinalysis dipstick      Return for Schedule ureteroscopy. .   CC: Marylynn Pearson, FNP  And Dr. Lionel December.    Bjorn Pippin 05/17/2020

## 2020-05-19 LAB — URINE CULTURE

## 2020-05-21 NOTE — Progress Notes (Signed)
Dr. Henreitta Leber note appreciated. I agree that assessment is difficult given patient's history of Down syndrome. Will wait for follow-up visit with her.

## 2020-05-22 ENCOUNTER — Ambulatory Visit (INDEPENDENT_AMBULATORY_CARE_PROVIDER_SITE_OTHER): Payer: Medicaid Other | Admitting: General Surgery

## 2020-05-22 ENCOUNTER — Other Ambulatory Visit: Payer: Self-pay

## 2020-05-22 ENCOUNTER — Other Ambulatory Visit: Payer: Self-pay | Admitting: Urology

## 2020-05-22 ENCOUNTER — Encounter: Payer: Self-pay | Admitting: General Surgery

## 2020-05-22 VITALS — BP 118/75 | HR 103 | Temp 99.0°F | Resp 16 | Ht 60.0 in | Wt 278.0 lb

## 2020-05-22 DIAGNOSIS — K8020 Calculus of gallbladder without cholecystitis without obstruction: Secondary | ICD-10-CM | POA: Diagnosis not present

## 2020-05-22 NOTE — Progress Notes (Signed)
[facility] Note   HPI:  39 y.o. Female presents to clinic for follow-up evaluation regarding her upper abdominal pain. She says this has improved. She has been trying to eat better and she says she feels better.   Review of Systems:  Less upper bilateral abdominal pain All other review of systems: otherwise negative   Vital Signs:  BP 118/75   Pulse (!) 103   Temp 99 F (37.2 C) (Oral)   Resp 16   Ht 5' (1.524 m)   Wt 278 lb (126.1 kg)   SpO2 95%   BMI 54.29 kg/m    Physical Exam:  Physical Exam Vitals reviewed.  HENT:     Head: Normocephalic.  Cardiovascular:     Rate and Rhythm: Normal rate.  Pulmonary:     Effort: Pulmonary effort is normal.  Abdominal:     Palpations: Abdomen is soft.  Neurological:     Mental Status: She is alert.      Assessment:  39 y.o. yo Female with gallstones but no clear evidence that they are symptomatic at this time. She has a large panus and a large ventral hernia. Her surgery would be difficult and getting anesthesia would be risky due to her obesity. At this time we are holding on cholecystectomy.  Plan:  - Continue eating plan  - Follow up PRN  All of the above recommendations were discussed with the patient and patient's family, and all of patient's and family's questions were answered to their expressed satisfaction.  Algis Greenhouse, MD [facility] [address] [place]ella R[place], [place] [address] [phone] (office)

## 2020-05-22 NOTE — Patient Instructions (Signed)
Gallbladder Eating Plan If you have a gallbladder condition, you may have trouble digesting fats. Eating a low-fat diet can help reduce your symptoms, and may be helpful before and after having surgery to remove your gallbladder (cholecystectomy). Your health care provider may recommend that you work with a diet and nutrition specialist (dietitian) to help you reduce the amount of fat in your diet. What are tips for following this plan? General guidelines  Limit your fat intake to less than 30% of your total daily calories. If you eat around 1,800 calories each day, this is less than 60 grams (g) of fat per day.  Fat is an important part of a healthy diet. Eating a low-fat diet can make it hard to maintain a healthy body weight. Ask your dietitian how much fat, calories, and other nutrients you need each day.  Eat small, frequent meals throughout the day instead of three large meals.  Drink at least 8-10 cups of fluid a day. Drink enough fluid to keep your urine clear or pale yellow.  Limit alcohol intake to no more than 1 drink a day for nonpregnant women and 2 drinks a day for men. One drink equals 12 oz of beer, 5 oz of wine, or 1 oz of hard liquor. Reading food labels  Check Nutrition Facts on food labels for the amount of fat per serving. Choose foods with less than 3 grams of fat per serving. Shopping  Choose nonfat and low-fat healthy foods. Look for the words "nonfat," "low fat," or "fat free."  Avoid buying processed or prepackaged foods. Cooking  Cook using low-fat methods, such as baking, broiling, grilling, or boiling.  Cook with small amounts of healthy fats, such as olive oil, grapeseed oil, canola oil, or sunflower oil. What foods are recommended?   All fresh, frozen, or canned fruits and vegetables.  Whole grains.  Low-fat or non-fat (skim) milk and yogurt.  Lean meat, skinless poultry, fish, eggs, and beans.  Low-fat protein supplement powders or  drinks.  Spices and herbs. What foods are not recommended?  High-fat foods. These include baked goods, fast food, fatty cuts of meat, ice cream, french toast, sweet rolls, pizza, cheese bread, foods covered with butter, creamy sauces, or cheese.  Fried foods. These include french fries, tempura, battered fish, breaded chicken, fried breads, and sweets.  Foods with strong odors.  Foods that cause bloating and gas. Summary  A low-fat diet can be helpful if you have a gallbladder condition, or before and after gallbladder surgery.  Limit your fat intake to less than 30% of your total daily calories. This is about 60 g of fat if you eat 1,800 calories each day.  Eat small, frequent meals throughout the day instead of three large meals. This information is not intended to replace advice given to you by your health care provider. Make sure you discuss any questions you have with your health care provider. Document Revised: 02/09/2019 Document Reviewed: 11/26/2016 Elsevier Patient Education  2020 Elsevier Inc.  

## 2020-05-30 ENCOUNTER — Telehealth: Payer: Self-pay | Admitting: Adult Health

## 2020-05-30 MED ORDER — NYSTATIN 100000 UNIT/GM EX CREA: 1.0000 "application " | TOPICAL_CREAM | Freq: Two times a day (BID) | CUTANEOUS | 3 refills | Status: DC

## 2020-05-30 NOTE — Telephone Encounter (Signed)
Refill nystatin. 

## 2020-05-30 NOTE — Telephone Encounter (Signed)
Patient mother called and wants a nurse to give her a call, she didn't want to state the reason for the call.  She also stated if she do not answer he mobile to please call her work number at [phone].

## 2020-05-30 NOTE — Telephone Encounter (Signed)
Pt needs refills on Nystatin cream. Thanks!! JSY

## 2020-06-11 NOTE — Patient Instructions (Addendum)
DUE TO COVID-19 ONLY ONE VISITOR IS ALLOWED TO COME WITH YOU AND STAY IN THE WAITING ROOM ONLY DURING PRE OP AND PROCEDURE DAY OF SURGERY. THE 1 VISITOR  MAY VISIT WITH YOU AFTER SURGERY IN YOUR PRIVATE ROOM DURING VISITING HOURS ONLY!  YOU NEED TO HAVE A COVID 19 TEST ON_8/16______ @1 :30_______, THIS TEST MUST BE DONE BEFORE SURGERY,  COVID TESTING SITE [address] [place] , IT IS ON THE RIGHT GOING OUT [address] APPROXIMATELY  2 MINUTES PAST [employer] ON THE RIGHT. ONCE YOUR COVID TEST IS COMPLETED,  PLEASE BEGIN THE QUARANTINE INSTRUCTIONS AS OUTLINED IN YOUR HANDOUT.                Alexandra Kelley    Your procedure is scheduled on: 06/20/20   Report to [facility] Main  Entrance   Report to Short Stay at 5:30 AM     Call this number if you have problems the morning of surgery [phone]    Remember: Do not eat food or drink liquids :After Midnight  . BRUSH YOUR TEETH MORNING OF SURGERY AND RINSE YOUR MOUTH OUT, NO CHEWING GUM CANDY OR MINTS.     Take these medicines the morning of surgery with A SIP OF WATER: Levothyroxine, Pantoprazole  DO NOT TAKE ANY DIABETIC MEDICATIONS DAY OF YOUR SURGERY  How to Manage Your Diabetes Before and After Surgery  Why is it important to control my blood sugar before and after surgery?  Improving blood sugar levels before and after surgery helps healing and can limit problems.  A way of improving blood sugar control is eating a healthy diet by: o  Eating less sugar and carbohydrates o  Increasing activity/exercise o  Talking with your doctor about reaching your blood sugar goals  High blood sugars (greater than 180 mg/dL) can raise your risk of infections and slow your recovery, so you will need to focus on controlling your diabetes during the weeks before surgery.  Make sure that the doctor who takes care of your diabetes knows about your planned surgery including the date and  location.  How do I manage my blood sugar before surgery?  Check your blood sugar at least 4 times a day, starting 2 days before surgery, to make sure that the level is not too high or low. o Check your blood sugar the morning of your surgery when you wake up and every 2 hours until you get to the Short Stay unit.  If your blood sugar is less than 70 mg/dL, you will need to treat for low blood sugar: o Do not take insulin. o Treat a low blood sugar (less than 70 mg/dL) with  cup of clear juice (cranberry or apple), 4 glucose tablets, OR glucose gel. o Recheck blood sugar in 15 minutes after treatment (to make sure it is greater than 70 mg/dL). If your blood sugar is not greater than 70 mg/dL on recheck, call [phone] for further instructions.  Report your blood sugar to the short stay nurse when you get to Short Stay.   If you are admitted to the hospital after surgery: o Your blood sugar will be checked by the staff and you will probably be given insulin after surgery (instead of oral diabetes medicines) to make sure you have good blood sugar levels. o The goal for blood sugar control after surgery is 80-180 mg/dL.   WHAT DO I DO ABOUT MY DIABETES MEDICATION?   Do not  take oral diabetes medicines (pills) the morning of surgery.   THE NIGHT BEFORE SURGERY, take 21  units of Novolog 70/30  insulin.        THE MORNING OF SURGERY, take 0 units of  insulin.   The day of surgery, do not take other diabetes injectables, including Byetta (exenatide), Bydureon (exenatide ER), Victoza (liraglutide), or Trulicity (dulaglutide).              You may not have any metal on your body including hair pins and              piercings             Do not wear jewelry, make-up, lotions, powders or perfumes, deodorant             Do not wear nail polish on your fingernails.  Do not shave  48 hours prior to surgery.     Do not bring valuables to the hospital. [facility] IS NOT              RESPONSIBLE   FOR VALUABLES.  Contacts, dentures or bridgework may not be worn into surgery.      Patients discharged the day of surgery will not be allowed to drive home.   IF YOU ARE HAVING SURGERY AND GOING HOME THE SAME DAY, YOU MUST HAVE AN ADULT TO DRIVE YOU HOME AND BE WITH YOU FOR 24 HOURS.   YOU MAY GO HOME BY TAXI OR UBER OR ORTHERWISE, BUT AN ADULT MUST ACCOMPANY YOU HOME AND STAY WITH YOU FOR 24 HOURS.  Name and phone number of your driver:  Special Instructions: N/A              Please read over the following fact sheets you were given: _____________________________________________________________________             [facility] - Preparing for Surgery Before surgery, you can play an important role.   Because skin is not sterile, your skin needs to be as free of germs as possible.   You can reduce the number of germs on your skin by washing with CHG (chlorahexidine gluconate) soap before surgery.   CHG is an antiseptic cleaner which kills germs and bonds with the skin to continue killing germs even after washing. Please DO NOT use if you have an allergy to CHG or antibacterial soaps.   If your skin becomes reddened/irritated stop using the CHG and inform your nurse when you arrive at Short Stay. Do not shave (including legs and underarms) for at least 48 hours prior to the first CHG shower.    Please follow these instructions carefully:  1.  Shower with CHG Soap the night before surgery and the  morning of Surgery.  2.  If you choose to wash your hair, wash your hair first as usual with your  normal  shampoo.  3.  After you shampoo, rinse your hair and body thoroughly to remove the  shampoo.                                        4.  Use CHG as you would any other liquid soap.  You can apply chg directly  to the skin and wash                       Gently with a  scrungie or clean washcloth.  5.  Apply the CHG Soap to your body ONLY FROM THE NECK DOWN.   Do not use on  face/ open                           Wound or open sores. Avoid contact with eyes, ears mouth and genitals (private parts).                       Wash face,  Genitals (private parts) with your normal soap.             6.  Wash thoroughly, paying special attention to the area where your surgery  will be performed.  7.  Thoroughly rinse your body with warm water from the neck down.  8.  DO NOT shower/wash with your normal soap after using and rinsing off  the CHG Soap.             9.  Pat yourself dry with a clean towel.            10.  Wear clean pajamas.            11.  Place clean sheets on your bed the night of your first shower and do not  sleep with pets. Day of Surgery : Do not apply any lotions/deodorants the morning of surgery.  Please wear clean clothes to the hospital/surgery center.  FAILURE TO FOLLOW THESE INSTRUCTIONS MAY RESULT IN THE CANCELLATION OF YOUR SURGERY PATIENT SIGNATURE_________________________________  NURSE SIGNATURE__________________________________  ________________________________________________________________________

## 2020-06-12 ENCOUNTER — Encounter (HOSPITAL_COMMUNITY)
Admission: RE | Admit: 2020-06-12 | Discharge: 2020-06-12 | Disposition: A | Discharge status: Home / Self Care | Payer: Medicaid Other | Source: Ambulatory Visit | Attending: Urology | Admitting: Urology

## 2020-06-12 ENCOUNTER — Encounter (HOSPITAL_COMMUNITY): Payer: Self-pay

## 2020-06-12 ENCOUNTER — Other Ambulatory Visit: Payer: Self-pay

## 2020-06-12 DIAGNOSIS — Z01812 Encounter for preprocedural laboratory examination: Secondary | ICD-10-CM | POA: Insufficient documentation

## 2020-06-12 HISTORY — DX: Hypothyroidism, unspecified: E03.9

## 2020-06-12 HISTORY — DX: Unspecified osteoarthritis, unspecified site: M19.90

## 2020-06-12 HISTORY — DX: Personal history of urinary calculi: Z87.442

## 2020-06-12 LAB — BASIC METABOLIC PANEL WITH GFR
Anion gap: 10 (ref 5–15)
BUN: 10 mg/dL (ref 6–20)
CO2: 30 mmol/L (ref 22–32)
Calcium: 9.1 mg/dL (ref 8.9–10.3)
Chloride: 96 mmol/L — ABNORMAL LOW (ref 98–111)
Creatinine, Ser: 0.7 mg/dL (ref 0.44–1.00)
GFR calc Af Amer: >60 mL/min (ref >60)
GFR calc non Af Amer: >60 mL/min (ref >60)
Glucose, Bld: 200 mg/dL — ABNORMAL HIGH (ref 70–99)
Potassium: 4 mmol/L (ref 3.5–5.1)
Sodium: 136 mmol/L (ref 135–145)

## 2020-06-12 LAB — GLUCOSE, CAPILLARY: Glucose-Capillary: 200 mg/dL — ABNORMAL HIGH (ref 70–99)

## 2020-06-12 LAB — CBC
HCT: 41.3 % (ref 36.0–46.0)
Hemoglobin: 13.2 g/dL (ref 12.0–15.0)
MCH: 26.9 pg (ref 26.0–34.0)
MCHC: 32 g/dL (ref 30.0–36.0)
MCV: 84.3 fL (ref 80.0–100.0)
Platelets: 248 10*3/uL (ref 150–400)
RBC: 4.9 MIL/uL (ref 3.87–5.11)
RDW: 14.7 % (ref 11.5–15.5)
WBC: 8.4 10*3/uL (ref 4.0–10.5)
nRBC: 0 % (ref 0.0–0.2)

## 2020-06-12 LAB — HEMOGLOBIN A1C
Hgb A1c MFr Bld: 8.7 % — ABNORMAL HIGH (ref 4.8–5.6)
Mean Plasma Glucose: 202.99 mg/dL

## 2020-06-12 NOTE — Progress Notes (Signed)
COVID Vaccine Completed:No Date COVID Vaccine completed: COVID vaccine manufacturer: Pfizer    Moderna   [employer]   PCP - Marylynn Pearson Cardiologist - no  Chest x-ray - no EKG - 04/08/20 Stress Test - no ECHO - no Cardiac Cath - no  Sleep Study - no CPAP -   Fasting Blood Sugar - 150-176 Checks Blood Sugar BID_____ times a day  Blood Thinner Instructions:NA Aspirin Instructions: Last Dose:  Anesthesia review:   Patient denies shortness of breath, fever, cough and chest pain at PAT appointment yes   Patient verbalized understanding of instructions that were given to them at the PAT appointment. Patient was also instructed that they will need to review over the PAT instructions again at home before surgery. Yes  Pt has Downs syndrome and her BMI is 56.4. Her mother has legal Guardianship. Pt reports no SOB with ADLs but she does get SOB when walking and climbing stairs. Her CBG was 200 at PAT visit. I told the mother to call the Dr.to adjust her insulin.

## 2020-06-12 NOTE — Anesthesia Preprocedure Evaluation (Addendum)
Anesthesia Evaluation  Patient identified by MRN, date of birth, ID band Patient awake    Reviewed: Allergy & Precautions, NPO status , Patient's Chart, lab work & pertinent test results  History of Anesthesia Complications Negative for: history of anesthetic complications  Airway Mallampati: III  TM Distance: >3 FB Neck ROM: Full   Comment: Good airway with good neck mobility Dental no notable dental hx. (+) Dental Advisory Given   Pulmonary neg pulmonary ROS,    Pulmonary exam normal        Cardiovascular negative cardio ROS Normal cardiovascular exam     Neuro/Psych negative neurological ROS     GI/Hepatic Neg liver ROS, GERD  Medicated,  Endo/Other  diabetesHypothyroidism Morbid obesity  Renal/GU      Musculoskeletal negative musculoskeletal ROS (+)   Abdominal   Peds  Hematology negative hematology ROS (+)   Anesthesia Other Findings   Reproductive/Obstetrics                           Anesthesia Physical Anesthesia Plan  ASA: III  Anesthesia Plan: General   Post-op Pain Management:    Induction: Intravenous  PONV Risk Score and Plan: 3 and Ondansetron, Dexamethasone and Midazolam  Airway Management Planned: LMA and Oral ETT  Additional Equipment:   Intra-op Plan:   Post-operative Plan: Extubation in OR  Informed Consent: I have reviewed the patients History and Physical, chart, labs and discussed the procedure including the risks, benefits and alternatives for the proposed anesthesia with the patient or authorized representative who has indicated his/her understanding and acceptance.     Dental advisory given  Plan Discussed with: Anesthesiologist and CRNA  Anesthesia Plan Comments:        Anesthesia Quick Evaluation

## 2020-06-17 ENCOUNTER — Other Ambulatory Visit (HOSPITAL_COMMUNITY)
Admission: RE | Admit: 2020-06-17 | Discharge: 2020-06-17 | Disposition: A | Discharge status: Home / Self Care | Payer: Medicaid Other | Source: Ambulatory Visit | Attending: Urology | Admitting: Urology

## 2020-06-17 DIAGNOSIS — Z20822 Contact with and (suspected) exposure to covid-19: Secondary | ICD-10-CM | POA: Insufficient documentation

## 2020-06-17 DIAGNOSIS — Z01812 Encounter for preprocedural laboratory examination: Secondary | ICD-10-CM | POA: Diagnosis present

## 2020-06-17 LAB — SARS CORONAVIRUS 2 (TAT 6-24 HRS): SARS Coronavirus 2: NEGATIVE

## 2020-06-20 ENCOUNTER — Encounter (HOSPITAL_COMMUNITY): Payer: Self-pay | Admitting: Urology

## 2020-06-20 ENCOUNTER — Ambulatory Visit (HOSPITAL_COMMUNITY)
Admission: RE | Admit: 2020-06-20 | Discharge: 2020-06-20 | Disposition: A | Discharge status: Home / Self Care | Payer: Medicaid Other | Attending: Urology | Admitting: Urology

## 2020-06-20 ENCOUNTER — Other Ambulatory Visit: Payer: Self-pay

## 2020-06-20 ENCOUNTER — Ambulatory Visit (HOSPITAL_COMMUNITY): Payer: Medicaid Other | Admitting: Anesthesiology

## 2020-06-20 ENCOUNTER — Encounter (HOSPITAL_COMMUNITY)
Admission: RE | Disposition: A | Discharge status: Home / Self Care | Payer: Self-pay | Source: Home / Self Care | Attending: Urology

## 2020-06-20 ENCOUNTER — Ambulatory Visit (HOSPITAL_COMMUNITY): Payer: Medicaid Other | Admitting: Physician Assistant

## 2020-06-20 ENCOUNTER — Ambulatory Visit (HOSPITAL_COMMUNITY): Payer: Medicaid Other

## 2020-06-20 DIAGNOSIS — Z794 Long term (current) use of insulin: Secondary | ICD-10-CM | POA: Insufficient documentation

## 2020-06-20 DIAGNOSIS — N3592 Unspecified urethral stricture, female: Secondary | ICD-10-CM | POA: Insufficient documentation

## 2020-06-20 DIAGNOSIS — N200 Calculus of kidney: Secondary | ICD-10-CM | POA: Insufficient documentation

## 2020-06-20 DIAGNOSIS — K219 Gastro-esophageal reflux disease without esophagitis: Secondary | ICD-10-CM | POA: Diagnosis not present

## 2020-06-20 DIAGNOSIS — E1140 Type 2 diabetes mellitus with diabetic neuropathy, unspecified: Secondary | ICD-10-CM | POA: Diagnosis not present

## 2020-06-20 DIAGNOSIS — Z87442 Personal history of urinary calculi: Secondary | ICD-10-CM | POA: Diagnosis not present

## 2020-06-20 DIAGNOSIS — Z6841 Body Mass Index (BMI) 40.0 and over, adult: Secondary | ICD-10-CM | POA: Diagnosis not present

## 2020-06-20 DIAGNOSIS — Z79899 Other long term (current) drug therapy: Secondary | ICD-10-CM | POA: Diagnosis not present

## 2020-06-20 DIAGNOSIS — E039 Hypothyroidism, unspecified: Secondary | ICD-10-CM | POA: Insufficient documentation

## 2020-06-20 DIAGNOSIS — Z79890 Hormone replacement therapy (postmenopausal): Secondary | ICD-10-CM | POA: Insufficient documentation

## 2020-06-20 HISTORY — PX: CYSTOSCOPY/URETEROSCOPY/HOLMIUM LASER/STENT PLACEMENT: SHX6546

## 2020-06-20 LAB — GLUCOSE, CAPILLARY
Glucose-Capillary: 146 mg/dL — ABNORMAL HIGH (ref 70–99)
Glucose-Capillary: 233 mg/dL — ABNORMAL HIGH (ref 70–99)

## 2020-06-20 LAB — PREGNANCY, URINE: Preg Test, Ur: NEGATIVE

## 2020-06-20 SURGERY — CYSTOSCOPY/URETEROSCOPY/HOLMIUM LASER/STENT PLACEMENT
Anesthesia: General | Laterality: Right

## 2020-06-20 MED ORDER — PROPOFOL 10 MG/ML IV BOLUS
INTRAVENOUS | Status: DC | PRN
  Administered 2020-06-20: 200 mg via INTRAVENOUS

## 2020-06-20 MED ORDER — ORAL CARE MOUTH RINSE: 15.0000 mL | Freq: Once | OROMUCOSAL | Status: AC

## 2020-06-20 MED ORDER — LIDOCAINE 2% (20 MG/ML) 5 ML SYRINGE
INTRAMUSCULAR | Status: AC
  Filled 2020-06-20: qty 5

## 2020-06-20 MED ORDER — HYDROCODONE-ACETAMINOPHEN 5-325 MG PO TABS
1.0000 | ORAL_TABLET | Freq: Four times a day (QID) | ORAL | 0 refills | Status: DC | PRN
Start: 2020-06-20 — End: 2020-07-11

## 2020-06-20 MED ORDER — SODIUM CHLORIDE 0.9 % IR SOLN
Status: DC | PRN
  Administered 2020-06-20: 6000 mL

## 2020-06-20 MED ORDER — SUCCINYLCHOLINE CHLORIDE 200 MG/10ML IV SOSY
PREFILLED_SYRINGE | INTRAVENOUS | Status: DC | PRN
  Administered 2020-06-20: 120 mg via INTRAVENOUS

## 2020-06-20 MED ORDER — DEXAMETHASONE SODIUM PHOSPHATE 10 MG/ML IJ SOLN
INTRAMUSCULAR | Status: DC | PRN
  Administered 2020-06-20: 10 mg via INTRAVENOUS

## 2020-06-20 MED ORDER — CIPROFLOXACIN HCL 500 MG PO TABS: 500.0000 mg | ORAL_TABLET | Freq: Two times a day (BID) | ORAL | 0 refills | Status: DC

## 2020-06-20 MED ORDER — CHLORHEXIDINE GLUCONATE 0.12 % MT SOLN
15.0000 mL | Freq: Once | OROMUCOSAL | Status: AC
  Administered 2020-06-20: 15 mL via OROMUCOSAL

## 2020-06-20 MED ORDER — PROMETHAZINE HCL 25 MG/ML IJ SOLN: 6.2500 mg | INTRAMUSCULAR | Status: DC | PRN

## 2020-06-20 MED ORDER — DEXAMETHASONE SODIUM PHOSPHATE 10 MG/ML IJ SOLN
INTRAMUSCULAR | Status: AC
  Filled 2020-06-20: qty 1

## 2020-06-20 MED ORDER — PROPOFOL 10 MG/ML IV BOLUS
INTRAVENOUS | Status: AC
  Filled 2020-06-20: qty 40

## 2020-06-20 MED ORDER — FENTANYL CITRATE (PF) 100 MCG/2ML IJ SOLN
INTRAMUSCULAR | Status: DC | PRN
  Administered 2020-06-20: 100 ug via INTRAVENOUS

## 2020-06-20 MED ORDER — IOHEXOL 300 MG/ML  SOLN
INTRAMUSCULAR | Status: DC | PRN
  Administered 2020-06-20: 4 mL

## 2020-06-20 MED ORDER — FENTANYL CITRATE (PF) 100 MCG/2ML IJ SOLN
INTRAMUSCULAR | Status: AC
  Filled 2020-06-20: qty 2

## 2020-06-20 MED ORDER — ACETAMINOPHEN 500 MG PO TABS
1000.0000 mg | ORAL_TABLET | Freq: Once | ORAL | Status: AC
  Administered 2020-06-20: 1000 mg via ORAL
  Filled 2020-06-20: qty 2

## 2020-06-20 MED ORDER — LIDOCAINE 2% (20 MG/ML) 5 ML SYRINGE
INTRAMUSCULAR | Status: DC | PRN
  Administered 2020-06-20: 100 mg via INTRAVENOUS

## 2020-06-20 MED ORDER — CELECOXIB 200 MG PO CAPS
200.0000 mg | ORAL_CAPSULE | Freq: Once | ORAL | Status: AC
  Administered 2020-06-20: 200 mg via ORAL
  Filled 2020-06-20: qty 1

## 2020-06-20 MED ORDER — SUCCINYLCHOLINE CHLORIDE 200 MG/10ML IV SOSY
PREFILLED_SYRINGE | INTRAVENOUS | Status: AC
  Filled 2020-06-20: qty 10

## 2020-06-20 MED ORDER — LACTATED RINGERS IV SOLN: INTRAVENOUS | Status: DC

## 2020-06-20 MED ORDER — MIDAZOLAM HCL 2 MG/2ML IJ SOLN
INTRAMUSCULAR | Status: DC | PRN
  Administered 2020-06-20: 2 mg via INTRAVENOUS

## 2020-06-20 MED ORDER — SODIUM CHLORIDE 0.9% FLUSH: 3.0000 mL | Freq: Two times a day (BID) | INTRAVENOUS | Status: DC

## 2020-06-20 MED ORDER — FENTANYL CITRATE (PF) 100 MCG/2ML IJ SOLN: 25.0000 ug | INTRAMUSCULAR | Status: DC | PRN

## 2020-06-20 MED ORDER — MIDAZOLAM HCL 2 MG/2ML IJ SOLN
INTRAMUSCULAR | Status: AC
  Filled 2020-06-20: qty 2

## 2020-06-20 MED ORDER — CIPROFLOXACIN IN D5W 400 MG/200ML IV SOLN
400.0000 mg | INTRAVENOUS | Status: AC
  Administered 2020-06-20: 400 mg via INTRAVENOUS
  Filled 2020-06-20: qty 200

## 2020-06-20 SURGICAL SUPPLY — 23 items
BAG URO CATCHER STRL LF (MISCELLANEOUS) ×3 IMPLANT
BASKET STONE NCOMPASS (UROLOGICAL SUPPLIES) IMPLANT
CATH URET 5FR 28IN OPEN ENDED (CATHETERS) IMPLANT
CATH URET DUAL LUMEN 6-10FR 50 (CATHETERS) IMPLANT
CLOTH BEACON ORANGE TIMEOUT ST (SAFETY) ×3 IMPLANT
EXTRACTOR STONE NITINOL NGAGE (UROLOGICAL SUPPLIES) IMPLANT
GLOVE SURG SS PI 8.0 STRL IVOR (GLOVE) ×3 IMPLANT
GOWN STRL REUS W/TWL XL LVL3 (GOWN DISPOSABLE) ×3 IMPLANT
GUIDEWIRE STR DUAL SENSOR (WIRE) ×3 IMPLANT
IV NS IRRIG 3000ML ARTHROMATIC (IV SOLUTION) ×3 IMPLANT
KIT TURNOVER KIT A (KITS) IMPLANT
LASER FIB FLEXIVA PULSE ID 365 (Laser) IMPLANT
LASER FIB FLEXIVA PULSE ID 550 (Laser) IMPLANT
LASER FIB FLEXIVA PULSE ID 910 (Laser) IMPLANT
MANIFOLD NEPTUNE II (INSTRUMENTS) ×3 IMPLANT
PACK CYSTO (CUSTOM PROCEDURE TRAY) ×3 IMPLANT
SHEATH URETERAL 12FRX35CM (MISCELLANEOUS) IMPLANT
STENT URET 6FRX24 CONTOUR (STENTS) ×3 IMPLANT
TRACTIP FLEXIVA PULS ID 200XHI (Laser) IMPLANT
TRACTIP FLEXIVA PULSE ID 200 (Laser)
TUBING CONNECTING 10 (TUBING) ×2 IMPLANT
TUBING CONNECTING 10' (TUBING) ×1
TUBING UROLOGY SET (TUBING) ×3 IMPLANT

## 2020-06-20 NOTE — Anesthesia Procedure Notes (Signed)
Procedure Name: Intubation Date/Time: 06/20/2020 7:54 AM Performed by: Pearson Grippe, CRNA Pre-anesthesia Checklist: Patient identified, Emergency Drugs available, Suction available and Patient being monitored Patient Re-evaluated:Patient Re-evaluated prior to induction Oxygen Delivery Method: Circle system utilized Preoxygenation: Pre-oxygenation with 100% oxygen Induction Type: IV induction Ventilation: Mask ventilation without difficulty and Oral airway inserted - appropriate to patient size Laryngoscope Size: Hyacinth Meeker and 2 Grade View: Grade I Tube type: Oral Tube size: 7.0 mm Number of attempts: 1 Airway Equipment and Method: Stylet and Oral airway Placement Confirmation: ETT inserted through vocal cords under direct vision,  positive ETCO2 and breath sounds checked- equal and bilateral Secured at: 21 cm Tube secured with: Tape Dental Injury: Teeth and Oropharynx as per pre-operative assessment

## 2020-06-20 NOTE — Transfer of Care (Signed)
Immediate Anesthesia Transfer of Care Note  Patient: Alexandra Kelley  Procedure(s) Performed: CYSTOSCOPY, RIGHT RETROGRADE /STENT PLACEMENT (Right )  Patient Location: PACU  Anesthesia Type:General  Level of Consciousness: awake, alert  and oriented  Airway & Oxygen Therapy: Patient Spontanous Breathing and Patient connected to face mask oxygen  Post-op Assessment: Report given to RN and Post -op Vital signs reviewed and stable  Post vital signs: Reviewed and stable  Last Vitals:  Vitals Value Taken Time  BP 104/51 06/20/20 0837  Temp    Pulse 110 06/20/20 0839  Resp 22 06/20/20 0839  SpO2 99 % 06/20/20 0839  Vitals shown include unvalidated device data.  Last Pain:  Vitals:   06/20/20 0544  TempSrc:   PainSc: 0-No pain      Patients Stated Pain Goal: 4 (06/20/20 0544)  Complications: No complications documented.

## 2020-06-20 NOTE — H&P (View-Only) (Signed)
1. Bilateral renal stones   2. Renal stones       CC:      I have kidney stones.  HPI:     Alexandra Kelley is a 39 year-old female established patient who is here for renal calculi.  [name] returns today in f/u at the request of Dr. Karilyn Cota for the finding of progressive right renal stone disease on recetn abdominal CT for epigastric pain.   She is known to have multiple right renal stones and a few small left renal stones.   She has had no flank pain or hematuria.  She has had no UTI's.  She has diabetes and is now on Novolog.  She is very obese.   She has no voiding complaints.       ROS:  ROS:  A complete review of systems was performed. All systems are negative except for pertinent findings as noted.   ROS       Allergies  Allergen Reactions  . Ceclor [Cefaclor]     Rash  . Invokana [Canagliflozin]     Severe yeast infections  . Lyrica [Pregabalin] Itching  . Metformin And Related     GI distress  . Onglyza [Saxagliptin]     headaches  . Zofran [Ondansetron Hcl] Other (See Comments)    Itchy rash in mouth  . Penicillins Rash    Has patient had a PCN reaction causing immediate rash, facial/tongue/throat swelling, SOB or lightheadedness with hypotension: No Has patient had a PCN reaction causing severe rash involving mucus membranes or skin necrosis: No Has patient had a PCN reaction that required hospitalization: No Has patient had a PCN reaction occurring within the last 10 years: Yes If all of the above answers are "NO", then may proceed with Cephalosporin use.          Outpatient Encounter Medications as of 05/17/2020  Medication Sig Note  . dicyclomine (BENTYL) 10 MG capsule Take 1 capsule (10 mg total) by mouth 3 (three) times daily as needed for spasms. (Patient taking differently: Take 10 mg by mouth as needed for spasms. )   . gabapentin (NEURONTIN) 300 MG capsule Take 300 mg by mouth 3 (three) times daily. 05/07/2016: Received  from: External Pharmacy  . insulin aspart (NOVOLOG) 100 UNIT/ML injection Inject into the skin 2 (two) times daily. 25 units in the morning , 20 at night   . levothyroxine (SYNTHROID, LEVOTHROID) 200 MCG tablet Take 175 mcg by mouth daily before breakfast.    . nortriptyline (PAMELOR) 25 MG capsule Take 25 mg by mouth at bedtime.   Marland Kitchen nystatin cream (MYCOSTATIN) Apply 1 application topically 2 (two) times daily.   . pantoprazole (PROTONIX) 40 MG tablet TAKE 1 TABLET BY MOUTH EVERY DAY   . silver sulfADIAZINE (SILVADENE) 1 % cream Apply 1 application topically daily. 01/01/2020: As needed.  . traMADol (ULTRAM) 50 MG tablet TAKE 1 TABLET BY MOUTH EVERY 12 HOURS AS NEEDED (Patient taking differently: Take 50 mg by mouth 2 (two) times daily. )    No facility-administered encounter medications on file as of 05/17/2020.        Past Medical History:  Diagnosis Date  . Carpal tunnel syndrome    both wrists  . Diabetes mellitus without complication (HCC)    Dr. Lurene Shadow  . GERD (gastroesophageal reflux disease)   . Kidney stone   . Neuropathy   . Thyroid disease          Past Surgical History:  Procedure Laterality  Date  . BACK SURGERY    . FEMUR IM NAIL Left 10/03/2013   Procedure: INTRAMEDULLARY (IM) RETROGRADE FEMORAL NAILING;  Surgeon: Mark C Yates, MD;  Location: [facility] OR;  Service: Orthopedics;  Laterality: Left;  . tubes in ears      Social History        Socioeconomic History  . Marital status: Single    Spouse name: Not on file  . Number of children: Not on file  . Years of education: 12  . Highest education level: Not on file  Occupational History  . Not on file  Tobacco Use  . Smoking status: Never Smoker  . Smokeless tobacco: Never Used  Vaping Use  . Vaping Use: Never used  Substance and Sexual Activity  . Alcohol use: No  . Drug use: No  . Sexual activity: Never    Birth control/protection: None  Other Topics Concern  . Not on file   Social History Narrative   Patient lives at home with her parents Tammy and Timmy.   Patient has a high school certificate.   Right handed.   Caffeine coke cola sometimes.            Social Determinants of Health      Financial Resource Strain:   . Difficulty of Paying Living Expenses:   Food Insecurity:   . Worried About Running Out of Food in the Last Year:   . Ran Out of Food in the Last Year:   Transportation Needs:   . Lack of Transportation (Medical):   . Lack of Transportation (Non-Medical):   Physical Activity:   . Days of Exercise per Week:   . Minutes of Exercise per Session:   Stress:   . Feeling of Stress :   Social Connections:   . Frequency of Communication with Friends and Family:   . Frequency of Social Gatherings with Friends and Family:   . Attends Religious Services:   . Active Member of Clubs or Organizations:   . Attends Club or Organization Meetings:   . Marital Status:   Intimate Partner Violence:   . Fear of Current or Ex-Partner:   . Emotionally Abused:   . Physically Abused:   . Sexually Abused:          Family History  Problem Relation Age of Onset  . Thyroid disease Mother   . Kidney Stones Father   . Heart attack Paternal Grandmother   . Thyroid disease Maternal Grandmother   . Other Maternal Grandmother        heart issue  . Heart attack Maternal Grandfather   . Diabetes Maternal Grandfather   . Colon cancer Maternal Grandfather   . Cancer Maternal Grandfather        prostate       Objective:    Vitals:   05/17/20 1438  BP: 102/60  Pulse: (!) 113  Temp: 99.3 F (37.4 C)     Physical Exam Vitals reviewed.  Constitutional:      Appearance: She is obese.  HENT:     Head:     Comments: Down's fascies Cardiovascular:     Rate and Rhythm: Normal rate and regular rhythm.     Heart sounds: Normal heart sounds.  Pulmonary:     Effort: Pulmonary effort is normal. No respiratory  distress.     Breath sounds: Normal breath sounds.  Abdominal:     Comments: Morbidly obese without tenderness.   Musculoskeletal:          General: No swelling or tenderness. Normal range of motion.  Skin:    General: Skin is warm and dry.  Neurological:     Mental Status: She is alert.     Lab Results:  Lab Results Last 24 Hours       Results for orders placed or performed in visit on 05/17/20 (from the past 24 hour(s))  POCT urinalysis dipstick     Status: Abnormal   Collection Time: 05/17/20  2:45 PM  Result Value Ref Range   Color, UA yellow    Clarity, UA clear    Glucose, UA Negative Negative   Bilirubin, UA moderate    Ketones, UA neg    Spec Grav, UA >=1.030 (A) 1.010 - 1.025   Blood, UA neg    pH, UA 5.0 5.0 - 8.0   Protein, UA Negative Negative   Urobilinogen, UA negative (A) 0.2 or 1.0 E.U./dL   Nitrite, UA neg    Leukocytes, UA Moderate (2+) (A) Negative   Appearance     Odor        BMET Recent Labs (last 2 labs)   No results for input(s): NA, K, CL, CO2, GLUCOSE, BUN, CREATININE, CALCIUM in the last 72 hours.   PSA Last labs  No results found for: PSA   Last labs  No results found for: TESTOSTERONE      Studies/Results: I have reviewed her last several CT scans.  There were no stones in 2010 and have progressively increased particularly over the last couple of years.     Assessment & Plan: Renal stones She has bilateral renal stones with minimal burden on the left but multiple right renal stones, at least 10 on my count, on the right.   The stones are mostly about 4-78mm but appear larger in size than on prior imaging.  There is no obstruction but I feel she needs to be treated and reviewed the options of PCNL, ESWL and Ureteroscopy.  Because of her size, I think the PCNL and ESWL would be problematic and I would prefer to refer to [facility] for a PCNL.  They are most interested in Ureteroscopy with the understanding that  she may require multiple procedures and will definitely need a stent.  The risks of bleeding, infection, ureteral injury, need to stent first and then return for secondary procedures, thrombotic events and anesthetic complications were reviewed.     No orders of the defined types were placed in this encounter.         Orders Placed This Encounter  Procedures  . Urine Culture    Standing Status:   Future    Standing Expiration Date:   06/17/2020  . Urinalysis, Routine w reflex microscopic    Standing Status:   Future    Standing Expiration Date:   06/17/2020  . POCT urinalysis dipstick      Return for Schedule ureteroscopy. Marland Kitchen

## 2020-06-20 NOTE — Op Note (Signed)
Procedure: 1.  Cystoscopy with urethral dilation. 2.  Cystoscopy with right retrograde pyelogram and interpretation. 3.  Cystoscopy with insertion of right double-J stent. 4. Application of fluoroscopy.   Preop diagnosis: Multiple right renal stones.  Postop diagnosis: 1 multiple right renal stones. 2.  Ureteral stricture. 3.  Urethral stenosis.  Surgeon: Dr. Bjorn Pippin.  Anesthesia: General.  Specimen: None.  Drains: 6 French by 24 cm right contour double-J stent.  EBL: 2 mL.  Complications: None.  Indications: The patient is a 39 year old female with multiple right renal stones who is to undergo right ureteroscopic stone extraction.  She is obese with a long skin the stone distance it was not felt that percutaneous nephrolithotomy or lithotripsy would be appropriate as first-line therapy.  Procedure: She was given Cipro.  A general anesthetic was induced.  She was placed in lithotomy position and fitted with PAS hose.  Her perineum and genitalia were prepped with Betadine solution and she was draped in the usual sterile fashion.  Initial attempted cystoscopy was difficult because of a very deep urethral meatus related to her obesity.  She was eventually found to have urethral stenosis and a 6.5 French dual-lumen ureteroscope was then used to cannulate the urethra and a sensor wire was passed up the right ureteral orifice under fluoroscopic guidance.  Using the wire to help locate the meatus, her urethra was dilated to 53 [place] with female sounds.  I was then able to insert the 23 French cystoscope and inspect the bladder.  She had a smooth bladder wall without mucosal lesions and the ureteral orifices were in the normal anatomic position.  The cystoscope was then reinserted over the wire and the 5 [place] open-ended catheter was advanced into the right distal ureter.  Contrast was instilled after the wire was removed.  The right retrograde pyelogram revealed a delicate ureter  without filling defects.  There were multiple filling defects in the intrarenal collecting system consistent with her stones but there was no significant hydronephrosis.  The wire was then reinserted to the kidney and the cystoscope was removed.  An attempt to pass the 35 cm digital access sheath 12 French inner core was made.  There was resistance proximally and I was unable to advance the dilator beyond the mid proximal ureter.  The dilator was then removed and the cystoscope was reinserted over the wire.  At this point it was felt that it would be best to place a stent and return after period of soft dilation.  A 6 French by 26 cm contour double-J stent was advanced the kidney under fluoroscopic guidance.  The wire was removed, leaving a good coil in the kidney and a good coil in the bladder.  The bladder was drained and the cystoscope was removed.  She was taken down from lithotomy position, her anesthetic was reversed and she was moved recovery in stable condition.  There were no complications.

## 2020-06-20 NOTE — Discharge Instructions (Signed)

## 2020-06-20 NOTE — Anesthesia Postprocedure Evaluation (Signed)
Anesthesia Post Note  Patient: Alexandra Kelley  Procedure(s) Performed: CYSTOSCOPY, RIGHT RETROGRADE /STENT PLACEMENT (Right )     Patient location during evaluation: PACU Anesthesia Type: General Level of consciousness: sedated Pain management: pain level controlled Vital Signs Assessment: post-procedure vital signs reviewed and stable Respiratory status: spontaneous breathing and respiratory function stable Cardiovascular status: stable Postop Assessment: no apparent nausea or vomiting Anesthetic complications: no   No complications documented.  Last Vitals:  Vitals:   06/20/20 0900 06/20/20 0913  BP:  111/68  Pulse:  (!) 107  Resp:  16  Temp: 36.6 C 36.6 C  SpO2: 92% 95%    Last Pain:  Vitals:   06/20/20 0913  TempSrc:   PainSc: 0-No pain                 Rhiannan Kievit DANIEL

## 2020-06-20 NOTE — H&P (Signed)
1. Bilateral renal stones   2. Renal stones       CC:      I have kidney stones.  HPI:     Alexandra Kelley is a 39 year-old female established patient who is here for renal calculi.  Alexandra Kelley returns today in f/u at the request of Dr. Karilyn Cota for the finding of progressive right renal stone disease on recetn abdominal CT for epigastric pain.   She is known to have multiple right renal stones and a few small left renal stones.   She has had no flank pain or hematuria.  She has had no UTI's.  She has diabetes and is now on Novolog.  She is very obese.   She has no voiding complaints.       ROS:  ROS:  A complete review of systems was performed. All systems are negative except for pertinent findings as noted.   ROS       Allergies  Allergen Reactions  . Ceclor [Cefaclor]     Rash  . Invokana [Canagliflozin]     Severe yeast infections  . Lyrica [Pregabalin] Itching  . Metformin And Related     GI distress  . Onglyza [Saxagliptin]     headaches  . Zofran [Ondansetron Hcl] Other (See Comments)    Itchy rash in mouth  . Penicillins Rash    Has patient had a PCN reaction causing immediate rash, facial/tongue/throat swelling, SOB or lightheadedness with hypotension: No Has patient had a PCN reaction causing severe rash involving mucus membranes or skin necrosis: No Has patient had a PCN reaction that required hospitalization: No Has patient had a PCN reaction occurring within the last 10 years: Yes If all of the above answers are "NO", then may proceed with Cephalosporin use.          Outpatient Encounter Medications as of 05/17/2020  Medication Sig Note  . dicyclomine (BENTYL) 10 MG capsule Take 1 capsule (10 mg total) by mouth 3 (three) times daily as needed for spasms. (Patient taking differently: Take 10 mg by mouth as needed for spasms. )   . gabapentin (NEURONTIN) 300 MG capsule Take 300 mg by mouth 3 (three) times daily. 05/07/2016: Received  from: External Pharmacy  . insulin aspart (NOVOLOG) 100 UNIT/ML injection Inject into the skin 2 (two) times daily. 25 units in the morning , 20 at night   . levothyroxine (SYNTHROID, LEVOTHROID) 200 MCG tablet Take 175 mcg by mouth daily before breakfast.    . nortriptyline (PAMELOR) 25 MG capsule Take 25 mg by mouth at bedtime.   Marland Kitchen nystatin cream (MYCOSTATIN) Apply 1 application topically 2 (two) times daily.   . pantoprazole (PROTONIX) 40 MG tablet TAKE 1 TABLET BY MOUTH EVERY DAY   . silver sulfADIAZINE (SILVADENE) 1 % cream Apply 1 application topically daily. 01/01/2020: As needed.  . traMADol (ULTRAM) 50 MG tablet TAKE 1 TABLET BY MOUTH EVERY 12 HOURS AS NEEDED (Patient taking differently: Take 50 mg by mouth 2 (two) times daily. )    No facility-administered encounter medications on file as of 05/17/2020.        Past Medical History:  Diagnosis Date  . Carpal tunnel syndrome    both wrists  . Diabetes mellitus without complication (HCC)    Dr. Lurene Shadow  . GERD (gastroesophageal reflux disease)   . Kidney stone   . Neuropathy   . Thyroid disease          Past Surgical History:  Procedure Laterality  Date  . BACK SURGERY    . FEMUR IM NAIL Left 10/03/2013   Procedure: INTRAMEDULLARY (IM) RETROGRADE FEMORAL NAILING;  Surgeon: Eldred Manges, MD;  Location: [facility] OR;  Service: Orthopedics;  Laterality: Left;  . tubes in ears      Social History        Socioeconomic History  . Marital status: Single    Spouse name: Not on file  . Number of children: Not on file  . Years of education: 47  . Highest education level: Not on file  Occupational History  . Not on file  Tobacco Use  . Smoking status: Never Smoker  . Smokeless tobacco: Never Used  Vaping Use  . Vaping Use: Never used  Substance and Sexual Activity  . Alcohol use: No  . Drug use: No  . Sexual activity: Never    Birth control/protection: None  Other Topics Concern  . Not on file   Social History Narrative   Patient lives at home with her parents [place] and [place].   Patient has a high school certificate.   Right handed.   Caffeine coke cola sometimes.            Social Determinants of Health      Financial Resource Strain:   . Difficulty of Paying Living Expenses:   Food Insecurity:   . Worried About Programme researcher, broadcasting/film/video in the Last Year:   . Barista in the Last Year:   Transportation Needs:   . Freight forwarder (Medical):   Marland Kitchen Lack of Transportation (Non-Medical):   Physical Activity:   . Days of Exercise per Week:   . Minutes of Exercise per Session:   Stress:   . Feeling of Stress :   Social Connections:   . Frequency of Communication with Friends and Family:   . Frequency of Social Gatherings with Friends and Family:   . Attends Religious Services:   . Active Member of Clubs or Organizations:   . Attends Banker Meetings:   Marland Kitchen Marital Status:   Intimate Partner Violence:   . Fear of Current or Ex-Partner:   . Emotionally Abused:   Marland Kitchen Physically Abused:   . Sexually Abused:          Family History  Problem Relation Age of Onset  . Thyroid disease Mother   . Kidney Stones Father   . Heart attack Paternal Grandmother   . Thyroid disease Maternal Grandmother   . Other Maternal Grandmother        heart issue  . Heart attack Maternal Grandfather   . Diabetes Maternal Grandfather   . Colon cancer Maternal Grandfather   . Cancer Maternal Grandfather        prostate       Objective:    Vitals:   05/17/20 1438  BP: 102/60  Pulse: (!) 113  Temp: 99.3 F (37.4 C)     Physical Exam Vitals reviewed.  Constitutional:      Appearance: She is obese.  HENT:     Head:     Comments: Down's fascies Cardiovascular:     Rate and Rhythm: Normal rate and regular rhythm.     Heart sounds: Normal heart sounds.  Pulmonary:     Effort: Pulmonary effort is normal. No respiratory  distress.     Breath sounds: Normal breath sounds.  Abdominal:     Comments: Morbidly obese without tenderness.   Musculoskeletal:  General: No swelling or tenderness. Normal range of motion.  Skin:    General: Skin is warm and dry.  Neurological:     Mental Status: She is alert.     Lab Results:  Lab Results Last 24 Hours       Results for orders placed or performed in visit on 05/17/20 (from the past 24 hour(s))  POCT urinalysis dipstick     Status: Abnormal   Collection Time: 05/17/20  2:45 PM  Result Value Ref Range   Color, UA yellow    Clarity, UA clear    Glucose, UA Negative Negative   Bilirubin, UA moderate    Ketones, UA neg    Spec Grav, UA >=1.030 (A) 1.010 - 1.025   Blood, UA neg    pH, UA 5.0 5.0 - 8.0   Protein, UA Negative Negative   Urobilinogen, UA negative (A) 0.2 or 1.0 E.U./dL   Nitrite, UA neg    Leukocytes, UA Moderate (2+) (A) Negative   Appearance     Odor        BMET Recent Labs (last 2 labs)   No results for input(s): NA, K, CL, CO2, GLUCOSE, BUN, CREATININE, CALCIUM in the last 72 hours.   PSA Last labs  No results found for: PSA   Last labs  No results found for: TESTOSTERONE      Studies/Results: I have reviewed her last several CT scans.  There were no stones in 2010 and have progressively increased particularly over the last couple of years.     Assessment & Plan: Renal stones She has bilateral renal stones with minimal burden on the left but multiple right renal stones, at least 10 on my count, on the right.   The stones are mostly about 4-78mm but appear larger in size than on prior imaging.  There is no obstruction but I feel she needs to be treated and reviewed the options of PCNL, ESWL and Ureteroscopy.  Because of her size, I think the PCNL and ESWL would be problematic and I would prefer to refer to [facility] for a PCNL.  They are most interested in Ureteroscopy with the understanding that  she may require multiple procedures and will definitely need a stent.  The risks of bleeding, infection, ureteral injury, need to stent first and then return for secondary procedures, thrombotic events and anesthetic complications were reviewed.     No orders of the defined types were placed in this encounter.         Orders Placed This Encounter  Procedures  . Urine Culture    Standing Status:   Future    Standing Expiration Date:   06/17/2020  . Urinalysis, Routine w reflex microscopic    Standing Status:   Future    Standing Expiration Date:   06/17/2020  . POCT urinalysis dipstick      Return for Schedule ureteroscopy. Marland Kitchen

## 2020-06-21 ENCOUNTER — Encounter (HOSPITAL_COMMUNITY): Payer: Self-pay | Admitting: Urology

## 2020-06-21 ENCOUNTER — Other Ambulatory Visit: Payer: Self-pay | Admitting: Urology

## 2020-07-02 NOTE — Progress Notes (Signed)
DUE TO COVID-19 ONLY ONE VISITOR IS ALLOWED TO COME WITH YOU AND STAY IN THE WAITING ROOM ONLY DURING PRE OP AND PROCEDURE DAY OF SURGERY. THE 1 VISITOR  MAY VISIT WITH YOU AFTER SURGERY IN YOUR PRIVATE ROOM DURING VISITING HOURS ONLY!  YOU NEED TO HAVE A COVID 19 TEST ON_______ @_______ , THIS TEST MUST BE DONE BEFORE SURGERY,  COVID TESTING SITE [address] [place] , IT IS ON THE RIGHT GOING OUT [address] APPROXIMATELY  2 MINUTES PAST [employer] ON THE RIGHT. ONCE YOUR COVID TEST IS COMPLETED,  PLEASE BEGIN THE QUARANTINE INSTRUCTIONS AS OUTLINED IN YOUR HANDOUT.                Alexandra Kelley  07/02/2020   Your procedure is scheduled on: 07/11/2020    Report to [facility] Main  Entrance   Report to admitting     [phone]     Call this number if you have problems the morning of surgery [phone]    Remember: Do not eat food , candy gum or mints :After Midnight. You may have clear liquids from midnight until 0430amGabapentin, synthroid, Protonix     CLEAR LIQUID DIET   Foods Allowed                                                                       Coffee and tea, regular and decaf                              Plain Jell-O any favor except red or purple                                            Fruit ices (not with fruit pulp)                                      Iced Popsicles                                     Carbonated beverages, regular and diet                                    Cranberry, grape and apple juices Sports drinks like Gatorade Lightly seasoned clear broth or consume(fat free) Sugar, honey syrup   _____________________________________________________________________    BRUSH YOUR TEETH MORNING OF SURGERY AND RINSE YOUR MOUTH OUT, NO CHEWING GUM CANDY OR MINTS.     Take these medicines the morning of surgery with A SIP OF WATER:  Gabapentin, Synthroid, Protonix , Take 70% of evening dose of novolog 70/30   DO NOT TAKE ANY DIABETIC MEDICATIONS DAY OF YOUR SURGERY  You may not have any metal on your body including hair pins and              piercings  Do not wear jewelry, make-up, lotions, powders or perfumes, deodorant             Do not wear nail polish on your fingernails.  Do not shave  48 hours prior to surgery.              Men may shave face and neck.   Do not bring valuables to the hospital. [place].  Contacts, dentures or bridgework may not be worn into surgery.  Leave suitcase in the car. After surgery it may be brought to your room.     Patients discharged the day of surgery will not be allowed to drive home. IF YOU ARE HAVING SURGERY AND GOING HOME THE SAME DAY, YOU MUST HAVE AN ADULT TO DRIVE YOU HOME AND BE WITH YOU FOR 24 HOURS. YOU MAY GO HOME BY TAXI OR UBER OR ORTHERWISE, BUT AN ADULT MUST ACCOMPANY YOU HOME AND STAY WITH YOU FOR 24 HOURS.  Name and phone number of your driver:  Special Instructions: N/A              Please read over the following fact sheets you were given: _____________________________________________________________________  [facility] - Preparing for Surgery Before surgery, you can play an important role.  Because skin is not sterile, your skin needs to be as free of germs as possible.  You can reduce the number of germs on your skin by washing with CHG (chlorahexidine gluconate) soap before surgery.  CHG is an antiseptic cleaner which kills germs and bonds with the skin to continue killing germs even after washing. Please DO NOT use if you have an allergy to CHG or antibacterial soaps.  If your skin becomes reddened/irritated stop using the CHG and inform your nurse when you arrive at Short Stay. Do not shave (including legs and underarms) for at least 48 hours prior to the first CHG shower.  You may shave your face/neck. Please follow these instructions carefully:  1.   Shower with CHG Soap the night before surgery and the  morning of Surgery.  2.  If you choose to wash your hair, wash your hair first as usual with your  normal  shampoo.  3.  After you shampoo, rinse your hair and body thoroughly to remove the  shampoo.                           4.  Use CHG as you would any other liquid soap.  You can apply chg directly  to the skin and wash                       Gently with a scrungie or clean washcloth.  5.  Apply the CHG Soap to your body ONLY FROM THE NECK DOWN.   Do not use on face/ open                           Wound or open sores. Avoid contact with eyes, ears mouth and genitals (private parts).  Wash face,  Genitals (private parts) with your normal soap.             6.  Wash thoroughly, paying special attention to the area where your surgery  will be performed.  7.  Thoroughly rinse your body with warm water from the neck down.  8.  DO NOT shower/wash with your normal soap after using and rinsing off  the CHG Soap.                9.  Pat yourself dry with a clean towel.            10.  Wear clean pajamas.            11.  Place clean sheets on your bed the night of your first shower and do not  sleep with pets. Day of Surgery : Do not apply any lotions/deodorants the morning of surgery.  Please wear clean clothes to the hospital/surgery center.  FAILURE TO FOLLOW THESE INSTRUCTIONS MAY RESULT IN THE CANCELLATION OF YOUR SURGERY PATIENT SIGNATURE_________________________________  NURSE SIGNATURE__________________________________  ________________________________________________________________________

## 2020-07-03 ENCOUNTER — Encounter (HOSPITAL_COMMUNITY)
Admission: RE | Admit: 2020-07-03 | Discharge: 2020-07-03 | Disposition: A | Discharge status: Home / Self Care | Payer: Medicaid Other | Source: Ambulatory Visit | Attending: Urology | Admitting: Urology

## 2020-07-03 ENCOUNTER — Other Ambulatory Visit: Payer: Self-pay

## 2020-07-03 DIAGNOSIS — Z01812 Encounter for preprocedural laboratory examination: Secondary | ICD-10-CM | POA: Insufficient documentation

## 2020-07-03 LAB — BASIC METABOLIC PANEL
Anion gap: 12 (ref 5–15)
BUN: 9 mg/dL (ref 6–20)
CO2: 29 mmol/L (ref 22–32)
Calcium: 9.6 mg/dL (ref 8.9–10.3)
Chloride: 97 mmol/L — ABNORMAL LOW (ref 98–111)
Creatinine, Ser: 0.72 mg/dL (ref 0.44–1.00)
GFR calc Af Amer: >60 mL/min (ref >60)
GFR calc non Af Amer: >60 mL/min (ref >60)
Glucose, Bld: 170 mg/dL — ABNORMAL HIGH (ref 70–99)
Potassium: 4.4 mmol/L (ref 3.5–5.1)
Sodium: 138 mmol/L (ref 135–145)

## 2020-07-03 LAB — CBC
HCT: 40.1 % (ref 36.0–46.0)
Hemoglobin: 12.6 g/dL (ref 12.0–15.0)
MCH: 26.8 pg (ref 26.0–34.0)
MCHC: 31.4 g/dL (ref 30.0–36.0)
MCV: 85.3 fL (ref 80.0–100.0)
Platelets: 227 10*3/uL (ref 150–400)
RBC: 4.7 MIL/uL (ref 3.87–5.11)
RDW: 14.9 % (ref 11.5–15.5)
WBC: 8.2 10*3/uL (ref 4.0–10.5)
nRBC: 0 % (ref 0.0–0.2)

## 2020-07-04 ENCOUNTER — Ambulatory Visit: Payer: Medicaid Other | Admitting: Urology

## 2020-07-04 DIAGNOSIS — N200 Calculus of kidney: Secondary | ICD-10-CM

## 2020-07-04 LAB — GLUCOSE, CAPILLARY: Glucose-Capillary: 164 mg/dL — ABNORMAL HIGH (ref 70–99)

## 2020-07-06 ENCOUNTER — Other Ambulatory Visit (INDEPENDENT_AMBULATORY_CARE_PROVIDER_SITE_OTHER): Payer: Self-pay | Admitting: Internal Medicine

## 2020-07-09 ENCOUNTER — Other Ambulatory Visit (HOSPITAL_COMMUNITY)
Admission: RE | Admit: 2020-07-09 | Discharge: 2020-07-09 | Disposition: A | Discharge status: Home / Self Care | Payer: Medicaid Other | Source: Ambulatory Visit | Attending: Urology | Admitting: Urology

## 2020-07-09 DIAGNOSIS — Z20822 Contact with and (suspected) exposure to covid-19: Secondary | ICD-10-CM | POA: Diagnosis not present

## 2020-07-09 DIAGNOSIS — Z01812 Encounter for preprocedural laboratory examination: Secondary | ICD-10-CM | POA: Insufficient documentation

## 2020-07-10 LAB — SARS CORONAVIRUS 2 (TAT 6-24 HRS): SARS Coronavirus 2: NEGATIVE

## 2020-07-10 NOTE — Anesthesia Preprocedure Evaluation (Addendum)
Anesthesia Evaluation  Patient identified by MRN, date of birth, ID band Patient awake    Reviewed: Allergy & Precautions, NPO status , Patient's Chart, lab work & pertinent test results  Airway Mallampati: III  TM Distance: >3 FB Neck ROM: Full    Dental no notable dental hx. (+) Teeth Intact   Pulmonary neg pulmonary ROS,    Pulmonary exam normal breath sounds clear to auscultation       Cardiovascular negative cardio ROS Normal cardiovascular exam Rhythm:Regular Rate:Normal  HLD   Neuro/Psych negative neurological ROS  negative psych ROS   GI/Hepatic Neg liver ROS, GERD  Medicated,  Endo/Other  diabetes, Insulin DependentHypothyroidism Morbid obesity  Renal/GU negative Renal ROS  negative genitourinary   Musculoskeletal  (+) Arthritis ,   Abdominal   Peds High functioning Down's   Hematology negative hematology ROS (+)   Anesthesia Other Findings Right renal stones  Reproductive/Obstetrics                            Anesthesia Physical Anesthesia Plan  ASA: III  Anesthesia Plan: General   Post-op Pain Management:    Induction: Intravenous  PONV Risk Score and Plan: 3 and Midazolam, Dexamethasone and Ondansetron  Airway Management Planned: LMA  Additional Equipment:   Intra-op Plan:   Post-operative Plan: Extubation in OR  Informed Consent: I have reviewed the patients History and Physical, chart, labs and discussed the procedure including the risks, benefits and alternatives for the proposed anesthesia with the patient or authorized representative who has indicated his/her understanding and acceptance.     Dental advisory given  Plan Discussed with: CRNA  Anesthesia Plan Comments:         Anesthesia Quick Evaluation

## 2020-07-11 ENCOUNTER — Ambulatory Visit (HOSPITAL_COMMUNITY)
Admission: RE | Admit: 2020-07-11 | Discharge: 2020-07-11 | Disposition: A | Discharge status: Home / Self Care | Payer: Medicaid Other | Attending: Urology | Admitting: Urology

## 2020-07-11 ENCOUNTER — Ambulatory Visit (HOSPITAL_COMMUNITY): Payer: Medicaid Other | Admitting: Anesthesiology

## 2020-07-11 ENCOUNTER — Ambulatory Visit (HOSPITAL_COMMUNITY): Payer: Medicaid Other

## 2020-07-11 ENCOUNTER — Encounter (HOSPITAL_COMMUNITY)
Admission: RE | Disposition: A | Discharge status: Home / Self Care | Payer: Self-pay | Source: Home / Self Care | Attending: Urology

## 2020-07-11 ENCOUNTER — Encounter (HOSPITAL_COMMUNITY): Payer: Self-pay | Admitting: Urology

## 2020-07-11 DIAGNOSIS — Z888 Allergy status to other drugs, medicaments and biological substances status: Secondary | ICD-10-CM | POA: Insufficient documentation

## 2020-07-11 DIAGNOSIS — E1140 Type 2 diabetes mellitus with diabetic neuropathy, unspecified: Secondary | ICD-10-CM | POA: Diagnosis not present

## 2020-07-11 DIAGNOSIS — Z79899 Other long term (current) drug therapy: Secondary | ICD-10-CM | POA: Insufficient documentation

## 2020-07-11 DIAGNOSIS — K219 Gastro-esophageal reflux disease without esophagitis: Secondary | ICD-10-CM | POA: Insufficient documentation

## 2020-07-11 DIAGNOSIS — Z833 Family history of diabetes mellitus: Secondary | ICD-10-CM | POA: Diagnosis not present

## 2020-07-11 DIAGNOSIS — Z87442 Personal history of urinary calculi: Secondary | ICD-10-CM | POA: Insufficient documentation

## 2020-07-11 DIAGNOSIS — Z881 Allergy status to other antibiotic agents status: Secondary | ICD-10-CM | POA: Insufficient documentation

## 2020-07-11 DIAGNOSIS — Z8349 Family history of other endocrine, nutritional and metabolic diseases: Secondary | ICD-10-CM | POA: Insufficient documentation

## 2020-07-11 DIAGNOSIS — Z8042 Family history of malignant neoplasm of prostate: Secondary | ICD-10-CM | POA: Diagnosis not present

## 2020-07-11 DIAGNOSIS — E079 Disorder of thyroid, unspecified: Secondary | ICD-10-CM | POA: Diagnosis not present

## 2020-07-11 DIAGNOSIS — Z6841 Body Mass Index (BMI) 40.0 and over, adult: Secondary | ICD-10-CM | POA: Diagnosis not present

## 2020-07-11 DIAGNOSIS — N200 Calculus of kidney: Secondary | ICD-10-CM | POA: Diagnosis not present

## 2020-07-11 DIAGNOSIS — Z800 Family history of malignant neoplasm of digestive organs: Secondary | ICD-10-CM | POA: Insufficient documentation

## 2020-07-11 DIAGNOSIS — Q909 Down syndrome, unspecified: Secondary | ICD-10-CM

## 2020-07-11 DIAGNOSIS — E785 Hyperlipidemia, unspecified: Secondary | ICD-10-CM | POA: Diagnosis not present

## 2020-07-11 DIAGNOSIS — Z8249 Family history of ischemic heart disease and other diseases of the circulatory system: Secondary | ICD-10-CM | POA: Diagnosis not present

## 2020-07-11 DIAGNOSIS — Z880 Allergy status to penicillin: Secondary | ICD-10-CM | POA: Insufficient documentation

## 2020-07-11 DIAGNOSIS — Z794 Long term (current) use of insulin: Secondary | ICD-10-CM | POA: Insufficient documentation

## 2020-07-11 HISTORY — PX: CYSTOSCOPY/URETEROSCOPY/HOLMIUM LASER/STENT PLACEMENT: SHX6546

## 2020-07-11 LAB — GLUCOSE, CAPILLARY
Glucose-Capillary: 181 mg/dL — ABNORMAL HIGH (ref 70–99)
Glucose-Capillary: 271 mg/dL — ABNORMAL HIGH (ref 70–99)
Glucose-Capillary: 272 mg/dL — ABNORMAL HIGH (ref 70–99)
Glucose-Capillary: 290 mg/dL — ABNORMAL HIGH (ref 70–99)
Glucose-Capillary: 315 mg/dL — ABNORMAL HIGH (ref 70–99)
Glucose-Capillary: 329 mg/dL — ABNORMAL HIGH (ref 70–99)
Glucose-Capillary: 300 mg/dL — ABNORMAL HIGH (ref 70–99)

## 2020-07-11 LAB — PREGNANCY, URINE: Preg Test, Ur: NEGATIVE

## 2020-07-11 SURGERY — CYSTOSCOPY/URETEROSCOPY/HOLMIUM LASER/STENT PLACEMENT
Anesthesia: General | Laterality: Right

## 2020-07-11 MED ORDER — INSULIN ASPART 100 UNIT/ML ~~LOC~~ SOLN
SUBCUTANEOUS | Status: AC
  Filled 2020-07-11: qty 1

## 2020-07-11 MED ORDER — DEXAMETHASONE SODIUM PHOSPHATE 10 MG/ML IJ SOLN
INTRAMUSCULAR | Status: DC | PRN
  Administered 2020-07-11: 10 mg via INTRAVENOUS

## 2020-07-11 MED ORDER — SUCCINYLCHOLINE CHLORIDE 200 MG/10ML IV SOSY
PREFILLED_SYRINGE | INTRAVENOUS | Status: DC | PRN
  Administered 2020-07-11: 200 mg via INTRAVENOUS

## 2020-07-11 MED ORDER — CHLORHEXIDINE GLUCONATE 0.12 % MT SOLN
15.0000 mL | Freq: Once | OROMUCOSAL | Status: AC
  Administered 2020-07-11: 15 mL via OROMUCOSAL

## 2020-07-11 MED ORDER — CIPROFLOXACIN IN D5W 400 MG/200ML IV SOLN
400.0000 mg | INTRAVENOUS | Status: AC
  Administered 2020-07-11: 400 mg via INTRAVENOUS
  Filled 2020-07-11: qty 200

## 2020-07-11 MED ORDER — ACETAMINOPHEN 325 MG PO TABS: 650.0000 mg | ORAL_TABLET | ORAL | Status: DC | PRN

## 2020-07-11 MED ORDER — SODIUM CHLORIDE 0.9% FLUSH: 3.0000 mL | Freq: Two times a day (BID) | INTRAVENOUS | Status: DC

## 2020-07-11 MED ORDER — INSULIN ASPART 100 UNIT/ML ~~LOC~~ SOLN
8.0000 [IU] | Freq: Once | SUBCUTANEOUS | Status: AC
  Administered 2020-07-11: 8 [IU] via SUBCUTANEOUS

## 2020-07-11 MED ORDER — LIDOCAINE 2% (20 MG/ML) 5 ML SYRINGE
INTRAMUSCULAR | Status: AC
  Filled 2020-07-11: qty 5

## 2020-07-11 MED ORDER — LIDOCAINE 2% (20 MG/ML) 5 ML SYRINGE
INTRAMUSCULAR | Status: DC | PRN
  Administered 2020-07-11: 50 mg via INTRAVENOUS

## 2020-07-11 MED ORDER — PROPOFOL 10 MG/ML IV BOLUS
INTRAVENOUS | Status: DC | PRN
  Administered 2020-07-11: 200 mg via INTRAVENOUS
  Administered 2020-07-11: 30 mg via INTRAVENOUS

## 2020-07-11 MED ORDER — SODIUM CHLORIDE 0.9 % IR SOLN
Status: DC | PRN
  Administered 2020-07-11: 6000 mL

## 2020-07-11 MED ORDER — FENTANYL CITRATE (PF) 100 MCG/2ML IJ SOLN: 25.0000 ug | INTRAMUSCULAR | Status: DC | PRN

## 2020-07-11 MED ORDER — MIDAZOLAM HCL 2 MG/2ML IJ SOLN
INTRAMUSCULAR | Status: AC
  Filled 2020-07-11: qty 2

## 2020-07-11 MED ORDER — ROCURONIUM BROMIDE 10 MG/ML (PF) SYRINGE
PREFILLED_SYRINGE | INTRAVENOUS | Status: DC | PRN
  Administered 2020-07-11: 20 mg via INTRAVENOUS

## 2020-07-11 MED ORDER — HYDROCODONE-ACETAMINOPHEN 5-325 MG PO TABS
1.0000 | ORAL_TABLET | Freq: Four times a day (QID) | ORAL | 0 refills | Status: AC | PRN
Start: 2020-07-11 — End: 2021-07-11

## 2020-07-11 MED ORDER — OXYCODONE HCL 5 MG PO TABS
5.0000 mg | ORAL_TABLET | Freq: Once | ORAL | Status: AC
  Administered 2020-07-11: 5 mg via ORAL

## 2020-07-11 MED ORDER — INSULIN ASPART 100 UNIT/ML ~~LOC~~ SOLN
10.0000 [IU] | Freq: Once | SUBCUTANEOUS | Status: AC
  Administered 2020-07-11: 10 [IU] via INTRAVENOUS

## 2020-07-11 MED ORDER — STERILE WATER FOR IRRIGATION IR SOLN
Status: DC | PRN
  Administered 2020-07-11: 1000 mL

## 2020-07-11 MED ORDER — LACTATED RINGERS IV SOLN: INTRAVENOUS | Status: DC

## 2020-07-11 MED ORDER — ORAL CARE MOUTH RINSE: 15.0000 mL | Freq: Once | OROMUCOSAL | Status: AC

## 2020-07-11 MED ORDER — ACETAMINOPHEN 650 MG RE SUPP
650.0000 mg | RECTAL | Status: DC | PRN
  Filled 2020-07-11: qty 1

## 2020-07-11 MED ORDER — FENTANYL CITRATE (PF) 100 MCG/2ML IJ SOLN
INTRAMUSCULAR | Status: DC | PRN
  Administered 2020-07-11 (×2): 50 ug via INTRAVENOUS

## 2020-07-11 MED ORDER — MORPHINE SULFATE (PF) 4 MG/ML IV SOLN: 2.0000 mg | INTRAVENOUS | Status: DC | PRN

## 2020-07-11 MED ORDER — SUCCINYLCHOLINE CHLORIDE 200 MG/10ML IV SOSY
PREFILLED_SYRINGE | INTRAVENOUS | Status: AC
  Filled 2020-07-11: qty 10

## 2020-07-11 MED ORDER — MIDAZOLAM HCL 5 MG/5ML IJ SOLN
INTRAMUSCULAR | Status: DC | PRN
  Administered 2020-07-11: 2 mg via INTRAVENOUS

## 2020-07-11 MED ORDER — PROPOFOL 10 MG/ML IV BOLUS
INTRAVENOUS | Status: AC
  Filled 2020-07-11: qty 40

## 2020-07-11 MED ORDER — OXYCODONE HCL 5 MG PO TABS: 5.0000 mg | ORAL_TABLET | ORAL | Status: DC | PRN

## 2020-07-11 MED ORDER — SODIUM CHLORIDE 0.9 % IV SOLN: 250.0000 mL | INTRAVENOUS | Status: DC | PRN

## 2020-07-11 MED ORDER — OXYCODONE HCL 5 MG PO TABS
ORAL_TABLET | ORAL | Status: AC
  Filled 2020-07-11: qty 1

## 2020-07-11 MED ORDER — ACETAMINOPHEN 500 MG PO TABS
1000.0000 mg | ORAL_TABLET | Freq: Once | ORAL | Status: AC
  Administered 2020-07-11: 1000 mg via ORAL
  Filled 2020-07-11: qty 2

## 2020-07-11 MED ORDER — DEXAMETHASONE SODIUM PHOSPHATE 10 MG/ML IJ SOLN
INTRAMUSCULAR | Status: AC
  Filled 2020-07-11: qty 1

## 2020-07-11 MED ORDER — CIPROFLOXACIN HCL 500 MG PO TABS: 500.0000 mg | ORAL_TABLET | Freq: Two times a day (BID) | ORAL | 0 refills | Status: DC

## 2020-07-11 MED ORDER — SODIUM CHLORIDE 0.9% FLUSH: 3.0000 mL | INTRAVENOUS | Status: DC | PRN

## 2020-07-11 MED ORDER — SUGAMMADEX SODIUM 200 MG/2ML IV SOLN
INTRAVENOUS | Status: DC | PRN
  Administered 2020-07-11: 200 mg via INTRAVENOUS

## 2020-07-11 MED ORDER — FENTANYL CITRATE (PF) 100 MCG/2ML IJ SOLN
INTRAMUSCULAR | Status: AC
  Filled 2020-07-11: qty 2

## 2020-07-11 MED ORDER — INSULIN ASPART 100 UNIT/ML ~~LOC~~ SOLN
10.0000 [IU] | Freq: Once | SUBCUTANEOUS | Status: AC
  Administered 2020-07-11: 10 [IU] via SUBCUTANEOUS

## 2020-07-11 SURGICAL SUPPLY — 23 items
BAG URO CATCHER STRL LF (MISCELLANEOUS) ×3 IMPLANT
BASKET STONE NCOMPASS (UROLOGICAL SUPPLIES) IMPLANT
CATH URET 5FR 28IN OPEN ENDED (CATHETERS) IMPLANT
CATH URET DUAL LUMEN 6-10FR 50 (CATHETERS) IMPLANT
CLOTH BEACON ORANGE TIMEOUT ST (SAFETY) ×3 IMPLANT
EXTRACTOR STONE NITINOL NGAGE (UROLOGICAL SUPPLIES) ×3 IMPLANT
GLOVE SURG SS PI 8.0 STRL IVOR (GLOVE) ×3 IMPLANT
GOWN STRL REUS W/TWL XL LVL3 (GOWN DISPOSABLE) ×3 IMPLANT
GUIDEWIRE STR DUAL SENSOR (WIRE) ×3 IMPLANT
IV NS IRRIG 3000ML ARTHROMATIC (IV SOLUTION) ×3 IMPLANT
KIT TURNOVER KIT A (KITS) IMPLANT
LASER FIB FLEXIVA PULSE ID 365 (Laser) IMPLANT
LASER FIB FLEXIVA PULSE ID 550 (Laser) IMPLANT
LASER FIB FLEXIVA PULSE ID 910 (Laser) IMPLANT
MANIFOLD NEPTUNE II (INSTRUMENTS) ×3 IMPLANT
PACK CYSTO (CUSTOM PROCEDURE TRAY) ×3 IMPLANT
SHEATH URETERAL 12FRX35CM (MISCELLANEOUS) ×3 IMPLANT
STENT URET 6FRX22 CONTOUR (STENTS) ×3 IMPLANT
TRACTIP FLEXIVA PULS ID 200XHI (Laser) ×1 IMPLANT
TRACTIP FLEXIVA PULSE ID 200 (Laser) ×3
TUBING CONNECTING 10 (TUBING) ×2 IMPLANT
TUBING CONNECTING 10' (TUBING) ×1
TUBING UROLOGY SET (TUBING) ×3 IMPLANT

## 2020-07-11 NOTE — Interval H&P Note (Signed)
History and Physical Interval Note:  Stented at last encounter but the ureter was too tight for URS.  Will attempt again today.   07/11/2020 7:22 AM  Alexandra Kelley  has presented today for surgery, with the diagnosis of right renal stones.  The various methods of treatment have been discussed with the patient and family. After consideration of risks, benefits and other options for treatment, the patient has consented to  Procedure(s): CYSTOSCOPY RIGHT URETEROSCOPY/HOLMIUM LASER/STENT EXCHANGE (Right) as a surgical intervention.  The patient's history has been reviewed, patient examined, no change in status, stable for surgery.  I have reviewed the patient's chart and labs.  Questions were answered to the patient's satisfaction.     Bjorn Pippin

## 2020-07-11 NOTE — Transfer of Care (Signed)
Immediate Anesthesia Transfer of Care Note  Patient: Alexandra Kelley  Procedure(s) Performed: CYSTOSCOPY RIGHT URETEROSCOPY/HOLMIUM LASER/STENT EXCHANGE (Right )  Patient Location: PACU  Anesthesia Type:General  Level of Consciousness: awake, alert , oriented and patient cooperative  Airway & Oxygen Therapy: Patient Spontanous Breathing and Patient connected to face mask oxygen  Post-op Assessment: Report given to RN, Post -op Vital signs reviewed and stable and Patient moving all extremities  Post vital signs: Reviewed and stable  Last Vitals:  Vitals Value Taken Time  BP 123/68 07/11/20 0919  Temp    Pulse 113 07/11/20 0920  Resp 18 07/11/20 0920  SpO2 100 % 07/11/20 0920  Vitals shown include unvalidated device data.  Last Pain:  Vitals:   07/11/20 0621  TempSrc: Oral  PainSc:          Complications: No complications documented.

## 2020-07-11 NOTE — Anesthesia Postprocedure Evaluation (Signed)
Anesthesia Post Note  Patient: Alexandra Kelley  Procedure(s) Performed: CYSTOSCOPY RIGHT URETEROSCOPY/HOLMIUM LASER/STENT EXCHANGE (Right )     Patient location during evaluation: PACU Anesthesia Type: General Level of consciousness: awake and alert Pain management: pain level controlled Vital Signs Assessment: post-procedure vital signs reviewed and stable Respiratory status: spontaneous breathing, nonlabored ventilation, respiratory function stable and patient connected to nasal cannula oxygen Cardiovascular status: blood pressure returned to baseline and stable Postop Assessment: no apparent nausea or vomiting Anesthetic complications: no   No complications documented.  Last Vitals:  Vitals:   07/11/20 1215 07/11/20 1230  BP: (!) 106/95 101/68  Pulse: (!) 106 (!) 105  Resp: 18 17  Temp: 37.3 C   SpO2: 93% 94%    Last Pain:  Vitals:   07/11/20 1230  TempSrc:   PainSc: 2                  [place] Perra L Kavya Haag

## 2020-07-11 NOTE — Anesthesia Procedure Notes (Signed)
Procedure Name: Intubation Date/Time: 07/11/2020 7:40 AM Performed by: Victoriano Lain, CRNA Pre-anesthesia Checklist: Patient identified, Emergency Drugs available, Suction available, Patient being monitored and Timeout performed Patient Re-evaluated:Patient Re-evaluated prior to induction Oxygen Delivery Method: Circle system utilized Preoxygenation: Pre-oxygenation with 100% oxygen Induction Type: IV induction Ventilation: Mask ventilation without difficulty Laryngoscope Size: Mac and 4 Grade View: Grade I Tube type: Oral Tube size: 7.5 mm Number of attempts: 1 Airway Equipment and Method: Stylet Placement Confirmation: ETT inserted through vocal cords under direct vision,  positive ETCO2 and breath sounds checked- equal and bilateral Secured at: 21 cm Tube secured with: Tape Dental Injury: Teeth and Oropharynx as per pre-operative assessment

## 2020-07-11 NOTE — Discharge Instructions (Addendum)
Ureteral Stent Implantation, Care After This sheet gives you information about how to care for yourself after your procedure. Your health care provider may also give you more specific instructions. If you have problems or questions, contact your health care provider. What can I expect after the procedure? After the procedure, it is common to have:  Nausea.  Mild pain when you urinate. You may feel this pain in your lower back or lower abdomen. The pain should stop within a few minutes after you urinate. This may last for up to 1 week.  A small amount of blood in your urine for several days. Follow these instructions at home: Medicines  Take over-the-counter and prescription medicines only as told by your health care provider.  If you were prescribed an antibiotic medicine, take it as told by your health care provider. Do not stop taking the antibiotic even if you start to feel better.  Do not drive for 24 hours if you were given a sedative during your procedure.  Ask your health care provider if the medicine prescribed to you requires you to avoid driving or using heavy machinery. Activity  Rest as told by your health care provider.  Avoid sitting for a long time without moving. Get up to take short walks every 1-2 hours. This is important to improve blood flow and breathing. Ask for help if you feel weak or unsteady.  Return to your normal activities as told by your health care provider. Ask your health care provider what activities are safe for you. General instructions   Watch for any blood in your urine. Call your health care provider if the amount of blood in your urine increases.  If you have a catheter: ? Follow instructions from your health care provider about taking care of your catheter and collection bag. ? Do not take baths, swim, or use a hot tub until your health care provider approves. Ask your health care provider if you may take showers. You may only be allowed to  take sponge baths.  Drink enough fluid to keep your urine pale yellow.  Do not use any products that contain nicotine or tobacco, such as cigarettes, e-cigarettes, and chewing tobacco. These can delay healing after surgery. If you need help quitting, ask your health care provider.  Keep all follow-up visits as told by your health care provider. This is important. Contact a health care provider if:  You have pain that gets worse or does not get better with medicine, especially pain when you urinate.  You have difficulty urinating.  You feel nauseous or you vomit repeatedly during a period of more than 2 days after the procedure. Get help right away if:  Your urine is dark red or has blood clots in it.  You are leaking urine (have incontinence).  The end of the stent comes out of your urethra.  You cannot urinate.  You have sudden, sharp, or severe pain in your abdomen or lower back.  You have a fever.  You have swelling or pain in your legs.  You have difficulty breathing. Summary  After the procedure, it is common to have mild pain when you urinate that goes away within a few minutes after you urinate. This may last for up to 1 week.  Watch for any blood in your urine. Call your health care provider if the amount of blood in your urine increases.  Take over-the-counter and prescription medicines only as told by your health care provider.  Drink   enough fluid to keep your urine pale yellow.  The stent has a string tucked vaginally like a tampon string.  The stent can be removed on Monday by pulling the string.  That can be done at home or if needed, in the office.  If you would like [place] to pull the stent please call the office to come in next Monday or Tuesday to have it removed.   This information is not intended to replace advice given to you by your health care provider. Make sure you discuss any questions you have with your health care provider. Document Revised:  07/26/2018 Document Reviewed: 07/27/2018 Elsevier Patient Education  2020 [employer].

## 2020-07-11 NOTE — Op Note (Signed)
Procedure: 1.  Cystoscopy with removal of right double-J stent. 2.  Right ureteroscopic stone extraction with holmium laser application and insertion of right double-J stent. 3.  Application of fluoroscopy.  Preop diagnosis: Right renal stones.  Postop diagnosis: Same.  Surgeon: Dr. Bjorn Pippin.  Anesthesia: General.  Specimen: Stone fragment.  Drain: 6 [place] by 22 cm right contour double-J stent.  EBL: 5 mL.  Complications: None.  Indications: The patient is a 39 year old female with multiple right renal stones who is body habitus precludes lithotripsy or percutaneous nephrolithotomy.  She had previously undergone placement of a right ureteral stent as her ureter was initially to tight for ureteroscopy.  She returns now for stone management.  Procedure: She was given Cipro.  A general anesthetic was induced.  She was placed in lithotomy position and fitted with PAS hose.  Her perineum and genitalia were prepped with Betadine solution and she was draped in usual sterile fashion.  Cystoscopy was performed using a 23 [place] scope and 30 degree lens.  There was some difficulty inserting the scope due to mild urethral meatal stenosis and her obesity.  But once in place cystoscopy demonstrated an unremarkable bladder wall.  The left ureteral orifice was unremarkable.  The right ureteral orifice had a stent in place with some mild encrustation.  The stent was grasped with a grasping forceps and pulled the urethral meatus.  A sensor wire was then advanced to the kidney under fluoroscopic guidance and the stent was removed leaving the wire in place.  The 12 French inner core of a 35 cm digital access sheath was then advanced over the wire to the kidney under fluoroscopic guidance.  There was no resistance.  The assembled sheath was then equally easily passed to the level of the kidney.  The wire and inner core were then removed.  The dual-lumen digital flexible ureteroscope was then advanced to  the kidney and the stones were identified.  There were multiple stones in an upper pole calyx in the renal pelvis and then a lower calyx.  The stones were round and tan and appeared to measure 5 to 7 mm in size.  A 200 m laser fiber was passed through the ureteroscope and the Moses laser was set on 0.8 J and 10 Hz on the right pedal and 0.2 J and 70 Hz on the left pedal.  The stone was primarily fragmented with the right pedal and broke easily and the manageable fragments.  One upper pole fragment was difficult to reach with the laser but I was able to grasp it with an engage basket and remove it.  This fragment a measure approximately 4 to 5 mm and was sent for analysis.  The remainder of the stone fragments were reduced further with a dusting feature of the Moses laser on the left pedal.  Progress of the fragmentation was followed with both fluoroscopy and visual inspection.  Eventually all of the stones appeared to be appropriately fragmented and dusted.  A few additional fragments were removed with the engage basket to ensure that the size was sufficiently small to pass.  No large residual fragments were noted.  Ureteroscope was removed after central sensor wire was passed through the working channel and the ureter was inspected visually as the sheath was removed.  No significant ureteral injury was identified.  A 6 French by 22 cm contour double-J stent with tether was then advanced over the wire under fluoroscopic guidance.  The wire was removed, leaving a  good coil in the kidney and a good coil in the bladder.  Endoscopic inspection confirmed good distal placement.  The stent string was left exiting the urethra.  The string was tied close to the meatus and trimmed to an appropriate length and tucked vaginally.  She was then taken down from lithotomy position, her anesthetic was reversed and she was moved to recovery in stable condition.  There were no complications.

## 2020-07-12 ENCOUNTER — Encounter (HOSPITAL_COMMUNITY): Payer: Self-pay | Admitting: Urology

## 2020-07-12 LAB — SURGICAL PATHOLOGY

## 2020-07-16 ENCOUNTER — Other Ambulatory Visit: Payer: Self-pay

## 2020-07-16 ENCOUNTER — Ambulatory Visit (INDEPENDENT_AMBULATORY_CARE_PROVIDER_SITE_OTHER): Payer: Medicaid Other

## 2020-07-16 DIAGNOSIS — N200 Calculus of kidney: Secondary | ICD-10-CM | POA: Diagnosis not present

## 2020-07-16 NOTE — Progress Notes (Signed)
Pt here today to have stent string removed. Upon string removal, no string visibly seen. Tried to look into vaginal opening with speculum and was unsuccessful. Pt noted to tense up while trying to use speculum. Pt mother states that to their knowledge the string has not been pulled out. Per RN, pt instructed to come back to office tomorrow to have doctor assess pt.

## 2020-07-17 ENCOUNTER — Ambulatory Visit: Payer: Medicaid Other | Admitting: Urology

## 2020-07-17 LAB — CALCULI, WITH PHOTOGRAPH (CLINICAL LAB)
Calcium Oxalate Dihydrate: 30 %
Calcium Oxalate Monohydrate: 70 %
Weight Calculi: 45 mg

## 2020-08-02 ENCOUNTER — Encounter: Payer: Self-pay | Admitting: Urology

## 2020-08-02 ENCOUNTER — Other Ambulatory Visit: Payer: Self-pay

## 2020-08-02 ENCOUNTER — Ambulatory Visit (INDEPENDENT_AMBULATORY_CARE_PROVIDER_SITE_OTHER): Payer: Medicaid Other | Admitting: Urology

## 2020-08-02 DIAGNOSIS — N200 Calculus of kidney: Secondary | ICD-10-CM

## 2020-08-02 LAB — URINALYSIS, ROUTINE W REFLEX MICROSCOPIC
Bilirubin, UA: NEGATIVE
Glucose, UA: NEGATIVE
Ketones, UA: NEGATIVE
Nitrite, UA: NEGATIVE
Protein,UA: NEGATIVE
Specific Gravity, UA: 1.02 (ref 1.005–1.030)
Urobilinogen, Ur: 0.2 mg/dL (ref 0.2–1.0)
pH, UA: 5.5 (ref 5.0–7.5)

## 2020-08-02 LAB — MICROSCOPIC EXAMINATION: Renal Epithel, UA: NONE SEEN /hpf

## 2020-08-02 NOTE — Progress Notes (Signed)
Subjective:  1. Renal stones      Alexandra Kelley returns today in f/u from right ureteroscopy for multiple right renal stones on 07/11/20.  She had her stent removed on 07/16/20.   She is having no pain or hematuria but hasn't seen much pass.   Her UA today has 11-30 WBC and 3-10 RBC with few bacteria.   Her stones were calcium oxalate.      ROS:  ROS:  A complete review of systems was performed.  All systems are negative except for pertinent findings as noted.   ROS  Allergies  Allergen Reactions   5-Alpha Reductase Inhibitors    Ceclor [Cefaclor]     Rash   Invokana [Canagliflozin]     Severe yeast infections   Lyrica [Pregabalin] Itching   Metformin And Related     GI distress   Onglyza [Saxagliptin]     headaches   Zofran [Ondansetron Hcl] Other (See Comments)    Itchy rash in mouth   Penicillins Rash    Has patient had a PCN reaction causing immediate rash, facial/tongue/throat swelling, SOB or lightheadedness with hypotension: No Has patient had a PCN reaction causing severe rash involving mucus membranes or skin necrosis: No Has patient had a PCN reaction that required hospitalization: No Has patient had a PCN reaction occurring within the last 10 years: Yes If all of the above answers are "NO", then may proceed with Cephalosporin use.     Outpatient Encounter Medications as of 08/02/2020  Medication Sig Note   dicyclomine (BENTYL) 10 MG capsule Take 1 capsule (10 mg total) by mouth 3 (three) times daily as needed for spasms. (Patient taking differently: Take 10 mg by mouth every 8 (eight) hours as needed (stomach spasms.). )    gabapentin (NEURONTIN) 300 MG capsule Take 300 mg by mouth 3 (three) times daily.    HYDROcodone-acetaminophen (NORCO/VICODIN) 5-325 MG tablet Take 1 tablet by mouth every 6 (six) hours as needed for severe pain.    hydrOXYzine (ATARAX/VISTARIL) 25 MG tablet Take 25 mg by mouth 2 (two) times daily as needed for itching or anxiety.     levothyroxine (SYNTHROID) 175 MCG tablet Take 175 mcg by mouth daily before breakfast.    nortriptyline (PAMELOR) 25 MG capsule Take 25 mg by mouth at bedtime.    NOVOLOG MIX 70/30 FLEXPEN (70-30) 100 UNIT/ML FlexPen Inject 40-45 Units into the skin See admin instructions. Inject 45 units subcutaneously in the morning & inject 40 units subcutaneously in the evening. 07/11/2020: 28 units,2000.07/10/20   nystatin cream (MYCOSTATIN) Apply 1 application topically 2 (two) times daily.    pantoprazole (PROTONIX) 40 MG tablet TAKE 1 TABLET BY MOUTH EVERY DAY    rosuvastatin (CRESTOR) 40 MG tablet Take 40 mg by mouth daily.    silver sulfADIAZINE (SILVADENE) 1 % cream Apply 1 application topically daily. (Patient taking differently: Apply 1 application topically daily as needed (wound care). )    traMADol (ULTRAM) 50 MG tablet TAKE 1 TABLET BY MOUTH EVERY 12 HOURS AS NEEDED (Patient taking differently: Take 50 mg by mouth 2 (two) times daily. )    [DISCONTINUED] ciprofloxacin (CIPRO) 500 MG tablet Take 1 tablet (500 mg total) by mouth 2 (two) times daily.    No facility-administered encounter medications on file as of 08/02/2020.    Past Medical History:  Diagnosis Date   Arthritis    back   Carpal tunnel syndrome    both wrists   Diabetes mellitus without complication (HCC)  Dr. Lurene Shadow   GERD (gastroesophageal reflux disease)    History of kidney stones    Hypothyroidism    Kidney stone    Neuropathy    feet and hands   Thyroid disease     Past Surgical History:  Procedure Laterality Date   BACK SURGERY  1990s   CYSTOSCOPY/URETEROSCOPY/HOLMIUM LASER/STENT PLACEMENT Right 06/20/2020   Procedure: CYSTOSCOPY, RIGHT RETROGRADE /STENT PLACEMENT;  Surgeon: Bjorn Pippin, MD;  Location: [facility];  Service: Urology;  Laterality: Right;   CYSTOSCOPY/URETEROSCOPY/HOLMIUM LASER/STENT PLACEMENT Right 07/11/2020   Procedure: CYSTOSCOPY RIGHT URETEROSCOPY/HOLMIUM LASER/STENT EXCHANGE;   Surgeon: Bjorn Pippin, MD;  Location: [facility];  Service: Urology;  Laterality: Right;   FEMUR IM NAIL Left 10/03/2013   Procedure: INTRAMEDULLARY (IM) RETROGRADE FEMORAL NAILING;  Surgeon: Eldred Manges, MD;  Location: [facility];  Service: Orthopedics;  Laterality: Left;   tubes in ears      Social History   Socioeconomic History   Marital status: Single    Spouse name: Not on file   Number of children: Not on file   Years of education: 12   Highest education level: Not on file  Occupational History   Not on file  Tobacco Use   Smoking status: Never Smoker   Smokeless tobacco: Never Used  Vaping Use   Vaping Use: Never used  Substance and Sexual Activity   Alcohol use: No   Drug use: No   Sexual activity: Never    Birth control/protection: None  Other Topics Concern   Not on file  Social History Narrative   Patient lives at home with her parents [place] and 45.   Patient has a high school certificate.   Right handed.   Caffeine coke cola sometimes.            Social Determinants of Health   Financial Resource Strain:    Difficulty of Paying Living Expenses: Not on file  Food Insecurity:    Worried About Programme researcher, broadcasting/film/video in the Last Year: Not on file   [employer] of Food in the Last Year: Not on file  Transportation Needs:    Lack of Transportation (Medical): Not on file   Lack of Transportation (Non-Medical): Not on file  Physical Activity:    Days of Exercise per Week: Not on file   Minutes of Exercise per Session: Not on file  Stress:    Feeling of Stress : Not on file  Social Connections:    Frequency of Communication with Friends and Family: Not on file   Frequency of Social Gatherings with Friends and Family: Not on file   Attends Religious Services: Not on file   Active Member of Clubs or Organizations: Not on file   Attends Banker Meetings: Not on file   Marital Status: Not on file  Intimate Partner Violence:     Fear of Current or Ex-Partner: Not on file   Emotionally Abused: Not on file   Physically Abused: Not on file   Sexually Abused: Not on file    Family History  Problem Relation Age of Onset   Thyroid disease Mother    Kidney Stones Father    Heart attack Paternal Grandmother    Thyroid disease Maternal Grandmother    Other Maternal Grandmother        heart issue   Heart attack Maternal Grandfather    Diabetes Maternal Grandfather    Colon cancer Maternal Grandfather    Cancer Maternal Grandfather  prostate       Objective: There were no vitals filed for this visit.   Physical Exam  Lab Results:  Results for orders placed or performed in visit on 08/02/20 (from the past 24 hour(s))  Urinalysis, Routine w reflex microscopic     Status: Abnormal   Collection Time: 08/02/20  3:02 PM  Result Value Ref Range   Specific Gravity, UA 1.020 1.005 - 1.030   pH, UA 5.5 5.0 - 7.5   Color, UA Yellow Yellow   Appearance Ur Clear Clear   Leukocytes,UA 1+ (A) Negative   Protein,UA Negative Negative/Trace   Glucose, UA Negative Negative   Ketones, UA Negative Negative   RBC, UA Trace (A) Negative   Bilirubin, UA Negative Negative   Urobilinogen, Ur 0.2 0.2 - 1.0 mg/dL   Nitrite, UA Negative Negative   Microscopic Examination See below:    Narrative   Performed at:  [address] - [facility] [address], [place], [place]  [id number] Lab Director: Chinita Pester MT, Phone:  [phone]  Microscopic Examination     Status: Abnormal   Collection Time: 08/02/20  3:02 PM   Urine  Result Value Ref Range   WBC, UA 11-30 (A) 0 - 5 /hpf   RBC 3-10 (A) 0 - 2 /hpf   Epithelial Cells (non renal) 0-10 0 - 10 /hpf   Renal Epithel, UA None seen None seen /hpf   Bacteria, UA Few (A) None seen/Few   Narrative   Performed at:  [address] - [facility] [address] [place], [place]  [id number] Lab Director: Chinita Pester MT, Phone:  [phone]    BMET No  results for input(s): NA, K, CL, CO2, GLUCOSE, BUN, CREATININE, CALCIUM in the last 72 hours. PSA No results found for: PSA No results found for: TESTOSTERONE    Studies/Results: I have reviewed her last several CT scans.  There were no stones in 2010 and have progressively increased particularly over the last couple of years.     Assessment & Plan: Right renal stones doing well s/p right URS.   I am going to get a CT stone study in a month because her body habitus will make a renal US and KUB difficult to interpret.    No orders of the defined types were placed in this encounter.    Orders Placed This Encounter  Procedures   Urine Culture   Microscopic Examination   CT RENAL STONE STUDY    Standing Status:   Future    Standing Expiration Date:   10/02/2020    Order Specific Question:   Preferred imaging location?    Answer:   [facility]    Order Specific Question:   Radiology Contrast Protocol - do NOT remove file path    Answer:   [email or web address]    Order Specific Question:   Is patient pregnant?    Answer:   No   Urinalysis, Routine w reflex microscopic      Return for 4-6 weeks with CT results. .   CC: Marylynn Pearson, FNP  And Dr. Lionel December.    Bjorn Pippin 08/02/2020

## 2020-08-02 NOTE — Progress Notes (Signed)

## 2020-08-04 LAB — URINE CULTURE

## 2020-08-05 ENCOUNTER — Telehealth: Payer: Self-pay

## 2020-08-05 NOTE — Telephone Encounter (Signed)
-----   Message from Bjorn Pippin, MD sent at 08/05/2020 10:22 AM EDT ----- Mx species.  No treatment.

## 2020-08-05 NOTE — Telephone Encounter (Signed)
Pt.notified

## 2020-08-28 ENCOUNTER — Other Ambulatory Visit: Payer: Self-pay

## 2020-08-28 ENCOUNTER — Ambulatory Visit (HOSPITAL_COMMUNITY)
Admission: RE | Admit: 2020-08-28 | Discharge: 2020-08-28 | Disposition: A | Discharge status: Home / Self Care | Payer: Medicaid Other | Source: Ambulatory Visit | Attending: Urology | Admitting: Urology

## 2020-08-28 DIAGNOSIS — N200 Calculus of kidney: Secondary | ICD-10-CM | POA: Insufficient documentation

## 2020-09-20 ENCOUNTER — Ambulatory Visit: Payer: Medicaid Other | Admitting: Urology

## 2020-11-05 ENCOUNTER — Other Ambulatory Visit (INDEPENDENT_AMBULATORY_CARE_PROVIDER_SITE_OTHER): Payer: Self-pay | Admitting: Internal Medicine

## 2020-11-05 MED ORDER — PANTOPRAZOLE SODIUM 40 MG PO TBEC
40.0000 mg | DELAYED_RELEASE_TABLET | Freq: Every day | ORAL | 3 refills | Status: DC
Start: 2020-11-05 — End: 2021-03-06

## 2020-11-05 NOTE — Progress Notes (Signed)
Prescription for pantoprazole sent to patient's pharmacy

## 2020-11-11 ENCOUNTER — Other Ambulatory Visit: Payer: Self-pay | Admitting: Adult Health

## 2020-11-21 ENCOUNTER — Other Ambulatory Visit: Payer: Self-pay

## 2020-11-21 ENCOUNTER — Encounter: Payer: Self-pay | Admitting: Urology

## 2020-11-21 ENCOUNTER — Ambulatory Visit (INDEPENDENT_AMBULATORY_CARE_PROVIDER_SITE_OTHER): Payer: Medicaid Other | Admitting: Urology

## 2020-11-21 VITALS — BP 99/67 | HR 116 | Temp 99.7°F | Ht 61.0 in | Wt 277.0 lb

## 2020-11-21 DIAGNOSIS — R8281 Pyuria: Secondary | ICD-10-CM

## 2020-11-21 DIAGNOSIS — N200 Calculus of kidney: Secondary | ICD-10-CM | POA: Diagnosis not present

## 2020-11-21 DIAGNOSIS — R8271 Bacteriuria: Secondary | ICD-10-CM

## 2020-11-21 LAB — MICROSCOPIC EXAMINATION: Renal Epithel, UA: NONE SEEN /hpf

## 2020-11-21 LAB — URINALYSIS, ROUTINE W REFLEX MICROSCOPIC
Bilirubin, UA: NEGATIVE
Glucose, UA: NEGATIVE
Ketones, UA: NEGATIVE
Nitrite, UA: NEGATIVE
Protein,UA: NEGATIVE
RBC, UA: NEGATIVE
Specific Gravity, UA: 1.02 (ref 1.005–1.030)
Urobilinogen, Ur: 0.2 mg/dL (ref 0.2–1.0)
pH, UA: 5.5 (ref 5.0–7.5)

## 2020-11-21 NOTE — Progress Notes (Signed)
Urological Symptom Review  Patient is experiencing the following symptoms: None    Review of Systems  Gastrointestinal (upper)  : Negative for upper GI symptoms  Gastrointestinal (lower) : Negative for lower GI symptoms  Constitutional : Negative for symptoms  Skin: Itching  Eyes: Negative for eye symptoms  Ear/Nose/Throat : Sinus problems  Hematologic/Lymphatic: Negative for Hematologic/Lymphatic symptoms  Cardiovascular : Negative for cardiovascular symptoms  Respiratory : Negative for respiratory symptoms  Endocrine: Negative for endocrine symptoms  Musculoskeletal: Back pain  Neurological: Headache   Psychologic: Negative for psychiatric symptoms

## 2020-11-21 NOTE — Progress Notes (Signed)
Subjective:  1. Renal stones   2. Bacteriuria with pyuria      Alexandra Kelley returns today in f/u from right ureteroscopy for multiple right renal stones on 07/11/20.  She had her stent removed on 07/16/20.   She is having no pain or hematuria but hasn't seen much pass.   Her stones were calcium oxalate.  She had a post op CT in 10/21 and had multiple residual non-obstructing right renal and left renal stones but the stone burden is reduced.  She has had no flank pain or hematoma.     ROS:  ROS:  A complete review of systems was performed.  All systems are negative except for pertinent findings as noted.   ROS  Allergies  Allergen Reactions   5-Alpha Reductase Inhibitors    Ceclor [Cefaclor]     Rash   Invokana [Canagliflozin]     Severe yeast infections   Lyrica [Pregabalin] Itching   Metformin And Related     GI distress   Onglyza [Saxagliptin]     headaches   Zofran [Ondansetron Hcl] Other (See Comments)    Itchy rash in mouth   Penicillins Rash    Has patient had a PCN reaction causing immediate rash, facial/tongue/throat swelling, SOB or lightheadedness with hypotension: No Has patient had a PCN reaction causing severe rash involving mucus membranes or skin necrosis: No Has patient had a PCN reaction that required hospitalization: No Has patient had a PCN reaction occurring within the last 10 years: Yes If all of the above answers are "NO", then may proceed with Cephalosporin use.     Outpatient Encounter Medications as of 11/21/2020  Medication Sig Note   dicyclomine (BENTYL) 10 MG capsule Take 1 capsule (10 mg total) by mouth 3 (three) times daily as needed for spasms. (Patient taking differently: Take 10 mg by mouth every 8 (eight) hours as needed (stomach spasms.).)    gabapentin (NEURONTIN) 300 MG capsule Take 300 mg by mouth 3 (three) times daily.    HYDROcodone-acetaminophen (NORCO/VICODIN) 5-325 MG tablet Take 1 tablet by mouth every 6 (six) hours as needed  for severe pain.    hydrOXYzine (ATARAX/VISTARIL) 25 MG tablet Take 25 mg by mouth 2 (two) times daily as needed for itching or anxiety.    levothyroxine (SYNTHROID) 175 MCG tablet Take 175 mcg by mouth daily before breakfast.    nortriptyline (PAMELOR) 25 MG capsule Take 25 mg by mouth at bedtime.    NOVOLOG MIX 70/30 FLEXPEN (70-30) 100 UNIT/ML FlexPen Inject 40-45 Units into the skin See admin instructions. Inject 45 units subcutaneously in the morning & inject 40 units subcutaneously in the evening. 07/11/2020: 28 units,2000.07/10/20   nystatin cream (MYCOSTATIN) APPLY TO AFFECTED AREA TWICE A DAY    pantoprazole (PROTONIX) 40 MG tablet Take 1 tablet (40 mg total) by mouth daily before breakfast.    rosuvastatin (CRESTOR) 40 MG tablet Take 40 mg by mouth daily.    silver sulfADIAZINE (SILVADENE) 1 % cream Apply 1 application topically daily. (Patient taking differently: Apply 1 application topically daily as needed (wound care).)    traMADol (ULTRAM) 50 MG tablet TAKE 1 TABLET BY MOUTH EVERY 12 HOURS AS NEEDED (Patient taking differently: Take 50 mg by mouth 2 (two) times daily.)    No facility-administered encounter medications on file as of 11/21/2020.    Past Medical History:  Diagnosis Date   Arthritis    back   Carpal tunnel syndrome    both wrists   Diabetes mellitus  without complication (HCC)    Dr. Lurene ShadowBallen   GERD (gastroesophageal reflux disease)    History of kidney stones    Hypothyroidism    Kidney stone    Neuropathy    feet and hands   Thyroid disease     Past Surgical History:  Procedure Laterality Date   BACK SURGERY  1990s   CYSTOSCOPY/URETEROSCOPY/HOLMIUM LASER/STENT PLACEMENT Right 06/20/2020   Procedure: CYSTOSCOPY, RIGHT RETROGRADE /STENT PLACEMENT;  Surgeon: Bjorn PippinWrenn, Giovana Faciane, MD;  Location: [facility];  Service: Urology;  Laterality: Right;   CYSTOSCOPY/URETEROSCOPY/HOLMIUM LASER/STENT PLACEMENT Right 07/11/2020   Procedure: CYSTOSCOPY RIGHT  URETEROSCOPY/HOLMIUM LASER/STENT EXCHANGE;  Surgeon: Bjorn PippinWrenn, Servando Kyllonen, MD;  Location: [facility];  Service: Urology;  Laterality: Right;   FEMUR IM NAIL Left 10/03/2013   Procedure: INTRAMEDULLARY (IM) RETROGRADE FEMORAL NAILING;  Surgeon: Eldred MangesMark C Yates, MD;  Location: [facility];  Service: Orthopedics;  Laterality: Left;   tubes in ears      Social History   Socioeconomic History   Marital status: Single    Spouse name: Not on file   Number of children: Not on file   Years of education: 12   Highest education level: Not on file  Occupational History   Not on file  Tobacco Use   Smoking status: Never Smoker   Smokeless tobacco: Never Used  Vaping Use   Vaping Use: Never used  Substance and Sexual Activity   Alcohol use: No   Drug use: No   Sexual activity: Never    Birth control/protection: None  Other Topics Concern   Not on file  Social History Narrative   Patient lives at home with her parents [place] and 73[address]my.   Patient has a high school certificate.   Right handed.   Caffeine coke cola sometimes.            Social Determinants of Health   Financial Resource Strain: Not on file  Food Insecurity: Not on file  Transportation Needs: Not on file  Physical Activity: Not on file  Stress: Not on file  Social Connections: Not on file  Intimate Partner Violence: Not on file    Family History  Problem Relation Age of Onset   Thyroid disease Mother    Kidney Stones Father    Heart attack Paternal Grandmother    Thyroid disease Maternal Grandmother    Other Maternal Grandmother        heart issue   Heart attack Maternal Grandfather    Diabetes Maternal Grandfather    Colon cancer Maternal Grandfather    Cancer Maternal Grandfather        prostate       Objective: Vitals:   11/21/20 1411  BP: 99/67  Pulse: (!) 116  Temp: 99.7 F (37.6 C)     Physical Exam  Lab Results:  Results for orders placed or performed in visit on 11/21/20 (from the  past 24 hour(s))  Urinalysis, Routine w reflex microscopic     Status: Abnormal   Collection Time: 11/21/20  2:09 PM  Result Value Ref Range   Specific Gravity, UA 1.020 1.005 - 1.030   pH, UA 5.5 5.0 - 7.5   Color, UA Yellow Yellow   Appearance Ur Clear Clear   Leukocytes,UA 1+ (A) Negative   Protein,UA Negative Negative/Trace   Glucose, UA Negative Negative   Ketones, UA Negative Negative   RBC, UA Negative Negative   Bilirubin, UA Negative Negative   Urobilinogen, Ur 0.2 0.2 - 1.0 mg/dL   Nitrite,  UA Negative Negative   Microscopic Examination See below:    Narrative   Performed at:  [address] - [facility] [address] [place], [place]  [id number] Lab Director: Chinita Pester MT, Phone:  [phone]  Microscopic Examination     Status: Abnormal   Collection Time: 11/21/20  2:09 PM   Urine  Result Value Ref Range   WBC, UA 11-30 (A) 0 - 5 /hpf   RBC 0-2 0 - 2 /hpf   Epithelial Cells (non renal) 0-10 0 - 10 /hpf   Renal Epithel, UA None seen None seen /hpf   Bacteria, UA Moderate (A) None seen/Few   Narrative   Performed at:  [address] - [facility] [address], [place], [place]  [id number] Lab Director: Chinita Pester MT, Phone:  [phone]    BMET No results for input(s): NA, K, CL, CO2, GLUCOSE, BUN, CREATININE, CALCIUM in the last 72 hours. PSA No results found for: PSA No results found for: TESTOSTERONE    Studies/Results: I have reviewed her last several CT scans.  There were no stones in 2010 and have progressively increased particularly over the last couple of years.   Narrative & Impression  CLINICAL DATA:  Urinary stone, status post right ureteroscopy  EXAM: CT ABDOMEN AND PELVIS WITHOUT CONTRAST  TECHNIQUE: Multidetector CT imaging of the abdomen and pelvis was performed following the standard protocol without IV contrast.  COMPARISON:  03/22/2020  FINDINGS: Lower chest: Lung bases are clear.  Hepatobiliary: Unenhanced liver is  notable for moderate hepatic steatosis. Riedel's lobe configuration.  Gallbladder is unremarkable. No intrahepatic or extrahepatic ductal dilatation.  Pancreas: Within normal limits.  Spleen: Within normal limits.  Adrenals/Urinary Tract: Adrenal glands Within normal limits.  4 mm nonobstructing left lower pole renal calculus (series 2/image 30). Three nonobstructing right lower pole renal calculi measuring up to 12 x 5 mm (series 2/image 37). Two calculi in the right upper pole proximal collecting system measuring up to 8 mm (series 2/image 29).  Bladder is underdistended and unremarkable.  Stomach/Bowel: Stomach is within normal limits.  No evidence of bowel obstruction.  Normal appendix (series 2/image 60).  Vascular/Lymphatic: No evidence of abdominal aortic aneurysm.  Atherosclerotic calcifications of the abdominal aorta and branch vessels.  No suspicious abdominopelvic lymphadenopathy.  Reproductive: Uterus is within normal limits.  No adnexal masses.  Other: Moderate complex periumbilical hernia containing fat and multiple loops of nondilated small bowel (series 2/image 74).  No abdominopelvic ascites.  Musculoskeletal: Degenerative changes of the visualized thoracolumbar spine. Status post ORIF of the left femoral shaft, incompletely visualized.  IMPRESSION: Multiple nonobstructing bilateral renal calculi, measuring up to 12 x 5 mm in the right lower pole and 8 mm in the right upper pole proximal collecting system. No hydronephrosis.  Additional ancillary findings as above.   Electronically Signed   By: Charline Bills M.D.   On: 08/28/2020 16:12      Assessment & Plan: Right renal stones doing well s/p right URS.   The repeat CT showed a reduced stone burden but persistent stones.  I discussed observation vs a second procedure and she would like to return in 31mo with an X-ray to hopefully get through the COVID surge before  doing anything.  She has a small amount of stone in the left kidney.    Pyuria with bacteria.  She has no UTI symptoms but I will get a culture to make sure there is not an active infection.   No orders of  the defined types were placed in this encounter.    Orders Placed This Encounter  Procedures   Microscopic Examination   Urine Culture   DG Abd 1 View    Standing Status:   Future    Standing Expiration Date:   03/21/2021    Order Specific Question:   Reason for Exam (SYMPTOM  OR DIAGNOSIS REQUIRED)    Answer:   renal stones    Order Specific Question:   Preferred imaging location?    Answer:   [facility]    Order Specific Question:   Radiology Contrast Protocol - do NOT remove file path    Answer:   [email or web address]    Order Specific Question:   Is patient pregnant?    Answer:   Unknown (Please Explain)    Comments:   not tested   Urinalysis, Routine w reflex microscopic      Return in about 3 months (around 02/19/2021) for with KUB.   CC: Marylynn Pearson, FNP  And Dr. Lionel December.    Bjorn Pippin 11/22/2020

## 2020-11-23 LAB — URINE CULTURE

## 2020-11-25 ENCOUNTER — Telehealth: Payer: Self-pay

## 2020-11-25 NOTE — Telephone Encounter (Signed)
Pt called and made aware

## 2020-11-25 NOTE — Telephone Encounter (Signed)
-----   Message from Bjorn Pippin, MD sent at 11/25/2020 11:21 AM EST ----- Mx species.  No treatment.

## 2020-12-15 IMAGING — CT CT RENAL STONE PROTOCOL
2 of 5 series · 16 of 46 positions shown, 18 images · non-contrast
Comparison: 03/22/2020

CLINICAL DATA: Urinary stone, status post right ureteroscopy

EXAM:
CT ABDOMEN AND PELVIS WITHOUT CONTRAST
TECHNIQUE: Multidetector CT imaging of the abdomen and pelvis was performed
following the standard protocol without IV contrast.

[Series 2: axial st · axial · 0.93mm/px · z∈[+1052,+1422]mm · 13 of 84 slices shown, 15 images]
[im 5/84  soft-tissue]
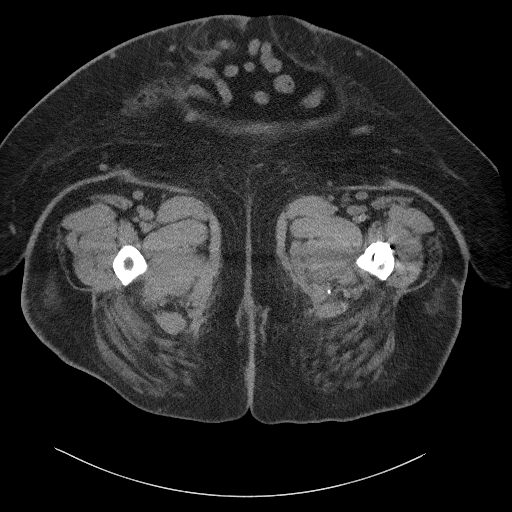
[im 5/84  bone]
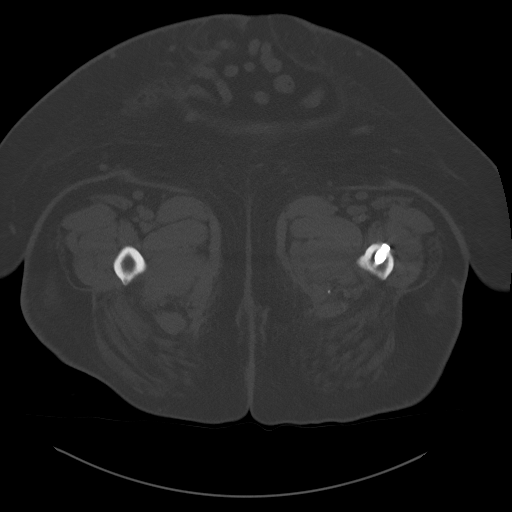
[im 10/84  soft-tissue]
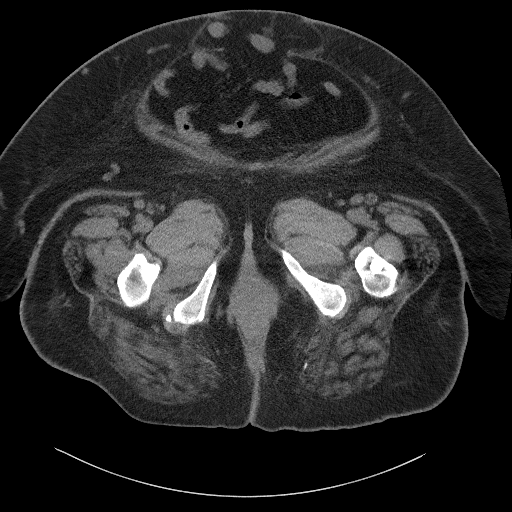
[im 19/84  soft-tissue]
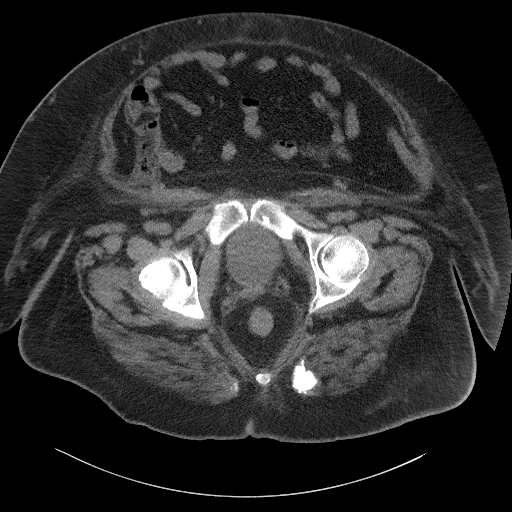
[im 24/84  soft-tissue]
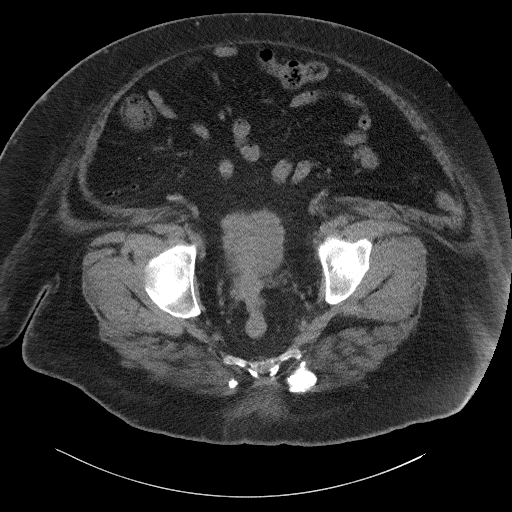
[im 28/84  soft-tissue]
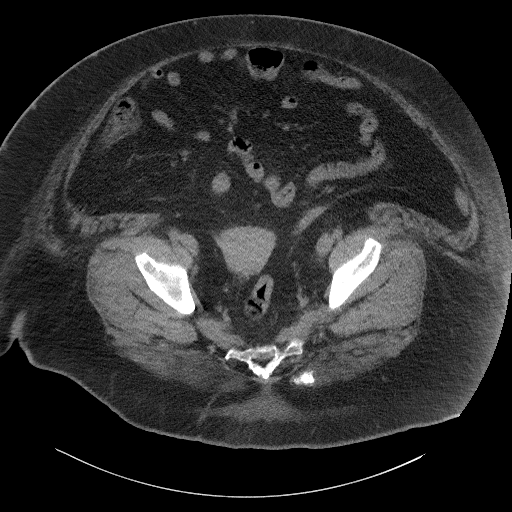
[im 37/84  soft-tissue]
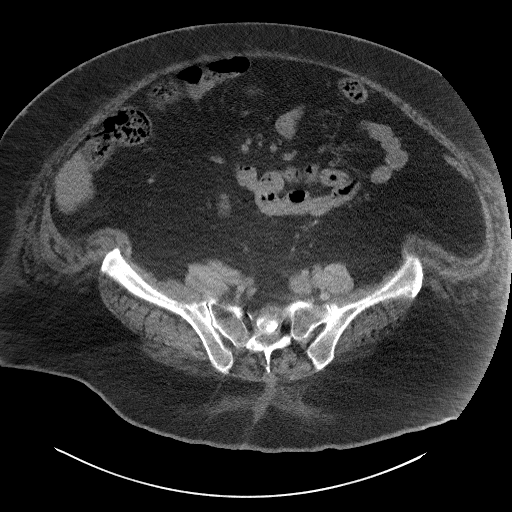
[im 42/84  soft-tissue]
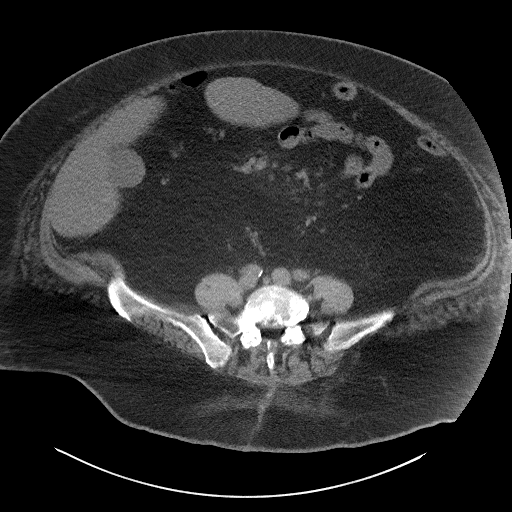
[im 47/84  soft-tissue]
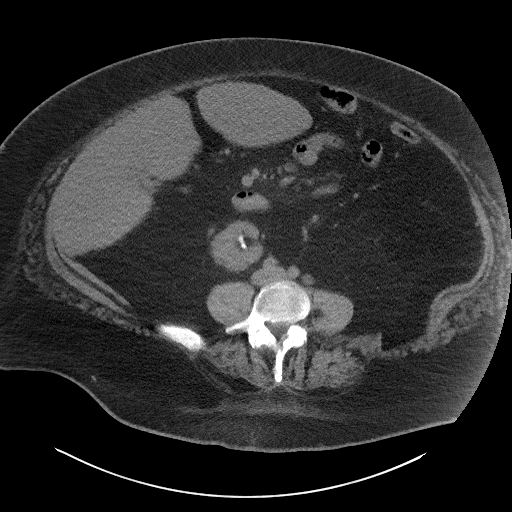
[im 56/84  soft-tissue]
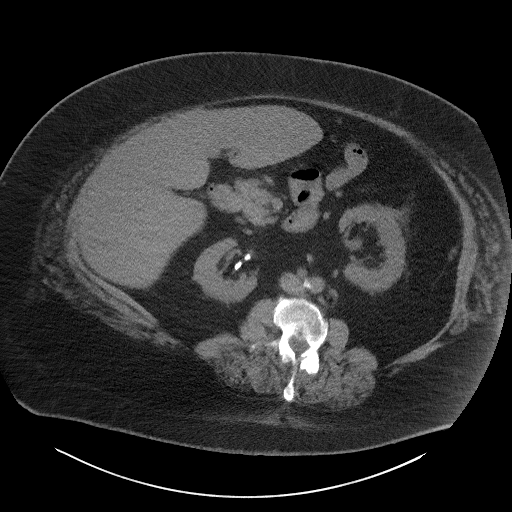
[im 56/84  bone]
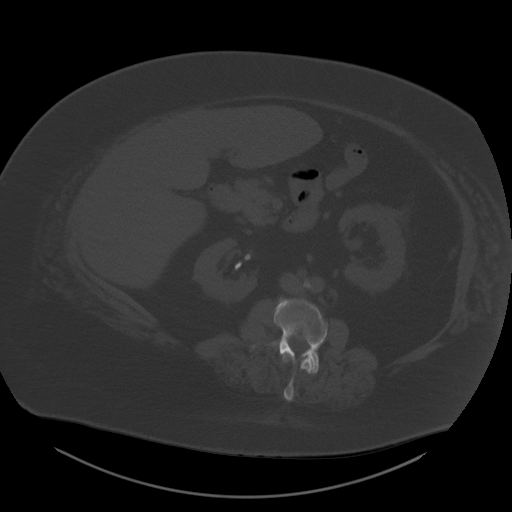
[im 60/84  soft-tissue]
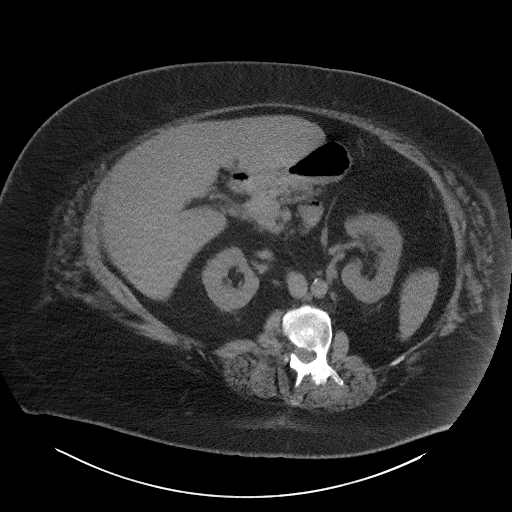
[im 65/84  soft-tissue]
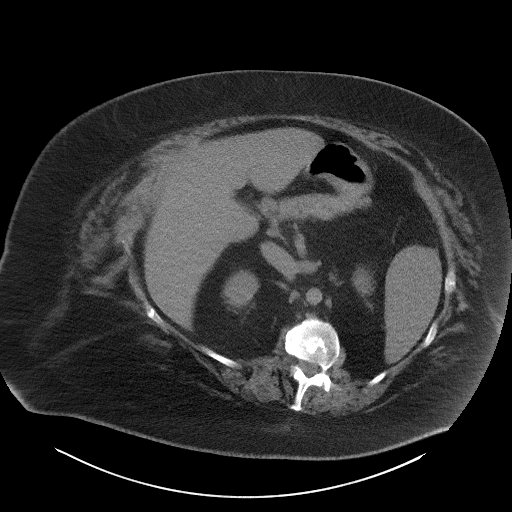
[im 74/84  soft-tissue]
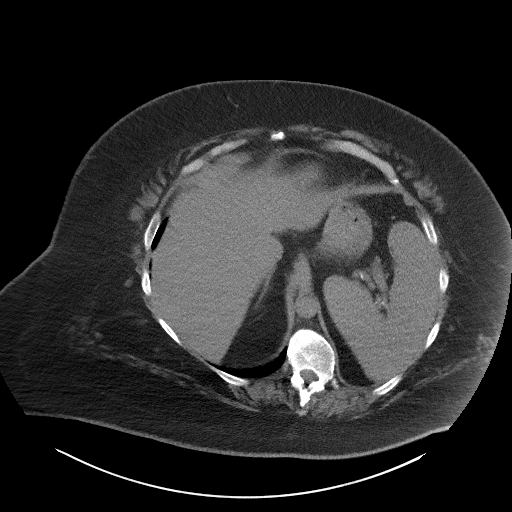
[im 79/84  soft-tissue]
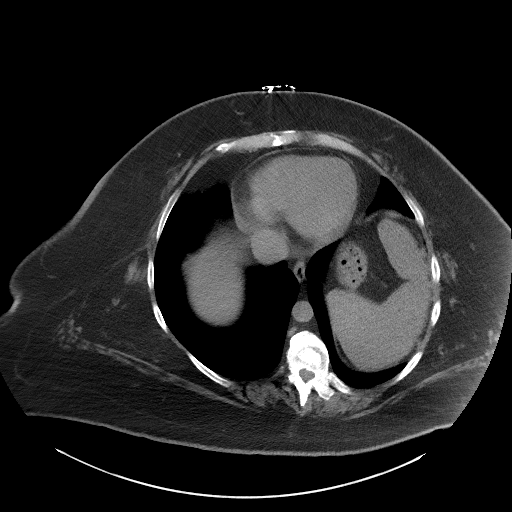

[Series 5: coronal st · coronal · 0.88mm/px · 3 of 132 slices shown]
[im 44/132  soft-tissue]
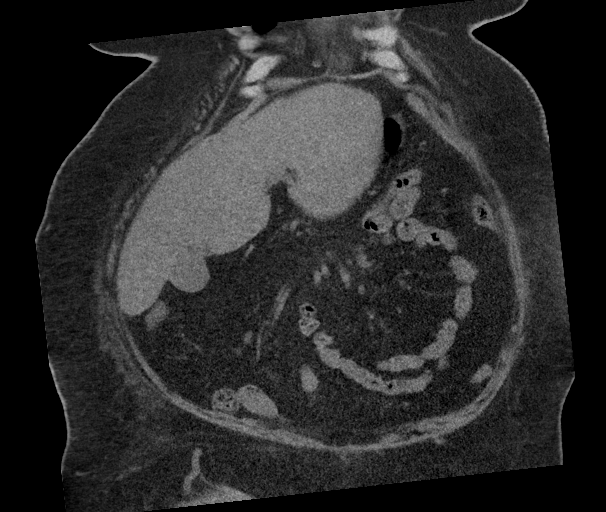
[im 59/132  soft-tissue]
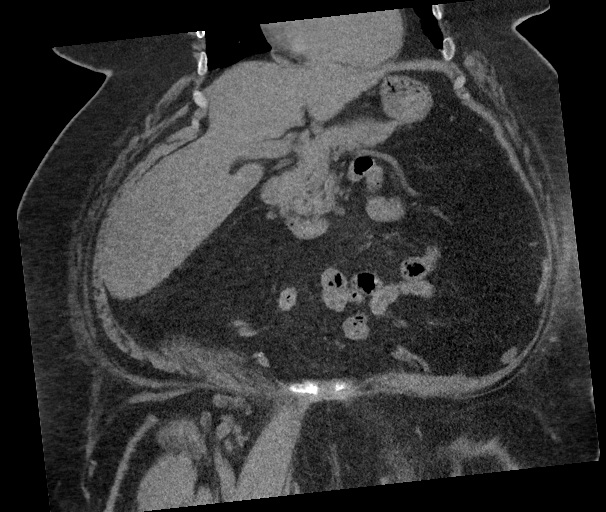
[im 73/132  soft-tissue]
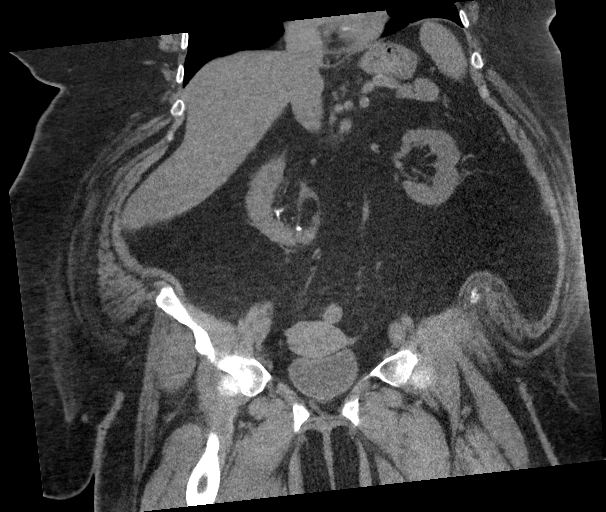

[16 of 46 positions shown; findings below may reference images not displayed]

FINDINGS: Lower chest: Lung bases are clear.

Hepatobiliary: Unenhanced liver is notable for moderate hepatic
steatosis. Riedel's lobe configuration.

Gallbladder is unremarkable. No intrahepatic or extrahepatic ductal
dilatation.

Pancreas: Within normal limits.

Spleen: Within normal limits.

Adrenals/Urinary Tract: Adrenal glands Within normal limits.

4 mm nonobstructing left lower pole renal calculus (series 2/image
30). Three nonobstructing right lower pole renal calculi measuring
up to 12 x 5 mm (series 2/image 37). Two calculi in the right upper
pole proximal collecting system measuring up to 8 mm (series 2/image
29).

Bladder is underdistended and unremarkable.

Stomach/Bowel: Stomach is within normal limits.

No evidence of bowel obstruction.

Normal appendix (series 2/image 60).

Vascular/Lymphatic: No evidence of abdominal aortic aneurysm.

Atherosclerotic calcifications of the abdominal aorta and branch
vessels.

No suspicious abdominopelvic lymphadenopathy.

Reproductive: Uterus is within normal limits.

No adnexal masses.

Other: Moderate complex periumbilical hernia containing fat and
multiple loops of nondilated small bowel (series 2/image 74).

No abdominopelvic ascites.

Musculoskeletal: Degenerative changes of the visualized
thoracolumbar spine. Status post ORIF of the left femoral shaft,
incompletely visualized.
IMPRESSION: Multiple nonobstructing bilateral renal calculi, measuring up to 12
x 5 mm in the right lower pole and 8 mm in the right upper pole
proximal collecting system. No hydronephrosis.

Additional ancillary findings as above.

## 2021-02-20 ENCOUNTER — Ambulatory Visit (HOSPITAL_COMMUNITY)
Admission: RE | Admit: 2021-02-20 | Discharge: 2021-02-20 | Disposition: A | Discharge status: Home / Self Care | Payer: Medicaid Other | Source: Ambulatory Visit | Attending: Urology | Admitting: Urology

## 2021-02-20 ENCOUNTER — Other Ambulatory Visit: Payer: Self-pay

## 2021-02-20 ENCOUNTER — Encounter: Payer: Self-pay | Admitting: Urology

## 2021-02-20 ENCOUNTER — Ambulatory Visit (INDEPENDENT_AMBULATORY_CARE_PROVIDER_SITE_OTHER): Payer: Medicaid Other | Admitting: Urology

## 2021-02-20 VITALS — BP 96/63 | HR 109 | Temp 99.3°F | Ht 62.0 in | Wt 298.0 lb

## 2021-02-20 DIAGNOSIS — N200 Calculus of kidney: Secondary | ICD-10-CM | POA: Diagnosis present

## 2021-02-20 DIAGNOSIS — R8271 Bacteriuria: Secondary | ICD-10-CM

## 2021-02-20 DIAGNOSIS — R8281 Pyuria: Secondary | ICD-10-CM | POA: Diagnosis not present

## 2021-02-20 LAB — URINALYSIS, ROUTINE W REFLEX MICROSCOPIC
Bilirubin, UA: NEGATIVE
Glucose, UA: NEGATIVE
Ketones, UA: NEGATIVE
Nitrite, UA: NEGATIVE
Protein,UA: NEGATIVE
Specific Gravity, UA: 1.02 (ref 1.005–1.030)
Urobilinogen, Ur: 0.2 mg/dL (ref 0.2–1.0)
pH, UA: 5.5 (ref 5.0–7.5)

## 2021-02-20 LAB — MICROSCOPIC EXAMINATION: Renal Epithel, UA: NONE SEEN /hpf

## 2021-02-20 NOTE — Progress Notes (Signed)
Urological Symptom Review  Patient is experiencing the following symptoms: Kidney stones   Review of Systems  Gastrointestinal (upper)  : Negative for upper GI symptoms  Gastrointestinal (lower) : Negative for lower GI symptoms  Constitutional : Negative for symptoms  Skin: Itching  Eyes: Negative for eye symptoms  Ear/Nose/Throat : Negative for Ear/Nose/Throat symptoms  Hematologic/Lymphatic: Negative for Hematologic/Lymphatic symptoms  Cardiovascular : Negative for cardiovascular symptoms  Respiratory : Negative for respiratory symptoms  Endocrine: Negative for endocrine symptoms  Musculoskeletal: Back pain  Neurological: Negative for neurological symptoms  Psychologic: Negative for psychiatric symptoms

## 2021-02-20 NOTE — Progress Notes (Signed)
Subjective:  1. Renal stones   2. Bacteriuria with pyuria      Alexandra Kelley returns today in f/u for history of stones.  She had a right ureteroscopy for multiple right renal stones on 07/11/20.  She had her stent removed on 07/16/20.   Her stones were calcium oxalate.  She had a post op CT in 10/21 and had multiple residual non-obstructing right renal and left renal stones but the stone burden is reduced.  A KUB prior to this visit shows stable stones with the largest about 68mm in the RLP.  The report is pending.   Her UA has 11-30 WBC and many bacteria which is stable and a culture at her last visit grew Mx  Species.   She has had no flank pain or hematoma.     ROS:  ROS:  A complete review of systems was performed.  All systems are negative except for pertinent findings as noted.   ROS  Allergies  Allergen Reactions  . 5-Alpha Reductase Inhibitors   . Ceclor [Cefaclor]     Rash  . Invokana [Canagliflozin]     Severe yeast infections  . Metformin And Related     GI distress  . Onglyza [Saxagliptin]     headaches  . Zofran [Ondansetron Hcl] Other (See Comments)    Itchy rash in mouth  . Penicillins Rash    Has patient had a PCN reaction causing immediate rash, facial/tongue/throat swelling, SOB or lightheadedness with hypotension: No Has patient had a PCN reaction causing severe rash involving mucus membranes or skin necrosis: No Has patient had a PCN reaction that required hospitalization: No Has patient had a PCN reaction occurring within the last 10 years: Yes If all of the above answers are "NO", then may proceed with Cephalosporin use.   . Pregabalin Itching and Rash  . Sitagliptin Rash    Outpatient Encounter Medications as of 02/20/2021  Medication Sig Note  . amitriptyline (ELAVIL) 25 MG tablet Take 25 mg by mouth at bedtime.   . dicyclomine (BENTYL) 10 MG capsule Take 1 capsule (10 mg total) by mouth 3 (three) times daily as needed for spasms. (Patient taking  differently: Take 10 mg by mouth every 8 (eight) hours as needed (stomach spasms.).)   . gabapentin (NEURONTIN) 300 MG capsule Take 300 mg by mouth 3 (three) times daily.   Marland Kitchen HYDROcodone-acetaminophen (NORCO/VICODIN) 5-325 MG tablet Take 1 tablet by mouth every 6 (six) hours as needed for severe pain.   . hydrOXYzine (ATARAX/VISTARIL) 25 MG tablet Take 25 mg by mouth 2 (two) times daily as needed for itching or anxiety.   Marland Kitchen levothyroxine (SYNTHROID) 175 MCG tablet Take 175 mcg by mouth daily before breakfast.   . NOVOLOG MIX 70/30 FLEXPEN (70-30) 100 UNIT/ML FlexPen Inject 40-45 Units into the skin See admin instructions. Inject 45 units subcutaneously in the morning & inject 40 units subcutaneously in the evening. 07/11/2020: 28 units,2000.07/10/20  . nystatin cream (MYCOSTATIN) APPLY TO AFFECTED AREA TWICE A DAY   . pantoprazole (PROTONIX) 40 MG tablet Take 1 tablet (40 mg total) by mouth daily before breakfast.   . rosuvastatin (CRESTOR) 40 MG tablet Take 40 mg by mouth daily.   . silver sulfADIAZINE (SILVADENE) 1 % cream Apply 1 application topically daily. (Patient taking differently: Apply 1 application topically daily as needed (wound care).)   . traMADol (ULTRAM) 50 MG tablet TAKE 1 TABLET BY MOUTH EVERY 12 HOURS AS NEEDED (Patient taking differently: Take 50 mg by mouth  2 (two) times daily.)   . [DISCONTINUED] nortriptyline (PAMELOR) 25 MG capsule Take 25 mg by mouth at bedtime.    No facility-administered encounter medications on file as of 02/20/2021.    Past Medical History:  Diagnosis Date  . Arthritis    back  . Carpal tunnel syndrome    both wrists  . Diabetes mellitus without complication (HCC)    Dr. Lurene Shadow  . GERD (gastroesophageal reflux disease)   . History of kidney stones   . Hypothyroidism   . Kidney stone   . Neuropathy    feet and hands  . Thyroid disease     Past Surgical History:  Procedure Laterality Date  . BACK SURGERY  1990s  .  CYSTOSCOPY/URETEROSCOPY/HOLMIUM LASER/STENT PLACEMENT Right 06/20/2020   Procedure: CYSTOSCOPY, RIGHT RETROGRADE /STENT PLACEMENT;  Surgeon: Bjorn Pippin, MD;  Location: [facility];  Service: Urology;  Laterality: Right;  . CYSTOSCOPY/URETEROSCOPY/HOLMIUM LASER/STENT PLACEMENT Right 07/11/2020   Procedure: CYSTOSCOPY RIGHT URETEROSCOPY/HOLMIUM LASER/STENT EXCHANGE;  Surgeon: Bjorn Pippin, MD;  Location: [facility];  Service: Urology;  Laterality: Right;  . FEMUR IM NAIL Left 10/03/2013   Procedure: INTRAMEDULLARY (IM) RETROGRADE FEMORAL NAILING;  Surgeon: Eldred Manges, MD;  Location: [facility];  Service: Orthopedics;  Laterality: Left;  . tubes in ears      Social History   Socioeconomic History  . Marital status: Single    Spouse name: Not on file  . Number of children: Not on file  . Years of education: 93  . Highest education level: Not on file  Occupational History  . Not on file  Tobacco Use  . Smoking status: Never Smoker  . Smokeless tobacco: Never Used  Vaping Use  . Vaping Use: Never used  Substance and Sexual Activity  . Alcohol use: No  . Drug use: No  . Sexual activity: Never    Birth control/protection: None  Other Topics Concern  . Not on file  Social History Narrative   Patient lives at home with her parents [place] and [place].   Patient has a high school certificate.   Right handed.   Caffeine coke cola sometimes.            Social Determinants of Health   Financial Resource Strain: Not on file  Food Insecurity: Not on file  Transportation Needs: Not on file  Physical Activity: Not on file  Stress: Not on file  Social Connections: Not on file  Intimate Partner Violence: Not on file    Family History  Problem Relation Age of Onset  . Thyroid disease Mother   . Kidney Stones Father   . Heart attack Paternal Grandmother   . Thyroid disease Maternal Grandmother   . Other Maternal Grandmother        heart issue  . Heart attack Maternal Grandfather   . Diabetes  Maternal Grandfather   . Colon cancer Maternal Grandfather   . Cancer Maternal Grandfather        prostate       Objective: Vitals:   02/20/21 1549  BP: 96/63  Pulse: (!) 109  Temp: 99.3 F (37.4 C)     Physical Exam  Lab Results:  Results for orders placed or performed in visit on 02/20/21 (from the past 24 hour(s))  Urinalysis, Routine w reflex microscopic     Status: Abnormal   Collection Time: 02/20/21  3:52 PM  Result Value Ref Range   Specific Gravity, UA 1.020 1.005 - 1.030   pH, UA 5.5  5.0 - 7.5   Color, UA Yellow Yellow   Appearance Ur Clear Clear   Leukocytes,UA 1+ (A) Negative   Protein,UA Negative Negative/Trace   Glucose, UA Negative Negative   Ketones, UA Negative Negative   RBC, UA Trace (A) Negative   Bilirubin, UA Negative Negative   Urobilinogen, Ur 0.2 0.2 - 1.0 mg/dL   Nitrite, UA Negative Negative   Microscopic Examination See below:    Narrative   Performed at:  [address] - [facility] [address] [place], [place]  [id number] Lab Director: Chinita Pester MT, Phone:  [phone]  Microscopic Examination     Status: Abnormal   Collection Time: 02/20/21  3:52 PM   Urine  Result Value Ref Range   WBC, UA 11-30 (A) 0 - 5 /hpf   RBC 0-2 0 - 2 /hpf   Epithelial Cells (non renal) 0-10 0 - 10 /hpf   Renal Epithel, UA None seen None seen /hpf   Bacteria, UA Many (A) None seen/Few   Narrative   Performed at:  [address] - [facility] [address] [place] [address], [place], [place]  [id number] Lab Director: Chinita Pester MT, Phone:  [phone]    BMET No results for input(s): NA, K, CL, CO2, GLUCOSE, BUN, CREATININE, CALCIUM in the last 72 hours. PSA No results found for: PSA No results found for: TESTOSTERONE    Studies/Results: I have reviewed her KUB and the prior CT.     UA reviewed.    Assessment & Plan: Right renal stones doing well s/p right URS.   The repeat CT showed a reduced stone burden but persistent stones and the KUB today  shows stable to minimally increased stone burden.  I will have her return in 6 months with a KUB and renal US.  She has a small amount of stone in the left kidney.    Pyuria with bacteria.  She has no UTI symptoms and her last culture grew Mx Species.    No orders of the defined types were placed in this encounter.    Orders Placed This Encounter  Procedures  . Microscopic Examination  . DG Abd 1 View    Standing Status:   Future    Standing Expiration Date:   02/20/2022    Order Specific Question:   Reason for Exam (SYMPTOM  OR DIAGNOSIS REQUIRED)    Answer:   renal stones    Order Specific Question:   Preferred imaging location?    Answer:   [facility]    Order Specific Question:   Radiology Contrast Protocol - do NOT remove file path    Answer:   [email or web address]    Order Specific Question:   Is patient pregnant?    Answer:   No  . US RENAL    Standing Status:   Future    Standing Expiration Date:   02/20/2022    Order Specific Question:   Reason for Exam (SYMPTOM  OR DIAGNOSIS REQUIRED)    Answer:   renal stone    Order Specific Question:   Preferred imaging location?    Answer:   [facility]  . Urinalysis, Routine w reflex microscopic      Return in about 6 months (around 08/22/2021) for with KUB and renal US.   CC: Marylynn Pearson, FNP  And Dr. Lionel December.    Bjorn Pippin 02/20/2021 Patient ID: [name], female   DOB: 04-19-1981, 40 y.o.   MRN: [id number]

## 2021-03-06 ENCOUNTER — Other Ambulatory Visit (INDEPENDENT_AMBULATORY_CARE_PROVIDER_SITE_OTHER): Payer: Self-pay | Admitting: Internal Medicine

## 2021-04-28 ENCOUNTER — Telehealth: Payer: Self-pay | Admitting: Adult Health

## 2021-04-28 MED ORDER — NYSTATIN 100000 UNIT/GM EX CREA: TOPICAL_CREAM | CUTANEOUS | 3 refills | Status: DC

## 2021-04-28 NOTE — Telephone Encounter (Signed)
Refilled nystatin, mom aware

## 2021-04-28 NOTE — Addendum Note (Signed)
Addended by: Cyril Mourning A on: 04/28/2021 05:02 PM   Modules accepted: Orders

## 2021-04-28 NOTE — Telephone Encounter (Signed)
Patient's mom called stating that she would like for Alexandra Kelley to call her daughter a refill of her Nystatin Cream sent to the [facility] pharmacy. Please contact pt's mom

## 2021-06-09 IMAGING — DX DG ABDOMEN 1V
2 series · 2 of 2 positions shown · non-contrast
Comparison: 08/28/2020 CT
COMPARISON: 08/28/2020 CT

Addendum:
CLINICAL DATA: History of renal stones

EXAM:
ABDOMEN - 1 VIEW

[abdomen kub (1 of 2)]
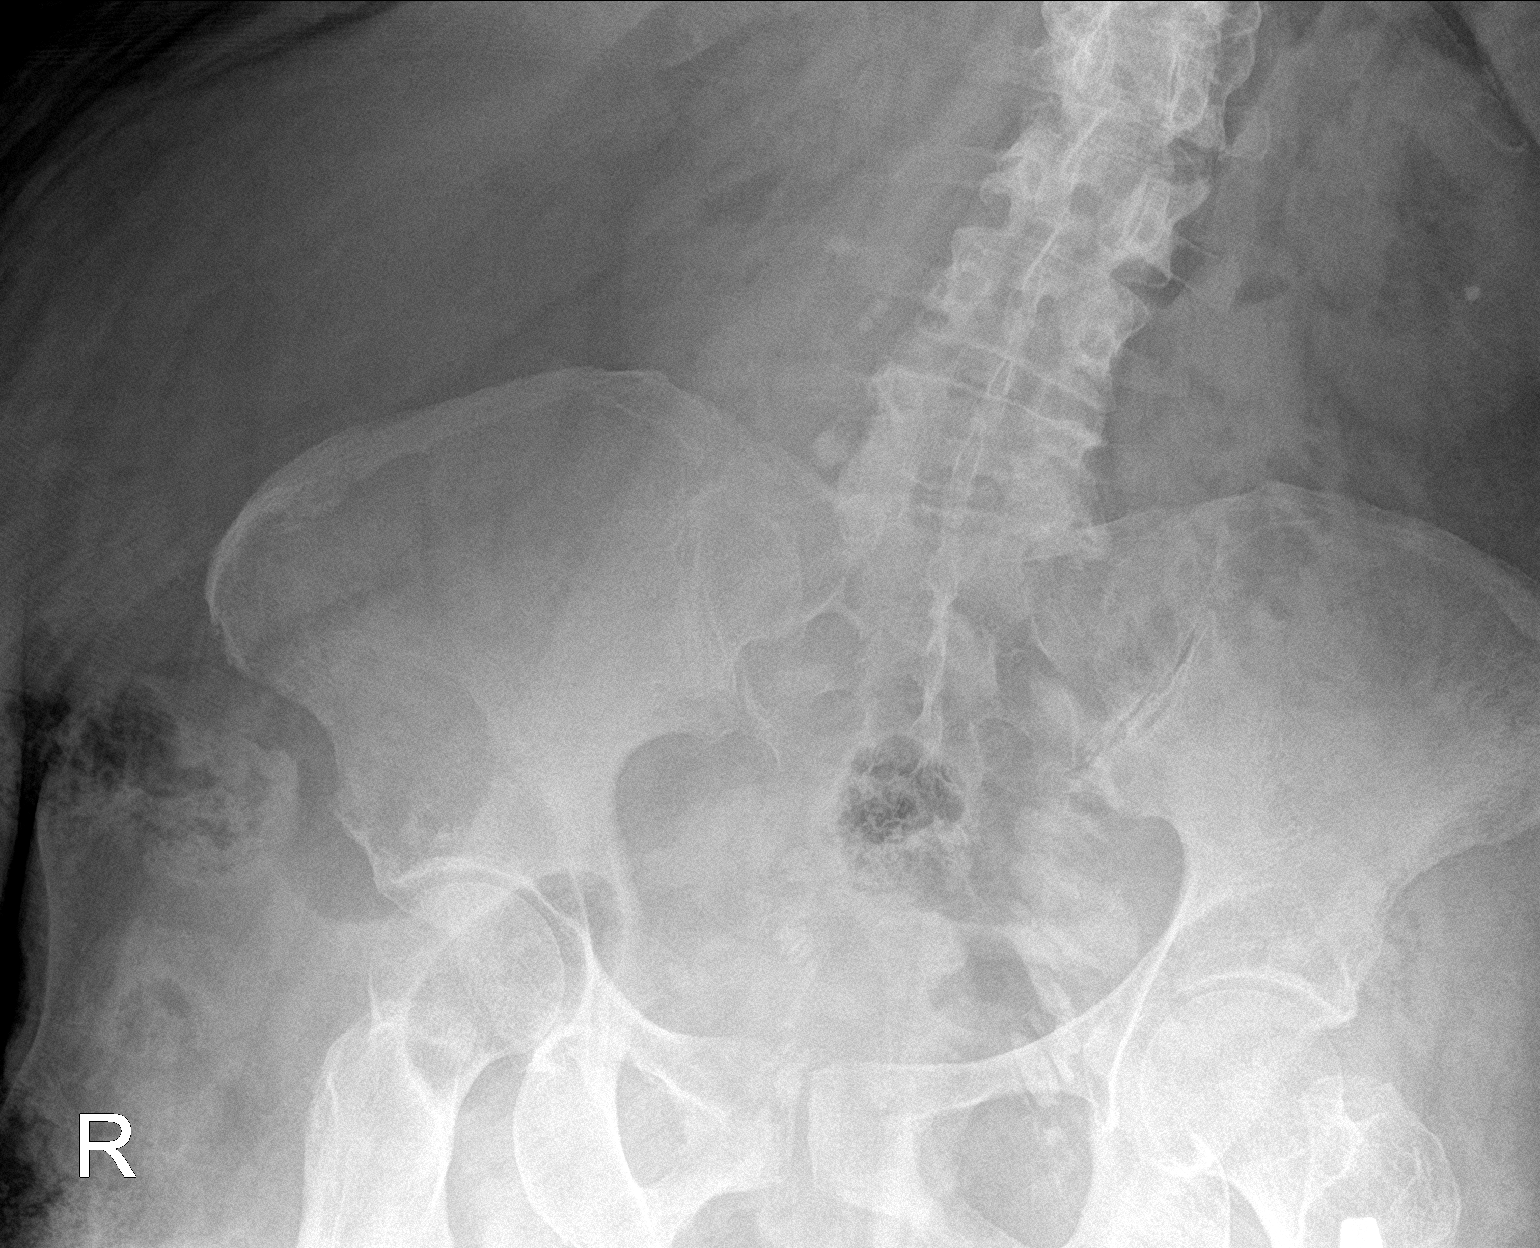

[abdomen kub (2 of 2)]
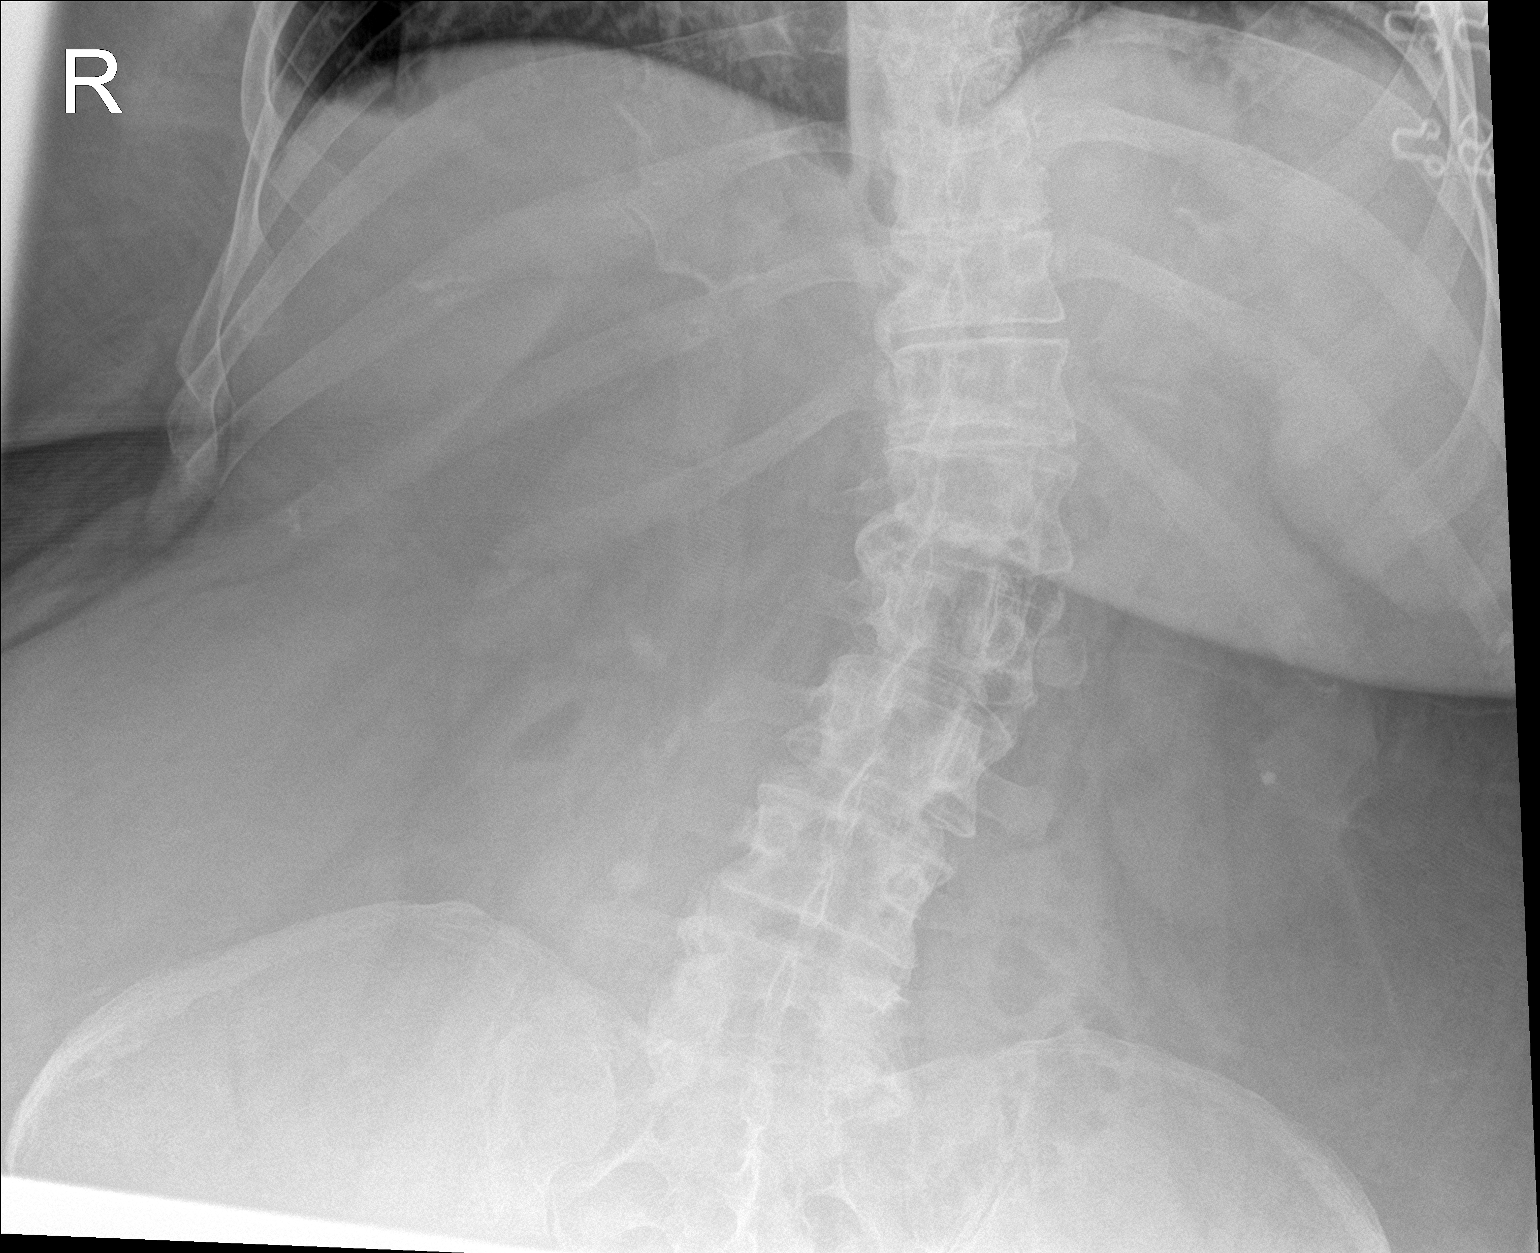

[2 of 2 positions shown; findings below may reference images not displayed]

FINDINGS: Scattered large and small bowel gas is noted. Bilateral renal
calculi are noted similar to that seen on prior CT examination.
There is a calcification to the right of the spine at the L4-5 level
which may represent a ureteral stone. CT urography may be helpful
for further evaluation. Degenerative changes of the thoracolumbar
spine are noted with scoliosis concave to the right.
IMPRESSION: Bilateral renal calculi.

Calcification to the right of the spine as described suspicious for
ureteral stone. CT may be helpful for further evaluation.

ADDENDUM:
Given the degree of rotation the stone adjacent to the L4-5
interspace may still lie within the lower pole. Correlation with the
patient's symptomatology is recommended. The need for further
evaluation can be determined on a clinical basis.

*** End of Addendum ***
FINDINGS: Scattered large and small bowel gas is noted. Bilateral renal
calculi are noted similar to that seen on prior CT examination.
There is a calcification to the right of the spine at the L4-5 level
which may represent a ureteral stone. CT urography may be helpful
for further evaluation. Degenerative changes of the thoracolumbar
spine are noted with scoliosis concave to the right.
IMPRESSION: Bilateral renal calculi.

Calcification to the right of the spine as described suspicious for
ureteral stone. CT may be helpful for further evaluation.

## 2021-08-05 LAB — HEMOGLOBIN A1C: Hemoglobin A1C: 6.8

## 2021-08-14 ENCOUNTER — Other Ambulatory Visit: Payer: Self-pay

## 2021-08-14 ENCOUNTER — Ambulatory Visit (HOSPITAL_COMMUNITY): Payer: Medicaid Other

## 2021-08-14 ENCOUNTER — Ambulatory Visit (HOSPITAL_COMMUNITY)
Admission: RE | Admit: 2021-08-14 | Discharge: 2021-08-14 | Disposition: A | Discharge status: Home / Self Care | Payer: Medicaid Other | Source: Ambulatory Visit | Attending: Urology | Admitting: Urology

## 2021-08-14 DIAGNOSIS — N200 Calculus of kidney: Secondary | ICD-10-CM | POA: Insufficient documentation

## 2021-08-21 ENCOUNTER — Other Ambulatory Visit: Payer: Self-pay

## 2021-08-21 ENCOUNTER — Ambulatory Visit (INDEPENDENT_AMBULATORY_CARE_PROVIDER_SITE_OTHER): Payer: Medicaid Other | Admitting: Urology

## 2021-08-21 VITALS — BP 103/65 | HR 112

## 2021-08-21 DIAGNOSIS — R8271 Bacteriuria: Secondary | ICD-10-CM

## 2021-08-21 DIAGNOSIS — N200 Calculus of kidney: Secondary | ICD-10-CM | POA: Diagnosis not present

## 2021-08-21 DIAGNOSIS — R8281 Pyuria: Secondary | ICD-10-CM | POA: Diagnosis not present

## 2021-08-21 LAB — URINALYSIS, ROUTINE W REFLEX MICROSCOPIC
Bilirubin, UA: NEGATIVE
Glucose, UA: NEGATIVE
Ketones, UA: NEGATIVE
Nitrite, UA: NEGATIVE
Protein,UA: NEGATIVE
Specific Gravity, UA: 1.01 (ref 1.005–1.030)
Urobilinogen, Ur: 0.2 mg/dL (ref 0.2–1.0)
pH, UA: 5.5 (ref 5.0–7.5)

## 2021-08-21 LAB — MICROSCOPIC EXAMINATION: Renal Epithel, UA: NONE SEEN /hpf

## 2021-08-21 NOTE — Progress Notes (Signed)
Subjective:  1. Bilateral renal stones   2. Bacteriuria with pyuria   3. Renal stones      Alexandra Kelley returns today in f/u for history of stones.  She had a right ureteroscopy for multiple right renal stones on 07/11/20.  She had her stent removed on 07/16/20.   Her stones were calcium oxalate.  She had a post op CT in 10/21 and had multiple residual non-obstructing right renal and left renal stones but the stone burden is reduced.  A KUB and renal US prior to this visit shows persistent stones with the largest about 40mm in the RLP.  The report is pending.   Her UA has 6-10 RBC WBC and mod bacteria which is stable and a previous culture at her last visit grew Mx  Species.   She has had no flank pain or hematoma.     ROS:  ROS:  A complete review of systems was performed.  All systems are negative except for pertinent findings as noted.   ROS  Allergies  Allergen Reactions   5-Alpha Reductase Inhibitors    Ceclor [Cefaclor]     Rash   Invokana [Canagliflozin]     Severe yeast infections   Melatonin Other (See Comments)   Metformin And Related     GI distress   Onglyza [Saxagliptin]     headaches   Statins Other (See Comments)   Zofran [Ondansetron Hcl] Other (See Comments)    Itchy rash in mouth   Penicillins Rash    Has patient had a PCN reaction causing immediate rash, facial/tongue/throat swelling, SOB or lightheadedness with hypotension: No Has patient had a PCN reaction causing severe rash involving mucus membranes or skin necrosis: No Has patient had a PCN reaction that required hospitalization: No Has patient had a PCN reaction occurring within the last 10 years: Yes If all of the above answers are "NO", then may proceed with Cephalosporin use.    Pregabalin Itching and Rash   Sitagliptin Rash    Outpatient Encounter Medications as of 08/21/2021  Medication Sig Note   amitriptyline (ELAVIL) 25 MG tablet Take 25 mg by mouth at bedtime.    dicyclomine (BENTYL) 10 MG  capsule Take 1 capsule (10 mg total) by mouth 3 (three) times daily as needed for spasms. (Patient taking differently: Take 10 mg by mouth every 8 (eight) hours as needed (stomach spasms.).)    gabapentin (NEURONTIN) 300 MG capsule Take 300 mg by mouth 3 (three) times daily.    hydrOXYzine (ATARAX/VISTARIL) 25 MG tablet Take 25 mg by mouth 2 (two) times daily as needed for itching or anxiety.    insulin aspart protamine - aspart (NOVOLOG MIX 70/30 FLEXPEN) (70-30) 100 UNIT/ML FlexPen 60u/50u    levothyroxine (SYNTHROID) 175 MCG tablet Take 175 mcg by mouth daily before breakfast.    nystatin cream (MYCOSTATIN) APPLY TO AFFECTED AREA TWICE A DAY    pantoprazole (PROTONIX) 40 MG tablet TAKE 1 TABLET BY MOUTH DAILY BEFORE BREAKFAST    rosuvastatin (CRESTOR) 40 MG tablet Take 40 mg by mouth daily.    silver sulfADIAZINE (SILVADENE) 1 % cream Apply 1 application topically daily. (Patient taking differently: Apply 1 application topically daily as needed (wound care).)    traMADol (ULTRAM) 50 MG tablet TAKE 1 TABLET BY MOUTH EVERY 12 HOURS AS NEEDED (Patient taking differently: Take 50 mg by mouth 2 (two) times daily.)    [DISCONTINUED] NOVOLOG MIX 70/30 FLEXPEN (70-30) 100 UNIT/ML FlexPen Inject 40-45 Units into the skin  See admin instructions. Inject 45 units subcutaneously in the morning & inject 40 units subcutaneously in the evening. 07/11/2020: 28 units,2000.07/10/20   No facility-administered encounter medications on file as of 08/21/2021.    Past Medical History:  Diagnosis Date   Arthritis    back   Carpal tunnel syndrome    both wrists   Diabetes mellitus without complication (HCC)    Dr. Lurene Shadow   GERD (gastroesophageal reflux disease)    History of kidney stones    Hypothyroidism    Kidney stone    Neuropathy    feet and hands   Thyroid disease     Past Surgical History:  Procedure Laterality Date   BACK SURGERY  1990s   CYSTOSCOPY/URETEROSCOPY/HOLMIUM LASER/STENT PLACEMENT Right  06/20/2020   Procedure: CYSTOSCOPY, RIGHT RETROGRADE /STENT PLACEMENT;  Surgeon: Bjorn Pippin, MD;  Location: [facility];  Service: Urology;  Laterality: Right;   CYSTOSCOPY/URETEROSCOPY/HOLMIUM LASER/STENT PLACEMENT Right 07/11/2020   Procedure: CYSTOSCOPY RIGHT URETEROSCOPY/HOLMIUM LASER/STENT EXCHANGE;  Surgeon: Bjorn Pippin, MD;  Location: [facility];  Service: Urology;  Laterality: Right;   FEMUR IM NAIL Left 10/03/2013   Procedure: INTRAMEDULLARY (IM) RETROGRADE FEMORAL NAILING;  Surgeon: Eldred Manges, MD;  Location: [facility];  Service: Orthopedics;  Laterality: Left;   tubes in ears      Social History   Socioeconomic History   Marital status: Single    Spouse name: Not on file   Number of children: Not on file   Years of education: 12   Highest education level: Not on file  Occupational History   Not on file  Tobacco Use   Smoking status: Never   Smokeless tobacco: Never  Vaping Use   Vaping Use: Never used  Substance and Sexual Activity   Alcohol use: No   Drug use: No   Sexual activity: Never    Birth control/protection: None  Other Topics Concern   Not on file  Social History Narrative   Patient lives at home with her parents [place] and 27.   Patient has a high school certificate.   Right handed.   Caffeine coke cola sometimes.            Social Determinants of Health   Financial Resource Strain: Not on file  Food Insecurity: Not on file  Transportation Needs: Not on file  Physical Activity: Not on file  Stress: Not on file  Social Connections: Not on file  Intimate Partner Violence: Not on file    Family History  Problem Relation Age of Onset   Thyroid disease Mother    Kidney Stones Father    Heart attack Paternal Grandmother    Thyroid disease Maternal Grandmother    Other Maternal Grandmother        heart issue   Heart attack Maternal Grandfather    Diabetes Maternal Grandfather    Colon cancer Maternal Grandfather    Cancer Maternal Grandfather         prostate       Objective: Vitals:   08/21/21 1556  BP: 103/65  Pulse: (!) 112     Physical Exam  Lab Results:  Results for orders placed or performed in visit on 08/21/21 (from the past 24 hour(s))  Urinalysis, Routine w reflex microscopic     Status: Abnormal   Collection Time: 08/21/21  3:54 PM  Result Value Ref Range   Specific Gravity, UA 1.010 1.005 - 1.030   pH, UA 5.5 5.0 - 7.5   Color, UA Yellow  Yellow   Appearance Ur Clear Clear   Leukocytes,UA Trace (A) Negative   Protein,UA Negative Negative/Trace   Glucose, UA Negative Negative   Ketones, UA Negative Negative   RBC, UA Trace (A) Negative   Bilirubin, UA Negative Negative   Urobilinogen, Ur 0.2 0.2 - 1.0 mg/dL   Nitrite, UA Negative Negative   Microscopic Examination See below:    Narrative   Performed at:  [address] - [facility] [address], [place], [place]  [id number] Lab Director: Chinita Pester MT, Phone:  [phone]  Microscopic Examination     Status: Abnormal   Collection Time: 08/21/21  3:54 PM   Urine  Result Value Ref Range   WBC, UA 6-10 (A) 0 - 5 /hpf   RBC 0-2 0 - 2 /hpf   Epithelial Cells (non renal) 0-10 0 - 10 /hpf   Renal Epithel, UA None seen None seen /hpf   Bacteria, UA Moderate (A) None seen/Few   Narrative   Performed at:  [address] - [facility] [address] [place], [place]  [id number] Lab Director: Chinita Pester MT, Phone:  [phone]    BMET No results for input(s): NA, K, CL, CO2, GLUCOSE, BUN, CREATININE, CALCIUM in the last 72 hours. PSA No results found for: PSA No results found for: TESTOSTERONE    Studies/Results: DG Abd 1 View  Result Date: 08/18/2021 CLINICAL DATA:  Recurrent nephrolithiasis. EXAM: ABDOMEN - 1 VIEW COMPARISON:  CT renal stone protocol 08/28/2020 FINDINGS: Known right renal calculi are not well visualized on the current study. Interval decrease of right ureteral stone burden with 6 mm calculus still remaining in the mid right  ureter. Multiple calcifications overlying the bladder and ureter vesicular junctions appear to be located within the gluteal soft tissues. 3 mm calculus seen in the region in the lower pole the left kidney. No dilated loops of bowel to indicate ileus or obstruction. 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 IMPRESSION: 1. Interval decrease of right ureteral calculus burden with 6 mm calculus still remaining in the mid ureter. 2. Nonobstructing 3 mm left renal calculus. Electronically Signed   By: Acquanetta Belling M.D.   On: 08/18/2021 10:02   US RENAL  Result Date: 08/18/2021 CLINICAL DATA:  Recurrent nephrolithiasis Status post stent placement EXAM: RENAL / URINARY TRACT ULTRASOUND COMPLETE COMPARISON:  Renal stone protocol CT 08/28/2020 FINDINGS: Right Kidney: Renal measurements: 14.5 x 5.7 x 6.4 cm = volume: 274 mL. Echogenicity within normal limits. No mass or hydronephrosis visualized. Multiple echogenic structures in the lower pole the right kidney most likely due to nonobstructing calculi. The largest measures 18 mm. Left Kidney: Renal measurements: 12.6 x 5.8 x 5.8 cm = volume: 222 mL. Echogenicity within normal limits. No mass or hydronephrosis visualized. Bladder: Evaluation limited by underdistention. Other: None. IMPRESSION: 1. Multiple nonobstructing right renal calculi measuring up to 18 mm. 2. No hydronephrosis. Electronically Signed   By: Acquanetta Belling M.D.   On: 08/18/2021 09:57       UA reviewed.    Assessment & Plan: Right renal stones doing well s/p right URS.   The KUB and RUS show persistent non-obstructing renal stones.   I will have her return in 4 months for a repeat CT as the KUB and renal US are more difficult to interpret with her size.   Pyuria with bacteria.  She has no UTI symptoms and her last culture grew Mx Species.    No orders of the defined types  were placed in this encounter.    Orders Placed This Encounter  Procedures   Microscopic  Examination   CT RENAL STONE STUDY    Standing Status:   Future    Standing Expiration Date:   08/21/2022    Order Specific Question:   Preferred imaging location?    Answer:   [facility]    Order Specific Question:   Radiology Contrast Protocol - do NOT remove file path    Answer:   [email or web address]    Order Specific Question:   Is patient pregnant?    Answer:   No   Urinalysis, Routine w reflex microscopic      Return in about 4 months (around 12/22/2021) for with CT stone study.   CC: Marylynn Pearson, FNP  And Dr. Lionel December.    Bjorn Pippin 08/21/2021 Patient ID: Alexandra Kelley, female   DOB: January 03, 1981, 40 y.o.   MRN: [id number]

## 2021-08-21 NOTE — Progress Notes (Signed)
Urological Symptom Review  Patient is experiencing the following symptoms: Kidney stones   Review of Systems  Gastrointestinal (upper)  : Negative for upper GI symptoms  Gastrointestinal (lower) : Negative for lower GI symptoms  Constitutional : Negative for symptoms  Skin: Itching  Eyes: Negative for eye symptoms  Ear/Nose/Throat : Negative for Ear/Nose/Throat symptoms  Hematologic/Lymphatic: Negative for Hematologic/Lymphatic symptoms  Cardiovascular : Leg swelling  Respiratory : Negative for respiratory symptoms  Endocrine: Negative for endocrine symptoms  Musculoskeletal: Back pain  Neurological: Headaches  Psychologic: Negative for psychiatric symptoms

## 2021-08-26 ENCOUNTER — Ambulatory Visit: Payer: Medicaid Other | Admitting: Nurse Practitioner

## 2021-09-04 ENCOUNTER — Other Ambulatory Visit: Payer: Self-pay

## 2021-09-04 ENCOUNTER — Ambulatory Visit: Payer: Medicaid Other | Admitting: Nurse Practitioner

## 2021-09-04 ENCOUNTER — Encounter: Payer: Self-pay | Admitting: Nurse Practitioner

## 2021-09-04 VITALS — BP 101/55 | HR 102 | Ht <= 58 in | Wt 300.2 lb

## 2021-09-04 DIAGNOSIS — E119 Type 2 diabetes mellitus without complications: Secondary | ICD-10-CM | POA: Diagnosis not present

## 2021-09-04 DIAGNOSIS — Z794 Long term (current) use of insulin: Secondary | ICD-10-CM | POA: Diagnosis not present

## 2021-09-04 DIAGNOSIS — E039 Hypothyroidism, unspecified: Secondary | ICD-10-CM

## 2021-09-04 NOTE — Progress Notes (Signed)
Endocrinology Consult Note       09/04/2021, 3:50 PM   Subjective:    Patient ID: Alexandra Kelley, female    DOB: May 14, 1981.  Alexandra Kelley is being seen in consultation for management of currently uncontrolled symptomatic diabetes and hypothyroidism requested by  Marylynn Pearson, FNP.   Past Medical History:  Diagnosis Date   Arthritis    back   Carpal tunnel syndrome    both wrists   Diabetes mellitus without complication (HCC)    Dr. Lurene Shadow   GERD (gastroesophageal reflux disease)    History of kidney stones    Hyperlipidemia    Hypothyroidism    Kidney stone    Neuropathy    feet and hands   Thyroid disease     Past Surgical History:  Procedure Laterality Date   BACK SURGERY  1990s   CYSTOSCOPY/URETEROSCOPY/HOLMIUM LASER/STENT PLACEMENT Right 06/20/2020   Procedure: CYSTOSCOPY, RIGHT RETROGRADE /STENT PLACEMENT;  Surgeon: Bjorn Pippin, MD;  Location: [facility];  Service: Urology;  Laterality: Right;   CYSTOSCOPY/URETEROSCOPY/HOLMIUM LASER/STENT PLACEMENT Right 07/11/2020   Procedure: CYSTOSCOPY RIGHT URETEROSCOPY/HOLMIUM LASER/STENT EXCHANGE;  Surgeon: Bjorn Pippin, MD;  Location: [facility];  Service: Urology;  Laterality: Right;   FEMUR IM NAIL Left 10/03/2013   Procedure: INTRAMEDULLARY (IM) RETROGRADE FEMORAL NAILING;  Surgeon: Eldred Manges, MD;  Location: [facility];  Service: Orthopedics;  Laterality: Left;   tubes in ears      Social History   Socioeconomic History   Marital status: Single    Spouse name: Not on file   Number of children: Not on file   Years of education: 12   Highest education level: Not on file  Occupational History   Not on file  Tobacco Use   Smoking status: Never   Smokeless tobacco: Never  Vaping Use   Vaping Use: Never used  Substance and Sexual Activity   Alcohol use: No   Drug use: No   Sexual activity: Never    Birth control/protection: None  Other Topics  Concern   Not on file  Social History Narrative   Patient lives at home with her parents [place] and 45.   Patient has a high school certificate.   Right handed.   Caffeine coke cola sometimes.            Social Determinants of Health   Financial Resource Strain: Not on file  Food Insecurity: Not on file  Transportation Needs: Not on file  Physical Activity: Not on file  Stress: Not on file  Social Connections: Not on file    Family History  Problem Relation Age of Onset   Thyroid disease Mother    Hyperlipidemia Mother    Kidney Stones Father    Thyroid disease Maternal Grandmother    Other Maternal Grandmother        heart issue   Heart attack Maternal Grandfather    Diabetes Maternal Grandfather    Colon cancer Maternal Grandfather    Cancer Maternal Grandfather        prostate   Heart attack Paternal Grandmother     Outpatient Encounter Medications as of 09/04/2021  Medication Sig   amitriptyline (ELAVIL) 25  MG tablet Take 25 mg by mouth at bedtime.   BD PEN NEEDLE NANO 2ND GEN 32G X 4 MM MISC 2 (two) times daily. as directed   dicyclomine (BENTYL) 10 MG capsule Take 1 capsule (10 mg total) by mouth 3 (three) times daily as needed for spasms. (Patient taking differently: Take 10 mg by mouth every 8 (eight) hours as needed (stomach spasms.).)   gabapentin (NEURONTIN) 300 MG capsule Take 300 mg by mouth 3 (three) times daily.   glucose blood (ACCU-CHEK AVIVA PLUS) test strip USE TO TEST BLOOD SUGAR TWICE A DAY AS DIRECTED BY DOCTOR 90   glucose blood test strip See admin instructions.   hydrOXYzine (ATARAX/VISTARIL) 25 MG tablet Take 25 mg by mouth 2 (two) times daily as needed for itching or anxiety.   insulin aspart protamine - aspart (NOVOLOG MIX 70/30 FLEXPEN) (70-30) 100 UNIT/ML FlexPen Inject 20-30 Units into the skin 2 (two) times daily with a meal. 30 units with breakfast and 20 units with supper   levothyroxine (SYNTHROID) 175 MCG tablet Take 175 mcg by mouth  daily before breakfast.   nystatin cream (MYCOSTATIN) APPLY TO AFFECTED AREA TWICE A DAY   pantoprazole (PROTONIX) 40 MG tablet TAKE 1 TABLET BY MOUTH DAILY BEFORE BREAKFAST   rosuvastatin (CRESTOR) 40 MG tablet Take 40 mg by mouth daily.   silver sulfADIAZINE (SILVADENE) 1 % cream Apply 1 application topically daily. (Patient taking differently: Apply 1 application topically daily as needed (wound care).)   traMADol (ULTRAM) 50 MG tablet TAKE 1 TABLET BY MOUTH EVERY 12 HOURS AS NEEDED (Patient taking differently: Take 50 mg by mouth 2 (two) times daily.)   No facility-administered encounter medications on file as of 09/04/2021.    ALLERGIES: Allergies  Allergen Reactions   5-Alpha Reductase Inhibitors    Canagliflozin Other (See Comments)    Severe yeast infections   Melatonin Other (See Comments)   Metformin Other (See Comments)   Metformin And Related     GI distress   Onglyza [Saxagliptin]     headaches   Statins Other (See Comments)   Zofran [Ondansetron Hcl] Other (See Comments)    Itchy rash in mouth   Cefaclor Rash    Rash   Penicillins Rash    Has patient had a PCN reaction causing immediate rash, facial/tongue/throat swelling, SOB or lightheadedness with hypotension: No Has patient had a PCN reaction causing severe rash involving mucus membranes or skin necrosis: No Has patient had a PCN reaction that required hospitalization: No Has patient had a PCN reaction occurring within the last 10 years: Yes If all of the above answers are "NO", then may proceed with Cephalosporin use.    Pregabalin Itching and Rash   Sitagliptin Rash    VACCINATION STATUS: There is no immunization history for the selected administration types on file for this patient.  Diabetes She presents for her initial diabetic visit. She has type 2 diabetes mellitus. Onset time: diagnosed at approx age of 83. Her disease course has been stable. There are no hypoglycemic associated symptoms. Pertinent  negatives for hypoglycemia include no nervousness/anxiousness or tremors. There are no diabetic associated symptoms. Pertinent negatives for diabetes include no fatigue. There are no hypoglycemic complications. Symptoms are stable. Diabetic complications include peripheral neuropathy. Risk factors for coronary artery disease include diabetes mellitus and obesity. Current diabetic treatment includes insulin injections. She is compliant with treatment most of the time. Her weight is increasing steadily. She is following a generally unhealthy diet. When asked  about meal planning, she reported none. She has not had a previous visit with a dietitian. She never participates in exercise. (She presents today for her consultation, accompanied by her mother, with no meter or logs to review.  Her most recent a1c was 6.8% on 08/05/21.  She routinely monitors glucose twice daily.  She drinks diet tea, diet soda, and water with SF flavoring.  She also typically eats breakfast (cereal), skips lunch, snacks throughout the day, and eats a late dinner when her mother gets home from work.  She does not engage in routine physical activity.  She is UTD on eye exam and sees podiatrist.  She does not report any symptoms of low glucose.  She does look to her mother to answer some of the questions due to her mental disability.) An ACE inhibitor/angiotensin II receptor blocker is not being taken. She sees a podiatrist.Eye exam is current.  Hyperlipidemia This is a chronic problem. The current episode started more than 1 year ago. Exacerbating diseases include diabetes, hypothyroidism and obesity. Factors aggravating her hyperlipidemia include fatty foods. Current antihyperlipidemic treatment includes statins. Compliance problems include adherence to diet and adherence to exercise.  Risk factors for coronary artery disease include diabetes mellitus, dyslipidemia, family history, obesity and a sedentary lifestyle.  Thyroid Problem Presents  for initial visit. Symptoms include weight gain. Patient reports no anxiety, cold intolerance, constipation, depressed mood, fatigue, hair loss, heat intolerance, leg swelling, palpitations or tremors. Past treatments include levothyroxine. The treatment provided moderate relief. Her past medical history is significant for diabetes, hyperlipidemia and obesity. Risk factors include family history of hypothyroidism.    Review of systems  Constitutional: + Minimally fluctuating body weight, current Body mass index is 67.91 kg/m., no fatigue, no subjective hyperthermia, no subjective hypothermia Eyes: no blurry vision, no xerophthalmia ENT: no sore throat, no nodules palpated in throat, no dysphagia/odynophagia, no hoarseness Cardiovascular: no chest pain, no shortness of breath, no palpitations, no leg swelling Respiratory: no cough, no shortness of breath Gastrointestinal: no nausea/vomiting/diarrhea Musculoskeletal: no muscle/joint aches Skin: no rashes, no hyperemia Neurological: no tremors, no numbness, no tingling, no dizziness Psychiatric: no depression, no anxiety  Objective:     BP (!) 101/55   Pulse (!) 102   Ht 4' 7.75" (1.416 m)   Wt (!) 300 lb 3.2 oz (136.2 kg)   BMI 67.91 kg/m   Wt Readings from Last 3 Encounters:  09/04/21 (!) 300 lb 3.2 oz (136.2 kg)  02/20/21 298 lb (135.2 kg)  11/21/20 277 lb (125.6 kg)     BP Readings from Last 3 Encounters:  09/04/21 (!) 101/55  08/21/21 103/65  02/20/21 96/63     Physical Exam- Limited  Constitutional:  Body mass index is 67.91 kg/m. , not in acute distress, normal state of mind Eyes:  EOMI, no exophthalmos Neck: Supple Cardiovascular: tachycardic, no murmurs, rubs, or gallops, no edema Respiratory: Adequate breathing efforts, no crackles, rales, rhonchi, or wheezing Musculoskeletal: no gross deformities, strength intact in all four extremities, no gross restriction of joint movements Skin:  no rashes, no  hyperemia Neurological: no tremor with outstretched hands    CMP ( most recent) CMP     Component Value Date/Time   NA 138 07/03/2020 1438   K 4.4 07/03/2020 1438   CL 97 (L) 07/03/2020 1438   CO2 29 07/03/2020 1438   GLUCOSE 170 (H) 07/03/2020 1438   BUN 9 07/03/2020 1438   CREATININE 0.72 07/03/2020 1438   CALCIUM 9.6 07/03/2020 1438  PROT 6.8 10/18/2018 0458   ALBUMIN 3.2 (L) 10/18/2018 0458   AST 18 10/18/2018 0458   ALT 19 10/18/2018 0458   ALKPHOS 56 10/18/2018 0458   BILITOT 0.9 10/18/2018 0458   GFRNONAA >60 07/03/2020 1438   GFRAA >60 07/03/2020 1438     Diabetic Labs (most recent): Lab Results  Component Value Date   HGBA1C 8.7 (H) 06/12/2020   HGBA1C 7.6 (H) 10/17/2018   HGBA1C 7.6 (H) 10/05/2013     Lipid Panel ( most recent) Lipid Panel  No results found for: CHOL, TRIG, HDL, CHOLHDL, VLDL, LDLCALC, LDLDIRECT, LABVLDL    Lab Results  Component Value Date   TSH 1.812 10/17/2018   TSH 5.154 (H) 10/03/2013           Assessment & Plan:   1) Uncontrolled type 2 diabetes with long term insulin therapy  She presents today for her consultation, accompanied by her mother, with no meter or logs to review.  Her most recent a1c was 6.8% on 08/05/21.  She routinely monitors glucose twice daily.  She drinks diet tea, diet soda, and water with SF flavoring.  She also typically eats breakfast (cereal), skips lunch, snacks throughout the day, and eats a late dinner when her mother gets home from work.  She does not engage in routine physical activity.  She is UTD on eye exam and sees podiatrist.  She does not report any symptoms of low glucose.  She does look to her mother to answer some of the questions due to her mental disability.  - Alexandra Kelley has currently uncontrolled symptomatic type 2 DM since 40 years of age, with most recent A1c of 6.8 %.   -Recent labs reviewed.  - I had a long discussion with her about the progressive nature of diabetes and  the pathology behind its complications. -her diabetes is complicated by peripheral neuropathy and she remains at a high risk for more acute and chronic complications which include CAD, CVA, CKD, retinopathy, and neuropathy. These are all discussed in detail with her.  - I have counseled her on diet and weight management by adopting a carbohydrate restricted/protein rich diet. Patient is encouraged to switch to unprocessed or minimally processed complex starch and increased protein intake (animal or plant source), fruits, and vegetables. -  she is advised to stick to a routine mealtimes to eat 3 meals a day and avoid unnecessary snacks (to snack only to correct hypoglycemia).   - she acknowledges that there is a room for improvement in her food and drink choices. - Suggestion is made for her to avoid simple carbohydrates from her diet including Cakes, Sweet Desserts, Ice Cream, Soda (diet and regular), Sweet Tea, Candies, Chips, Cookies, Store Bought Juices, Alcohol in Excess of 1-2 drinks a day, Artificial Sweeteners, Coffee Creamer, and "Sugar-free" Products. This will help patient to have more stable blood glucose profile and potentially avoid unintended weight gain.  - she will be scheduled with Norm Salt, RDN, CDE for diabetes education.  - I have approached her with the following individualized plan to manage her diabetes and patient agrees:   -She is advised to lower her premixed insulin 70/30 to 30 units with breakfast and 20 units with supper if glucose is above 90 and she is eating.  -she is encouraged to start monitoring glucose 4 times daily, before meals and before bed, to log their readings on the clinic sheets provided, and bring them to review at follow up appointment in 2  weeks.  - she is warned not to take insulin without proper monitoring per orders. - Adjustment parameters are given to her for hypo and hyperglycemia in writing. - she is encouraged to call clinic for blood  glucose levels less than 70 or above 300 mg /dl.  - she will be considered for incretin therapy as appropriate next visit.  - Specific targets for  A1c; LDL, HDL, and Triglycerides were discussed with the patient.  2) Blood Pressure /Hypertension:  her blood pressure is controlled to target without the use of antihypertensives.    3) Lipids/Hyperlipidemia:    There is no recent lipid panel to review. she is advised to continue Crestor 40 mg daily at bedtime.  Side effects and precautions discussed with her.  4)  Weight/Diet:  her Body mass index is 67.91 kg/m.  -  clearly complicating her diabetes care.   she is a candidate for weight loss. I discussed with her the fact that loss of 5 - 10% of her  current body weight will have the most impact on her diabetes management.  Exercise, and detailed carbohydrates information provided  -  detailed on discharge instructions.  5) Hypothyroidism-unspecified Her most recent TFTs are consistent with over-replacement.  She was also noted to be tachycardic today.  I will recheck TFTs prior to next visit and adjust dose if necessary.  Until then, she is advised to continue Levothyroxine 175 mcg po daily before breakfast.   - The correct intake of thyroid hormone (Levothyroxine, Synthroid), is on empty stomach first thing in the morning, with water, separated by at least 30 minutes from breakfast and other medications,  and separated by more than 4 hours from calcium, iron, multivitamins, acid reflux medications (PPIs).  - This medication is a life-long medication and will be needed to correct thyroid hormone imbalances for the rest of your life.  The dose may change from time to time, based on thyroid blood work.  - It is extremely important to be consistent taking this medication, near the same time each morning.  -AVOID TAKING PRODUCTS CONTAINING BIOTIN (commonly found in Hair, Skin, Nails vitamins) AS IT INTERFERES WITH THE VALIDITY OF THYROID FUNCTION  BLOOD TESTS.  6) Chronic Care/Health Maintenance: -she is not on ACEI/ARB and is on Statin medications and is encouraged to initiate and continue to follow up with Ophthalmology, Dentist, Podiatrist at least yearly or according to recommendations, and advised to stay away from smoking. I have recommended yearly flu vaccine and pneumonia vaccine at least every 5 years; moderate intensity exercise for up to 150 minutes weekly; and sleep for at least 7 hours a day.  - she is advised to maintain close follow up with Marylynn Pearson, FNP for primary care needs, as well as her other providers for optimal and coordinated care.   - Time spent in this patient care: 60 min, of which > 50% was spent in counseling her about her diabetes and the rest reviewing her blood glucose logs, discussing her hypoglycemia and hyperglycemia episodes, reviewing her current and previous labs/studies (including abstraction from other facilities) and medications doses and developing a long term treatment plan based on the latest standards of care/guidelines; and documenting her care.    Please refer to Patient Instructions for Blood Glucose Monitoring and Insulin/Medications Dosing Guide" in media tab for additional information. Please also refer to "Patient Self Inventory" in the Media tab for reviewed elements of pertinent patient history.  Alexandra Kelley participated in the discussions, expressed  understanding, and voiced agreement with the above plans.  All questions were answered to her satisfaction. she is encouraged to contact clinic should she have any questions or concerns prior to her return visit.     Follow up plan: - Return in about 2 weeks (around 09/18/2021) for Diabetes F/U, Bring meter and logs.    Ronny Bacon, [facility] [address] [place], [place] [address] Phone: [phone] Fax: [phone]  09/04/2021, 3:50 PM

## 2021-09-04 NOTE — Patient Instructions (Signed)
Diabetes Mellitus and Nutrition, Adult When you have diabetes, or diabetes mellitus, it is very important to have healthy eating habits because your blood sugar (glucose) levels are greatly affected by what you eat and drink. Eating healthy foods in the right amounts, at about the same times every day, can help you:  Control your blood glucose.  Lower your risk of heart disease.  Improve your blood pressure.  Reach or maintain a healthy weight. What can affect my meal plan? Every person with diabetes is different, and each person has different needs for a meal plan. Your health care provider may recommend that you work with a dietitian to make a meal plan that is best for you. Your meal plan may vary depending on factors such as:  The calories you need.  The medicines you take.  Your weight.  Your blood glucose, blood pressure, and cholesterol levels.  Your activity level.  Other health conditions you have, such as heart or kidney disease. How do carbohydrates affect me? Carbohydrates, also called carbs, affect your blood glucose level more than any other type of food. Eating carbs naturally raises the amount of glucose in your blood. Carb counting is a method for keeping track of how many carbs you eat. Counting carbs is important to keep your blood glucose at a healthy level, especially if you use insulin or take certain oral diabetes medicines. It is important to know how many carbs you can safely have in each meal. This is different for every person. Your dietitian can help you calculate how many carbs you should have at each meal and for each snack. How does alcohol affect me? Alcohol can cause a sudden decrease in blood glucose (hypoglycemia), especially if you use insulin or take certain oral diabetes medicines. Hypoglycemia can be a life-threatening condition. Symptoms of hypoglycemia, such as sleepiness, dizziness, and confusion, are similar to symptoms of having too much  alcohol.  Do not drink alcohol if: ? Your health care provider tells you not to drink. ? You are pregnant, may be pregnant, or are planning to become pregnant.  If you drink alcohol: ? Do not drink on an empty stomach. ? Limit how much you use to:  0-1 drink a day for women.  0-2 drinks a day for men. ? Be aware of how much alcohol is in your drink. In the [place] one drink equals one 12 oz bottle of beer (355 mL), one 5 oz glass of wine (148 mL), or one 1 oz glass of hard liquor (44 mL). ? Keep yourself hydrated with water, diet soda, or unsweetened iced tea.  Keep in mind that regular soda, juice, and other mixers may contain a lot of sugar and must be counted as carbs. What are tips for following this plan? Reading food labels  Start by checking the serving size on the "Nutrition Facts" label of packaged foods and drinks. The amount of calories, carbs, fats, and other nutrients listed on the label is based on one serving of the item. Many items contain more than one serving per package.  Check the total grams (g) of carbs in one serving. You can calculate the number of servings of carbs in one serving by dividing the total carbs by 15. For example, if a food has 30 g of total carbs per serving, it would be equal to 2 servings of carbs.  Check the number of grams (g) of saturated fats and trans fats in one serving. Choose foods that have   a low amount or none of these fats.  Check the number of milligrams (mg) of salt (sodium) in one serving. Most people should limit total sodium intake to less than 2,300 mg per day.  Always check the nutrition information of foods labeled as "low-fat" or "nonfat." These foods may be higher in added sugar or refined carbs and should be avoided.  Talk to your dietitian to identify your daily goals for nutrients listed on the label. Shopping  Avoid buying canned, pre-made, or processed foods. These foods tend to be high in fat, sodium, and added  sugar.  Shop around the outside edge of the grocery store. This is where you will most often find fresh fruits and vegetables, bulk grains, fresh meats, and fresh dairy. Cooking  Use low-heat cooking methods, such as baking, instead of high-heat cooking methods like deep frying.  Cook using healthy oils, such as olive, canola, or sunflower oil.  Avoid cooking with butter, cream, or high-fat meats. Meal planning  Eat meals and snacks regularly, preferably at the same times every day. Avoid going long periods of time without eating.  Eat foods that are high in fiber, such as fresh fruits, vegetables, beans, and whole grains. Talk with your dietitian about how many servings of carbs you can eat at each meal.  Eat 4-6 oz (112-168 g) of lean protein each day, such as lean meat, chicken, fish, eggs, or tofu. One ounce (oz) of lean protein is equal to: ? 1 oz (28 g) of meat, chicken, or fish. ? 1 egg. ?  cup (62 g) of tofu.  Eat some foods each day that contain healthy fats, such as avocado, nuts, seeds, and fish.   What foods should I eat? Fruits Berries. Apples. Oranges. Peaches. Apricots. Plums. Grapes. Mango. Papaya. Pomegranate. Kiwi. Cherries. Vegetables Lettuce. Spinach. Leafy greens, including kale, chard, collard greens, and mustard greens. Beets. Cauliflower. Cabbage. Broccoli. Carrots. Green beans. Tomatoes. Peppers. Onions. Cucumbers. Brussels sprouts. Grains Whole grains, such as whole-wheat or whole-grain bread, crackers, tortillas, cereal, and pasta. Unsweetened oatmeal. Quinoa. Brown or wild rice. Meats and other proteins Seafood. Poultry without skin. Lean cuts of poultry and beef. Tofu. Nuts. Seeds. Dairy Low-fat or fat-free dairy products such as milk, yogurt, and cheese. The items listed above may not be a complete list of foods and beverages you can eat. Contact a dietitian for more information. What foods should I avoid? Fruits Fruits canned with  syrup. Vegetables Canned vegetables. Frozen vegetables with butter or cream sauce. Grains Refined white flour and flour products such as bread, pasta, snack foods, and cereals. Avoid all processed foods. Meats and other proteins Fatty cuts of meat. Poultry with skin. Breaded or fried meats. Processed meat. Avoid saturated fats. Dairy Full-fat yogurt, cheese, or milk. Beverages Sweetened drinks, such as soda or iced tea. The items listed above may not be a complete list of foods and beverages you should avoid. Contact a dietitian for more information. Questions to ask a health care provider  Do I need to meet with a diabetes educator?  Do I need to meet with a dietitian?  What number can I call if I have questions?  When are the best times to check my blood glucose? Where to find more information:  American Diabetes Association: diabetes.org  Academy of Nutrition and Dietetics: www.eatright.org  National Institute of Diabetes and Digestive and Kidney Diseases: www.niddk.nih.gov  Association of Diabetes Care and Education Specialists: www.diabeteseducator.org Summary  It is important to have healthy eating   habits because your blood sugar (glucose) levels are greatly affected by what you eat and drink.  A healthy meal plan will help you control your blood glucose and maintain a healthy lifestyle.  Your health care provider may recommend that you work with a dietitian to make a meal plan that is best for you.  Keep in mind that carbohydrates (carbs) and alcohol have immediate effects on your blood glucose levels. It is important to count carbs and to use alcohol carefully. This information is not intended to replace advice given to you by your health care provider. Make sure you discuss any questions you have with your health care provider. Document Revised: 09/26/2019 Document Reviewed: 09/26/2019 [employer] Patient Education  2021 [employer]  

## 2021-09-05 LAB — T4, FREE: Free T4: 1.68 ng/dL (ref 0.82–1.77)

## 2021-09-05 LAB — TSH: TSH: 0.033 u[IU]/mL — ABNORMAL LOW (ref 0.450–4.500)

## 2021-09-18 ENCOUNTER — Encounter: Payer: Self-pay | Admitting: Nurse Practitioner

## 2021-09-18 ENCOUNTER — Ambulatory Visit: Payer: Medicaid Other | Admitting: Nurse Practitioner

## 2021-09-18 VITALS — BP 110/58 | HR 107 | Ht <= 58 in | Wt 295.0 lb

## 2021-09-18 DIAGNOSIS — E119 Type 2 diabetes mellitus without complications: Secondary | ICD-10-CM

## 2021-09-18 DIAGNOSIS — Z794 Long term (current) use of insulin: Secondary | ICD-10-CM

## 2021-09-18 DIAGNOSIS — E039 Hypothyroidism, unspecified: Secondary | ICD-10-CM | POA: Diagnosis not present

## 2021-09-18 MED ORDER — BD PEN NEEDLE NANO 2ND GEN 32G X 4 MM MISC: 3 refills | Status: DC

## 2021-09-18 MED ORDER — LEVOTHYROXINE SODIUM 150 MCG PO TABS: 150.0000 ug | ORAL_TABLET | Freq: Every day | ORAL | 1 refills | Status: DC

## 2021-09-18 NOTE — Patient Instructions (Signed)

## 2021-09-18 NOTE — Progress Notes (Signed)
Endocrinology Follow Up Note       09/18/2021, 3:32 PM   Subjective:    Patient ID: Alexandra Kelley, female    DOB: 06-21-1981.  Alexandra Kelley is being seen in follow up after being seen in consultation for management of currently uncontrolled symptomatic diabetes and hypothyroidism requested by  Marylynn Pearson, FNP.   Past Medical History:  Diagnosis Date   Arthritis    back   Carpal tunnel syndrome    both wrists   Diabetes mellitus without complication (HCC)    Dr. Lurene Shadow   GERD (gastroesophageal reflux disease)    History of kidney stones    Hyperlipidemia    Hypothyroidism    Kidney stone    Neuropathy    feet and hands   Thyroid disease     Past Surgical History:  Procedure Laterality Date   BACK SURGERY  1990s   CYSTOSCOPY/URETEROSCOPY/HOLMIUM LASER/STENT PLACEMENT Right 06/20/2020   Procedure: CYSTOSCOPY, RIGHT RETROGRADE /STENT PLACEMENT;  Surgeon: Bjorn Pippin, MD;  Location: [facility];  Service: Urology;  Laterality: Right;   CYSTOSCOPY/URETEROSCOPY/HOLMIUM LASER/STENT PLACEMENT Right 07/11/2020   Procedure: CYSTOSCOPY RIGHT URETEROSCOPY/HOLMIUM LASER/STENT EXCHANGE;  Surgeon: Bjorn Pippin, MD;  Location: [facility];  Service: Urology;  Laterality: Right;   FEMUR IM NAIL Left 10/03/2013   Procedure: INTRAMEDULLARY (IM) RETROGRADE FEMORAL NAILING;  Surgeon: Eldred Manges, MD;  Location: [facility];  Service: Orthopedics;  Laterality: Left;   tubes in ears      Social History   Socioeconomic History   Marital status: Single    Spouse name: Not on file   Number of children: Not on file   Years of education: 12   Highest education level: Not on file  Occupational History   Not on file  Tobacco Use   Smoking status: Never   Smokeless tobacco: Never  Vaping Use   Vaping Use: Never used  Substance and Sexual Activity   Alcohol use: No   Drug use: No   Sexual activity: Never    Birth  control/protection: None  Other Topics Concern   Not on file  Social History Narrative   Patient lives at home with her parents [place] and 64.   Patient has a high school certificate.   Right handed.   Caffeine coke cola sometimes.            Social Determinants of Health   Financial Resource Strain: Not on file  Food Insecurity: Not on file  Transportation Needs: Not on file  Physical Activity: Not on file  Stress: Not on file  Social Connections: Not on file    Family History  Problem Relation Age of Onset   Thyroid disease Mother    Hyperlipidemia Mother    Kidney Stones Father    Thyroid disease Maternal Grandmother    Other Maternal Grandmother        heart issue   Heart attack Maternal Grandfather    Diabetes Maternal Grandfather    Colon cancer Maternal Grandfather    Cancer Maternal Grandfather        prostate   Heart attack Paternal Grandmother     Outpatient Encounter Medications as of 09/18/2021  Medication Sig   amitriptyline (ELAVIL) 25 MG tablet Take 25 mg by mouth at bedtime.   BD PEN NEEDLE NANO 2ND GEN 32G X 4 MM MISC 2 (two) times daily. as directed   dicyclomine (BENTYL) 10 MG capsule Take 1 capsule (10 mg total) by mouth 3 (three) times daily as needed for spasms. (Patient taking differently: Take 10 mg by mouth every 8 (eight) hours as needed (stomach spasms.).)   gabapentin (NEURONTIN) 300 MG capsule Take 300 mg by mouth 3 (three) times daily.   glucose blood (ACCU-CHEK AVIVA PLUS) test strip USE TO TEST BLOOD SUGAR TWICE A DAY AS DIRECTED BY DOCTOR 90   glucose blood test strip See admin instructions.   hydrOXYzine (ATARAX/VISTARIL) 25 MG tablet Take 25 mg by mouth 2 (two) times daily as needed for itching or anxiety.   insulin aspart protamine - aspart (NOVOLOG MIX 70/30 FLEXPEN) (70-30) 100 UNIT/ML FlexPen Inject 30 Units into the skin 2 (two) times daily with a meal. 30 units with breakfast and 30 units with supper   levothyroxine  (SYNTHROID) 150 MCG tablet Take 1 tablet (150 mcg total) by mouth daily before breakfast.   nystatin cream (MYCOSTATIN) APPLY TO AFFECTED AREA TWICE A DAY   pantoprazole (PROTONIX) 40 MG tablet TAKE 1 TABLET BY MOUTH DAILY BEFORE BREAKFAST   rosuvastatin (CRESTOR) 40 MG tablet Take 40 mg by mouth daily.   silver sulfADIAZINE (SILVADENE) 1 % cream Apply 1 application topically daily. (Patient taking differently: Apply 1 application topically daily as needed (wound care).)   traMADol (ULTRAM) 50 MG tablet TAKE 1 TABLET BY MOUTH EVERY 12 HOURS AS NEEDED (Patient taking differently: Take 50 mg by mouth 2 (two) times daily.)   [DISCONTINUED] levothyroxine (SYNTHROID) 175 MCG tablet Take 175 mcg by mouth daily before breakfast.   No facility-administered encounter medications on file as of 09/18/2021.    ALLERGIES: Allergies  Allergen Reactions   5-Alpha Reductase Inhibitors    Canagliflozin Other (See Comments)    Severe yeast infections   Melatonin Other (See Comments)   Metformin Other (See Comments)   Metformin And Related     GI distress   Onglyza [Saxagliptin]     headaches   Statins Other (See Comments)   Zofran [Ondansetron Hcl] Other (See Comments)    Itchy rash in mouth   Cefaclor Rash    Rash   Penicillins Rash    Has patient had a PCN reaction causing immediate rash, facial/tongue/throat swelling, SOB or lightheadedness with hypotension: No Has patient had a PCN reaction causing severe rash involving mucus membranes or skin necrosis: No Has patient had a PCN reaction that required hospitalization: No Has patient had a PCN reaction occurring within the last 10 years: Yes If all of the above answers are "NO", then may proceed with Cephalosporin use.    Pregabalin Itching and Rash   Sitagliptin Rash    VACCINATION STATUS: There is no immunization history for the selected administration types on file for this patient.  Diabetes She presents for her follow-up diabetic  visit. She has type 2 diabetes mellitus. Onset time: diagnosed at approx age of 40. Her disease course has been stable. There are no hypoglycemic associated symptoms. Pertinent negatives for hypoglycemia include no nervousness/anxiousness or tremors. There are no diabetic associated symptoms. Pertinent negatives for diabetes include no fatigue. There are no hypoglycemic complications. Symptoms are stable. Diabetic complications include peripheral neuropathy. Risk factors for coronary artery disease include diabetes mellitus and obesity. Current diabetic treatment  includes insulin injections. She is compliant with treatment most of the time. Her weight is decreasing steadily. She is following a generally healthy diet. When asked about meal planning, she reported none. She has not had a previous visit with a dietitian. She never participates in exercise. Her home blood glucose trend is fluctuating minimally. Her breakfast blood glucose range is generally 140-180 mg/dl. Her lunch blood glucose range is generally 140-180 mg/dl. Her dinner blood glucose range is generally >200 mg/dl. Her bedtime blood glucose range is generally >200 mg/dl. (She presents today, accompanied by her mother, with her logs and meter showing stable glycemic profile with slightly above target fasting readings.  She was not due for another A1c today.  Analysis of her meter shows 7-day average of 178, 14-day average of 170, and 30-day average of 155.  She did celebrate her birthday since last visit and did have ice cream which cause higher readings than usual.) An ACE inhibitor/angiotensin II receptor blocker is not being taken. She sees a podiatrist.Eye exam is current.  Hyperlipidemia This is a chronic problem. The current episode started more than 1 year ago. Exacerbating diseases include diabetes, hypothyroidism and obesity. Factors aggravating her hyperlipidemia include fatty foods. Current antihyperlipidemic treatment includes statins.  Compliance problems include adherence to diet and adherence to exercise.  Risk factors for coronary artery disease include diabetes mellitus, dyslipidemia, family history, obesity and a sedentary lifestyle.  Thyroid Problem Presents for initial visit. Symptoms include weight gain. Patient reports no anxiety, cold intolerance, constipation, depressed mood, fatigue, hair loss, heat intolerance, leg swelling, palpitations or tremors. Past treatments include levothyroxine. The treatment provided moderate relief. Her past medical history is significant for diabetes, hyperlipidemia and obesity. Risk factors include family history of hypothyroidism.    Review of systems  Constitutional: + steadily decreasing body weight,  current Body mass index is 66.73 kg/m. , no fatigue, no subjective hyperthermia, no subjective hypothermia Eyes: no blurry vision, no xerophthalmia ENT: no sore throat, no nodules palpated in throat, no dysphagia/odynophagia, no hoarseness Cardiovascular: no chest pain, no shortness of breath, no palpitations, no leg swelling Respiratory: no cough, no shortness of breath Gastrointestinal: no nausea/vomiting/diarrhea Musculoskeletal: no muscle/joint aches Skin: no rashes, no hyperemia Neurological: no tremors, no numbness, no tingling, no dizziness Psychiatric: no depression, no anxiety  Objective:     BP (!) 110/58   Pulse (!) 107   Ht 4' 7.75" (1.416 m)   Wt 295 lb (133.8 kg)   BMI 66.73 kg/m   Wt Readings from Last 3 Encounters:  09/18/21 295 lb (133.8 kg)  09/04/21 (!) 300 lb 3.2 oz (136.2 kg)  02/20/21 298 lb (135.2 kg)     BP Readings from Last 3 Encounters:  09/18/21 (!) 110/58  09/04/21 (!) 101/55  08/21/21 103/65      Physical Exam- Limited  Constitutional:  Body mass index is 66.73 kg/m. , not in acute distress, normal state of mind Eyes:  EOMI, no exophthalmos Neck: Supple Cardiovascular: tachycardic, no murmurs, rubs, or gallops, no  edema Respiratory: Adequate breathing efforts, no crackles, rales, rhonchi, or wheezing Musculoskeletal: no gross deformities, strength intact in all four extremities, no gross restriction of joint movements Skin:  no rashes, no hyperemia Neurological: no tremor with outstretched hands    Diabetic Foot Exam - Simple   Simple Foot Form Diabetic Foot exam was performed with the following findings: Yes 09/18/2021  3:28 PM  Visual Inspection See comments: Yes Sensation Testing Intact to touch and monofilament testing bilaterally:  Yes Pulse Check Posterior Tibialis and Dorsalis pulse intact bilaterally: Yes Comments Dry scaly skin bilaterally, toenails in need of trim     CMP ( most recent) CMP     Component Value Date/Time   NA 138 07/03/2020 1438   K 4.4 07/03/2020 1438   CL 97 (L) 07/03/2020 1438   CO2 29 07/03/2020 1438   GLUCOSE 170 (H) 07/03/2020 1438   BUN 9 07/03/2020 1438   CREATININE 0.72 07/03/2020 1438   CALCIUM 9.6 07/03/2020 1438   PROT 6.8 10/18/2018 0458   ALBUMIN 3.2 (L) 10/18/2018 0458   AST 18 10/18/2018 0458   ALT 19 10/18/2018 0458   ALKPHOS 56 10/18/2018 0458   BILITOT 0.9 10/18/2018 0458   GFRNONAA >60 07/03/2020 1438   GFRAA >60 07/03/2020 1438     Diabetic Labs (most recent): Lab Results  Component Value Date   HGBA1C 6.8 08/05/2021   HGBA1C 8.7 (H) 06/12/2020   HGBA1C 7.6 (H) 10/17/2018     Lipid Panel ( most recent) Lipid Panel  No results found for: CHOL, TRIG, HDL, CHOLHDL, VLDL, LDLCALC, LDLDIRECT, LABVLDL    Lab Results  Component Value Date   TSH 0.033 (L) 09/04/2021   TSH 1.812 10/17/2018   TSH 5.154 (H) 10/03/2013   FREET4 1.68 09/04/2021           Assessment & Plan:   1) Uncontrolled type 2 diabetes with long term insulin therapy  She presents today, accompanied by her mother, with her logs and meter showing stable glycemic profile with slightly above target fasting readings.  She was not due for another A1c  today.  Analysis of her meter shows 7-day average of 178, 14-day average of 170, and 30-day average of 155.  She did celebrate her birthday since last visit and did have ice cream which cause higher readings than usual.  - Alexandra Kelley has currently uncontrolled symptomatic type 2 DM since 40 years of age, with most recent A1c of 6.8 %.   -Recent labs reviewed.  - I had a long discussion with her about the progressive nature of diabetes and the pathology behind its complications. -her diabetes is complicated by peripheral neuropathy and she remains at a high risk for more acute and chronic complications which include CAD, CVA, CKD, retinopathy, and neuropathy. These are all discussed in detail with her.  - Nutritional counseling repeated at each appointment due to patients tendency to fall back in to old habits.  - The patient admits there is a room for improvement in their diet and drink choices. -  Suggestion is made for the patient to avoid simple carbohydrates from their diet including Cakes, Sweet Desserts / Pastries, Ice Cream, Soda (diet and regular), Sweet Tea, Candies, Chips, Cookies, Sweet Pastries, Store Bought Juices, Alcohol in Excess of 1-2 drinks a day, Artificial Sweeteners, Coffee Creamer, and "Sugar-free" Products. This will help patient to have stable blood glucose profile and potentially avoid unintended weight gain.   - I encouraged the patient to switch to unprocessed or minimally processed complex starch and increased protein intake (animal or plant source), fruits, and vegetables.   - Patient is advised to stick to a routine mealtimes to eat 3 meals a day and avoid unnecessary snacks (to snack only to correct hypoglycemia).  - she will be scheduled with Norm Salt, RDN, CDE for diabetes education- has an appt with her in December.  - I have approached her with the following individualized plan to manage her diabetes  and patient agrees:   -She is advised to  adjust her premixed insulin 70/30 to 30 units with breakfast and 30 units with supper if glucose is above 90 and she is eating.    -she is encouraged to continue monitoring blood glucose 3 times daily, before injecting insulin and before bed, and to call the clinic if she has readings less than 70 or above 200 for 3 tests in a row.  - she is warned not to take insulin without proper monitoring per orders. - Adjustment parameters are given to her for hypo and hyperglycemia in writing.  - she will be considered for incretin therapy as appropriate next visit.  - Specific targets for  A1c; LDL, HDL, and Triglycerides were discussed with the patient.  2) Blood Pressure /Hypertension:  her blood pressure is controlled to target without the use of antihypertensives.    3) Lipids/Hyperlipidemia:    There is no recent lipid panel to review. she is advised to continue Crestor 40 mg daily at bedtime.  Side effects and precautions discussed with her.  Will check lipid panel on subsequent visits.  4)  Weight/Diet:  her Body mass index is 66.73 kg/m.  -  clearly complicating her diabetes care.   she is a candidate for weight loss. I discussed with her the fact that loss of 5 - 10% of her  current body weight will have the most impact on her diabetes management.  Exercise, and detailed carbohydrates information provided  -  detailed on discharge instructions.  5) Hypothyroidism-unspecified Her repeat thyroid function tests are still consistent with over-replacement and she does have tachycardia.  I discussed and decreased her Levothyroxine to 150 mcg po daily before breakfast.  Will recheck TFTs prior to next visit and adjust dose further if needed.   - The correct intake of thyroid hormone (Levothyroxine, Synthroid), is on empty stomach first thing in the morning, with water, separated by at least 30 minutes from breakfast and other medications,  and separated by more than 4 hours from calcium, iron,  multivitamins, acid reflux medications (PPIs).  - This medication is a life-long medication and will be needed to correct thyroid hormone imbalances for the rest of your life.  The dose may change from time to time, based on thyroid blood work.  - It is extremely important to be consistent taking this medication, near the same time each morning.  -AVOID TAKING PRODUCTS CONTAINING BIOTIN (commonly found in Hair, Skin, Nails vitamins) AS IT INTERFERES WITH THE VALIDITY OF THYROID FUNCTION BLOOD TESTS.  6) Chronic Care/Health Maintenance: -she is not on ACEI/ARB and is on Statin medications and is encouraged to initiate and continue to follow up with Ophthalmology, Dentist, Podiatrist at least yearly or according to recommendations, and advised to stay away from smoking. I have recommended yearly flu vaccine and pneumonia vaccine at least every 5 years; moderate intensity exercise for up to 150 minutes weekly; and sleep for at least 7 hours a day.  - she is advised to maintain close follow up with Marylynn Pearson, FNP for primary care needs, as well as her other providers for optimal and coordinated care.     I spent 25 minutes in the care of the patient today including review of labs from CMP, Lipids, Thyroid Function, Hematology (current and previous including abstractions from other facilities); face-to-face time discussing  her blood glucose readings/logs, discussing hypoglycemia and hyperglycemia episodes and symptoms, medications doses, her options of short and long term treatment based  on the latest standards of care / guidelines;  discussion about incorporating lifestyle medicine;  and documenting the encounter.    Please refer to Patient Instructions for Blood Glucose Monitoring and Insulin/Medications Dosing Guide"  in media tab for additional information. Please  also refer to " Patient Self Inventory" in the Media  tab for reviewed elements of pertinent patient history.  Alexandra A  Kelley participated in the discussions, expressed understanding, and voiced agreement with the above plans.  All questions were answered to her satisfaction. she is encouraged to contact clinic should she have any questions or concerns prior to her return visit.     Follow up plan: - Return in about 3 months (around 12/19/2021) for Diabetes F/U with A1c in office, Thyroid follow up, Previsit labs, Bring meter and logs.    Ronny Bacon, [facility] [address] [place], [place] [address] Phone: [phone] Fax: [phone]  09/18/2021, 3:32 PM

## 2021-09-29 ENCOUNTER — Other Ambulatory Visit: Payer: Self-pay | Admitting: Adult Health

## 2021-10-16 ENCOUNTER — Ambulatory Visit: Payer: Medicaid Other | Admitting: Nutrition

## 2021-11-18 ENCOUNTER — Other Ambulatory Visit: Payer: Self-pay

## 2021-11-18 ENCOUNTER — Encounter: Payer: Medicaid Other | Attending: Nurse Practitioner | Admitting: Nutrition

## 2021-11-18 VITALS — Wt 300.0 lb

## 2021-11-18 DIAGNOSIS — Q909 Down syndrome, unspecified: Secondary | ICD-10-CM

## 2021-11-18 DIAGNOSIS — E782 Mixed hyperlipidemia: Secondary | ICD-10-CM | POA: Diagnosis present

## 2021-11-18 DIAGNOSIS — K582 Mixed irritable bowel syndrome: Secondary | ICD-10-CM | POA: Diagnosis present

## 2021-11-18 DIAGNOSIS — E118 Type 2 diabetes mellitus with unspecified complications: Secondary | ICD-10-CM

## 2021-11-18 DIAGNOSIS — Z6841 Body Mass Index (BMI) 40.0 and over, adult: Secondary | ICD-10-CM | POA: Diagnosis present

## 2021-11-18 NOTE — Progress Notes (Signed)
Medical Nutrition Therapy  Appointment Start time:  [id number]  Appointment End time:  1520  Primary concerns today: Diabetes Type 2  Referral diagnosis: E11.8 Preferred learning style: no preference. Learning readiness: Ready  NUTRITION ASSESSMENT  Here with her mom. Has limited cognitive function and special needs. Lives with her mom. She has Down's Syndrome Changes: Her mom notes they have been working on changing what she has been eating. Taking 30 units twice a day of 70/30 insulin; before breakfast and before dinner. Eating later at night due to her mom's work schedule. Willing to adjust that. She tends to snack during the day and late at night. Drinks regular sodas. Forgot meter and bs logs. Per her mom's memory, FBS: 140-170's in am;   Before dinner times 170-200's.  Anthropometrics  Wt Readings from Last 3 Encounters:  11/18/21 300 lb (136.1 kg)  09/18/21 295 lb (133.8 kg)  09/04/21 (!) 300 lb 3.2 oz (136.2 kg)   Ht Readings from Last 3 Encounters:  09/18/21 4' 7.75" (1.416 m)  09/04/21 4' 7.75" (1.416 m)  02/20/21 5\' 2"  (1.575 m)   Body mass index is 67.86 kg/m. @BMIFA @ Facility age limit for growth percentiles is 20 years. Facility age limit for growth percentiles is 20 years. Lab Results  Component Value Date   HGBA1C 6.8 08/05/2021   CMP Latest Ref Rng & Units 07/03/2020 06/12/2020 03/22/2020  Glucose 70 - 99 mg/dL 170(H) 200(H) -  BUN 6 - 20 mg/dL 9 10 -  Creatinine 0.44 - 1.00 mg/dL 0.72 0.70 0.60  Sodium 135 - 145 mmol/L 138 136 -  Potassium 3.5 - 5.1 mmol/L 4.4 4.0 -  Chloride 98 - 111 mmol/L 97(L) 96(L) -  CO2 22 - 32 mmol/L 29 30 -  Calcium 8.9 - 10.3 mg/dL 9.6 9.1 -  Total Protein 6.5 - 8.1 g/dL - - -  Total Bilirubin 0.3 - 1.2 mg/dL - - -  Alkaline Phos 38 - 126 U/L - - -  AST 15 - 41 U/L - - -  ALT 0 - 44 U/L - - -     Clinical Medical Hx: Dm Type 2, Hypothyrodism, IBS, Downs Syndrome Medications: 70/30, 30 units twice a day. Labs:  Lab Results   Component Value Date   HGBA1C 6.8 08/05/2021   CMP Latest Ref Rng & Units 07/03/2020 06/12/2020 03/22/2020  Glucose 70 - 99 mg/dL 170(H) 200(H) -  BUN 6 - 20 mg/dL 9 10 -  Creatinine 0.44 - 1.00 mg/dL 0.72 0.70 0.60  Sodium 135 - 145 mmol/L 138 136 -  Potassium 3.5 - 5.1 mmol/L 4.4 4.0 -  Chloride 98 - 111 mmol/L 97(L) 96(L) -  CO2 22 - 32 mmol/L 29 30 -  Calcium 8.9 - 10.3 mg/dL 9.6 9.1 -  Total Protein 6.5 - 8.1 g/dL - - -  Total Bilirubin 0.3 - 1.2 mg/dL - - -  Alkaline Phos 38 - 126 U/L - - -  AST 15 - 41 U/L - - -  ALT 0 - 44 U/L - - -    Notable Signs/Symptoms: hunger, increased thirst at times.   Lifestyle & Dietary Hx LIves with her mom. She assists with medication management. She can check her own blood sugars and administers her own insulin  Estimated daily fluid intake: 16 oz water; 32 oz soda Supplements:  Sleep: Doesn't sleep well at night if she sleeps a lot during the day 8 hr most of the time Stress / self-care: none Current average  weekly physical activity: ADL  24-Hr Dietary Recall First Meal: [place] cherrios 1 cup, whole milk, Snack: none Second Meal: Nabs, Pepsi  Snack: goldfish, Diet green tea or water Third Meal:  Short sugar bbq sandwich, fried potatoes,  [place] mist Snack: banana Beverages: water and soda  Estimated Energy Needs Calories: 1200 Carbohydrate: 135g Protein: 90g Fat: 33g   NUTRITION DIAGNOSIS  NB-1.1 Food and nutrition-related knowledge deficit As related to Diabetes Type 2.  As evidenced by A1C 6.8% and diet recall.   NUTRITION INTERVENTION  Nutrition education (E-1) on the following topics:  Nutrition and Diabetes education provided on My Plate, CHO counting, meal planning, portion sizes, timing of meals, avoiding snacks between meals unless having a low blood sugar, target ranges for A1C and blood sugars, signs/symptoms and treatment of hyper/hypoglycemia, monitoring blood sugars, taking medications as prescribed, benefits  of exercising 30 minutes per day and prevention of complications of DM. Lifestyle Medicine - Whole Food, Plant Predominant Nutrition is highly recommended: Eat Plenty of vegetables, Mushrooms, fruits, Legumes, Whole Grains, Nuts, seeds in lieu of processed meats, processed snacks/pastries red meat, poultry, eggs.    -It is better to avoid simple carbohydrates including: Cakes, Sweet Desserts, Ice Cream, Soda (diet and regular), Sweet Tea, Candies, Chips, Cookies, Store Bought Juices, Alcohol in Excess of  1-2 drinks a day, Lemonade,  Artificial Sweeteners, Doughnuts, Coffee Creamers, "Sugar-free" Products, etc, etc.  This is not a complete list.....  Exercise: If you are able: 30 -60 minutes a day ,4 days a week, or 150 minutes a week.  The longer the better.  Combine stretch, strength, and aerobic activities.  If you were told in the past that you have high risk for cardiovascular diseases, you may seek evaluation by your heart doctor prior to initiating moderate to intense exercise programs.  Handouts Provided Include  My Plate Plant based nutrition lifestyle  Learning Style & Readiness for Change Teaching method utilized: Visual & Auditory  Demonstrated degree of understanding via: Teach Back  Barriers to learning/adherence to lifestyle change: Down's syndrome and cognitive function and mobility.  Goals Established by Pt Goals  Cut out sodas and drink water only.  Cut out snack foods and only eat veggies for snacks  Cut out eating after supper.  Walk in house 5 minutes twice a day  Lose 2 lbs a month   MONITORING & EVALUATION Dietary intake, weekly physical activity, and blood sugars in 1 month.  Next Steps  Patient is to work on better food choices and eating meals on time.Marland Kitchen

## 2021-11-18 NOTE — Patient Instructions (Addendum)
Goals  Cut out sodas and drink water only.  Cut out snack foods and only eat veggies for snacks  Cut out eating after supper.  Walk in house 5 minutes twice a day  Lose 2 lbs a month

## 2021-11-19 ENCOUNTER — Encounter: Payer: Self-pay | Admitting: Nutrition

## 2021-12-18 ENCOUNTER — Other Ambulatory Visit: Payer: Self-pay

## 2021-12-18 ENCOUNTER — Ambulatory Visit (HOSPITAL_COMMUNITY)
Admission: RE | Admit: 2021-12-18 | Discharge: 2021-12-18 | Disposition: A | Discharge status: Home / Self Care | Payer: Medicaid Other | Source: Ambulatory Visit | Attending: Urology | Admitting: Urology

## 2021-12-18 DIAGNOSIS — N200 Calculus of kidney: Secondary | ICD-10-CM | POA: Diagnosis present

## 2021-12-25 ENCOUNTER — Ambulatory Visit: Payer: Medicaid Other | Admitting: Urology

## 2021-12-25 ENCOUNTER — Other Ambulatory Visit: Payer: Self-pay

## 2021-12-25 VITALS — BP 100/66 | HR 121 | Ht <= 58 in | Wt 300.0 lb

## 2021-12-25 DIAGNOSIS — N200 Calculus of kidney: Secondary | ICD-10-CM | POA: Diagnosis not present

## 2021-12-25 LAB — URINALYSIS, ROUTINE W REFLEX MICROSCOPIC
Bilirubin, UA: NEGATIVE
Glucose, UA: NEGATIVE
Ketones, UA: NEGATIVE
Nitrite, UA: NEGATIVE
Protein,UA: NEGATIVE
RBC, UA: NEGATIVE
Specific Gravity, UA: 1.025 (ref 1.005–1.030)
Urobilinogen, Ur: 0.2 mg/dL (ref 0.2–1.0)
pH, UA: 5.5 (ref 5.0–7.5)

## 2021-12-25 LAB — MICROSCOPIC EXAMINATION
Epithelial Cells (non renal): >10 /hpf — AB (ref 0–10)
RBC, Urine: NONE SEEN /hpf (ref 0–2)
Renal Epithel, UA: NONE SEEN /hpf

## 2021-12-25 NOTE — Progress Notes (Signed)
d Subjective:  1. Bilateral renal stones      Alexandra Kelley returns today in f/u for history of stones.  She had a right ureteroscopy for multiple right renal stones on 07/11/20.  She had her stent removed on 07/16/20.   Her stones were calcium oxalate.  She had a post op CT in 10/21 and had multiple residual non-obstructing right renal and left renal stones but the stone burden was reduced.   At CT prior to this visit shows possible a small increase in stone burden or probably just a repositioning of the stone burden as she has more stones in the right renal pelvis and less in the RUP that on her prior scan.  She has done well without pain, fever or hematuria. Her UA has 1+ LE.  Her stones are primarily calcium oxalate.  She is trying to hydrate and avoid salt.     ROS:  ROS:  A complete review of systems was performed.  All systems are negative except for pertinent findings as noted.   ROS  Allergies  Allergen Reactions   5-Alpha Reductase Inhibitors    Canagliflozin Other (See Comments)    Severe yeast infections   Melatonin Other (See Comments)   Metformin Other (See Comments)   Metformin And Related     GI distress   Onglyza [Saxagliptin]     headaches   Statins Other (See Comments)   Zofran [Ondansetron Hcl] Other (See Comments)    Itchy rash in mouth   Cefaclor Rash    Rash   Penicillins Rash    Has patient had a PCN reaction causing immediate rash, facial/tongue/throat swelling, SOB or lightheadedness with hypotension: No Has patient had a PCN reaction causing severe rash involving mucus membranes or skin necrosis: No Has patient had a PCN reaction that required hospitalization: No Has patient had a PCN reaction occurring within the last 10 years: Yes If all of the above answers are "NO", then may proceed with Cephalosporin use.    Pregabalin Itching and Rash   Sitagliptin Rash    Outpatient Encounter Medications as of 12/25/2021  Medication Sig   amitriptyline (ELAVIL) 25  MG tablet Take 25 mg by mouth at bedtime.   BD PEN NEEDLE NANO 2ND GEN 32G X 4 MM MISC Use as directed to inject insulin twice daily   dicyclomine (BENTYL) 10 MG capsule Take 1 capsule (10 mg total) by mouth 3 (three) times daily as needed for spasms. (Patient taking differently: Take 10 mg by mouth every 8 (eight) hours as needed (stomach spasms.).)   gabapentin (NEURONTIN) 300 MG capsule Take 300 mg by mouth 3 (three) times daily.   glucose blood (ACCU-CHEK AVIVA PLUS) test strip USE TO TEST BLOOD SUGAR TWICE A DAY AS DIRECTED BY DOCTOR 90   glucose blood test strip See admin instructions.   hydrOXYzine (ATARAX/VISTARIL) 25 MG tablet Take 25 mg by mouth 2 (two) times daily as needed for itching or anxiety.   insulin aspart protamine - aspart (NOVOLOG MIX 70/30 FLEXPEN) (70-30) 100 UNIT/ML FlexPen Inject 30 Units into the skin 2 (two) times daily with a meal. 30 units with breakfast and 30 units with supper   levothyroxine (SYNTHROID) 150 MCG tablet Take 1 tablet (150 mcg total) by mouth daily before breakfast.   nystatin cream (MYCOSTATIN) APPLY TO AFFECTED AREA TWICE A DAY   pantoprazole (PROTONIX) 40 MG tablet TAKE 1 TABLET BY MOUTH DAILY BEFORE BREAKFAST   rosuvastatin (CRESTOR) 40 MG tablet Take 40 mg  by mouth daily.   silver sulfADIAZINE (SILVADENE) 1 % cream Apply 1 application topically daily. (Patient taking differently: Apply 1 application topically daily as needed (wound care).)   traMADol (ULTRAM) 50 MG tablet TAKE 1 TABLET BY MOUTH EVERY 12 HOURS AS NEEDED (Patient taking differently: Take 50 mg by mouth 2 (two) times daily.)   No facility-administered encounter medications on file as of 12/25/2021.    Past Medical History:  Diagnosis Date   Arthritis    back   Carpal tunnel syndrome    both wrists   Diabetes mellitus without complication ([place])    Dr. Bubba Camp   GERD (gastroesophageal reflux disease)    History of kidney stones    Hyperlipidemia    Hypothyroidism    Kidney  stone    Neuropathy    feet and hands   Thyroid disease     Past Surgical History:  Procedure Laterality Date   BACK SURGERY  1990s   CYSTOSCOPY/URETEROSCOPY/HOLMIUM LASER/STENT PLACEMENT Right 06/20/2020   Procedure: CYSTOSCOPY, RIGHT RETROGRADE /STENT PLACEMENT;  Surgeon: Irine Seal, MD;  Location: [facility];  Service: Urology;  Laterality: Right;   CYSTOSCOPY/URETEROSCOPY/HOLMIUM LASER/STENT PLACEMENT Right 07/11/2020   Procedure: CYSTOSCOPY RIGHT URETEROSCOPY/HOLMIUM LASER/STENT EXCHANGE;  Surgeon: Irine Seal, MD;  Location: [facility];  Service: Urology;  Laterality: Right;   FEMUR IM NAIL Left 10/03/2013   Procedure: INTRAMEDULLARY (IM) RETROGRADE FEMORAL NAILING;  Surgeon: Marybelle Killings, MD;  Location: [facility];  Service: Orthopedics;  Laterality: Left;   tubes in ears      Social History   Socioeconomic History   Marital status: Single    Spouse name: Not on file   Number of children: Not on file   Years of education: 12   Highest education level: Not on file  Occupational History   Not on file  Tobacco Use   Smoking status: Never   Smokeless tobacco: Never  Vaping Use   Vaping Use: Never used  Substance and Sexual Activity   Alcohol use: No   Drug use: No   Sexual activity: Never    Birth control/protection: None  Other Topics Concern   Not on file  Social History Narrative   Patient lives at home with her parents [place] and 20.   Patient has a high school certificate.   Right handed.   Caffeine coke cola sometimes.            Social Determinants of Health   Financial Resource Strain: Not on file  Food Insecurity: Not on file  Transportation Needs: Not on file  Physical Activity: Not on file  Stress: Not on file  Social Connections: Not on file  Intimate Partner Violence: Not on file    Family History  Problem Relation Age of Onset   Thyroid disease Mother    Hyperlipidemia Mother    Kidney Stones Father    Thyroid disease Maternal Grandmother    Other  Maternal Grandmother        heart issue   Heart attack Maternal Grandfather    Diabetes Maternal Grandfather    Colon cancer Maternal Grandfather    Cancer Maternal Grandfather        prostate   Heart attack Paternal Grandmother        Objective: Vitals:   12/25/21 1543  BP: 100/66  Pulse: (!) 121     Physical Exam  Lab Results:  Results for orders placed or performed in visit on 12/25/21 (from the past 24 hour(s))  Urinalysis,  Routine w reflex microscopic     Status: Abnormal   Collection Time: 12/25/21  4:20 PM  Result Value Ref Range   Specific Gravity, UA 1.025 1.005 - 1.030   pH, UA 5.5 5.0 - 7.5   Color, UA Yellow Yellow   Appearance Ur Clear Clear   Leukocytes,UA 1+ (A) Negative   Protein,UA Negative Negative/Trace   Glucose, UA Negative Negative   Ketones, UA Negative Negative   RBC, UA Negative Negative   Bilirubin, UA Negative Negative   Urobilinogen, Ur 0.2 0.2 - 1.0 mg/dL   Nitrite, UA Negative Negative   Microscopic Examination See below:    Narrative   Performed at:  [address] - [facility] [address] [place], [place]  [id number] Lab Director: Chinita Pester MT, Phone:  [phone]  Microscopic Examination     Status: Abnormal   Collection Time: 12/25/21  4:20 PM   Urine  Result Value Ref Range   WBC, UA 6-10 (A) 0 - 5 /hpf   RBC None seen 0 - 2 /hpf   Epithelial Cells (non renal) >10 (A) 0 - 10 /hpf   Renal Epithel, UA None seen None seen /hpf   Bacteria, UA Few None seen/Few   Narrative   Performed at:  [address] - [facility] [address], [place], [place]  [id number] Lab Director: Chinita Pester MT, Phone:  [phone]     BMET No results for input(s): NA, K, CL, CO2, GLUCOSE, BUN, CREATININE, CALCIUM in the last 72 hours. PSA No results found for: PSA No results found for: TESTOSTERONE    Studies/Results: CT RENAL STONE STUDY  Result Date: 12/19/2021 CLINICAL DATA:  Follow-up renal stones. EXAM: CT ABDOMEN AND PELVIS  WITHOUT CONTRAST TECHNIQUE: Multidetector CT imaging of the abdomen and pelvis was performed following the standard protocol without IV contrast. RADIATION DOSE REDUCTION: This exam was performed according to the departmental dose-optimization program which includes automated exposure control, adjustment of the mA and/or kV according to patient size and/or use of iterative reconstruction technique. COMPARISON:  August 28, 2020 FINDINGS: Lower chest: No acute abnormality. Hepatobiliary: Hepatic steatosis. Riedel's lobe configuration. Gallbladder is unremarkable. No biliary ductal dilation. Pancreas: No pancreatic ductal dilation or evidence of acute inflammation. Spleen: Similar mild splenomegaly of indeterminate clinical significance. Adrenals/Urinary Tract: Bilateral adrenal glands appear normal. No hydronephrosis. Single nonobstructive 6 mm left renal stone. Multiple nonobstructive right renal stones including a cluster of stones in the renal pelvis measuring up to 7 mm. No obstructive ureteral or bladder calculi identified. Urinary bladder is unremarkable for degree of distension. Stomach/Bowel: No radiopaque enteric contrast material was administered. Stomach is unremarkable for degree of distension. No pathologic dilation of small or large bowel. The appendix and terminal ileum appear normal. Colon is predominately decompressed limiting evaluation. Vascular/Lymphatic: Aortic atherosclerosis without abdominal aortic aneurysm. No pathologically enlarged abdominal or pelvic lymph nodes. Reproductive: Uterus and bilateral adnexa are unremarkable. Other: Moderate sized ventral hernia containing fat and nonobstructed loops of bowel with a 7.7 cm aperture with appears similar prior. Musculoskeletal: Partially visualized left femoral intramedullary fixation hardware. Levoconvex curvature of the thoracolumbar spine with associated degenerative change. No acute osseous abnormality. IMPRESSION: 1. Bilateral  nonobstructive nephrolithiasis, as described above. No obstructive ureteral or bladder calculi identified. 2. Moderate sized ventral hernia containing fat and nonobstructed loops of bowel with a 7.7 cm aperture with appears similar prior. 3. Hepatic steatosis. 4. Similar mild splenomegaly of indeterminate clinical significance. 5.  Aortic Atherosclerosis (ICD10-I70.0). Electronically Signed   By:  Dahlia Bailiff M.D.   On: 12/19/2021 11:02       UA reviewed.  She has 6-10 WBC's with few bacteria.    Assessment & Plan: Right renal stones doing well s/p right URS.   The CT prior to this visit shows some repostioning of the right renal stones into the renal pelvis from the upper pole but there is no obstruction.  I will have her return in 6 months with a KUB and RUS and then get another CT in a year.  I discussed diet and hydration and suggested lemon crystal light for the citrate load.    Pyuria with bacteria.  She has no UTI symptoms and her last culture grew Mx Species.    No orders of the defined types were placed in this encounter.     Orders Placed This Encounter  Procedures   Microscopic Examination   DG Abd 1 View    Standing Status:   Future    Standing Expiration Date:   12/25/2022    Order Specific Question:   Reason for Exam (SYMPTOM  OR DIAGNOSIS REQUIRED)    Answer:   renal stones    Order Specific Question:   Preferred imaging location?    Answer:   [facility]    Order Specific Question:   Radiology Contrast Protocol - do NOT remove file path    Answer:   [email or web address]    Order Specific Question:   Is patient pregnant?    Answer:   No   US RENAL    Standing Status:   Future    Standing Expiration Date:   12/25/2022    Order Specific Question:   Reason for Exam (SYMPTOM  OR DIAGNOSIS REQUIRED)    Answer:   renal stones    Order Specific Question:   Preferred imaging location?    Answer:   [facility]   Urinalysis, Routine w reflex microscopic      Return in about 6 months (around 06/24/2022) for with KUB and renal US. .   CC: Denyce Robert, FNP  And Dr. Hildred Laser.    Irine Seal 12/25/2021 Patient ID: Earney Mallet, female   DOB: 1980/12/17, 42 y.o.   MRN: [id number]

## 2021-12-26 LAB — TSH: TSH: 0.578 u[IU]/mL (ref 0.450–4.500)

## 2021-12-26 LAB — T4, FREE: Free T4: 1.32 ng/dL (ref 0.82–1.77)

## 2022-01-01 ENCOUNTER — Ambulatory Visit: Payer: Medicaid Other | Admitting: Nurse Practitioner

## 2022-01-01 ENCOUNTER — Ambulatory Visit: Payer: Medicaid Other | Admitting: Nutrition

## 2022-01-15 ENCOUNTER — Encounter: Payer: Self-pay | Admitting: Nurse Practitioner

## 2022-01-15 ENCOUNTER — Other Ambulatory Visit: Payer: Self-pay

## 2022-01-15 ENCOUNTER — Encounter: Payer: Medicaid Other | Attending: Nurse Practitioner | Admitting: Nutrition

## 2022-01-15 ENCOUNTER — Ambulatory Visit: Payer: Medicaid Other | Admitting: Nurse Practitioner

## 2022-01-15 VITALS — BP 94/61 | HR 102 | Ht <= 58 in | Wt 298.0 lb

## 2022-01-15 DIAGNOSIS — Q909 Down syndrome, unspecified: Secondary | ICD-10-CM | POA: Diagnosis present

## 2022-01-15 DIAGNOSIS — E118 Type 2 diabetes mellitus with unspecified complications: Secondary | ICD-10-CM | POA: Insufficient documentation

## 2022-01-15 DIAGNOSIS — K582 Mixed irritable bowel syndrome: Secondary | ICD-10-CM | POA: Insufficient documentation

## 2022-01-15 DIAGNOSIS — E039 Hypothyroidism, unspecified: Secondary | ICD-10-CM | POA: Diagnosis not present

## 2022-01-15 DIAGNOSIS — Z794 Long term (current) use of insulin: Secondary | ICD-10-CM | POA: Diagnosis not present

## 2022-01-15 DIAGNOSIS — E782 Mixed hyperlipidemia: Secondary | ICD-10-CM | POA: Diagnosis present

## 2022-01-15 DIAGNOSIS — Z6841 Body Mass Index (BMI) 40.0 and over, adult: Secondary | ICD-10-CM | POA: Insufficient documentation

## 2022-01-15 DIAGNOSIS — E119 Type 2 diabetes mellitus without complications: Secondary | ICD-10-CM | POA: Diagnosis not present

## 2022-01-15 LAB — POCT GLYCOSYLATED HEMOGLOBIN (HGB A1C): HbA1c, POC (controlled diabetic range): 7.9 % — AB (ref 0.0–7.0)

## 2022-01-15 NOTE — Progress Notes (Signed)
Medical Nutrition Therapy  DM and obesity follow up  ?Appointment Start time:  1530 Appointment End time:  1600 ? ?Primary concerns today: Diabetes Type 2  ?Referral diagnosis: E11.8 ?Preferred learning style: no preference. ?Learning readiness: Ready ? ?NUTRITION ASSESSMENT  ?Here with her mom.  ?A1C increased to 7.9% from 6.8%. ?Saw [name] today.  ?30 units of 70/30 twice a day.  ?Has been eating a lot of snacks between meals and at night. ?Gained 5 lbs since November 2022. ? ?Anthropometrics  ?Wt Readings from Last 3 Encounters:  ?12/25/21 300 lb (136.1 kg)  ?11/18/21 300 lb (136.1 kg)  ?09/18/21 295 lb (133.8 kg)  ? ?Ht Readings from Last 3 Encounters:  ?12/25/21 4\' 8"  (1.422 m)  ?09/18/21 4' 7.75" (1.416 m)  ?09/04/21 4' 7.75" (1.416 m)  ? ?There is no height or weight on file to calculate BMI. ?@BMIFA @ ?Facility age limit for growth percentiles is 20 years. ?Facility age limit for growth percentiles is 20 years. ?Lab Results  ?Component Value Date  ? HGBA1C 6.8 08/05/2021  ? ?CMP Latest Ref Rng & Units 07/03/2020 06/12/2020 03/22/2020  ?Glucose 70 - 99 mg/dL 08/12/2020) 03/24/2020) -  ?BUN 6 - 20 mg/dL 9 10 -  ?Creatinine 0.44 - 1.00 mg/dL 562(B 638(L 3.73  ?Sodium 135 - 145 mmol/L 138 136 -  ?Potassium 3.5 - 5.1 mmol/L 4.4 4.0 -  ?Chloride 98 - 111 mmol/L 97(L) 96(L) -  ?CO2 22 - 32 mmol/L 29 30 -  ?Calcium 8.9 - 10.3 mg/dL 9.6 9.1 -  ?Total Protein 6.5 - 8.1 g/dL - - -  ?Total Bilirubin 0.3 - 1.2 mg/dL - - -  ?Alkaline Phos 38 - 126 U/L - - -  ?AST 15 - 41 U/L - - -  ?ALT 0 - 44 U/L - - -  ? ? ? ?Clinical ?Medical Hx: Dm Type 2, Hypothyrodism, IBS, Downs Syndrome ?Medications: 70/30, 30 units twice a day. ?Labs:  ?Lab Results  ?Component Value Date  ? HGBA1C 6.8 08/05/2021  ? ?CMP Latest Ref Rng & Units 07/03/2020 06/12/2020 03/22/2020  ?Glucose 70 - 99 mg/dL 08/12/2020) 03/24/2020) -  ?BUN 6 - 20 mg/dL 9 10 -  ?Creatinine 0.44 - 1.00 mg/dL 115(B 262(M 3.55  ?Sodium 135 - 145 mmol/L 138 136 -  ?Potassium 3.5 - 5.1 mmol/L 4.4 4.0 -   ?Chloride 98 - 111 mmol/L 97(L) 96(L) -  ?CO2 22 - 32 mmol/L 29 30 -  ?Calcium 8.9 - 10.3 mg/dL 9.6 9.1 -  ?Total Protein 6.5 - 8.1 g/dL - - -  ?Total Bilirubin 0.3 - 1.2 mg/dL - - -  ?Alkaline Phos 38 - 126 U/L - - -  ?AST 15 - 41 U/L - - -  ?ALT 0 - 44 U/L - - -  ? ? ?Notable Signs/Symptoms: hunger, increased thirst at times.  ? ?Lifestyle & Dietary Hx ?LIves with her mom. She assists with medication management. ?She can check her own blood sugars and administers her own insulin ? ?Estimated daily fluid intake: 16 oz water; 32 oz soda ?Supplements:  ?Sleep: Doesn't sleep well at night if she sleeps a lot during the day 8 hr most of the time ?Stress / self-care: none ?Current average weekly physical activity: ADL ? ?24-Hr Dietary Recall ?First Meal: Honey nut cherrios 1 cup,  Diet diet tea ?Snack: none ?Second Meal: pack a nabs, Diet tea ?Snack: sherbet ?Third Meal:  Hot dog 1, steak fries, Diet tea ?Snack: Sherbet ?Beverages: water and soda ? ?  Estimated Energy Needs ?Calories: 1200 ?Carbohydrate: 135g ?Protein: 90g ?Fat: 33g ? ? ?NUTRITION DIAGNOSIS  ?NB-1.1 Food and nutrition-related knowledge deficit As related to Diabetes Type 2.  As evidenced by A1C 6.8% and diet recall. ? ? ?NUTRITION INTERVENTION  ?Nutrition education (E-1) on the following topics:  ?Nutrition and Diabetes education provided on My Plate, CHO counting, meal planning, portion sizes, timing of meals, avoiding snacks between meals unless having a low blood sugar, target ranges for A1C and blood sugars, signs/symptoms and treatment of hyper/hypoglycemia, monitoring blood sugars, taking medications as prescribed, benefits of exercising 30 minutes per day and prevention of complications of DM. ?Lifestyle Medicine ?- Whole Food, Plant Predominant Nutrition is highly recommended: Eat Plenty of vegetables, Mushrooms, fruits, Legumes, Whole Grains, Nuts, seeds in lieu of processed meats, processed snacks/pastries red meat, poultry, eggs.  ?  ?-It is  better to avoid simple carbohydrates including: Cakes, Sweet Desserts, Ice Cream, Soda (diet and regular), Sweet Tea, Candies, Chips, Cookies, Store Bought Juices, Alcohol in Excess of  1-2 drinks a day, Lemonade,  Artificial Sweeteners, Doughnuts, Coffee Creamers, "Sugar-free" Products, etc, etc.  This is not a complete list..... ? ?Exercise: If you are able: 30 -60 minutes a day ,4 days a week, or 150 minutes a week.  The longer the better.  Combine stretch, strength, and aerobic activities.  If you were told in the past that you have high risk for cardiovascular diseases, you may seek evaluation by your heart doctor prior to initiating moderate to intense exercise programs. ? ?Handouts Provided Include  ?My Plate ?Plant based nutrition lifestyle ? ?Learning Style & Readiness for Change ?Teaching method utilized: Visual & Auditory  ?Demonstrated degree of understanding via: Teach Back  ?Barriers to learning/adherence to lifestyle change: Down's syndrome and cognitive function and mobility. ? ?Goals Established by Pt ? ?Goals ? ?Drink more water 3 bottles per day Try the true lemon in it. ?Cut down to 1 cup of diet tea ?Have fruit with your meals and increase vegetables. ?Try oatmeal for breakfast,  ?Get A1C down to 7%. ?Lose 3 lbs til next visit. ?Increase walking in the house. ? ?Go to the Full Plate Living website ? ?MONITORING & EVALUATION ?Dietary intake, weekly physical activity, and blood sugars in 1 month. ? ?Next Steps  ?Patient is to work on better food choices and eating meals on time.. ? ?

## 2022-01-15 NOTE — Progress Notes (Signed)
? ?                                                    Endocrinology Follow Up Note  ?     01/15/2022, 3:56 PM ? ? ?Subjective:  ? ? Patient ID: Alexandra Kelley, female    DOB: November 25, 1980.  ?Rashelle A Hausman is being seen in follow up after being seen in consultation for management of currently uncontrolled symptomatic diabetes and hypothyroidism requested by  Marylynn Pearson, FNP. ? ? ?Past Medical History:  ?Diagnosis Date  ? Arthritis   ? back  ? Carpal tunnel syndrome   ? both wrists  ? Diabetes mellitus without complication (HCC)   ? Dr. Lurene Shadow  ? GERD (gastroesophageal reflux disease)   ? History of kidney stones   ? Hyperlipidemia   ? Hypothyroidism   ? Kidney stone   ? Neuropathy   ? feet and hands  ? Thyroid disease   ? ? ?Past Surgical History:  ?Procedure Laterality Date  ? BACK SURGERY  1990s  ? CYSTOSCOPY/URETEROSCOPY/HOLMIUM LASER/STENT PLACEMENT Right 06/20/2020  ? Procedure: CYSTOSCOPY, RIGHT RETROGRADE /STENT PLACEMENT;  Surgeon: Bjorn Pippin, MD;  Location: [facility];  Service: Urology;  Laterality: Right;  ? CYSTOSCOPY/URETEROSCOPY/HOLMIUM LASER/STENT PLACEMENT Right 07/11/2020  ? Procedure: CYSTOSCOPY RIGHT URETEROSCOPY/HOLMIUM LASER/STENT EXCHANGE;  Surgeon: Bjorn Pippin, MD;  Location: [facility];  Service: Urology;  Laterality: Right;  ? FEMUR IM NAIL Left 10/03/2013  ? Procedure: INTRAMEDULLARY (IM) RETROGRADE FEMORAL NAILING;  Surgeon: Eldred Manges, MD;  Location: [facility];  Service: Orthopedics;  Laterality: Left;  ? tubes in ears    ? ? ?Social History  ? ?Socioeconomic History  ? Marital status: Single  ?  Spouse name: Not on file  ? Number of children: Not on file  ? Years of education: 52  ? Highest education level: Not on file  ?Occupational History  ? Not on file  ?Tobacco Use  ? Smoking status: Never  ? Smokeless tobacco: Never  ?Vaping Use  ? Vaping Use: Never used  ?Substance and Sexual Activity  ? Alcohol use: No  ? Drug use: No  ? Sexual activity: Never  ?  Birth  control/protection: None  ?Other Topics Concern  ? Not on file  ?Social History Narrative  ? Patient lives at home with her parents [place] and [place].  ? Patient has a high school certificate.  ? Right handed.  ? Caffeine coke cola sometimes.  ?   ?   ?   ? ?Social Determinants of Health  ? ?Financial Resource Strain: Not on file  ?Food Insecurity: Not on file  ?Transportation Needs: Not on file  ?Physical Activity: Not on file  ?Stress: Not on file  ?Social Connections: Not on file  ? ? ?Family History  ?Problem Relation Age of Onset  ? Thyroid disease Mother   ? Hyperlipidemia Mother   ? Kidney Stones Father   ? Thyroid disease Maternal Grandmother   ? Other Maternal Grandmother   ?     heart issue  ? Heart attack Maternal Grandfather   ? Diabetes Maternal Grandfather   ? Colon cancer Maternal Grandfather   ? Cancer Maternal Grandfather   ?     prostate  ? Heart attack Paternal Grandmother   ? ? ?Outpatient Encounter Medications as of 01/15/2022  ?  Medication Sig  ? amitriptyline (ELAVIL) 25 MG tablet Take 25 mg by mouth at bedtime.  ? BD PEN NEEDLE NANO 2ND GEN 32G X 4 MM MISC Use as directed to inject insulin twice daily  ? dicyclomine (BENTYL) 10 MG capsule Take 1 capsule (10 mg total) by mouth 3 (three) times daily as needed for spasms. (Patient taking differently: Take 10 mg by mouth every 8 (eight) hours as needed (stomach spasms.).)  ? gabapentin (NEURONTIN) 300 MG capsule Take 300 mg by mouth 3 (three) times daily.  ? glucose blood (ACCU-CHEK AVIVA PLUS) test strip USE TO TEST BLOOD SUGAR TWICE A DAY AS DIRECTED BY DOCTOR 90  ? glucose blood test strip See admin instructions.  ? hydrOXYzine (ATARAX/VISTARIL) 25 MG tablet Take 25 mg by mouth 2 (two) times daily as needed for itching or anxiety.  ? insulin aspart protamine - aspart (NOVOLOG MIX 70/30 FLEXPEN) (70-30) 100 UNIT/ML FlexPen Inject 30 Units into the skin 2 (two) times daily with a meal. 30 units with breakfast and 30 units with supper  ?  levothyroxine (SYNTHROID) 150 MCG tablet Take 1 tablet (150 mcg total) by mouth daily before breakfast.  ? nystatin cream (MYCOSTATIN) APPLY TO AFFECTED AREA TWICE A DAY  ? pantoprazole (PROTONIX) 40 MG tablet TAKE 1 TABLET BY MOUTH DAILY BEFORE BREAKFAST  ? rosuvastatin (CRESTOR) 40 MG tablet Take 40 mg by mouth daily.  ? silver sulfADIAZINE (SILVADENE) 1 % cream Apply 1 application topically daily. (Patient taking differently: Apply 1 application. topically daily as needed (wound care).)  ? traMADol (ULTRAM) 50 MG tablet TAKE 1 TABLET BY MOUTH EVERY 12 HOURS AS NEEDED (Patient taking differently: Take 50 mg by mouth 2 (two) times daily.)  ? ?No facility-administered encounter medications on file as of 01/15/2022.  ? ? ?ALLERGIES: ?Allergies  ?Allergen Reactions  ? 5-Alpha Reductase Inhibitors   ? Canagliflozin Other (See Comments)  ?  Severe yeast infections  ? Melatonin Other (See Comments)  ? Metformin Other (See Comments)  ? Metformin And Related   ?  GI distress  ? Onglyza [Saxagliptin]   ?  headaches  ? Statins Other (See Comments)  ? Zofran [Ondansetron Hcl] Other (See Comments)  ?  Itchy rash in mouth  ? Cefaclor Rash  ?  Rash  ? Penicillins Rash  ?  Has patient had a PCN reaction causing immediate rash, facial/tongue/throat swelling, SOB or lightheadedness with hypotension: No ?Has patient had a PCN reaction causing severe rash involving mucus membranes or skin necrosis: No ?Has patient had a PCN reaction that required hospitalization: No ?Has patient had a PCN reaction occurring within the last 10 years: Yes ?If all of the above answers are "NO", then may proceed with Cephalosporin use. ?  ? Pregabalin Itching and Rash  ? Sitagliptin Rash  ? ? ?VACCINATION STATUS: ?There is no immunization history for the selected administration types on file for this patient. ? ?Diabetes ?She presents for her follow-up diabetic visit. She has type 2 diabetes mellitus. Onset time: diagnosed at approx age of 41. Her disease  course has been worsening. There are no hypoglycemic associated symptoms. Pertinent negatives for hypoglycemia include no nervousness/anxiousness or tremors. There are no diabetic associated symptoms. Pertinent negatives for diabetes include no fatigue. There are no hypoglycemic complications. Symptoms are stable. Diabetic complications include peripheral neuropathy. Risk factors for coronary artery disease include diabetes mellitus and obesity. Current diabetic treatment includes insulin injections. She is compliant with treatment most of the time. Her weight  is decreasing steadily. She is following a generally healthy diet. When asked about meal planning, she reported none. She has not had a previous visit with a dietitian. She never participates in exercise. Her home blood glucose trend is fluctuating minimally. Her breakfast blood glucose range is generally 140-180 mg/dl. Her lunch blood glucose range is generally 140-180 mg/dl. Her dinner blood glucose range is generally >200 mg/dl. Her bedtime blood glucose range is generally >200 mg/dl. Her overall blood glucose range is 180-200 mg/dl. (She presents today, accompanied by her mother, with her meter and logs showing above target glycemic profile overall.  Her POCT A1c today is 7.9%, increasing from previous visit of 6.8%.  She admits she has not done as well with her diet recently, has been eating snacks late night.  Analysis of her meter shows 7-day average of 185, 14-day average of 198, 30-day average of 198.  She denies any hypoglycemia.) An ACE inhibitor/angiotensin II receptor blocker is not being taken. She sees a podiatrist.Eye exam is current.  ?Hyperlipidemia ?This is a chronic problem. The current episode started more than 1 year ago. Exacerbating diseases include diabetes, hypothyroidism and obesity. Factors aggravating her hyperlipidemia include fatty foods. Current antihyperlipidemic treatment includes statins. Compliance problems include adherence  to diet and adherence to exercise.  Risk factors for coronary artery disease include diabetes mellitus, dyslipidemia, family history, obesity and a sedentary lifestyle.  ?Thyroid Problem ?Presents for initial visit. [employer]

## 2022-01-15 NOTE — Patient Instructions (Addendum)
Goals ? ?Drink more water 3 bottles per day Try the true lemon in it. ?Cut down to 1 cup of diet tea ?Have fruit with your meals and increase vegetables. ?Try oatmeal for breakfast,  ?Get A1C down to 7%. ?Lose 3 lbs til next visit. ?Increase walking in the house. ? ?Go to the [employer] website ? ?

## 2022-01-15 NOTE — Patient Instructions (Signed)

## 2022-02-02 ENCOUNTER — Encounter: Payer: Self-pay | Admitting: Nutrition

## 2022-03-15 ENCOUNTER — Other Ambulatory Visit: Payer: Self-pay | Admitting: Nurse Practitioner

## 2022-04-01 ENCOUNTER — Telehealth: Payer: Self-pay | Admitting: Adult Health

## 2022-04-01 ENCOUNTER — Other Ambulatory Visit: Payer: Self-pay | Admitting: *Deleted

## 2022-04-01 MED ORDER — NYSTATIN 100000 UNIT/GM EX CREA: TOPICAL_CREAM | CUTANEOUS | 3 refills | Status: DC

## 2022-04-01 NOTE — Telephone Encounter (Signed)
Pts mother informed of rx to pharmacy.

## 2022-04-01 NOTE — Telephone Encounter (Signed)
Patient's mom called stating that her daughter needs a refill of her mycostatin and she uses [facility]. Please contact pt's mom when medication has been called in.

## 2022-04-01 NOTE — Telephone Encounter (Signed)
Refill request to [facility].  

## 2022-05-21 ENCOUNTER — Ambulatory Visit: Payer: Medicaid Other | Admitting: Nutrition

## 2022-05-21 ENCOUNTER — Ambulatory Visit: Payer: Medicaid Other | Admitting: Nurse Practitioner

## 2022-06-18 ENCOUNTER — Encounter: Payer: Self-pay | Admitting: Nurse Practitioner

## 2022-06-18 ENCOUNTER — Ambulatory Visit: Payer: Medicaid Other | Admitting: Nutrition

## 2022-06-18 ENCOUNTER — Ambulatory Visit: Payer: Medicaid Other | Admitting: Nurse Practitioner

## 2022-06-18 VITALS — BP 137/79 | HR 96 | Ht <= 58 in | Wt 307.0 lb

## 2022-06-18 DIAGNOSIS — Z794 Long term (current) use of insulin: Secondary | ICD-10-CM

## 2022-06-18 DIAGNOSIS — E119 Type 2 diabetes mellitus without complications: Secondary | ICD-10-CM

## 2022-06-18 DIAGNOSIS — E039 Hypothyroidism, unspecified: Secondary | ICD-10-CM

## 2022-06-18 LAB — POCT GLYCOSYLATED HEMOGLOBIN (HGB A1C): Hemoglobin A1C: 7.6 % — AB (ref 4.0–5.6)

## 2022-06-18 MED ORDER — DEXCOM G7 RECEIVER DEVI: 1.0000 | Freq: Once | 0 refills | Status: AC

## 2022-06-18 MED ORDER — LEVOTHYROXINE SODIUM 150 MCG PO TABS: 150.0000 ug | ORAL_TABLET | Freq: Every day | ORAL | 1 refills | Status: DC

## 2022-06-18 MED ORDER — DEXCOM G7 SENSOR MISC: 1.0000 | 3 refills | Status: DC

## 2022-06-18 MED ORDER — NOVOLOG MIX 70/30 FLEXPEN (70-30) 100 UNIT/ML ~~LOC~~ SUPN: 30.0000 [IU] | PEN_INJECTOR | Freq: Two times a day (BID) | SUBCUTANEOUS | 3 refills | Status: DC

## 2022-06-18 NOTE — Progress Notes (Signed)
Endocrinology Follow Up Note       06/18/2022, 3:32 PM   Subjective:    Patient ID: Alexandra Kelley, female    DOB: 11/28/1980.  Alexandra Kelley is being seen in follow up after being seen in consultation for management of currently uncontrolled symptomatic diabetes and hypothyroidism requested by  Marylynn Pearson, FNP.   Past Medical History:  Diagnosis Date   Arthritis    back   Carpal tunnel syndrome    both wrists   Diabetes mellitus without complication (HCC)    Dr. Lurene Shadow   GERD (gastroesophageal reflux disease)    History of kidney stones    Hyperlipidemia    Hypothyroidism    Kidney stone    Neuropathy    feet and hands   Thyroid disease     Past Surgical History:  Procedure Laterality Date   BACK SURGERY  1990s   CYSTOSCOPY/URETEROSCOPY/HOLMIUM LASER/STENT PLACEMENT Right 06/20/2020   Procedure: CYSTOSCOPY, RIGHT RETROGRADE /STENT PLACEMENT;  Surgeon: Bjorn Pippin, MD;  Location: [facility];  Service: Urology;  Laterality: Right;   CYSTOSCOPY/URETEROSCOPY/HOLMIUM LASER/STENT PLACEMENT Right 07/11/2020   Procedure: CYSTOSCOPY RIGHT URETEROSCOPY/HOLMIUM LASER/STENT EXCHANGE;  Surgeon: Bjorn Pippin, MD;  Location: [facility];  Service: Urology;  Laterality: Right;   FEMUR IM NAIL Left 10/03/2013   Procedure: INTRAMEDULLARY (IM) RETROGRADE FEMORAL NAILING;  Surgeon: Eldred Manges, MD;  Location: [facility];  Service: Orthopedics;  Laterality: Left;   tubes in ears      Social History   Socioeconomic History   Marital status: Single    Spouse name: Not on file   Number of children: Not on file   Years of education: 12   Highest education level: Not on file  Occupational History   Not on file  Tobacco Use   Smoking status: Never   Smokeless tobacco: Never  Vaping Use   Vaping Use: Never used  Substance and Sexual Activity   Alcohol use: No   Drug use: No   Sexual activity: Never    Birth  control/protection: None  Other Topics Concern   Not on file  Social History Narrative   Patient lives at home with her parents Alexandra Kelley.   Patient has a high school certificate.   Right handed.   Caffeine coke cola sometimes.            Social Determinants of Health   Financial Resource Strain: Not on file  Food Insecurity: Not on file  Transportation Needs: Not on file  Physical Activity: Not on file  Stress: Not on file  Social Connections: Not on file    Family History  Problem Relation Age of Onset   Thyroid disease Mother    Hyperlipidemia Mother    Kidney Stones Father    Thyroid disease Maternal Grandmother    Other Maternal Grandmother        heart issue   Heart attack Maternal Grandfather    Diabetes Maternal Grandfather    Colon cancer Maternal Grandfather    Cancer Maternal Grandfather        prostate   Heart attack Paternal Grandmother     Outpatient Encounter Medications as of 06/18/2022  Medication Sig   Continuous Blood Gluc Receiver (DEXCOM G7 RECEIVER) DEVI 1 Device by Does not apply route once for 1 dose.   Continuous Blood Gluc Sensor (DEXCOM G7 SENSOR) MISC Inject 1 Application into the skin as directed. Change sensor every 10 days as directed.   amitriptyline (ELAVIL) 25 MG tablet Take 25 mg by mouth at bedtime.   BD PEN NEEDLE NANO 2ND GEN 32G X 4 MM MISC Use as directed to inject insulin twice daily   dicyclomine (BENTYL) 10 MG capsule Take 1 capsule (10 mg total) by mouth 3 (three) times daily as needed for spasms. (Patient taking differently: Take 10 mg by mouth every 8 (eight) hours as needed (stomach spasms.).)   gabapentin (NEURONTIN) 300 MG capsule Take 300 mg by mouth 3 (three) times daily.   glucose blood (ACCU-CHEK AVIVA PLUS) test strip USE TO TEST BLOOD SUGAR TWICE A DAY AS DIRECTED BY DOCTOR 90   glucose blood test strip See admin instructions.   hydrOXYzine (ATARAX/VISTARIL) 25 MG tablet Take 25 mg by mouth 2 (two) times daily  as needed for itching or anxiety.   insulin aspart protamine - aspart (NOVOLOG MIX 70/30 FLEXPEN) (70-30) 100 UNIT/ML FlexPen Inject 30 Units into the skin 2 (two) times daily with a meal. 30 units with breakfast and 30 units with supper   levothyroxine (SYNTHROID) 150 MCG tablet Take 1 tablet (150 mcg total) by mouth daily before breakfast.   nystatin cream (MYCOSTATIN) APPLY TO AFFECTED AREA TWICE A DAY   pantoprazole (PROTONIX) 40 MG tablet TAKE 1 TABLET BY MOUTH DAILY BEFORE BREAKFAST   rosuvastatin (CRESTOR) 40 MG tablet Take 40 mg by mouth daily.   silver sulfADIAZINE (SILVADENE) 1 % cream Apply 1 application topically daily. (Patient taking differently: Apply 1 application. topically daily as needed (wound care).)   traMADol (ULTRAM) 50 MG tablet TAKE 1 TABLET BY MOUTH EVERY 12 HOURS AS NEEDED (Patient taking differently: Take 50 mg by mouth 2 (two) times daily.)   [DISCONTINUED] insulin aspart protamine - aspart (NOVOLOG MIX 70/30 FLEXPEN) (70-30) 100 UNIT/ML FlexPen Inject 30 Units into the skin 2 (two) times daily with a meal. 30 units with breakfast and 30 units with supper   [DISCONTINUED] levothyroxine (SYNTHROID) 150 MCG tablet TAKE 1 TABLET BY MOUTH DAILY BEFORE BREAKFAST.   No facility-administered encounter medications on file as of 06/18/2022.    ALLERGIES: Allergies  Allergen Reactions   5-Alpha Reductase Inhibitors    Canagliflozin Other (See Comments)    Severe yeast infections   Melatonin Other (See Comments)   Metformin Other (See Comments)   Metformin And Related     GI distress   Onglyza [Saxagliptin]     headaches   Statins Other (See Comments)   Zofran [Ondansetron Hcl] Other (See Comments)    Itchy rash in mouth   Cefaclor Rash    Rash   Penicillins Rash    Has patient had a PCN reaction causing immediate rash, facial/tongue/throat swelling, SOB or lightheadedness with hypotension: No Has patient had a PCN reaction causing severe rash involving mucus  membranes or skin necrosis: No Has patient had a PCN reaction that required hospitalization: No Has patient had a PCN reaction occurring within the last 10 years: Yes If all of the above answers are "NO", then may proceed with Cephalosporin use.    Pregabalin Itching and Rash   Sitagliptin Rash    VACCINATION STATUS: There is no immunization history for the selected administration types on file for  this patient.  Diabetes She presents for her follow-up diabetic visit. She has type 2 diabetes mellitus. Onset time: diagnosed at approx age of 41. Her disease course has been improving. There are no hypoglycemic associated symptoms. Pertinent negatives for hypoglycemia include no nervousness/anxiousness or tremors. There are no diabetic associated symptoms. Pertinent negatives for diabetes include no fatigue. There are no hypoglycemic complications. Symptoms are stable. Diabetic complications include peripheral neuropathy. Risk factors for coronary artery disease include diabetes mellitus and obesity. Current diabetic treatment includes insulin injections. She is compliant with treatment most of the time. Her weight is increasing steadily. She is following a generally healthy diet. When asked about meal planning, she reported none. She has not had a previous visit with a dietitian. She never participates in exercise. Her home blood glucose trend is fluctuating minimally. Her overall blood glucose range is 180-200 mg/dl. (She presents today, accompanied by her mother, with her meter and logs showing above target glycemic profile overall.  Her POCT A1c today is 7.6%, improving from last visit of 8%.  She still late night snacks but is trying to cut back.  She denies any hypoglycemia.  Analysis of her meter shows 7-day average of 190, 14-day average of 190, 30-day average of 188.) An ACE inhibitor/angiotensin II receptor blocker is not being taken. She sees a podiatrist.Eye exam is current.   Hyperlipidemia This is a chronic problem. The current episode started more than 1 year ago. Exacerbating diseases include diabetes, hypothyroidism and obesity. Factors aggravating her hyperlipidemia include fatty foods. Current antihyperlipidemic treatment includes statins. Compliance problems include adherence to diet and adherence to exercise.  Risk factors for coronary artery disease include diabetes mellitus, dyslipidemia, family history, obesity and a sedentary lifestyle.  Thyroid Problem Presents for initial visit. Symptoms include weight gain. Patient reports no anxiety, cold intolerance, constipation, depressed mood, fatigue, hair loss, heat intolerance, leg swelling, palpitations or tremors. Past treatments include levothyroxine. The treatment provided moderate relief. Her past medical history is significant for diabetes, hyperlipidemia and obesity. Risk factors include family history of hypothyroidism.    Review of systems  Constitutional: + Minimally fluctuating body weight,  current Body mass index is 68.83 kg/m. , no fatigue, no subjective hyperthermia, no subjective hypothermia Eyes: no blurry vision, no xerophthalmia ENT: no sore throat, no nodules palpated in throat, no dysphagia/odynophagia, no hoarseness Cardiovascular: no chest pain, no shortness of breath, no palpitations, no leg swelling Respiratory: no cough, no shortness of breath Gastrointestinal: no nausea/vomiting/diarrhea Musculoskeletal: no muscle/joint aches Skin: no rashes, no hyperemia Neurological: no tremors, no numbness, no tingling, no dizziness Psychiatric: no depression, no anxiety  Objective:     BP 137/79   Pulse 96   Ht 4\' 8"  (1.422 m)   Wt (!) 307 lb (139.3 kg)   BMI 68.83 kg/m   Wt Readings from Last 3 Encounters:  06/18/22 (!) 307 lb (139.3 kg)  01/15/22 298 lb (135.2 kg)  12/25/21 300 lb (136.1 kg)     BP Readings from Last 3 Encounters:  06/18/22 137/79  01/15/22 94/61  12/25/21  100/66      Physical Exam- Limited  Constitutional:  Body mass index is 68.83 kg/m. , not in acute distress, normal state of mind Eyes:  EOMI, no exophthalmos Neck: Supple Cardiovascular: RRR, no murmurs, rubs, or gallops, no edema Respiratory: Adequate breathing efforts, no crackles, rales, rhonchi, or wheezing Musculoskeletal: no gross deformities, strength intact in all four extremities, no gross restriction of joint movements Skin:  no rashes, no  hyperemia Neurological: no tremor with outstretched hands    Diabetic Foot Exam - Simple   No data filed     CMP ( most recent) CMP     Component Value Date/Time   NA 138 07/03/2020 1438   K 4.4 07/03/2020 1438   CL 97 (L) 07/03/2020 1438   CO2 29 07/03/2020 1438   GLUCOSE 170 (H) 07/03/2020 1438   BUN 9 07/03/2020 1438   CREATININE 0.72 07/03/2020 1438   CALCIUM 9.6 07/03/2020 1438   PROT 6.8 10/18/2018 0458   ALBUMIN 3.2 (L) 10/18/2018 0458   AST 18 10/18/2018 0458   ALT 19 10/18/2018 0458   ALKPHOS 56 10/18/2018 0458   BILITOT 0.9 10/18/2018 0458   GFRNONAA >60 07/03/2020 1438   GFRAA >60 07/03/2020 1438     Diabetic Labs (most recent): Lab Results  Component Value Date   HGBA1C 7.6 (A) 06/18/2022   HGBA1C 7.9 (A) 01/15/2022   HGBA1C 6.8 08/05/2021     Lipid Panel ( most recent) Lipid Panel  No results found for: "CHOL", "TRIG", "HDL", "CHOLHDL", "VLDL", "LDLCALC", "LDLDIRECT", "LABVLDL"    Lab Results  Component Value Date   TSH 0.578 12/25/2021   TSH 0.033 (L) 09/04/2021   TSH 1.812 10/17/2018   TSH 5.154 (H) 10/03/2013   FREET4 1.32 12/25/2021   FREET4 1.68 09/04/2021           Assessment & Plan:   1) Uncontrolled type 2 diabetes with long term insulin therapy  She presents today, accompanied by her mother, with her meter and logs showing above target glycemic profile overall.  Her POCT A1c today is 7.6%, improving from last visit of 8%.  She still late night snacks but is trying to cut  back.  She denies any hypoglycemia.  Analysis of her meter shows 7-day average of 190, 14-day average of 190, 30-day average of 188.  - Alexandra Kelley has currently uncontrolled symptomatic type 2 DM since 41 years of age.   -Recent labs reviewed.  - I had a long discussion with her about the progressive nature of diabetes and the pathology behind its complications. -her diabetes is complicated by peripheral neuropathy and she remains at a high risk for more acute and chronic complications which include CAD, CVA, CKD, retinopathy, and neuropathy. These are all discussed in detail with her.  - Nutritional counseling repeated at each appointment due to patients tendency to fall back in to old habits.  - The patient admits there is a room for improvement in their diet and drink choices. -  Suggestion is made for the patient to avoid simple carbohydrates from their diet including Cakes, Sweet Desserts / Pastries, Ice Cream, Soda (diet and regular), Sweet Tea, Candies, Chips, Cookies, Sweet Pastries, Store Bought Juices, Alcohol in Excess of 1-2 drinks a day, Artificial Sweeteners, Coffee Creamer, and "Sugar-free" Products. This will help patient to have stable blood glucose profile and potentially avoid unintended weight gain.   - I encouraged the patient to switch to unprocessed or minimally processed complex starch and increased protein intake (animal or plant source), fruits, and vegetables.   - Patient is advised to stick to a routine mealtimes to eat 3 meals a day and avoid unnecessary snacks (to snack only to correct hypoglycemia).  - she will be scheduled with Norm Salt, RDN, CDE for diabetes education.  - I have approached her with the following individualized plan to manage her diabetes and patient agrees:   -She is advised to  continue her premixed insulin 70/30  30 units with breakfast and 30 units with supper if glucose is above 90 and she is eating.  We talked about limiting  late night snacks.  I introduced the idea of adding incretin therapy but mom wants to think about it (says her mother had a bad reaction to a similar med in the past).  -she is encouraged to continue monitoring blood glucose 3 times daily, before injecting insulin and before bed, and to call the clinic if she has readings less than 70 or above 200 for 3 tests in a row.  She could benefit from CGM device.  I sent in Dexcom G7 to local  pharmacy.  - she is warned not to take insulin without proper monitoring per orders. - Adjustment parameters are given to her for hypo and hyperglycemia in writing.  - she will be considered for incretin therapy as appropriate next visit.  - Specific targets for  A1c; LDL, HDL, and Triglycerides were discussed with the patient.  2) Blood Pressure /Hypertension:  her blood pressure is controlled to target without the use of antihypertensives.    3) Lipids/Hyperlipidemia:    Her recent lipid panel from 02/24/22 shows uncontrolled LDL of 127 and elevated triglycerides of 172.   she is advised to continue Crestor 40 mg daily at bedtime.  Side effects and precautions discussed with her.    4)  Weight/Diet:  her Body mass index is 68.83 kg/m.  -  clearly complicating her diabetes care.   she is a candidate for weight loss. I discussed with her the fact that loss of 5 - 10% of her  current body weight will have the most impact on her diabetes management.  Exercise, and detailed carbohydrates information provided  -  detailed on discharge instructions.  5) Hypothyroidism-unspecified There are no recent TFTs to review.  She is advised to continue Levothyroxine 150 mcg po daily before breakfast.    - The correct intake of thyroid hormone (Levothyroxine, Synthroid), is on empty stomach first thing in the morning, with water, separated by at least 30 minutes from breakfast and other medications,  and separated by more than 4 hours from calcium, iron, multivitamins, acid  reflux medications (PPIs).  - This medication is a life-long medication and will be needed to correct thyroid hormone imbalances for the rest of your life.  The dose may change from time to time, based on thyroid blood work.  - It is extremely important to be consistent taking this medication, near the same time each morning.  -AVOID TAKING PRODUCTS CONTAINING BIOTIN (commonly found in Hair, Skin, Nails vitamins) AS IT INTERFERES WITH THE VALIDITY OF THYROID FUNCTION BLOOD TESTS.  6) Chronic Care/Health Maintenance: -she is not on ACEI/ARB and is on Statin medications and is encouraged to initiate and continue to follow up with Ophthalmology, Dentist, Podiatrist at least yearly or according to recommendations, and advised to stay away from smoking. I have recommended yearly flu vaccine and pneumonia vaccine at least every 5 years; moderate intensity exercise for up to 150 minutes weekly; and sleep for at least 7 hours a day.  - she is advised to maintain close follow up with Marylynn Pearson, FNP for primary care needs, as well as her other providers for optimal and coordinated care.      I spent 38 minutes in the care of the patient today including review of labs from CMP, Lipids, Thyroid Function, Hematology (current and previous including abstractions from other  facilities); face-to-face time discussing  her blood glucose readings/logs, discussing hypoglycemia and hyperglycemia episodes and symptoms, medications doses, her options of short and long term treatment based on the latest standards of care / guidelines;  discussion about incorporating lifestyle medicine;  and documenting the encounter. Risk reduction counseling performed per USPSTF guidelines to reduce obesity and cardiovascular risk factors.     Please refer to Patient Instructions for Blood Glucose Monitoring and Insulin/Medications Dosing Guide"  in media tab for additional information. Please  also refer to " Patient Self  Inventory" in the Media  tab for reviewed elements of pertinent patient history.  Graycee A Kimple participated in the discussions, expressed understanding, and voiced agreement with the above plans.  All questions were answered to her satisfaction. she is encouraged to contact clinic should she have any questions or concerns prior to her return visit.     Follow up plan: - Return in about 4 months (around 10/18/2022) for Diabetes F/U with A1c in office, Previsit labs, Bring meter and logs, Thyroid follow up.   Alexandra Kelley, [facility] [address] [place], [place] [address] Phone: [phone] Fax: [phone]  06/18/2022, 3:32 PM

## 2022-06-18 NOTE — Patient Instructions (Signed)

## 2022-06-22 ENCOUNTER — Ambulatory Visit (HOSPITAL_COMMUNITY)
Admission: RE | Admit: 2022-06-22 | Discharge: 2022-06-22 | Disposition: A | Discharge status: Home / Self Care | Payer: Medicaid Other | Source: Ambulatory Visit | Attending: Urology | Admitting: Urology

## 2022-06-22 DIAGNOSIS — N200 Calculus of kidney: Secondary | ICD-10-CM | POA: Insufficient documentation

## 2022-06-25 ENCOUNTER — Encounter: Payer: Self-pay | Admitting: Urology

## 2022-06-25 ENCOUNTER — Ambulatory Visit: Payer: Medicaid Other | Admitting: Urology

## 2022-06-25 VITALS — BP 103/64 | HR 112

## 2022-06-25 DIAGNOSIS — Z87440 Personal history of urinary (tract) infections: Secondary | ICD-10-CM

## 2022-06-25 DIAGNOSIS — N200 Calculus of kidney: Secondary | ICD-10-CM | POA: Diagnosis not present

## 2022-06-25 LAB — URINALYSIS, ROUTINE W REFLEX MICROSCOPIC
Bilirubin, UA: NEGATIVE
Glucose, UA: NEGATIVE
Ketones, UA: NEGATIVE
Nitrite, UA: NEGATIVE
Protein,UA: NEGATIVE
RBC, UA: NEGATIVE
Specific Gravity, UA: 1.02 (ref 1.005–1.030)
Urobilinogen, Ur: 0.2 mg/dL (ref 0.2–1.0)
pH, UA: 5.5 (ref 5.0–7.5)

## 2022-06-25 LAB — MICROSCOPIC EXAMINATION
RBC, Urine: NONE SEEN /hpf (ref 0–2)
Renal Epithel, UA: NONE SEEN /hpf

## 2022-06-25 NOTE — Progress Notes (Signed)
d Subjective:  1. Bilateral renal stones   2. Personal history of urinary infection    06/25/22: Alexandra Kelley returns today in f/u for her history of stones as noted below.  She is doing well without flank pain or hematuria.   There are stable stones on KUB with a 28mm stone that has the appearance of being in there proximal ureter but on the prior CT the lower pole is very medial and the stone in the lower pole on the CT overlies the spine in a similar location as today's KUB.  The renal US shows no hydro.    12/23/21: Alexandra Kelley returns today in f/u for history of stones.  She had a right ureteroscopy for multiple right renal stones on 07/11/20.  She had her stent removed on 07/16/20.   Her stones were calcium oxalate.  She had a post op CT in 10/21 and had multiple residual non-obstructing right renal and left renal stones but the stone burden was reduced.   At CT prior to this visit shows possible a small increase in stone burden or probably just a repositioning of the stone burden as she has more stones in the right renal pelvis and less in the RUP that on her prior scan.  She has done well without pain, fever or hematuria. Her UA has 1+ LE.  Her stones are primarily calcium oxalate.  She is trying to hydrate and avoid salt.     ROS:  ROS:  A complete review of systems was performed.  All systems are negative except for pertinent findings as noted.   ROS  Allergies  Allergen Reactions   5-Alpha Reductase Inhibitors    Canagliflozin Other (See Comments)    Severe yeast infections   Melatonin Other (See Comments)   Metformin Other (See Comments)   Metformin And Related     GI distress   Onglyza [Saxagliptin]     headaches   Statins Other (See Comments)   Zofran [Ondansetron Hcl] Other (See Comments)    Itchy rash in mouth   Cefaclor Rash    Rash   Penicillins Rash    Has patient had a PCN reaction causing immediate rash, facial/tongue/throat swelling, SOB or lightheadedness with hypotension:  No Has patient had a PCN reaction causing severe rash involving mucus membranes or skin necrosis: No Has patient had a PCN reaction that required hospitalization: No Has patient had a PCN reaction occurring within the last 10 years: Yes If all of the above answers are "NO", then may proceed with Cephalosporin use.    Pregabalin Itching and Rash   Sitagliptin Rash    Outpatient Encounter Medications as of 06/25/2022  Medication Sig   amitriptyline (ELAVIL) 25 MG tablet Take 25 mg by mouth at bedtime.   BD PEN NEEDLE NANO 2ND GEN 32G X 4 MM MISC Use as directed to inject insulin twice daily   Continuous Blood Gluc Sensor (DEXCOM G7 SENSOR) MISC Inject 1 Application into the skin as directed. Change sensor every 10 days as directed.   dicyclomine (BENTYL) 10 MG capsule Take 1 capsule (10 mg total) by mouth 3 (three) times daily as needed for spasms. (Patient taking differently: Take 10 mg by mouth every 8 (eight) hours as needed (stomach spasms.).)   gabapentin (NEURONTIN) 300 MG capsule Take 300 mg by mouth 3 (three) times daily.   glucose blood (ACCU-CHEK AVIVA PLUS) test strip USE TO TEST BLOOD SUGAR TWICE A DAY AS DIRECTED BY DOCTOR 90   glucose blood  test strip See admin instructions.   hydrOXYzine (ATARAX/VISTARIL) 25 MG tablet Take 25 mg by mouth 2 (two) times daily as needed for itching or anxiety.   insulin aspart protamine - aspart (NOVOLOG MIX 70/30 FLEXPEN) (70-30) 100 UNIT/ML FlexPen Inject 30 Units into the skin 2 (two) times daily with a meal. 30 units with breakfast and 30 units with supper   levothyroxine (SYNTHROID) 150 MCG tablet Take 1 tablet (150 mcg total) by mouth daily before breakfast.   nystatin cream (MYCOSTATIN) APPLY TO AFFECTED AREA TWICE A DAY   pantoprazole (PROTONIX) 40 MG tablet TAKE 1 TABLET BY MOUTH DAILY BEFORE BREAKFAST   rosuvastatin (CRESTOR) 40 MG tablet Take 40 mg by mouth daily.   silver sulfADIAZINE (SILVADENE) 1 % cream Apply 1 application topically  daily. (Patient taking differently: Apply 1 application. topically daily as needed (wound care).)   traMADol (ULTRAM) 50 MG tablet TAKE 1 TABLET BY MOUTH EVERY 12 HOURS AS NEEDED (Patient taking differently: Take 50 mg by mouth 2 (two) times daily.)   No facility-administered encounter medications on file as of 06/25/2022.    Past Medical History:  Diagnosis Date   Arthritis    back   Carpal tunnel syndrome    both wrists   Diabetes mellitus without complication ([place])    Dr. Bubba Camp   GERD (gastroesophageal reflux disease)    History of kidney stones    Hyperlipidemia    Hypothyroidism    Kidney stone    Neuropathy    feet and hands   Thyroid disease     Past Surgical History:  Procedure Laterality Date   BACK SURGERY  1990s   CYSTOSCOPY/URETEROSCOPY/HOLMIUM LASER/STENT PLACEMENT Right 06/20/2020   Procedure: CYSTOSCOPY, RIGHT RETROGRADE /STENT PLACEMENT;  Surgeon: Irine Seal, MD;  Location: [facility];  Service: Urology;  Laterality: Right;   CYSTOSCOPY/URETEROSCOPY/HOLMIUM LASER/STENT PLACEMENT Right 07/11/2020   Procedure: CYSTOSCOPY RIGHT URETEROSCOPY/HOLMIUM LASER/STENT EXCHANGE;  Surgeon: Irine Seal, MD;  Location: [facility];  Service: Urology;  Laterality: Right;   FEMUR IM NAIL Left 10/03/2013   Procedure: INTRAMEDULLARY (IM) RETROGRADE FEMORAL NAILING;  Surgeon: Marybelle Killings, MD;  Location: [facility];  Service: Orthopedics;  Laterality: Left;   tubes in ears      Social History   Socioeconomic History   Marital status: Single    Spouse name: Not on file   Number of children: Not on file   Years of education: 12   Highest education level: Not on file  Occupational History   Not on file  Tobacco Use   Smoking status: Never   Smokeless tobacco: Never  Vaping Use   Vaping Use: Never used  Substance and Sexual Activity   Alcohol use: No   Drug use: No   Sexual activity: Never    Birth control/protection: None  Other Topics Concern   Not on file  Social History Narrative    Patient lives at home with her parents [place] and 40.   Patient has a high school certificate.   Right handed.   Caffeine coke cola sometimes.            Social Determinants of Health   Financial Resource Strain: Not on file  Food Insecurity: Not on file  Transportation Needs: Not on file  Physical Activity: Not on file  Stress: Not on file  Social Connections: Not on file  Intimate Partner Violence: Not on file    Family History  Problem Relation Age of Onset   Thyroid disease Mother  Hyperlipidemia Mother    Kidney Stones Father    Thyroid disease Maternal Grandmother    Other Maternal Grandmother        heart issue   Heart attack Maternal Grandfather    Diabetes Maternal Grandfather    Colon cancer Maternal Grandfather    Cancer Maternal Grandfather        prostate   Heart attack Paternal Grandmother        Objective: Vitals:   06/25/22 1548  BP: 103/64  Pulse: (!) 112     Physical Exam  Lab Results:  No results found for this or any previous visit (from the past 24 hour(s)).    BMET No results for input(s): "NA", "K", "CL", "CO2", "GLUCOSE", "BUN", "CREATININE", "CALCIUM" in the last 72 hours. PSA No results found for: "PSA" No results found for: "TESTOSTERONE"    Studies/Results: DG Abd 1 View  Result Date: 06/23/2022 CLINICAL DATA:  History of bilateral renal calculi. EXAM: ABDOMEN - 1 VIEW COMPARISON:  CT 12/18/2021 FINDINGS: Multiple stones project over the mid and lower right kidney. A 9 mm stone may be in the region of the right proximal ureter. Lesser stone burden seen in the left kidney with a single 3 mm stone in the region of the mid lower kidney. Detailed assessment limited by habitus. Calcifications projecting over the pelvis or in the medial gluteal musculature on prior CT. Nonobstructive bowel gas pattern. IMPRESSION: Bilateral renal stones. Possible 9 mm stone in the region of the right proximal ureter. Electronically Signed    By: Narda Rutherford M.D.   On: 06/23/2022 23:17   US RENAL  Result Date: 06/23/2022 CLINICAL DATA:  Bilateral renal calculi. EXAM: RENAL / URINARY TRACT ULTRASOUND COMPLETE COMPARISON:  Renal ultrasound August 14, 2021 FINDINGS: Right Kidney: Renal measurements: 13.1 x 6.1 x 6.6 cm = volume: 273 mL. There is a 15 mm stone in the lower pole the right kidney. No hydronephrosis. Left Kidney: Renal measurements: 11.7 x 6.0 x 6.2 cm = volume: 229 mL. Echogenicity within normal limits. No mass or hydronephrosis visualized. Bladder: Not visualized due to lack of distention. Other: None. IMPRESSION: 1. There is a 15 mm nonobstructive stone in the lower pole of the right kidney. No other stones identified in either kidney. The kidneys are otherwise unremarkable. 2. Bladder was not visualized due to lack of distention. Electronically Signed   By: Gerome Sam III M.D.   On: 06/23/2022 07:12       UA reviewed.  It is unremarkable.    Assessment & Plan: Right renal stones doing well s/p right URS.  KUB and renal US shows stable stones without obstruction.  She has been hydration and drinking lemon crystal light for the citrate load.  I will have her return in 6 months with a CT stone study.   History of UTI.  UA is unremarkable today.    No orders of the defined types were placed in this encounter.     Orders Placed This Encounter  Procedures   Microscopic Examination   CT RENAL STONE STUDY    Standing Status:   Future    Standing Expiration Date:   06/26/2023    Order Specific Question:   Preferred imaging location?    Answer:   [facility]    Order Specific Question:   Radiology Contrast Protocol - do NOT remove file path    Answer:   [email or web address]    Order Specific Question:   Is patient pregnant?  Answer:   No   Urinalysis, Routine w reflex microscopic      Return in about 6 months (around 12/26/2022) for with CT.   CC:  Denyce Robert, FNP  And Dr. Hildred Laser.    Irine Seal 06/26/2022 Patient ID: Earney Mallet, female   DOB: 03/30/1981, 41 y.o.   MRN: [id number]

## 2022-06-29 ENCOUNTER — Other Ambulatory Visit: Payer: Self-pay | Admitting: Nurse Practitioner

## 2022-06-29 ENCOUNTER — Telehealth: Payer: Self-pay | Admitting: Nurse Practitioner

## 2022-06-29 MED ORDER — DEXCOM G7 RECEIVER DEVI: 1.0000 | Freq: Once | 0 refills | Status: AC

## 2022-06-29 NOTE — Telephone Encounter (Signed)
Pt's needing a monitor for her Dexcom. She got her Sensor. Please Advise.

## 2022-06-29 NOTE — Telephone Encounter (Signed)
I just sent it

## 2022-08-17 ENCOUNTER — Telehealth: Payer: Self-pay

## 2022-08-17 NOTE — Telephone Encounter (Signed)
Patient called and advised they needed a refill on medication below. I looked in patients med list but did not see the medication listed. I advised patient that Dr. Jeffie Pollock was out of office until 10/31. Patient voiced understanding. Patient advised she has two pills left.    Medication: Hydrocodone 5-325 mg   Pharmacy: [id number] - [place], [place]   Thank you

## 2022-08-17 NOTE — Telephone Encounter (Signed)
Please see message below

## 2022-09-01 NOTE — Telephone Encounter (Signed)
FYI I returned call and spoke to pt's mother Tammy who is her POA.  She states she was not aware the patient was having increased pain and did not know that she had reached out to the office for the rx.  Her mother states she has to monitor the patient when taking pain meds and she did not feel that imagaing was necessary at this time.  Patient's mother will speak to patient and contact the office if they decide to proceed with imaging.

## 2022-09-29 ENCOUNTER — Other Ambulatory Visit: Payer: Self-pay | Admitting: Adult Health

## 2022-10-19 ENCOUNTER — Ambulatory Visit: Payer: Medicaid Other | Admitting: Nurse Practitioner

## 2022-10-28 ENCOUNTER — Other Ambulatory Visit: Payer: Self-pay | Admitting: Nurse Practitioner

## 2022-10-28 NOTE — Patient Instructions (Incomplete)

## 2022-10-29 ENCOUNTER — Ambulatory Visit: Payer: Medicaid Other | Admitting: Nurse Practitioner

## 2022-11-03 ENCOUNTER — Other Ambulatory Visit: Payer: Self-pay | Admitting: *Deleted

## 2022-11-03 ENCOUNTER — Telehealth: Payer: Self-pay | Admitting: Nurse Practitioner

## 2022-11-03 DIAGNOSIS — E119 Type 2 diabetes mellitus without complications: Secondary | ICD-10-CM

## 2022-11-03 MED ORDER — BD PEN NEEDLE NANO 2ND GEN 32G X 4 MM MISC: 2 refills | Status: DC

## 2022-11-03 NOTE — Telephone Encounter (Signed)
A REFILL WAS SENT TO THE PATIENT'S PHARMACY.

## 2022-11-03 NOTE — Telephone Encounter (Signed)
New message      1. Which medications need to be refilled? (please list name of each medication and dose if known) BD PEN NEEDLE NANO 2ND GEN 32G X 4 MM MISC   2. Which pharmacy/location (including street and city if local pharmacy) is medication [facility] sent to[facility] [place]   3. Do they need a 30 day or 90 day supply? 90 day supply

## 2022-11-05 ENCOUNTER — Ambulatory Visit: Payer: Medicaid Other | Admitting: Nurse Practitioner

## 2022-11-17 LAB — TSH: TSH: 0.679 u[IU]/mL (ref 0.450–4.500)

## 2022-11-17 LAB — T4, FREE: Free T4: 1.26 ng/dL (ref 0.82–1.77)

## 2022-11-26 ENCOUNTER — Ambulatory Visit: Payer: Medicaid Other | Admitting: Nurse Practitioner

## 2022-11-26 ENCOUNTER — Encounter: Payer: Self-pay | Admitting: Nurse Practitioner

## 2022-11-26 VITALS — BP 104/52 | HR 98 | Ht <= 58 in | Wt 300.6 lb

## 2022-11-26 DIAGNOSIS — E119 Type 2 diabetes mellitus without complications: Secondary | ICD-10-CM | POA: Diagnosis not present

## 2022-11-26 DIAGNOSIS — Z794 Long term (current) use of insulin: Secondary | ICD-10-CM

## 2022-11-26 DIAGNOSIS — E039 Hypothyroidism, unspecified: Secondary | ICD-10-CM

## 2022-11-26 LAB — POCT GLYCOSYLATED HEMOGLOBIN (HGB A1C): Hemoglobin A1C: 7.7 % — AB (ref 4.0–5.6)

## 2022-11-26 MED ORDER — LEVOTHYROXINE SODIUM 150 MCG PO TABS: 150.0000 ug | ORAL_TABLET | Freq: Every day | ORAL | 1 refills | Status: DC

## 2022-11-26 MED ORDER — NOVOLOG MIX 70/30 FLEXPEN (70-30) 100 UNIT/ML ~~LOC~~ SUPN
30.0000 [IU] | PEN_INJECTOR | Freq: Two times a day (BID) | SUBCUTANEOUS | 3 refills | Status: AC
Start: 2022-11-26 — End: ?

## 2022-11-26 MED ORDER — DEXCOM G7 SENSOR MISC
1.0000 | 3 refills | Status: AC
Start: 2022-11-26 — End: ?

## 2022-11-26 MED ORDER — VITAMIN D (ERGOCALCIFEROL) 1.25 MG (50000 UNIT) PO CAPS: 50000.0000 [IU] | ORAL_CAPSULE | ORAL | 2 refills | Status: DC

## 2022-11-26 NOTE — Progress Notes (Signed)
Endocrinology Follow Up Note       11/26/2022, 4:25 PM   Subjective:    Patient ID: Alexandra Kelley, female    DOB: [birth year].  Alexandra Kelley is being seen in follow up after being seen in consultation for management of currently uncontrolled symptomatic diabetes and hypothyroidism requested by  Marylynn Pearson, FNP.   Past Medical History:  Diagnosis Date   Arthritis    back   Carpal tunnel syndrome    both wrists   Diabetes mellitus without complication (HCC)    Dr. Lurene Shadow   GERD (gastroesophageal reflux disease)    History of kidney stones    Hyperlipidemia    Hypothyroidism    Kidney stone    Neuropathy    feet and hands   Thyroid disease     Past Surgical History:  Procedure Laterality Date   BACK SURGERY  1990s   CYSTOSCOPY/URETEROSCOPY/HOLMIUM LASER/STENT PLACEMENT Right 06/20/2020   Procedure: CYSTOSCOPY, RIGHT RETROGRADE /STENT PLACEMENT;  Surgeon: Bjorn Pippin, MD;  Location: [facility];  Service: Urology;  Laterality: Right;   CYSTOSCOPY/URETEROSCOPY/HOLMIUM LASER/STENT PLACEMENT Right 07/11/2020   Procedure: CYSTOSCOPY RIGHT URETEROSCOPY/HOLMIUM LASER/STENT EXCHANGE;  Surgeon: Bjorn Pippin, MD;  Location: [facility];  Service: Urology;  Laterality: Right;   FEMUR IM NAIL Left 10/03/2013   Procedure: INTRAMEDULLARY (IM) RETROGRADE FEMORAL NAILING;  Surgeon: Eldred Manges, MD;  Location: [facility];  Service: Orthopedics;  Laterality: Left;   tubes in ears      Social History   Socioeconomic History   Marital status: Single    Spouse name: Not on file   Number of children: Not on file   Years of education: 12   Highest education level: Not on file  Occupational History   Not on file  Tobacco Use   Smoking status: Never   Smokeless tobacco: Never  Vaping Use   Vaping Use: Never used  Substance and Sexual Activity   Alcohol use: No   Drug use: No   Sexual activity: Never    Birth  control/protection: None  Other Topics Concern   Not on file  Social History Narrative   Patient lives at home with her parents [place] and 73.   Patient has a high school certificate.   Right handed.   Caffeine coke cola sometimes.            Social Determinants of Health   Financial Resource Strain: Not on file  Food Insecurity: Not on file  Transportation Needs: Not on file  Physical Activity: Not on file  Stress: Not on file  Social Connections: Not on file    Family History  Problem Relation Age of Onset   Thyroid disease Mother    Hyperlipidemia Mother    Kidney Stones Father    Thyroid disease Maternal Grandmother    Other Maternal Grandmother        heart issue   Heart attack Maternal Grandfather    Diabetes Maternal Grandfather    Colon cancer Maternal Grandfather    Cancer Maternal Grandfather        prostate   Heart attack Paternal Grandmother     Outpatient Encounter Medications as of 11/26/2022  Medication Sig   amitriptyline (ELAVIL) 25 MG tablet Take 25 mg by mouth at bedtime.   BD PEN NEEDLE NANO 2ND GEN 32G X 4 MM MISC USE AS DIRECTED TO INJECT INSULIN TWICE DAILY   dicyclomine (BENTYL) 10 MG capsule Take 1 capsule (10 mg total) by mouth 3 (three) times daily as needed for spasms. (Patient taking differently: Take 10 mg by mouth every 8 (eight) hours as needed (stomach spasms.).)   gabapentin (NEURONTIN) 300 MG capsule Take 300 mg by mouth 3 (three) times daily.   glucose blood (ACCU-CHEK AVIVA PLUS) test strip USE TO TEST BLOOD SUGAR TWICE A DAY AS DIRECTED BY DOCTOR 90   glucose blood test strip See admin instructions.   hydrOXYzine (ATARAX/VISTARIL) 25 MG tablet Take 25 mg by mouth 2 (two) times daily as needed for itching or anxiety.   nystatin cream (MYCOSTATIN) APPLY TO AFFECTED AREA TWICE A DAY   pantoprazole (PROTONIX) 40 MG tablet TAKE 1 TABLET BY MOUTH DAILY BEFORE BREAKFAST   traMADol (ULTRAM) 50 MG tablet TAKE 1 TABLET BY MOUTH EVERY 12  HOURS AS NEEDED (Patient taking differently: Take 50 mg by mouth 2 (two) times daily.)   Vitamin D, Ergocalciferol, (DRISDOL) 1.25 MG (50000 UNIT) CAPS capsule Take 1 capsule (50,000 Units total) by mouth every 7 (seven) days.   [DISCONTINUED] insulin aspart protamine - aspart (NOVOLOG MIX 70/30 FLEXPEN) (70-30) 100 UNIT/ML FlexPen Inject 30 Units into the skin 2 (two) times daily with a meal. 30 units with breakfast and 30 units with supper   [DISCONTINUED] levothyroxine (SYNTHROID) 150 MCG tablet Take 1 tablet (150 mcg total) by mouth daily before breakfast.   [DISCONTINUED] silver sulfADIAZINE (SILVADENE) 1 % cream Apply 1 application topically daily. (Patient taking differently: Apply 1 application  topically daily as needed (wound care).)   Continuous Blood Gluc Sensor (DEXCOM G7 SENSOR) MISC Inject 1 Application into the skin as directed. Change sensor every 10 days as directed.   insulin aspart protamine - aspart (NOVOLOG MIX 70/30 FLEXPEN) (70-30) 100 UNIT/ML FlexPen Inject 30 Units into the skin 2 (two) times daily with a meal. 30 units with breakfast and 30 units with supper   levothyroxine (SYNTHROID) 150 MCG tablet Take 1 tablet (150 mcg total) by mouth daily before breakfast.   [DISCONTINUED] Continuous Blood Gluc Sensor (DEXCOM G7 SENSOR) MISC Inject 1 Application into the skin as directed. Change sensor every 10 days as directed. (Patient not taking: Reported on 11/26/2022)   [DISCONTINUED] rosuvastatin (CRESTOR) 40 MG tablet Take 40 mg by mouth daily. (Patient not taking: Reported on 11/26/2022)   No facility-administered encounter medications on file as of 11/26/2022.    ALLERGIES: Allergies  Allergen Reactions   5-Alpha Reductase Inhibitors    Canagliflozin Other (See Comments)    Severe yeast infections   Melatonin Other (See Comments)   Metformin Other (See Comments)   Metformin And Related     GI distress   Onglyza [Saxagliptin]     headaches   Statins Other (See Comments)    Zofran [Ondansetron Hcl] Other (See Comments)    Itchy rash in mouth   Cefaclor Rash    Rash   Penicillins Rash    Has patient had a PCN reaction causing immediate rash, facial/tongue/throat swelling, SOB or lightheadedness with hypotension: No Has patient had a PCN reaction causing severe rash involving mucus membranes or skin necrosis: No Has patient had a PCN reaction that required hospitalization: No Has patient had a PCN reaction occurring within the  last 10 years: Yes If all of the above answers are "NO", then may proceed with Cephalosporin use.    Pregabalin Itching and Rash   Sitagliptin Rash    VACCINATION STATUS: There is no immunization history for the selected administration types on file for this patient.  Diabetes She presents for her follow-up diabetic visit. She has type 2 diabetes mellitus. Onset time: diagnosed at approx age of 67. Her disease course has been stable. There are no hypoglycemic associated symptoms. Pertinent negatives for hypoglycemia include no nervousness/anxiousness or tremors. There are no diabetic associated symptoms. Pertinent negatives for diabetes include no fatigue. There are no hypoglycemic complications. Symptoms are stable. Diabetic complications include peripheral neuropathy. Risk factors for coronary artery disease include diabetes mellitus and obesity. Current diabetic treatment includes insulin injections. She is compliant with treatment most of the time. Her weight is fluctuating minimally. She is following a generally healthy diet. When asked about meal planning, she reported none. She has not had a previous visit with a dietitian. She never participates in exercise. Her home blood glucose trend is fluctuating minimally. Her overall blood glucose range is 140-180 mg/dl. (She presents today, accompanied by her mother, with her meter, no logs, showing stable, slightly above target glycemic profile overall.  Her POCT A1c today is 7.7%, essentially  unchanged from previous visit.  Her insurance would not pay for CGM receiver, therefore she did not pick up the prescription.  She denies any significant hypoglycemia.) An ACE inhibitor/angiotensin II receptor blocker is not being taken. She sees a podiatrist.Eye exam is current.  Hyperlipidemia This is a chronic problem. The current episode started more than 1 year ago. Exacerbating diseases include diabetes, hypothyroidism and obesity. Factors aggravating her hyperlipidemia include fatty foods. Current antihyperlipidemic treatment includes statins. Compliance problems include adherence to diet and adherence to exercise.  Risk factors for coronary artery disease include diabetes mellitus, dyslipidemia, family history, obesity and a sedentary lifestyle.  Thyroid Problem Presents for initial visit. Symptoms include weight gain. Patient reports no anxiety, cold intolerance, constipation, depressed mood, fatigue, hair loss, heat intolerance, leg swelling, palpitations or tremors. Past treatments include levothyroxine. The treatment provided moderate relief. Her past medical history is significant for diabetes, hyperlipidemia and obesity. Risk factors include family history of hypothyroidism.    Review of systems  Constitutional: + Minimally fluctuating body weight,  current Body mass index is 67.39 kg/m. , no fatigue, no subjective hyperthermia, no subjective hypothermia Eyes: no blurry vision, no xerophthalmia ENT: no sore throat, no nodules palpated in throat, no dysphagia/odynophagia, no hoarseness Cardiovascular: no chest pain, no shortness of breath, no palpitations, no leg swelling Respiratory: no cough, no shortness of breath Gastrointestinal: no nausea/vomiting/diarrhea Musculoskeletal: no muscle/joint aches Skin: no rashes, no hyperemia Neurological: no tremors, no numbness, no tingling, no dizziness Psychiatric: no depression, no anxiety  Objective:     BP (!) 104/52 (BP Location: Left  Arm, Patient Position: Sitting, Cuff Size: Normal)   Pulse 98   Ht 4\' 8"  (1.422 m)   Wt (!) 300 lb 9.6 oz (136.4 kg)   BMI 67.39 kg/m   Wt Readings from Last 3 Encounters:  11/26/22 (!) 300 lb 9.6 oz (136.4 kg)  06/18/22 (!) 307 lb (139.3 kg)  01/15/22 298 lb (135.2 kg)     BP Readings from Last 3 Encounters:  11/26/22 (!) 104/52  06/25/22 103/64  06/18/22 137/79      Physical Exam- Limited  Constitutional:  Body mass index is 67.39 kg/m. , not in  acute distress, normal state of mind Eyes:  EOMI, no exophthalmos Musculoskeletal: no gross deformities, strength intact in all four extremities, no gross restriction of joint movements Skin:  no rashes, no hyperemia Neurological: no tremor with outstretched hands    Diabetic Foot Exam - Simple   Simple Foot Form Visual Inspection See comments: Yes Sensation Testing Intact to touch and monofilament testing bilaterally: Yes Pulse Check Posterior Tibialis and Dorsalis pulse intact bilaterally: Yes Comments Dry flaky skin bilaterally     CMP ( most recent) CMP     Component Value Date/Time   NA 138 07/03/2020 1438   K 4.4 07/03/2020 1438   CL 97 (L) 07/03/2020 1438   CO2 29 07/03/2020 1438   GLUCOSE 170 (H) 07/03/2020 1438   BUN 9 07/03/2020 1438   CREATININE 0.72 07/03/2020 1438   CALCIUM 9.6 07/03/2020 1438   PROT 6.8 10/18/2018 0458   ALBUMIN 3.2 (L) 10/18/2018 0458   AST 18 10/18/2018 0458   ALT 19 10/18/2018 0458   ALKPHOS 56 10/18/2018 0458   BILITOT 0.9 10/18/2018 0458   GFRNONAA >60 07/03/2020 1438   GFRAA >60 07/03/2020 1438     Diabetic Labs (most recent): Lab Results  Component Value Date   HGBA1C 7.7 (A) 11/26/2022   HGBA1C 7.6 (A) 06/18/2022   HGBA1C 7.9 (A) 01/15/2022     Lipid Panel ( most recent) Lipid Panel  No results found for: "CHOL", "TRIG", "HDL", "CHOLHDL", "VLDL", "LDLCALC", "LDLDIRECT", "LABVLDL"    Lab Results  Component Value Date   TSH 0.679 11/16/2022   TSH 0.578  12/25/2021   TSH 0.033 (L) 09/04/2021   TSH 1.812 10/17/2018   TSH 5.154 (H) 10/03/2013   FREET4 1.26 11/16/2022   FREET4 1.32 12/25/2021   FREET4 1.68 09/04/2021           Assessment & Plan:   1) Uncontrolled type 2 diabetes with long term insulin therapy  She presents today, accompanied by her mother, with her meter, no logs, showing stable, slightly above target glycemic profile overall.  Her POCT A1c today is 7.7%, essentially unchanged from previous visit.  Her insurance would not pay for CGM receiver, therefore she did not pick up the prescription.  She denies any significant hypoglycemia.  - Alexandra Kelley has currently uncontrolled symptomatic type 2 DM since 42 years of age.   -Recent labs reviewed.  - I had a long discussion with her about the progressive nature of diabetes and the pathology behind its complications. -her diabetes is complicated by peripheral neuropathy and she remains at a high risk for more acute and chronic complications which include CAD, CVA, CKD, retinopathy, and neuropathy. These are all discussed in detail with her.  - Nutritional counseling repeated at each appointment due to patients tendency to fall back in to old habits.  - The patient admits there is a room for improvement in their diet and drink choices. -  Suggestion is made for the patient to avoid simple carbohydrates from their diet including Cakes, Sweet Desserts / Pastries, Ice Cream, Soda (diet and regular), Sweet Tea, Candies, Chips, Cookies, Sweet Pastries, Store Bought Juices, Alcohol in Excess of 1-2 drinks a day, Artificial Sweeteners, Coffee Creamer, and "Sugar-free" Products. This will help patient to have stable blood glucose profile and potentially avoid unintended weight gain.   - I encouraged the patient to switch to unprocessed or minimally processed complex starch and increased protein intake (animal or plant source), fruits, and vegetables.   - Patient  is advised to  stick to a routine mealtimes to eat 3 meals a day and avoid unnecessary snacks (to snack only to correct hypoglycemia).  - she will be scheduled with Norm SaltPenny Crumpton, RDN, CDE for diabetes education.  - I have approached her with the following individualized plan to manage her diabetes and patient agrees:   -She is advised to continue her premixed insulin 70/30  30 units with breakfast and 30 units with supper if glucose is above 90 and she is eating.    -she is encouraged to continue monitoring blood glucose 3 times daily, before injecting insulin and before bed, and to call the clinic if she has readings less than 70 or above 200 for 3 tests in a row.  She could benefit from CGM device.  I sent in Dexcom G7 to local  pharmacy but they would not cover receiver, thus I gave her a sample receiver today and resent sensors to her pharmacy.  I did help her get the CGM set up today and applied for the first time.  - she is warned not to take insulin without proper monitoring per orders. - Adjustment parameters are given to her for hypo and hyperglycemia in writing.  - she will be considered for incretin therapy as appropriate next visit.  - Specific targets for  A1c; LDL, HDL, and Triglycerides were discussed with the patient.  2) Blood Pressure /Hypertension:  her blood pressure is controlled to target without the use of antihypertensives.    3) Lipids/Hyperlipidemia:    Her recent lipid panel from 02/24/22 shows uncontrolled LDL of 127 and elevated triglycerides of 172.   she is advised to continue Crestor 40 mg daily at bedtime.  Side effects and precautions discussed with her.    4)  Weight/Diet:  her Body mass index is 67.39 kg/m.  -  clearly complicating her diabetes care.   she is a candidate for weight loss. I discussed with her the fact that loss of 5 - 10% of her  current body weight will have the most impact on her diabetes management.  Exercise, and detailed carbohydrates information  provided  -  detailed on discharge instructions.  5) Hypothyroidism-unspecified Her previsit TFTs are consistent with appropriate hormone replacement.   She is advised to continue Levothyroxine 150 mcg po daily before breakfast.    - The correct intake of thyroid hormone (Levothyroxine, Synthroid), is on empty stomach first thing in the morning, with water, separated by at least 30 minutes from breakfast and other medications,  and separated by more than 4 hours from calcium, iron, multivitamins, acid reflux medications (PPIs).  - This medication is a life-long medication and will be needed to correct thyroid hormone imbalances for the rest of your life.  The dose may change from time to time, based on thyroid blood work.  - It is extremely important to be consistent taking this medication, near the same time each morning.  -AVOID TAKING PRODUCTS CONTAINING BIOTIN (commonly found in Hair, Skin, Nails vitamins) AS IT INTERFERES WITH THE VALIDITY OF THYROID FUNCTION BLOOD TESTS.  6) Vitamin D deficiency Her recent vitamin d level from 11/16/22 was 15.  She could benefit from replenishment with Ergocalciferol 50000 units po weekly x 12 weeks.  I did give her additional refills as I think she will need it for prolonged period of time.  7) Chronic Care/Health Maintenance: -she is not on ACEI/ARB and is on Statin medications and is encouraged to initiate and continue to  follow up with Ophthalmology, Dentist, Podiatrist at least yearly or according to recommendations, and advised to stay away from smoking. I have recommended yearly flu vaccine and pneumonia vaccine at least every 5 years; moderate intensity exercise for up to 150 minutes weekly; and sleep for at least 7 hours a day.  - she is advised to maintain close follow up with Marylynn Pearson, FNP for primary care needs, as well as her other providers for optimal and coordinated care.      I spent 50 minutes in the care of the patient today  including review of labs from CMP, Lipids, Thyroid Function, Hematology (current and previous including abstractions from other facilities); face-to-face time discussing  her blood glucose readings/logs, discussing hypoglycemia and hyperglycemia episodes and symptoms, medications doses, her options of short and long term treatment based on the latest standards of care / guidelines;  discussion about incorporating lifestyle medicine;  and documenting the encounter. Risk reduction counseling performed per USPSTF guidelines to reduce obesity and cardiovascular risk factors.     Please refer to Patient Instructions for Blood Glucose Monitoring and Insulin/Medications Dosing Guide"  in media tab for additional information. Please  also refer to " Patient Self Inventory" in the Media  tab for reviewed elements of pertinent patient history.  Alexandra Kelley participated in the discussions, expressed understanding, and voiced agreement with the above plans.  All questions were answered to her satisfaction. she is encouraged to contact clinic should she have any questions or concerns prior to her return visit.     Follow up plan: - Return in about 4 months (around 03/27/2023) for Diabetes F/U with A1c in office, No previsit labs, Bring meter and logs.   Ronny Bacon, [facility] [address] [place], [place] [address] Phone: [phone] Fax: [phone]  11/26/2022, 4:25 PM

## 2022-12-25 ENCOUNTER — Ambulatory Visit (HOSPITAL_COMMUNITY)
Admission: RE | Admit: 2022-12-25 | Discharge: 2022-12-25 | Disposition: A | Discharge status: Home / Self Care | Payer: Medicaid Other | Source: Ambulatory Visit | Attending: Urology | Admitting: Urology

## 2022-12-25 DIAGNOSIS — N200 Calculus of kidney: Secondary | ICD-10-CM | POA: Insufficient documentation

## 2022-12-28 ENCOUNTER — Other Ambulatory Visit (HOSPITAL_COMMUNITY): Payer: Self-pay

## 2022-12-28 ENCOUNTER — Telehealth: Payer: Self-pay | Admitting: *Deleted

## 2022-12-28 ENCOUNTER — Telehealth: Payer: Self-pay | Admitting: Pharmacy Technician

## 2022-12-28 NOTE — Telephone Encounter (Signed)
Pharmacy Patient Advocate Encounter   Received notification from Pt calls msgs/LPN that prior authorization for Dexcom G7 is required/requested.   PA submitted on 12/28/22 to (ins) [place] [employer] via CoverMyMeds Key: [id number]  Prior Authorization has been approved.    PA# PA Case ID: [id number] Effective dates: 12/28/22 through 06/26/23  Spoke with Pharmacy to process. $0 copay

## 2022-12-28 NOTE — Telephone Encounter (Signed)
Patient's mother called . She states that she had went by the pharmacy a few times over the weekend to pick up the sensors. Patient had MRI and had to remove the sensor she had on , so she has been without The pharmacy told her that they were waiting on a PA to be completed.  Patient's mother advise that we would follow up with the Rx PA team for a status.

## 2022-12-29 ENCOUNTER — Telehealth: Payer: Self-pay

## 2022-12-29 NOTE — Telephone Encounter (Signed)
Thank you so much Adrienne for all you do to help [place]. You are appreciated.

## 2022-12-29 NOTE — Telephone Encounter (Signed)
Patient's mother aware of MD response to CT.  She states pt had been seen by Dr. Constance Haw and is being monitored for the hernia.  Mother states that pt had experienced stomach spasms and diarrhea, she is doing better at the moment.  I did inform her to reach out to Dr. Constance Haw and I would relay this information to Dr. Jeffie Pollock.  Message sent to MD informing him.

## 2022-12-29 NOTE — Telephone Encounter (Signed)
I have called the patient's mother and made her aware that the PA had been approved.

## 2022-12-29 NOTE — Telephone Encounter (Signed)
-----   Message from Irine Seal, MD sent at 12/29/2022  7:32 AM EST ----- Her renal stones are stable and non-obstructing.   There is a concern for a possible partial small bowel obstruction associated with a lower abdominal wall ventral hernia.   Can you ask if she has ever seen general surgery for the hernia?   If she has not,it would probably be worthwhile for [place] to get her referred to one of the surgeons just to see if this is something that needs to be watched more closely or repaired.  ----- Message ----- From: Audie Box, CMA Sent: 12/28/2022   8:47 AM EST To: Irine Seal, MD  F/u apt 03/07

## 2022-12-31 ENCOUNTER — Other Ambulatory Visit: Payer: Self-pay | Admitting: *Deleted

## 2022-12-31 ENCOUNTER — Encounter: Payer: Self-pay | Admitting: General Surgery

## 2022-12-31 ENCOUNTER — Ambulatory Visit: Payer: Medicaid Other | Admitting: General Surgery

## 2022-12-31 VITALS — BP 123/71 | HR 85 | Temp 98.2°F | Resp 16 | Ht <= 58 in | Wt 303.0 lb

## 2022-12-31 DIAGNOSIS — K439 Ventral hernia without obstruction or gangrene: Secondary | ICD-10-CM | POA: Insufficient documentation

## 2022-12-31 DIAGNOSIS — K529 Noninfective gastroenteritis and colitis, unspecified: Secondary | ICD-10-CM

## 2022-12-31 DIAGNOSIS — R197 Diarrhea, unspecified: Secondary | ICD-10-CM | POA: Diagnosis not present

## 2022-12-31 DIAGNOSIS — K582 Mixed irritable bowel syndrome: Secondary | ICD-10-CM

## 2022-12-31 NOTE — Patient Instructions (Addendum)
No clinical signs of bowel obstruction.  Known Ventral hernia, no signs of incarceration.  Diarrhea of unknown etiology. Will refer you to Gastroenterology.

## 2023-01-01 NOTE — Progress Notes (Unsigned)
[facility] Note   HPI:  42 y.o. Female presents to clinic for evaluation after having a renal protocol CT to check on her kidney stones and having concern for possible partial small bowel obstruction with her large ventral hernia.  The patient is here today with her mother.   The day she had the scan the patient did have some abdominal pain but started to have diarrhea and has has diarrhea since that time.  She has a diagnosis of IBS and takes bentyl as needed.  She has been eating normal and having no obstructive symptoms.   No one has been sick around her.    Review of Systems:  No further abdominal pain + diarrhea  All other review of systems: otherwise negative   Vital Signs:  BP 123/71   Pulse 85   Temp 98.2 F (36.8 C) (Oral)   Resp 16   Ht '4\' 8"'$  (1.422 m)   Wt (!) 303 lb (137.4 kg)   SpO2 92%   BMI 67.93 kg/m    Physical Exam:  Physical Exam Constitutional:      Appearance: She is obese.  HENT:     Head: Normocephalic.  Abdominal:     General: There is no distension.     Palpations: Abdomen is soft.     Tenderness: There is no abdominal tenderness.     Hernia: A hernia is present.     Comments: Large pannus and abdominal girth, ventral hernia at the umbilical area that is hanging inferior due to pannus, large 8+ cm defect with reducible bowel, mildly tender with palpation, thin skin over the hernia   Neurological:     Mental Status: Mental status is at baseline.      Imaging: reviewed the imaging with radiology and this is not a small supraumbilical hernia, this is a large almost loss of domain in the pannus with the defect measuring 7.5cm+  CLINICAL DATA:  Nephrolithiasis.   EXAM: CT ABDOMEN AND PELVIS WITHOUT CONTRAST   TECHNIQUE: Multidetector CT imaging of the abdomen and pelvis was performed following the standard protocol without IV contrast.   RADIATION DOSE REDUCTION: This exam was performed according to the departmental  dose-optimization program which includes automated exposure control, adjustment of the mA and/or kV according to patient size and/or use of iterative reconstruction technique.   COMPARISON:  12/18/2021   FINDINGS: Lower chest: No acute findings.   Hepatobiliary: No mass visualized on this unenhanced exam. Gallbladder is unremarkable. No evidence of biliary ductal dilatation.   Pancreas: No mass or inflammatory process visualized on this unenhanced exam.   Spleen:  Within normal limits in size.   Adrenals/Urinary tract: Multiple bilateral renal calculi are again seen, largest in lower pole of right kidney measuring 13 mm. A few tiny calculi are again seen in the right renal pelvis. No evidence of ureteral calculi or hydronephrosis. Unremarkable unopacified urinary bladder.   Stomach/Bowel: Mildly dilated small bowel loops are seen containing air-fluid levels. Transition point and small bowel feces sign are seen involving distal small bowel loops, several located in a small supraumbilical ventral hernia. This is suspicious for a low-grade small bowel obstruction. No evidence of bowel wall thickening or pneumatosis. No other focal inflammatory process or abnormal fluid collections identified. Normal appendix visualized.   Vascular/Lymphatic: No pathologically enlarged lymph nodes identified. No evidence of abdominal aortic aneurysm. Aortic atherosclerotic calcification incidentally noted.   Reproductive:  No mass or other significant abnormality.   Other:  None.  Musculoskeletal:  No suspicious bone lesions identified.   IMPRESSION: Bilateral nephrolithiasis. No evidence of ureteral calculi or hydronephrosis.   Findings suspicious for low-grade distal small bowel obstruction, due to small supraumbilical ventral hernia.     Electronically Signed   By: Marlaine Hind M.D.   On: 12/25/2022 18:39   Assessment:  42 y.o. yo Female with a large ventral hernia that is reducible. She is very  overweight and has a large pannus that pulls on the area. This is only going to get worse with time. She needs to lose weight. Discussed with patient's mother that I do not think any hernia specialist would do a hernia repair on patient electively given her weight and co-morbidities. She is not clinically obstructed..  Plan:  No clinical signs of bowel obstruction.  Known Ventral hernia, no signs of incarceration.  Diarrhea of unknown etiology. Will refer you to Gastroenterology.   All of the above recommendations were discussed with the patient and patient's family, and all of patient's and family's questions were answered to their expressed satisfaction.  PRN follow up If she does need any ventral hernia repair, will need tertiary care referral. She could attempt to lose weight to help with the problem worsening in the future.     Curlene Labrum, MD [facility] [address] [place], [place] [address] [phone] (office)

## 2023-01-07 ENCOUNTER — Encounter: Payer: Self-pay | Admitting: Urology

## 2023-01-07 ENCOUNTER — Ambulatory Visit (INDEPENDENT_AMBULATORY_CARE_PROVIDER_SITE_OTHER): Payer: Medicaid Other | Admitting: Urology

## 2023-01-07 VITALS — BP 101/60 | HR 98 | Ht <= 58 in | Wt 303.0 lb

## 2023-01-07 DIAGNOSIS — R8271 Bacteriuria: Secondary | ICD-10-CM | POA: Diagnosis not present

## 2023-01-07 DIAGNOSIS — N200 Calculus of kidney: Secondary | ICD-10-CM | POA: Diagnosis not present

## 2023-01-07 DIAGNOSIS — Z87440 Personal history of urinary (tract) infections: Secondary | ICD-10-CM | POA: Diagnosis not present

## 2023-01-07 NOTE — Progress Notes (Signed)
d Subjective:  1. Bilateral renal stones   2. Personal history of urinary infection   3. Bacteriuria    01/07/23: Alexandra Kelley returns today in f/u.  She had a CT in February and had stable non-obstructing renal stones, right > left.  She had some increased diameter of the small bowel and there was a question of a partial SBO but she has seen Dr. Constance Haw and she didn't think that was what was going on.   She is voiding well and she has no flank pain or hematuria.    06/25/22: Alexandra Kelley returns today in f/u for her history of stones as noted below.  She is doing well without flank pain or hematuria.   There are stable stones on KUB with a 62m stone that has the appearance of being in there proximal ureter but on the prior CT the lower pole is very medial and the stone in the lower pole on the CT overlies the spine in a similar location as today's KUB.  The renal UKoreashows no hydro.    12/23/21: Alexandra Kelley today in f/u for history of stones.  She had a right ureteroscopy for multiple right renal stones on 07/11/20.  She had her stent removed on 07/16/20.   Her stones were calcium oxalate.  She had a post op CT in 10/21 and had multiple residual non-obstructing right renal and left renal stones but the stone burden was reduced.   At CT prior to this visit shows possible a small increase in stone burden or probably just a repositioning of the stone burden as she has more stones in the right renal pelvis and less in the RUP that on her prior scan.  She has done well without pain, fever or hematuria. Her UA has 1+ LE.  Her stones are primarily calcium oxalate.  She is trying to hydrate and avoid salt.     ROS:  ROS:  A complete review of systems was performed.  All systems are negative except for pertinent findings as noted.   ROS  Allergies  Allergen Reactions   5-Alpha Reductase Inhibitors    Canagliflozin Other (See Comments)    Severe yeast infections   Melatonin Other (See Comments)   Metformin Other  (See Comments)   Metformin And Related     GI distress   Onglyza [Saxagliptin]     headaches   Statins Other (See Comments)   Zofran [Ondansetron Hcl] Other (See Comments)    Itchy rash in mouth   Cefaclor Rash    Rash   Penicillins Rash    Has patient had a PCN reaction causing immediate rash, facial/tongue/throat swelling, SOB or lightheadedness with hypotension: No Has patient had a PCN reaction causing severe rash involving mucus membranes or skin necrosis: No Has patient had a PCN reaction that required hospitalization: No Has patient had a PCN reaction occurring within the last 10 years: Yes If all of the above answers are "NO", then may proceed with Cephalosporin use.    Pregabalin Itching and Rash   Sitagliptin Rash    Outpatient Encounter Medications as of 01/07/2023  Medication Sig   amitriptyline (ELAVIL) 25 MG tablet Take 25 mg by mouth at bedtime.   BD PEN NEEDLE NANO 2ND GEN 32G X 4 MM MISC USE AS DIRECTED TO INJECT INSULIN TWICE DAILY   Continuous Blood Gluc Sensor (DEXCOM G7 SENSOR) MISC Inject 1 Application into the skin as directed. Change sensor every 10 days as directed.   dicyclomine (  BENTYL) 10 MG capsule Take 1 capsule (10 mg total) by mouth 3 (three) times daily as needed for spasms. (Patient taking differently: Take 10 mg by mouth every 8 (eight) hours as needed (stomach spasms.).)   gabapentin (NEURONTIN) 300 MG capsule Take 300 mg by mouth 3 (three) times daily.   glucose blood (ACCU-CHEK AVIVA PLUS) test strip USE TO TEST BLOOD SUGAR TWICE A DAY AS DIRECTED BY DOCTOR 90   glucose blood test strip See admin instructions.   hydrOXYzine (ATARAX/VISTARIL) 25 MG tablet Take 25 mg by mouth 2 (two) times daily as needed for itching or anxiety.   insulin aspart protamine - aspart (NOVOLOG MIX 70/30 FLEXPEN) (70-30) 100 UNIT/ML FlexPen Inject 30 Units into the skin 2 (two) times daily with a meal. 30 units with breakfast and 30 units with supper   levothyroxine  (SYNTHROID) 150 MCG tablet Take 1 tablet (150 mcg total) by mouth daily before breakfast.   nystatin cream (MYCOSTATIN) APPLY TO AFFECTED AREA TWICE A DAY   pantoprazole (PROTONIX) 40 MG tablet TAKE 1 TABLET BY MOUTH DAILY BEFORE BREAKFAST   traMADol (ULTRAM) 50 MG tablet TAKE 1 TABLET BY MOUTH EVERY 12 HOURS AS NEEDED (Patient taking differently: Take 50 mg by mouth 2 (two) times daily.)   Vitamin D, Ergocalciferol, (DRISDOL) 1.25 MG (50000 UNIT) CAPS capsule Take 1 capsule (50,000 Units total) by mouth every 7 (seven) days.   No facility-administered encounter medications on file as of 01/07/2023.    Past Medical History:  Diagnosis Date   Arthritis    back   Carpal tunnel syndrome    both wrists   Diabetes mellitus without complication ([place])    Dr. Bubba Camp   GERD (gastroesophageal reflux disease)    History of kidney stones    Hyperlipidemia    Hypothyroidism    Kidney stone    Neuropathy    feet and hands   Thyroid disease     Past Surgical History:  Procedure Laterality Date   BACK SURGERY  1990s   CYSTOSCOPY/URETEROSCOPY/HOLMIUM LASER/STENT PLACEMENT Right 06/20/2020   Procedure: CYSTOSCOPY, RIGHT RETROGRADE /STENT PLACEMENT;  Surgeon: Irine Seal, MD;  Location: [facility];  Service: Urology;  Laterality: Right;   CYSTOSCOPY/URETEROSCOPY/HOLMIUM LASER/STENT PLACEMENT Right 07/11/2020   Procedure: CYSTOSCOPY RIGHT URETEROSCOPY/HOLMIUM LASER/STENT EXCHANGE;  Surgeon: Irine Seal, MD;  Location: [facility];  Service: Urology;  Laterality: Right;   FEMUR IM NAIL Left 10/03/2013   Procedure: INTRAMEDULLARY (IM) RETROGRADE FEMORAL NAILING;  Surgeon: Marybelle Killings, MD;  Location: [facility];  Service: Orthopedics;  Laterality: Left;   tubes in ears      Social History   Socioeconomic History   Marital status: Single    Spouse name: Not on file   Number of children: Not on file   Years of education: 12   Highest education level: Not on file  Occupational History   Not on file  Tobacco Use    Smoking status: Never   Smokeless tobacco: Never  Vaping Use   Vaping Use: Never used  Substance and Sexual Activity   Alcohol use: No   Drug use: No   Sexual activity: Never    Birth control/protection: None  Other Topics Concern   Not on file  Social History Narrative   Patient lives at home with her parents [place] and 12.   Patient has a high school certificate.   Right handed.   Caffeine coke cola sometimes.  Social Determinants of Health   Financial Resource Strain: Not on file  Food Insecurity: Not on file  Transportation Needs: Not on file  Physical Activity: Not on file  Stress: Not on file  Social Connections: Not on file  Intimate Partner Violence: Not on file    Family History  Problem Relation Age of Onset   Thyroid disease Mother    Hyperlipidemia Mother    Kidney Stones Father    Thyroid disease Maternal Grandmother    Other Maternal Grandmother        heart issue   Heart attack Maternal Grandfather    Diabetes Maternal Grandfather    Colon cancer Maternal Grandfather    Cancer Maternal Grandfather        prostate   Heart attack Paternal Grandmother        Objective: Vitals:   01/07/23 1528  BP: 101/60  Pulse: 98     Physical Exam  Lab Results:  Results for orders placed or performed in visit on 01/07/23 (from the past 24 hour(s))  Urinalysis, Routine w reflex microscopic     Status: Abnormal   Collection Time: 01/07/23  3:24 PM  Result Value Ref Range   Specific Gravity, UA 1.020 1.005 - 1.030   pH, UA 5.5 5.0 - 7.5   Color, UA Yellow Yellow   Appearance Ur Clear Clear   Leukocytes,UA 1+ (A) Negative   Protein,UA Negative Negative/Trace   Glucose, UA Negative Negative   Ketones, UA Negative Negative   RBC, UA Trace (A) Negative   Bilirubin, UA Negative Negative   Urobilinogen, Ur 0.2 0.2 - 1.0 mg/dL   Nitrite, UA Negative Negative   Microscopic Examination See below:    Narrative   Performed at:  [facility] [address], [place], [place]  [id number] Lab Director: Mina Marble MT, Phone:  [phone]  Microscopic Examination     Status: Abnormal   Collection Time: 01/07/23  3:24 PM   Urine  Result Value Ref Range   WBC, UA 6-10 (A) 0 - 5 /hpf   RBC, Urine 3-10 (A) 0 - 2 /hpf   Epithelial Cells (non renal) >10 (A) 0 - 10 /hpf   Bacteria, UA Moderate (A) None seen/Few   Narrative   Performed at:  [facility] [address] [place], [place]  [id number] Lab Director: Mina Marble MT, Phone:  [phone]      BMET No results for input(s): "NA", "K", "CL", "CO2", "GLUCOSE", "BUN", "CREATININE", "CALCIUM" in the last 72 hours. PSA No results found for: "PSA" No results found for: "TESTOSTERONE"    Studies/Results: CT RENAL STONE STUDY  Result Date: 12/25/2022 CLINICAL DATA:  Nephrolithiasis. EXAM: CT ABDOMEN AND PELVIS WITHOUT CONTRAST TECHNIQUE: Multidetector CT imaging of the abdomen and pelvis was performed following the standard protocol without IV contrast. RADIATION DOSE REDUCTION: This exam was performed according to the departmental dose-optimization program which includes automated exposure control, adjustment of the mA and/or kV according to patient size and/or use of iterative reconstruction technique. COMPARISON:  12/18/2021 FINDINGS: Lower chest: No acute findings. Hepatobiliary: No mass visualized on this unenhanced exam. Gallbladder is unremarkable. No evidence of biliary ductal dilatation. Pancreas: No mass or inflammatory process visualized on this unenhanced exam. Spleen:  Within normal limits in size. Adrenals/Urinary tract: Multiple bilateral renal calculi are again seen, largest in lower pole of right kidney measuring 13 mm. A few tiny calculi are again seen in the right renal pelvis. No evidence  of ureteral calculi or hydronephrosis. Unremarkable unopacified urinary bladder. Stomach/Bowel: Mildly dilated small bowel loops are seen containing  air-fluid levels. Transition point and small bowel feces sign are seen involving distal small bowel loops, several located in a small supraumbilical ventral hernia. This is suspicious for a low-grade small bowel obstruction. No evidence of bowel wall thickening or pneumatosis. No other focal inflammatory process or abnormal fluid collections identified. Normal appendix visualized. Vascular/Lymphatic: No pathologically enlarged lymph nodes identified. No evidence of abdominal aortic aneurysm. Aortic atherosclerotic calcification incidentally noted. Reproductive:  No mass or other significant abnormality. Other:  None. Musculoskeletal:  No suspicious bone lesions identified. IMPRESSION: Bilateral nephrolithiasis. No evidence of ureteral calculi or hydronephrosis. Findings suspicious for low-grade distal small bowel obstruction, due to small supraumbilical ventral hernia. Electronically Signed   By: Marlaine Hind M.D.   On: 12/25/2022 18:39       UA reviewed.  It is unremarkable.    Assessment & Plan: Right renal stones with prior right URS.  CT shows stable non-obstructing stones.   She was encouraged to continue the crystal light.   F/U in 6 months with a KUB and renal US.   History of UTI.  UA has a few WBC and RBC with moderate bacteria.  I will get culture.   No orders of the defined types were placed in this encounter.     Orders Placed This Encounter  Procedures   Urine Culture   Microscopic Examination   DG Abd 1 View    Standing Status:   Future    Standing Expiration Date:   01/07/2024    Order Specific Question:   Reason for Exam (SYMPTOM  OR DIAGNOSIS REQUIRED)    Answer:   renal stones    Order Specific Question:   Preferred imaging location?    Answer:   [facility]    Order Specific Question:   Radiology Contrast Protocol - do NOT remove file path    Answer:   [email or web address]    Order Specific Question:   Is  patient pregnant?    Answer:   Unknown (Please Explain)   US RENAL    Standing Status:   Future    Standing Expiration Date:   01/07/2024    Order Specific Question:   Reason for Exam (SYMPTOM  OR DIAGNOSIS REQUIRED)    Answer:   renal stones    Order Specific Question:   Preferred imaging location?    Answer:   [facility]   Urinalysis, Routine w reflex microscopic      Return in about 6 months (around 07/10/2023) for with KUB and renal US. .   CC: Denyce Robert, FNP  And Dr. Hildred Laser.    Irine Seal 01/08/2023 Patient ID: Earney Mallet, female   DOB: 02/26/1981, 42 y.o.   MRN: [id number]

## 2023-01-08 ENCOUNTER — Encounter: Payer: Self-pay | Admitting: Urology

## 2023-01-08 LAB — URINALYSIS, ROUTINE W REFLEX MICROSCOPIC
Bilirubin, UA: NEGATIVE
Glucose, UA: NEGATIVE
Ketones, UA: NEGATIVE
Nitrite, UA: NEGATIVE
Protein,UA: NEGATIVE
Specific Gravity, UA: 1.02 (ref 1.005–1.030)
Urobilinogen, Ur: 0.2 mg/dL (ref 0.2–1.0)
pH, UA: 5.5 (ref 5.0–7.5)

## 2023-01-08 LAB — MICROSCOPIC EXAMINATION: Epithelial Cells (non renal): >10 /hpf — AB (ref 0–10)

## 2023-01-09 LAB — URINE CULTURE

## 2023-01-11 ENCOUNTER — Telehealth: Payer: Self-pay

## 2023-01-11 NOTE — Telephone Encounter (Signed)
-----   Message from Irine Seal, MD sent at 01/11/2023 11:13 AM EDT ----- Mx species. No treatment.  ----- Message ----- From: Audie Box, CMA Sent: 01/11/2023   8:19 AM EDT To: Irine Seal, MD  Please review.

## 2023-01-11 NOTE — Telephone Encounter (Signed)
Mother aware of MD's response to urine culture and voiced understanding.

## 2023-01-19 NOTE — Progress Notes (Addendum)
Referring Provider:De. Curlene Labrum Primary Care Physician:  Denyce Robert, FNP Primary Gastroenterologist:  Dr. Laural Golden previously, establishing with Dr. Jenetta Downer.   Chief Complaint  Patient presents with   Abdominal Pain    Stomach issues, stomach hurts all over and diarrhea.    HPI:   Alexandra Kelley is a 42 y.o. female presenting today at the request of Dr. Curlene Labrum for IBS with diarrhea.   She has history of diabetes, obesity, Down syndrome, peripheral neuropathy, hypothyroidism, GERD, upper abdominal pain, IBS with diarrhea.  Last seen by Dr. Laural Golden in March 2021.  Heartburn was well-controlled on pantoprazole 40 mg daily.  She continued with postprandial diarrhea having about 3 BMs daily.  Felt dicyclomine was helping but still having sporadic explosive bowel movements.  Mother noted patient was having intermittent epigastric pain.  Recommended continuing pantoprazole, take dicyclomine 30 minutes before meals, MR abdomen with and without contrast, consider blood work if recurrent abdominal pain.  MR abdomen was never completed as patient was not able to tolerate/lay in a specific position for an extended period of time.  CT abdomen with and without contrast 03/22/2020 with multiple nonobstructing bilateral renal calculi, no findings to explain epigastric abdominal pain.  Abdominal ultrasound 04/02/2020 with cholelithiasis without cholecystitis, fatty liver.  Recommended cholecystectomy plus liver biopsy  She saw general surgery 05/22/2020.  Recommended holding off on cholecystectomy due to surgical risk and no clear evidence of symptomatic cholelithiasis.  Recent CT renal study 12/25/2022 with bilateral nephrolithiasis, findings suspicious for low-grade distal small bowel obstruction due to small supraumbilical ventral hernia.  She saw general surgery 12/31/2022.  As patient was overweight, had large pannus that pulled on the area of her hernia, recommended weight loss.   Did not recommend hernia repair given weight and comorbidities as she was not clinically obstructed.  Recommended seeing GI for diarrhea.  Today: Presents today with her mother who provides most of the history today.  History of IBS-D and has had intermittent diarrhea for quite some time using Bentyl as needed.  Reports she developed some abdominal pain, nausea, vomiting, diarrhea when she got home from having her CT scan.  Feeling something was squeezing and releasing within her abdomen.  Nausea and vomiting resolved fairly quickly.  No persistent abdominal pain, but continues to have some lower abdominal pain before bowel movements that improves thereafter.  This is more consistent with her chronic IBS symptoms.  She did see Dr. Constance Haw due to CT findings of possible bowel obstruction and hernia, but states Dr. Constance Haw did not agree with bowel obstruction as patient was having bowel movements and did not recommend any sort of surgery.  She has continued to have more frequent diarrhea than she used to.  Will have about 3 watery bowel movements daily.  Lower abdominal pain prior to bowel movement that improves thereafter.  No nocturnal stools, BRBPR, melena, unintentional weight loss.  She is taking Bentyl 3 times a day before meals.  She does report dairy worsens diarrhea.  May occasionally drink well water.  No recent antibiotics.  Some well water. No Abx.   No heartburn symptoms on pantoprazole 40 mg daily.  No dysphagia    THS low normal January 2024.   Past Medical History:  Diagnosis Date   Arthritis    back   Carpal tunnel syndrome    both wrists   Diabetes mellitus without complication ([place])    Dr. Bubba Camp   GERD (gastroesophageal reflux disease)    History of  kidney stones    Hyperlipidemia    Hypothyroidism    Kidney stone    Neuropathy    feet and hands   Thyroid disease     Past Surgical History:  Procedure Laterality Date   BACK SURGERY  1990s    CYSTOSCOPY/URETEROSCOPY/HOLMIUM LASER/STENT PLACEMENT Right 06/20/2020   Procedure: CYSTOSCOPY, RIGHT RETROGRADE /STENT PLACEMENT;  Surgeon: Irine Seal, MD;  Location: [facility];  Service: Urology;  Laterality: Right;   CYSTOSCOPY/URETEROSCOPY/HOLMIUM LASER/STENT PLACEMENT Right 07/11/2020   Procedure: CYSTOSCOPY RIGHT URETEROSCOPY/HOLMIUM LASER/STENT EXCHANGE;  Surgeon: Irine Seal, MD;  Location: [facility];  Service: Urology;  Laterality: Right;   FEMUR IM NAIL Left 10/03/2013   Procedure: INTRAMEDULLARY (IM) RETROGRADE FEMORAL NAILING;  Surgeon: Marybelle Killings, MD;  Location: [place];  Service: Orthopedics;  Laterality: Left;   tubes in ears      Current Outpatient Medications  Medication Sig Dispense Refill   amitriptyline (ELAVIL) 25 MG tablet Take 25 mg by mouth at bedtime.     BD PEN NEEDLE NANO 2ND GEN 32G X 4 MM MISC USE AS DIRECTED TO INJECT INSULIN TWICE DAILY 200 each 2   Continuous Blood Gluc Sensor (DEXCOM G7 SENSOR) MISC Inject 1 Application into the skin as directed. Change sensor every 10 days as directed. 6 each 3   dicyclomine (BENTYL) 10 MG capsule Take 1 capsule (10 mg total) by mouth 3 (three) times daily as needed for spasms. (Patient taking differently: Take 10 mg by mouth every 8 (eight) hours as needed (stomach spasms.).) 30 capsule 0   gabapentin (NEURONTIN) 300 MG capsule Take 300 mg by mouth 3 (three) times daily.  3   glucose blood (ACCU-CHEK AVIVA PLUS) test strip USE TO TEST BLOOD SUGAR TWICE A DAY AS DIRECTED BY DOCTOR 90     glucose blood test strip See admin instructions.     hydrOXYzine (ATARAX/VISTARIL) 25 MG tablet Take 25 mg by mouth 2 (two) times daily as needed for itching or anxiety.     insulin aspart protamine - aspart (NOVOLOG MIX 70/30 FLEXPEN) (70-30) 100 UNIT/ML FlexPen Inject 30 Units into the skin 2 (two) times daily with a meal. 30 units with breakfast and 30 units with supper 45 mL 3   levothyroxine (SYNTHROID) 150 MCG tablet Take 1 tablet (150 mcg total) by  mouth daily before breakfast. 90 tablet 1   nystatin cream (MYCOSTATIN) APPLY TO AFFECTED AREA TWICE A DAY 30 g 3   pantoprazole (PROTONIX) 40 MG tablet TAKE 1 TABLET BY MOUTH DAILY BEFORE BREAKFAST 90 tablet 0   traMADol (ULTRAM) 50 MG tablet TAKE 1 TABLET BY MOUTH EVERY 12 HOURS AS NEEDED (Patient taking differently: Take 50 mg by mouth 2 (two) times daily.) 60 tablet 3   Vitamin D, Ergocalciferol, (DRISDOL) 1.25 MG (50000 UNIT) CAPS capsule Take 1 capsule (50,000 Units total) by mouth every 7 (seven) days. 12 capsule 2   No current facility-administered medications for this visit.    Allergies as of 01/21/2023 - Review Complete 01/21/2023  Allergen Reaction Noted   5-alpha reductase inhibitors  07/02/2020   Canagliflozin Other (See Comments) 10/20/2013   Melatonin Other (See Comments) 04/21/2021   Metformin Other (See Comments) 04/21/2021   Metformin and related  10/20/2013   Onglyza [saxagliptin]  10/20/2013   Statins Other (See Comments) 04/21/2021   Zofran [ondansetron hcl] Other (See Comments) 09/07/2018   Cefaclor Rash 10/03/2013   Penicillins Rash 01/27/2013   Pregabalin Itching and Rash 01/25/2017   Sitagliptin Rash 11/26/2020  Family History  Problem Relation Age of Onset   Thyroid disease Mother    Hyperlipidemia Mother    Kidney Stones Father    Thyroid disease Maternal Grandmother    Other Maternal Grandmother        heart issue   Heart attack Maternal Grandfather    Diabetes Maternal Grandfather    Colon cancer Maternal Grandfather    Cancer Maternal Grandfather        prostate   Heart attack Paternal Grandmother     Social History   Socioeconomic History   Marital status: Single    Spouse name: Not on file   Number of children: Not on file   Years of education: 12   Highest education level: Not on file  Occupational History   Not on file  Tobacco Use   Smoking status: Never   Smokeless tobacco: Never  Vaping Use   Vaping Use: Never used   Substance and Sexual Activity   Alcohol use: No   Drug use: No   Sexual activity: Never    Birth control/protection: None  Other Topics Concern   Not on file  Social History Narrative   Patient lives at home with her parents [place] and [place].   Patient has a high school certificate.   Right handed.   Caffeine coke cola sometimes.            Social Determinants of Health   Financial Resource Strain: Not on file  Food Insecurity: Not on file  Transportation Needs: Not on file  Physical Activity: Not on file  Stress: Not on file  Social Connections: Not on file  Intimate Partner Violence: Not on file    Review of Systems: Gen: Denies any fever, chills, cold or flu like symptoms, pre-syncope, or syncope.  CV: Denies chest pain, heart palpitations. Resp: Denies shortness of breath, cough.  GI:  See HPI GU : Denies urinary burning, urinary frequency, urinary hesitancy MS: Denies joint pain. Derm: Denies rash. Psych: Denies depression, anxiety. Heme: See HPI  Physical Exam: BP 104/69 (BP Location: Left Arm, Patient Position: Sitting, Cuff Size: Normal)   Pulse 93   Temp 97.7 F (36.5 C) (Temporal)   Ht 5\' 2"  (1.575 m)   Wt 299 lb 6.4 oz (135.8 kg)   SpO2 91%   BMI 54.76 kg/m  General:   Alert and oriented. Pleasant and cooperative. Well-nourished and well-developed.  Head:  Normocephalic and atraumatic. Eyes:  Without icterus, sclera clear and conjunctiva pink.  Ears:  Normal auditory acuity. Lungs:  Clear to auscultation bilaterally. No wheezes, rales, or rhonchi. No distress.  Heart:  S1, S2 present without murmurs appreciated.  Abdomen:  Obese with large panus. +BS, soft, and non-distended. Ventral hernia noted with minimal tenderness, but hernia is soft and reducible.No guarding or rebound.  Rectal:  Deferred  Msk:  Symmetrical without gross deformities. Normal posture. Extremities:  Without edema. Neurologic:  Alert and  oriented x4;  grossly normal  neurologically. Skin:  Intact without significant lesions or rashes. Psych:  Normal mood and affect.    Assessment:  42 year old female with history of diabetes, obesity, Down syndrome, peripheral neuropathy, hypothyroidism, GERD, IBS-D, presenting today for further evaluation of diarrhea.  Diarrhea/abdominal pain: Chronic history of intermittent diarrhea with abdominal pain prior to bowel movements that improves thereafter.  She notes an increase in frequency of diarrhea over the last month with about 3 watery bowel movements daily.  No nocturnal stools, BRBPR, melena, unintentional weight loss.  She had a CT renal study completed 12/25/2022 that showed possible low-grade distal small bowel obstruction due to small supraumbilical ventral hernia.  She saw Dr. Constance Haw with general surgery 2/29 who did not recommend surgery due to body habitus, comorbidities, and as patient was not clinically obstructed.  Currently her symptoms seem most consistent with IBS and possible lactose intolerance, but will check her thyroid function as TSH was low normal in January 2024 and also arrange stool studies to rule out infection.  Plan:  C. difficile toxin A/B with reflex, GI profile panel, TSH Take dicyclomine 3 times daily before meals and at bedtime. Lactose-free diet for the next 2 weeks.  If diarrhea improves, suspect lactose intolerance and would need to continue lactose-free diet or take Lactaid tablets prior to dairy consumption. Could consider course of Xifaxan if stool studies are negative and she has persistent diarrhea. Plan follow-up in 3 months or sooner if needed.   Aliene Altes, PA-C [facility] 01/21/2023   I have reviewed the note and agree with the APP's assessment as described in this progress note  Maylon Peppers, MD Gastroenterology and Hepatology [facility]

## 2023-01-21 ENCOUNTER — Ambulatory Visit: Payer: Medicaid Other | Admitting: Gastroenterology

## 2023-01-21 ENCOUNTER — Encounter: Payer: Self-pay | Admitting: Gastroenterology

## 2023-01-21 VITALS — BP 104/69 | HR 93 | Temp 97.7°F | Ht 62.0 in | Wt 299.4 lb

## 2023-01-21 DIAGNOSIS — R197 Diarrhea, unspecified: Secondary | ICD-10-CM | POA: Diagnosis not present

## 2023-01-21 DIAGNOSIS — R1030 Lower abdominal pain, unspecified: Secondary | ICD-10-CM | POA: Diagnosis not present

## 2023-01-21 NOTE — Patient Instructions (Addendum)
Please have stool to studies and blood work completed at [facility].   Continue to take dicyclomine 3 times daily before meals.  You can also take a fourth dose at bedtime.  Follow a lactose-free diet for the next 2 weeks and monitor your diarrhea.  If your diarrhea improves with a lactose-free diet, this suggest that you have lactose intolerance and will need to continue to follow a lactose-free diet or take Lactaid tablets prior to dairy consumption.  Will have further recommendations for you pending stool study results and your blood work.  Plan to follow-up in 3 months.   It was very nice to meet you today!  Aliene Altes, PA-C [facility]

## 2023-01-22 LAB — TSH: TSH: 0.76 u[IU]/mL (ref 0.450–4.500)

## 2023-01-27 NOTE — Addendum Note (Signed)
Addended by: Harvel Quale on: 01/27/2023 04:53 PM   Modules accepted: Level of Service

## 2023-03-11 ENCOUNTER — Encounter: Payer: Self-pay | Admitting: *Deleted

## 2023-03-11 ENCOUNTER — Other Ambulatory Visit: Payer: Self-pay | Admitting: *Deleted

## 2023-03-11 DIAGNOSIS — N200 Calculus of kidney: Secondary | ICD-10-CM

## 2023-03-23 ENCOUNTER — Telehealth: Payer: Self-pay | Admitting: *Deleted

## 2023-03-23 ENCOUNTER — Encounter: Payer: Self-pay | Admitting: Gastroenterology

## 2023-03-23 DIAGNOSIS — E039 Hypothyroidism, unspecified: Secondary | ICD-10-CM

## 2023-03-23 NOTE — Telephone Encounter (Signed)
Patent's mother called. She shares that Alexandra Kelley contacted her and said that Alexandra Kelley's TSH was at the lower in of normal. She shares that Alexandra Kelley has been complaining that her legs hurt and she is staying cold . She is currently taking 150 mcg of her thyroid medication.  I looked and see that on 01/21/23 TSH was 0.760 and when you did it 4 months ago it was 0.679.

## 2023-03-23 NOTE — Telephone Encounter (Signed)
Have her go ahead and repeat thyroid labs now to see if it is related to the thyroid or if it could be due to something else.  TSH and Free T4, ill put in the order.

## 2023-03-24 NOTE — Telephone Encounter (Signed)
Patient's Mom was called and made aware

## 2023-03-25 ENCOUNTER — Other Ambulatory Visit: Payer: Self-pay | Admitting: Adult Health

## 2023-03-25 ENCOUNTER — Telehealth: Payer: Self-pay | Admitting: Adult Health

## 2023-03-26 LAB — TSH: TSH: 1.28 u[IU]/mL (ref 0.450–4.500)

## 2023-03-26 LAB — T4, FREE: Free T4: 1.24 ng/dL (ref 0.82–1.77)

## 2023-03-26 NOTE — Progress Notes (Signed)
I know I see her next week for an official follow up.  But since her mom called concerned that her thyroid may be off, I just want to reassure her that everything looks great with the thyroid.  Both of her values are right in the middle which means her thyroid dosage is just right.  It could be that her symptoms are related to something else going on.

## 2023-03-30 NOTE — Progress Notes (Signed)
Patients mother was called and made aware.

## 2023-04-01 ENCOUNTER — Encounter: Payer: Self-pay | Admitting: Nurse Practitioner

## 2023-04-01 ENCOUNTER — Ambulatory Visit: Payer: Medicaid Other | Admitting: Nurse Practitioner

## 2023-04-01 VITALS — BP 113/74 | HR 99 | Ht 62.0 in | Wt 297.0 lb

## 2023-04-01 DIAGNOSIS — Z794 Long term (current) use of insulin: Secondary | ICD-10-CM

## 2023-04-01 DIAGNOSIS — E039 Hypothyroidism, unspecified: Secondary | ICD-10-CM | POA: Diagnosis not present

## 2023-04-01 DIAGNOSIS — E119 Type 2 diabetes mellitus without complications: Secondary | ICD-10-CM

## 2023-04-01 LAB — POCT GLYCOSYLATED HEMOGLOBIN (HGB A1C): Hemoglobin A1C: 7.4 % — AB (ref 4.0–5.6)

## 2023-04-01 MED ORDER — LEVOTHYROXINE SODIUM 150 MCG PO TABS: 150.0000 ug | ORAL_TABLET | Freq: Every day | ORAL | 1 refills | Status: DC

## 2023-04-01 MED ORDER — BD PEN NEEDLE NANO 2ND GEN 32G X 4 MM MISC: 2 refills | Status: DC

## 2023-04-01 NOTE — Patient Instructions (Signed)

## 2023-04-01 NOTE — Progress Notes (Signed)
Endocrinology Follow Up Note       04/01/2023, 3:59 PM   Subjective:    Patient ID: Alexandra Kelley, female    DOB: 01-Nov-1981.  Alexandra Kelley is being seen in follow up after being seen in consultation for management of currently uncontrolled symptomatic diabetes and hypothyroidism requested by  Marylynn Pearson, FNP.   Past Medical History:  Diagnosis Date   Arthritis    back   Carpal tunnel syndrome    both wrists   Diabetes mellitus without complication (HCC)    Dr. Lurene Shadow   GERD (gastroesophageal reflux disease)    History of kidney stones    Hyperlipidemia    Hypothyroidism    Kidney stone    Neuropathy    feet and hands   Thyroid disease     Past Surgical History:  Procedure Laterality Date   BACK SURGERY  1990s   CYSTOSCOPY/URETEROSCOPY/HOLMIUM LASER/STENT PLACEMENT Right 06/20/2020   Procedure: CYSTOSCOPY, RIGHT RETROGRADE /STENT PLACEMENT;  Surgeon: Bjorn Pippin, MD;  Location: [facility];  Service: Urology;  Laterality: Right;   CYSTOSCOPY/URETEROSCOPY/HOLMIUM LASER/STENT PLACEMENT Right 07/11/2020   Procedure: CYSTOSCOPY RIGHT URETEROSCOPY/HOLMIUM LASER/STENT EXCHANGE;  Surgeon: Bjorn Pippin, MD;  Location: [facility];  Service: Urology;  Laterality: Right;   FEMUR IM NAIL Left 10/03/2013   Procedure: INTRAMEDULLARY (IM) RETROGRADE FEMORAL NAILING;  Surgeon: Eldred Manges, MD;  Location: [facility];  Service: Orthopedics;  Laterality: Left;   tubes in ears      Social History   Socioeconomic History   Marital status: Single    Spouse name: Not on file   Number of children: Not on file   Years of education: 12   Highest education level: Not on file  Occupational History   Not on file  Tobacco Use   Smoking status: Never   Smokeless tobacco: Never  Vaping Use   Vaping Use: Never used  Substance and Sexual Activity   Alcohol use: No   Drug use: No   Sexual activity: Never    Birth  control/protection: None  Other Topics Concern   Not on file  Social History Narrative   Patient lives at home with her parents [place] and 15.   Patient has a high school certificate.   Right handed.   Caffeine coke cola sometimes.            Social Determinants of Health   Financial Resource Strain: Not on file  Food Insecurity: Not on file  Transportation Needs: Not on file  Physical Activity: Not on file  Stress: Not on file  Social Connections: Not on file    Family History  Problem Relation Age of Onset   Thyroid disease Mother    Hyperlipidemia Mother    Kidney Stones Father    Thyroid disease Maternal Grandmother    Other Maternal Grandmother        heart issue   Heart attack Maternal Grandfather    Diabetes Maternal Grandfather    Colon cancer Maternal Grandfather    Cancer Maternal Grandfather        prostate   Heart attack Paternal Grandmother     Outpatient Encounter Medications as of 04/01/2023  Medication Sig   amitriptyline (ELAVIL) 25 MG tablet Take 25 mg by mouth at bedtime.   dicyclomine (BENTYL) 10 MG capsule Take 1 capsule (10 mg total) by mouth 3 (three) times daily as needed for spasms. (Patient taking differently: Take 10 mg by mouth every 8 (eight) hours as needed (stomach spasms.).)   gabapentin (NEURONTIN) 300 MG capsule Take 300 mg by mouth 3 (three) times daily.   glucose blood (ACCU-CHEK AVIVA PLUS) test strip USE TO TEST BLOOD SUGAR TWICE A DAY AS DIRECTED BY DOCTOR 90   glucose blood test strip See admin instructions.   hydrOXYzine (ATARAX/VISTARIL) 25 MG tablet Take 25 mg by mouth 2 (two) times daily as needed for itching or anxiety.   insulin aspart protamine - aspart (NOVOLOG MIX 70/30 FLEXPEN) (70-30) 100 UNIT/ML FlexPen Inject 30 Units into the skin 2 (two) times daily with a meal. 30 units with breakfast and 30 units with supper   nystatin cream (MYCOSTATIN) APPLY TO AFFECTED AREA TWICE A DAY   pantoprazole (PROTONIX) 40 MG tablet  TAKE 1 TABLET BY MOUTH DAILY BEFORE BREAKFAST   traMADol (ULTRAM) 50 MG tablet TAKE 1 TABLET BY MOUTH EVERY 12 HOURS AS NEEDED (Patient taking differently: Take 50 mg by mouth 2 (two) times daily.)   Vitamin D, Ergocalciferol, (DRISDOL) 1.25 MG (50000 UNIT) CAPS capsule Take 1 capsule (50,000 Units total) by mouth every 7 (seven) days.   [DISCONTINUED] BD PEN NEEDLE NANO 2ND GEN 32G X 4 MM MISC USE AS DIRECTED TO INJECT INSULIN TWICE DAILY   [DISCONTINUED] levothyroxine (SYNTHROID) 150 MCG tablet Take 1 tablet (150 mcg total) by mouth daily before breakfast.   BD PEN NEEDLE NANO 2ND GEN 32G X 4 MM MISC USE AS DIRECTED TO INJECT INSULIN TWICE DAILY   Continuous Blood Gluc Sensor (DEXCOM G7 SENSOR) MISC Inject 1 Application into the skin as directed. Change sensor every 10 days as directed. (Patient not taking: Reported on 04/01/2023)   levothyroxine (SYNTHROID) 150 MCG tablet Take 1 tablet (150 mcg total) by mouth daily before breakfast.   [DISCONTINUED] nystatin cream (MYCOSTATIN) APPLY TO AFFECTED AREA TWICE A DAY   No facility-administered encounter medications on file as of 04/01/2023.    ALLERGIES: Allergies  Allergen Reactions   5-Alpha Reductase Inhibitors    Canagliflozin Other (See Comments)    Severe yeast infections   Melatonin Other (See Comments)   Metformin Other (See Comments)   Metformin And Related     GI distress   Onglyza [Saxagliptin]     headaches   Statins Other (See Comments)   Zofran [Ondansetron Hcl] Other (See Comments)    Itchy rash in mouth   Cefaclor Rash    Rash   Penicillins Rash    Has patient had a PCN reaction causing immediate rash, facial/tongue/throat swelling, SOB or lightheadedness with hypotension: No Has patient had a PCN reaction causing severe rash involving mucus membranes or skin necrosis: No Has patient had a PCN reaction that required hospitalization: No Has patient had a PCN reaction occurring within the last 10 years: Yes If all of the  above answers are "NO", then may proceed with Cephalosporin use.    Pregabalin Itching and Rash   Sitagliptin Rash    VACCINATION STATUS: There is no immunization history for the selected administration types on file for this patient.  Diabetes She presents for her follow-up diabetic visit. She has type 2 diabetes mellitus. Onset time: diagnosed at approx age of 43. Her disease course has  been stable. There are no hypoglycemic associated symptoms. Pertinent negatives for hypoglycemia include no nervousness/anxiousness or tremors. There are no diabetic associated symptoms. Pertinent negatives for diabetes include no fatigue. There are no hypoglycemic complications. Symptoms are stable. Diabetic complications include peripheral neuropathy. Risk factors for coronary artery disease include diabetes mellitus and obesity. Current diabetic treatment includes insulin injections. She is compliant with treatment most of the time. Her weight is fluctuating minimally. She is following a generally healthy diet. When asked about meal planning, she reported none. She has not had a previous visit with a dietitian. She never participates in exercise. Her home blood glucose trend is fluctuating minimally. Her overall blood glucose range is 140-180 mg/dl. (She presents today, accompanied by her mother, with her meter, no logs, showing stable, slightly above target glycemic profile overall.  Her POCT A1c today is 7.4%, improving from last visit of 7.7%.  She tried the Dexcom CGM but the sensor would not stay on for the full 10 days therefore she went back to fingersticks.  Analysis of her meter shows 7-day average of 176, 14-day average of 170, 30-day average of 172.) An ACE inhibitor/angiotensin II receptor blocker is not being taken. She sees a podiatrist.Eye exam is current.  Hyperlipidemia This is a chronic problem. The current episode started more than 1 year ago. Exacerbating diseases include diabetes, hypothyroidism  and obesity. Factors aggravating her hyperlipidemia include fatty foods. Current antihyperlipidemic treatment includes statins. Compliance problems include adherence to diet and adherence to exercise.  Risk factors for coronary artery disease include diabetes mellitus, dyslipidemia, family history, obesity and a sedentary lifestyle.  Thyroid Problem Presents for initial visit. Symptoms include weight gain. Patient reports no anxiety, cold intolerance, constipation, depressed mood, fatigue, hair loss, heat intolerance, leg swelling, palpitations or tremors. Past treatments include levothyroxine. The treatment provided moderate relief. Her past medical history is significant for diabetes, hyperlipidemia and obesity. Risk factors include family history of hypothyroidism.    Review of systems  Constitutional: + slowly decreasing body weight,  current Body mass index is 54.32 kg/m. , no fatigue, no subjective hyperthermia, no subjective hypothermia Eyes: no blurry vision, no xerophthalmia ENT: no sore throat, no nodules palpated in throat, no dysphagia/odynophagia, no hoarseness Cardiovascular: no chest pain, no shortness of breath, no palpitations, no leg swelling Respiratory: no cough, no shortness of breath Gastrointestinal: no nausea/vomiting/diarrhea Musculoskeletal: no muscle/joint aches Skin: no rashes, no hyperemia Neurological: no tremors, no numbness, no tingling, no dizziness Psychiatric: no depression, no anxiety  Objective:     BP 113/74 (BP Location: Left Arm, Patient Position: Sitting, Cuff Size: Large)   Pulse 99   Ht 5\' 2"  (1.575 m)   Wt 297 lb (134.7 kg)   BMI 54.32 kg/m   Wt Readings from Last 3 Encounters:  04/01/23 297 lb (134.7 kg)  01/21/23 299 lb 6.4 oz (135.8 kg)  01/07/23 (!) 303 lb (137.4 kg)     BP Readings from Last 3 Encounters:  04/01/23 113/74  01/21/23 104/69  01/07/23 101/60      Physical Exam- Limited  Constitutional:  Body mass index is 54.32  kg/m. , not in acute distress, normal state of mind Eyes:  EOMI, no exophthalmos Musculoskeletal: no gross deformities, strength intact in all four extremities, no gross restriction of joint movements Skin:  no rashes, no hyperemia Neurological: no tremor with outstretched hands    Diabetic Foot Exam - Simple   No data filed     CMP ( most recent) CMP  Component Value Date/Time   NA 138 07/03/2020 1438   K 4.4 07/03/2020 1438   CL 97 (L) 07/03/2020 1438   CO2 29 07/03/2020 1438   GLUCOSE 170 (H) 07/03/2020 1438   BUN 9 07/03/2020 1438   CREATININE 0.72 07/03/2020 1438   CALCIUM 9.6 07/03/2020 1438   PROT 6.8 10/18/2018 0458   ALBUMIN 3.2 (L) 10/18/2018 0458   AST 18 10/18/2018 0458   ALT 19 10/18/2018 0458   ALKPHOS 56 10/18/2018 0458   BILITOT 0.9 10/18/2018 0458   GFRNONAA >60 07/03/2020 1438   GFRAA >60 07/03/2020 1438     Diabetic Labs (most recent): Lab Results  Component Value Date   HGBA1C 7.4 (A) 04/01/2023   HGBA1C 7.7 (A) 11/26/2022   HGBA1C 7.6 (A) 06/18/2022     Lipid Panel ( most recent) Lipid Panel  No results found for: "CHOL", "TRIG", "HDL", "CHOLHDL", "VLDL", "LDLCALC", "LDLDIRECT", "LABVLDL"    Lab Results  Component Value Date   TSH 1.280 03/25/2023   TSH 0.760 01/21/2023   TSH 0.679 11/16/2022   TSH 0.578 12/25/2021   TSH 0.033 (L) 09/04/2021   TSH 1.812 10/17/2018   TSH 5.154 (H) 10/03/2013   FREET4 1.24 03/25/2023   FREET4 1.26 11/16/2022   FREET4 1.32 12/25/2021   FREET4 1.68 09/04/2021           Assessment & Plan:   1) Uncontrolled type 2 diabetes with long term insulin therapy  She presents today, accompanied by her mother, with her meter, no logs, showing stable, slightly above target glycemic profile overall.  Her POCT A1c today is 7.4%, improving from last visit of 7.7%.  She tried the Dexcom CGM but the sensor would not stay on for the full 10 days therefore she went back to fingersticks.  Analysis of her meter  shows 7-day average of 176, 14-day average of 170, 30-day average of 172.  - Alexandra Kelley has currently uncontrolled symptomatic type 2 DM since 42 years of age.   -Recent labs reviewed.  - I had a long discussion with her about the progressive nature of diabetes and the pathology behind its complications. -her diabetes is complicated by peripheral neuropathy and she remains at a high risk for more acute and chronic complications which include CAD, CVA, CKD, retinopathy, and neuropathy. These are all discussed in detail with her.  - Nutritional counseling repeated at each appointment due to patients tendency to fall back in to old habits.  - The patient admits there is a room for improvement in their diet and drink choices. -  Suggestion is made for the patient to avoid simple carbohydrates from their diet including Cakes, Sweet Desserts / Pastries, Ice Cream, Soda (diet and regular), Sweet Tea, Candies, Chips, Cookies, Sweet Pastries, Store Bought Juices, Alcohol in Excess of 1-2 drinks a day, Artificial Sweeteners, Coffee Creamer, and "Sugar-free" Products. This will help patient to have stable blood glucose profile and potentially avoid unintended weight gain.   - I encouraged the patient to switch to unprocessed or minimally processed complex starch and increased protein intake (animal or plant source), fruits, and vegetables.   - Patient is advised to stick to a routine mealtimes to eat 3 meals a day and avoid unnecessary snacks (to snack only to correct hypoglycemia).  - she will be scheduled with Norm Salt, RDN, CDE for diabetes education.  - I have approached her with the following individualized plan to manage her diabetes and patient agrees:   -She is  advised to continue her premixed insulin 70/30  30 units with breakfast and 30 units with supper if glucose is above 90 and she is eating.    -she is encouraged to continue monitoring blood glucose 3 times daily, before  injecting insulin and before bed, and to call the clinic if she has readings less than 70 or above 200 for 3 tests in a row.  She could benefit from CGM device.  I did give her sample of Skin Tac wipe to see if it helps the sensor stay on and gave her advise to use Flonase on the site prior to putting sensor on to avoid any irritation to the skin.  - she is warned not to take insulin without proper monitoring per orders. - Adjustment parameters are given to her for hypo and hyperglycemia in writing.  - she will be considered for incretin therapy as appropriate next visit.  - Specific targets for  A1c; LDL, HDL, and Triglycerides were discussed with the patient.  2) Blood Pressure /Hypertension:  her blood pressure is controlled to target without the use of antihypertensives.    3) Lipids/Hyperlipidemia:    Her recent lipid panel from 02/24/22 shows uncontrolled LDL of 127 and elevated triglycerides of 172.   she is advised to continue Crestor 40 mg daily at bedtime.  Side effects and precautions discussed with her.    4)  Weight/Diet:  her Body mass index is 54.32 kg/m.  -  clearly complicating her diabetes care.   she is a candidate for weight loss. I discussed with her the fact that loss of 5 - 10% of her  current body weight will have the most impact on her diabetes management.  Exercise, and detailed carbohydrates information provided  -  detailed on discharge instructions.  5) Hypothyroidism-unspecified Her previsit TFTs are consistent with appropriate hormone replacement.   She is advised to continue Levothyroxine 150 mcg po daily before breakfast.  I do not feel that her cold sensation and leg aches are coming from her thyroid.   - The correct intake of thyroid hormone (Levothyroxine, Synthroid), is on empty stomach first thing in the morning, with water, separated by at least 30 minutes from breakfast and other medications,  and separated by more than 4 hours from calcium, iron,  multivitamins, acid reflux medications (PPIs).  - This medication is a life-long medication and will be needed to correct thyroid hormone imbalances for the rest of your life.  The dose may change from time to time, based on thyroid blood work.  - It is extremely important to be consistent taking this medication, near the same time each morning.  -AVOID TAKING PRODUCTS CONTAINING BIOTIN (commonly found in Hair, Skin, Nails vitamins) AS IT INTERFERES WITH THE VALIDITY OF THYROID FUNCTION BLOOD TESTS.  6) Vitamin D deficiency Her recent vitamin d level from 11/16/22 was 15.  She could benefit from replenishment with Ergocalciferol 50000 units po weekly x 12 weeks.  I did give her additional refills as I think she will need it for prolonged period of time.  7) Chronic Care/Health Maintenance: -she is not on ACEI/ARB and is on Statin medications and is encouraged to initiate and continue to follow up with Ophthalmology, Dentist, Podiatrist at least yearly or according to recommendations, and advised to stay away from smoking. I have recommended yearly flu vaccine and pneumonia vaccine at least every 5 years; moderate intensity exercise for up to 150 minutes weekly; and sleep for at least 7 hours  a day.  - she is advised to maintain close follow up with Marylynn Pearson, FNP for primary care needs, as well as her other providers for optimal and coordinated care.      I spent  30  minutes in the care of the patient today including review of labs from CMP, Lipids, Thyroid Function, Hematology (current and previous including abstractions from other facilities); face-to-face time discussing  her blood glucose readings/logs, discussing hypoglycemia and hyperglycemia episodes and symptoms, medications doses, her options of short and long term treatment based on the latest standards of care / guidelines;  discussion about incorporating lifestyle medicine;  and documenting the encounter. Risk reduction  counseling performed per USPSTF guidelines to reduce obesity and cardiovascular risk factors.     Please refer to Patient Instructions for Blood Glucose Monitoring and Insulin/Medications Dosing Guide"  in media tab for additional information. Please  also refer to " Patient Self Inventory" in the Media  tab for reviewed elements of pertinent patient history.  Kenni A Osley participated in the discussions, expressed understanding, and voiced agreement with the above plans.  All questions were answered to her satisfaction. she is encouraged to contact clinic should she have any questions or concerns prior to her return visit.     Follow up plan: - Return in about 4 months (around 08/02/2023) for Diabetes F/U with A1c in office, No previsit labs, Bring meter and logs.   Ronny Bacon, [facility] [address] [place], [place] [address] Phone: [phone] Fax: [phone]  04/01/2023, 3:59 PM

## 2023-04-09 NOTE — Telephone Encounter (Signed)
done

## 2023-06-15 ENCOUNTER — Other Ambulatory Visit (HOSPITAL_COMMUNITY): Payer: Self-pay

## 2023-07-02 ENCOUNTER — Ambulatory Visit (HOSPITAL_COMMUNITY)
Admission: RE | Admit: 2023-07-02 | Discharge: 2023-07-02 | Disposition: A | Discharge status: Home / Self Care | Payer: Medicaid Other | Source: Ambulatory Visit | Attending: Urology | Admitting: Urology

## 2023-07-02 DIAGNOSIS — N200 Calculus of kidney: Secondary | ICD-10-CM | POA: Diagnosis not present

## 2023-07-08 ENCOUNTER — Ambulatory Visit: Payer: Medicaid Other | Admitting: Urology

## 2023-07-08 ENCOUNTER — Encounter: Payer: Self-pay | Admitting: Urology

## 2023-07-08 VITALS — BP 107/64 | HR 116 | Ht 62.0 in | Wt 297.0 lb

## 2023-07-08 DIAGNOSIS — N200 Calculus of kidney: Secondary | ICD-10-CM

## 2023-07-08 DIAGNOSIS — Z87440 Personal history of urinary (tract) infections: Secondary | ICD-10-CM | POA: Diagnosis not present

## 2023-07-08 NOTE — Progress Notes (Signed)
d Subjective:  1. Bilateral renal stones   2. Personal history of urinary infection    9/45/24: Alexandra Kelley returns today in f/u. She has had no hematuria, pain or UTI's.   She has bilateral renal stones without obstruction on KUB and RUS prior to this visit with the largest on the right 1.6cm and 1.3cm on the left.  Her UA has 6-10 WBC's.   01/07/23: Alexandra Kelley returns today in f/u.  She had a CT in February and had stable non-obstructing renal stones, right > left.  She had some increased diameter of the small bowel and there was a question of a partial SBO but she has seen Dr. Henreitta Leber and she didn't think that was what was going on.   She is voiding well and she has no flank pain or hematuria.    06/25/22: Alexandra Kelley returns today in f/u for her history of stones as noted below.  She is doing well without flank pain or hematuria.   There are stable stones on KUB with a 9mm stone that has the appearance of being in there proximal ureter but on the prior CT the lower pole is very medial and the stone in the lower pole on the CT overlies the spine in a similar location as today's KUB.  The renal US shows no hydro.    12/23/21: Alexandra Kelley returns today in f/u for history of stones.  She had a right ureteroscopy for multiple right renal stones on 07/11/20.  She had her stent removed on 07/16/20.   Her stones were calcium oxalate.  She had a post op CT in 10/21 and had multiple residual non-obstructing right renal and left renal stones but the stone burden was reduced.   At CT prior to this visit shows possible a small increase in stone burden or probably just a repositioning of the stone burden as she has more stones in the right renal pelvis and less in the RUP that on her prior scan.  She has done well without pain, fever or hematuria. Her UA has 1+ LE.  Her stones are primarily calcium oxalate.  She is trying to hydrate and avoid salt.     ROS:  ROS:  A complete review of systems was performed.  All systems are  negative except for pertinent findings as noted.   ROS  Allergies  Allergen Reactions   5-Alpha Reductase Inhibitors    Canagliflozin Other (See Comments)    Severe yeast infections   Melatonin Other (See Comments)   Metformin Other (See Comments)   Metformin And Related     GI distress   Onglyza [Saxagliptin]     headaches   Statins Other (See Comments)   Zofran [Ondansetron Hcl] Other (See Comments)    Itchy rash in mouth   Cefaclor Rash    Rash   Penicillins Rash    Has patient had a PCN reaction causing immediate rash, facial/tongue/throat swelling, SOB or lightheadedness with hypotension: No Has patient had a PCN reaction causing severe rash involving mucus membranes or skin necrosis: No Has patient had a PCN reaction that required hospitalization: No Has patient had a PCN reaction occurring within the last 10 years: Yes If all of the above answers are "NO", then may proceed with Cephalosporin use.    Pregabalin Itching and Rash   Sitagliptin Rash    Outpatient Encounter Medications as of 07/08/2023  Medication Sig   amitriptyline (ELAVIL) 25 MG tablet Take 25 mg by mouth at bedtime.  BD PEN NEEDLE NANO 2ND GEN 32G X 4 MM MISC USE AS DIRECTED TO INJECT INSULIN TWICE DAILY   Continuous Blood Gluc Sensor (DEXCOM G7 SENSOR) MISC Inject 1 Application into the skin as directed. Change sensor every 10 days as directed. (Patient not taking: Reported on 04/01/2023)   dicyclomine (BENTYL) 10 MG capsule Take 1 capsule (10 mg total) by mouth 3 (three) times daily as needed for spasms. (Patient taking differently: Take 10 mg by mouth every 8 (eight) hours as needed (stomach spasms.).)   gabapentin (NEURONTIN) 300 MG capsule Take 300 mg by mouth 3 (three) times daily.   glucose blood (ACCU-CHEK AVIVA PLUS) test strip USE TO TEST BLOOD SUGAR TWICE A DAY AS DIRECTED BY DOCTOR 90   glucose blood test strip See admin instructions.   hydrOXYzine (ATARAX/VISTARIL) 25 MG tablet Take 25 mg by  mouth 2 (two) times daily as needed for itching or anxiety.   insulin aspart protamine - aspart (NOVOLOG MIX 70/30 FLEXPEN) (70-30) 100 UNIT/ML FlexPen Inject 30 Units into the skin 2 (two) times daily with a meal. 30 units with breakfast and 30 units with supper   levothyroxine (SYNTHROID) 150 MCG tablet Take 1 tablet (150 mcg total) by mouth daily before breakfast.   nystatin cream (MYCOSTATIN) APPLY TO AFFECTED AREA TWICE A DAY   pantoprazole (PROTONIX) 40 MG tablet TAKE 1 TABLET BY MOUTH DAILY BEFORE BREAKFAST   traMADol (ULTRAM) 50 MG tablet TAKE 1 TABLET BY MOUTH EVERY 12 HOURS AS NEEDED (Patient taking differently: Take 50 mg by mouth 2 (two) times daily.)   Vitamin D, Ergocalciferol, (DRISDOL) 1.25 MG (50000 UNIT) CAPS capsule Take 1 capsule (50,000 Units total) by mouth every 7 (seven) days.   No facility-administered encounter medications on file as of 07/08/2023.    Past Medical History:  Diagnosis Date   Arthritis    back   Carpal tunnel syndrome    both wrists   Diabetes mellitus without complication (HCC)    Dr. Lurene Shadow   GERD (gastroesophageal reflux disease)    History of kidney stones    Hyperlipidemia    Hypothyroidism    Kidney stone    Neuropathy    feet and hands   Thyroid disease     Past Surgical History:  Procedure Laterality Date   BACK SURGERY  1990s   CYSTOSCOPY/URETEROSCOPY/HOLMIUM LASER/STENT PLACEMENT Right 06/20/2020   Procedure: CYSTOSCOPY, RIGHT RETROGRADE /STENT PLACEMENT;  Surgeon: Bjorn Pippin, MD;  Location: [facility];  Service: Urology;  Laterality: Right;   CYSTOSCOPY/URETEROSCOPY/HOLMIUM LASER/STENT PLACEMENT Right 07/11/2020   Procedure: CYSTOSCOPY RIGHT URETEROSCOPY/HOLMIUM LASER/STENT EXCHANGE;  Surgeon: Bjorn Pippin, MD;  Location: [facility];  Service: Urology;  Laterality: Right;   FEMUR IM NAIL Left 10/03/2013   Procedure: INTRAMEDULLARY (IM) RETROGRADE FEMORAL NAILING;  Surgeon: Eldred Manges, MD;  Location: [facility];  Service: Orthopedics;   Laterality: Left;   tubes in ears      Social History   Socioeconomic History   Marital status: Single    Spouse name: Not on file   Number of children: Not on file   Years of education: 12   Highest education level: Not on file  Occupational History   Not on file  Tobacco Use   Smoking status: Never   Smokeless tobacco: Never  Vaping Use   Vaping status: Never Used  Substance and Sexual Activity   Alcohol use: No   Drug use: No   Sexual activity: Never    Birth control/protection: None  Other Topics Concern   Not on file  Social History Narrative   Patient lives at home with her parents Babette Relic and 22.   Patient has a high school certificate.   Right handed.   Caffeine coke cola sometimes.            Social Determinants of Health   Financial Resource Strain: Not on file  Food Insecurity: Not on file  Transportation Needs: Not on file  Physical Activity: Not on file  Stress: Not on file  Social Connections: Not on file  Intimate Partner Violence: Not on file    Family History  Problem Relation Age of Onset   Thyroid disease Mother    Hyperlipidemia Mother    Kidney Stones Father    Thyroid disease Maternal Grandmother    Other Maternal Grandmother        heart issue   Heart attack Maternal Grandfather    Diabetes Maternal Grandfather    Colon cancer Maternal Grandfather    Cancer Maternal Grandfather        prostate   Heart attack Paternal Grandmother        Objective: Vitals:   07/08/23 1522  BP: 107/64  Pulse: (!) 116     Physical Exam  Lab Results:  Results for orders placed or performed in visit on 07/08/23 (from the past 24 hour(s))  Urinalysis, Routine w reflex microscopic     Status: Abnormal   Collection Time: 07/08/23  3:24 PM  Result Value Ref Range   Specific Gravity, UA 1.015 1.005 - 1.030   pH, UA 6.0 5.0 - 7.5   Color, UA Yellow Yellow   Appearance Ur Clear Clear   Leukocytes,UA 1+ (A) Negative   Protein,UA Negative  Negative/Trace   Glucose, UA Negative Negative   Ketones, UA Negative Negative   RBC, UA Negative Negative   Bilirubin, UA Negative Negative   Urobilinogen, Ur 0.2 0.2 - 1.0 mg/dL   Nitrite, UA Negative Negative   Microscopic Examination See below:    Narrative   Performed at:  [address] - [facility] [address] [place], [place]  [id number] Lab Director: Chinita Pester MT, Phone:  [phone]  Microscopic Examination     Status: Abnormal   Collection Time: 07/08/23  3:24 PM   Urine  Result Value Ref Range   WBC, UA 6-10 (A) 0 - 5 /hpf   RBC, Urine 0-2 0 - 2 /hpf   Epithelial Cells (non renal) 0-10 0 - 10 /hpf   Bacteria, UA Few (A) None seen/Few   Narrative   Performed at:  [address] - [facility] [address], [place], [place]  [id number] Lab Director: Chinita Pester MT, Phone:  [phone]       BMET No results for input(s): "NA", "K", "CL", "CO2", "GLUCOSE", "BUN", "CREATININE", "CALCIUM" in the last 72 hours. PSA No results found for: "PSA" No results found for: "TESTOSTERONE"    Studies/Results: DG Abd 1 View  Result Date: 07/04/2023 CLINICAL DATA:  Bilateral renal stones. EXAM: ABDOMEN - 1 VIEW COMPARISON:  CT 12/25/2022, x-ray 06/22/2022. FINDINGS: The bowel gas pattern is normal. Numerous calcified renal stones bilateral upper abdomen measuring 3-5 mm. Posterior soft tissue calcifications overlie the pelvis. Findings are stable. IMPRESSION: Stable appearance with bilateral nephrolithiasis. Electronically Signed   By: Layla Maw M.D.   On: 07/04/2023 14:09   US RENAL  Result Date: 07/02/2023 CLINICAL DATA:  Kidney stones. EXAM: RENAL / URINARY TRACT ULTRASOUND COMPLETE COMPARISON:  June 22, 2022 FINDINGS: Right Kidney: Renal measurements: 12.8 x 4.7 x 5 cm = volume: 159.3 mL. Kidney stones are identified, largest measures 1.6 cm. Echogenicity within normal limits. No mass or hydronephrosis visualized. Left Kidney: Renal measurements: 11.2 x 5.1 x  4.7 cm = volume: 141.1 mL. 1.3 cm kidney stones identified in the midpole. Echogenicity within normal limits. No mass or hydronephrosis visualized. Bladder: Appears normal for degree of bladder distention. Other: None. IMPRESSION: Bilateral nephrolithiasis. No hydronephrosis bilaterally. Electronically Signed   By: Sherian Rein M.D.   On: 07/02/2023 14:29       UA reviewed.  It is unremarkable.    Assessment & Plan: Bilateral  renal stones with prior right URS.  KUB and RUS shows stable non-obstructing stones.   She was encouraged to continue the crystal light.   F/U in 6 months with a  CT stone study.  History of UTI.  UA has a few WBC and bacteria.  No need for a culture.   No orders of the defined types were placed in this encounter.     Orders Placed This Encounter  Procedures   Microscopic Examination   CT RENAL STONE STUDY    Standing Status:   Future    Standing Expiration Date:   07/07/2024    Order Specific Question:   Preferred imaging location?    Answer:   [facility]    Order Specific Question:   Radiology Contrast Protocol - do NOT remove file path    Answer:   [email or web address]    Order Specific Question:   Is patient pregnant?    Answer:   No   Urinalysis, Routine w reflex microscopic      Return in about 6 months (around 01/05/2024) for with CT results.   CC: Marylynn Pearson, FNP  And Dr. Lionel December.    Bjorn Pippin 07/09/2023 Patient ID: Alexandra Kelley, female   DOB: [birth year], 42 y.o.   MRN: [id number]

## 2023-07-09 LAB — URINALYSIS, ROUTINE W REFLEX MICROSCOPIC
Bilirubin, UA: NEGATIVE
Glucose, UA: NEGATIVE
Ketones, UA: NEGATIVE
Nitrite, UA: NEGATIVE
Protein,UA: NEGATIVE
RBC, UA: NEGATIVE
Specific Gravity, UA: 1.015 (ref 1.005–1.030)
Urobilinogen, Ur: 0.2 mg/dL (ref 0.2–1.0)
pH, UA: 6 (ref 5.0–7.5)

## 2023-07-09 LAB — MICROSCOPIC EXAMINATION

## 2023-08-05 ENCOUNTER — Encounter: Payer: Self-pay | Admitting: Nurse Practitioner

## 2023-08-05 ENCOUNTER — Ambulatory Visit: Payer: Medicaid Other | Admitting: Nurse Practitioner

## 2023-08-05 VITALS — BP 115/66 | HR 94 | Ht 62.0 in | Wt 303.2 lb

## 2023-08-05 DIAGNOSIS — E119 Type 2 diabetes mellitus without complications: Secondary | ICD-10-CM

## 2023-08-05 DIAGNOSIS — Z794 Long term (current) use of insulin: Secondary | ICD-10-CM

## 2023-08-05 DIAGNOSIS — E039 Hypothyroidism, unspecified: Secondary | ICD-10-CM | POA: Diagnosis not present

## 2023-08-05 LAB — POCT GLYCOSYLATED HEMOGLOBIN (HGB A1C): Hemoglobin A1C: 7.7 % — AB (ref 4.0–5.6)

## 2023-08-05 MED ORDER — ACCU-CHEK AVIVA PLUS VI STRP: ORAL_STRIP | 6 refills | Status: DC

## 2023-08-05 MED ORDER — NOVOLOG MIX 70/30 FLEXPEN (70-30) 100 UNIT/ML ~~LOC~~ SUPN: 40.0000 [IU] | PEN_INJECTOR | Freq: Two times a day (BID) | SUBCUTANEOUS | 3 refills | Status: DC

## 2023-08-05 MED ORDER — LEVOTHYROXINE SODIUM 150 MCG PO TABS: 150.0000 ug | ORAL_TABLET | Freq: Every day | ORAL | 1 refills | Status: DC

## 2023-08-05 NOTE — Progress Notes (Signed)
Endocrinology Follow Up Note       08/05/2023, 3:53 PM   Subjective:    Patient ID: Alexandra Kelley, female    DOB: 27-May-1981.  Alexandra Kelley is being seen in follow up after being seen in consultation for management of currently uncontrolled symptomatic diabetes and hypothyroidism requested by  Marylynn Pearson, FNP.   Past Medical History:  Diagnosis Date   Arthritis    back   Carpal tunnel syndrome    both wrists   Diabetes mellitus without complication (HCC)    Dr. Lurene Shadow   GERD (gastroesophageal reflux disease)    History of kidney stones    Hyperlipidemia    Hypothyroidism    Kidney stone    Neuropathy    feet and hands   Thyroid disease     Past Surgical History:  Procedure Laterality Date   BACK SURGERY  1990s   CYSTOSCOPY/URETEROSCOPY/HOLMIUM LASER/STENT PLACEMENT Right 06/20/2020   Procedure: CYSTOSCOPY, RIGHT RETROGRADE /STENT PLACEMENT;  Surgeon: Bjorn Pippin, MD;  Location: [facility];  Service: Urology;  Laterality: Right;   CYSTOSCOPY/URETEROSCOPY/HOLMIUM LASER/STENT PLACEMENT Right 07/11/2020   Procedure: CYSTOSCOPY RIGHT URETEROSCOPY/HOLMIUM LASER/STENT EXCHANGE;  Surgeon: Bjorn Pippin, MD;  Location: [facility];  Service: Urology;  Laterality: Right;   FEMUR IM NAIL Left 10/03/2013   Procedure: INTRAMEDULLARY (IM) RETROGRADE FEMORAL NAILING;  Surgeon: Eldred Manges, MD;  Location: [facility];  Service: Orthopedics;  Laterality: Left;   tubes in ears      Social History   Socioeconomic History   Marital status: Single    Spouse name: Not on file   Number of children: Not on file   Years of education: 12   Highest education level: Not on file  Occupational History   Not on file  Tobacco Use   Smoking status: Never   Smokeless tobacco: Never  Vaping Use   Vaping status: Never Used  Substance and Sexual Activity   Alcohol use: No   Drug use: No   Sexual activity: Never    Birth  control/protection: None  Other Topics Concern   Not on file  Social History Narrative   Patient lives at home with her parents [place] and 43.   Patient has a high school certificate.   Right handed.   Caffeine coke cola sometimes.            Social Determinants of Health   Financial Resource Strain: Not on file  Food Insecurity: Not on file  Transportation Needs: Not on file  Physical Activity: Not on file  Stress: Not on file  Social Connections: Not on file    Family History  Problem Relation Age of Onset   Thyroid disease Mother    Hyperlipidemia Mother    Kidney Stones Father    Thyroid disease Maternal Grandmother    Other Maternal Grandmother        heart issue   Heart attack Maternal Grandfather    Diabetes Maternal Grandfather    Colon cancer Maternal Grandfather    Cancer Maternal Grandfather        prostate   Heart attack Paternal Grandmother     Outpatient Encounter Medications as of 08/05/2023  Medication Sig   amitriptyline (ELAVIL) 25 MG tablet Take 25 mg by mouth at bedtime.   BD PEN NEEDLE NANO 2ND GEN 32G X 4 MM MISC USE AS DIRECTED TO INJECT INSULIN TWICE DAILY   dicyclomine (BENTYL) 10 MG capsule Take 1 capsule (10 mg total) by mouth 3 (three) times daily as needed for spasms. (Patient taking differently: Take 10 mg by mouth every 8 (eight) hours as needed (stomach spasms.).)   gabapentin (NEURONTIN) 300 MG capsule Take 300 mg by mouth 3 (three) times daily.   glucose blood test strip See admin instructions.   hydrOXYzine (ATARAX/VISTARIL) 25 MG tablet Take 25 mg by mouth 2 (two) times daily as needed for itching or anxiety.   nystatin cream (MYCOSTATIN) APPLY TO AFFECTED AREA TWICE A DAY   pantoprazole (PROTONIX) 40 MG tablet TAKE 1 TABLET BY MOUTH DAILY BEFORE BREAKFAST   traMADol (ULTRAM) 50 MG tablet TAKE 1 TABLET BY MOUTH EVERY 12 HOURS AS NEEDED (Patient taking differently: Take 50 mg by mouth 2 (two) times daily.)   Vitamin D,  Ergocalciferol, (DRISDOL) 1.25 MG (50000 UNIT) CAPS capsule Take 1 capsule (50,000 Units total) by mouth every 7 (seven) days.   [DISCONTINUED] glucose blood (ACCU-CHEK AVIVA PLUS) test strip USE TO TEST BLOOD SUGAR TWICE A DAY AS DIRECTED BY DOCTOR 90   [DISCONTINUED] insulin aspart protamine - aspart (NOVOLOG MIX 70/30 FLEXPEN) (70-30) 100 UNIT/ML FlexPen Inject 30 Units into the skin 2 (two) times daily with a meal. 30 units with breakfast and 30 units with supper   [DISCONTINUED] levothyroxine (SYNTHROID) 150 MCG tablet Take 1 tablet (150 mcg total) by mouth daily before breakfast.   glucose blood (ACCU-CHEK AVIVA PLUS) test strip Use as instructed to monitor glucose 3 times daily   insulin aspart protamine - aspart (NOVOLOG MIX 70/30 FLEXPEN) (70-30) 100 UNIT/ML FlexPen Inject 40 Units into the skin 2 (two) times daily with a meal. 30 units with breakfast and 30 units with supper   levothyroxine (SYNTHROID) 150 MCG tablet Take 1 tablet (150 mcg total) by mouth daily before breakfast.   [DISCONTINUED] Continuous Blood Gluc Sensor (DEXCOM G7 SENSOR) MISC Inject 1 Application into the skin as directed. Change sensor every 10 days as directed. (Patient not taking: Reported on 04/01/2023)   No facility-administered encounter medications on file as of 08/05/2023.    ALLERGIES: Allergies  Allergen Reactions   5-Alpha Reductase Inhibitors    Canagliflozin Other (See Comments)    Severe yeast infections   Melatonin Other (See Comments)   Metformin Other (See Comments)   Metformin And Related     GI distress   Onglyza [Saxagliptin]     headaches   Statins Other (See Comments)   Zofran [Ondansetron Hcl] Other (See Comments)    Itchy rash in mouth   Cefaclor Rash    Rash   Penicillins Rash    Has patient had a PCN reaction causing immediate rash, facial/tongue/throat swelling, SOB or lightheadedness with hypotension: No Has patient had a PCN reaction causing severe rash involving mucus  membranes or skin necrosis: No Has patient had a PCN reaction that required hospitalization: No Has patient had a PCN reaction occurring within the last 10 years: Yes If all of the above answers are "NO", then may proceed with Cephalosporin use.    Pregabalin Itching and Rash   Sitagliptin Rash    VACCINATION STATUS: There is no immunization history for the selected administration types on file for this patient.  Diabetes She presents  for her follow-up diabetic visit. She has type 2 diabetes mellitus. Onset time: diagnosed at approx age of 80. Her disease course has been stable. There are no hypoglycemic associated symptoms. Pertinent negatives for hypoglycemia include no nervousness/anxiousness or tremors. There are no diabetic associated symptoms. Pertinent negatives for diabetes include no fatigue. There are no hypoglycemic complications. Symptoms are stable. Diabetic complications include peripheral neuropathy. Risk factors for coronary artery disease include diabetes mellitus and obesity. Current diabetic treatment includes insulin injections. She is compliant with treatment most of the time. Her weight is fluctuating minimally. She is following a generally healthy diet. When asked about meal planning, she reported none. She has not had a previous visit with a dietitian. She never participates in exercise. Her home blood glucose trend is fluctuating minimally. Her breakfast blood glucose range is generally 140-180 mg/dl. Her bedtime blood glucose range is generally >200 mg/dl. Her overall blood glucose range is 180-200 mg/dl. (She presents today, accompanied by her mother, with her meter, no logs, showing stable, slightly above target glycemic profile overall.  Her POCT A1c today is 7.7%, increasing from last visit of 7.4%.  She tried the Dexcom CGM but the sensor would not stay on for the full 10 days therefore she went back to fingersticks.  Analysis of her meter shows 7-day average of 182,  14-day average of 175, 30-day average of 175.  She denies any hypoglycemia.) An ACE inhibitor/angiotensin II receptor blocker is not being taken. She sees a podiatrist.Eye exam is current.  Hyperlipidemia This is a chronic problem. The current episode started more than 1 year ago. Exacerbating diseases include diabetes, hypothyroidism and obesity. Factors aggravating her hyperlipidemia include fatty foods. Current antihyperlipidemic treatment includes statins. Compliance problems include adherence to diet and adherence to exercise.  Risk factors for coronary artery disease include diabetes mellitus, dyslipidemia, family history, obesity and a sedentary lifestyle.  Thyroid Problem Presents for initial visit. Symptoms include weight gain. Patient reports no anxiety, cold intolerance, constipation, depressed mood, fatigue, hair loss, heat intolerance, leg swelling, palpitations or tremors. Past treatments include levothyroxine. The treatment provided moderate relief. Her past medical history is significant for diabetes, hyperlipidemia and obesity. Risk factors include family history of hypothyroidism.    Review of systems  Constitutional: + minimally fluctuating body weight,  current Body mass index is 55.46 kg/m. , no fatigue, no subjective hyperthermia, no subjective hypothermia Eyes: no blurry vision, no xerophthalmia ENT: no sore throat, no nodules palpated in throat, no dysphagia/odynophagia, no hoarseness Cardiovascular: no chest pain, no shortness of breath, no palpitations, no leg swelling Respiratory: no cough, no shortness of breath Gastrointestinal: no nausea/vomiting/diarrhea Musculoskeletal: no muscle/joint aches Skin: no rashes, no hyperemia Neurological: no tremors, no numbness, no tingling, no dizziness Psychiatric: no depression, no anxiety  Objective:     BP 115/66 (BP Location: Left Arm, Patient Position: Sitting, Cuff Size: Large)   Pulse 94   Ht 5\' 2"  (1.575 m)   Wt (!)  303 lb 3.2 oz (137.5 kg)   BMI 55.46 kg/m   Wt Readings from Last 3 Encounters:  08/05/23 (!) 303 lb 3.2 oz (137.5 kg)  07/08/23 297 lb (134.7 kg)  04/01/23 297 lb (134.7 kg)     BP Readings from Last 3 Encounters:  08/05/23 115/66  07/08/23 107/64  04/01/23 113/74      Physical Exam- Limited  Constitutional:  Body mass index is 55.46 kg/m. , not in acute distress, normal state of mind Eyes:  EOMI, no exophthalmos Musculoskeletal: no  gross deformities, strength intact in all four extremities, no gross restriction of joint movements Skin:  no rashes, no hyperemia Neurological: no tremor with outstretched hands    Diabetic Foot Exam - Simple   No data filed     CMP ( most recent) CMP     Component Value Date/Time   NA 138 07/03/2020 1438   K 4.4 07/03/2020 1438   CL 97 (L) 07/03/2020 1438   CO2 29 07/03/2020 1438   GLUCOSE 170 (H) 07/03/2020 1438   BUN 9 07/03/2020 1438   CREATININE 0.72 07/03/2020 1438   CALCIUM 9.6 07/03/2020 1438   PROT 6.8 10/18/2018 0458   ALBUMIN 3.2 (L) 10/18/2018 0458   AST 18 10/18/2018 0458   ALT 19 10/18/2018 0458   ALKPHOS 56 10/18/2018 0458   BILITOT 0.9 10/18/2018 0458   GFRNONAA >60 07/03/2020 1438   GFRAA >60 07/03/2020 1438     Diabetic Labs (most recent): Lab Results  Component Value Date   HGBA1C 7.7 (A) 08/05/2023   HGBA1C 7.4 (A) 04/01/2023   HGBA1C 7.7 (A) 11/26/2022     Lipid Panel ( most recent) Lipid Panel  No results found for: "CHOL", "TRIG", "HDL", "CHOLHDL", "VLDL", "LDLCALC", "LDLDIRECT", "LABVLDL"    Lab Results  Component Value Date   TSH 1.280 03/25/2023   TSH 0.760 01/21/2023   TSH 0.679 11/16/2022   TSH 0.578 12/25/2021   TSH 0.033 (L) 09/04/2021   TSH 1.812 10/17/2018   TSH 5.154 (H) 10/03/2013   FREET4 1.24 03/25/2023   FREET4 1.26 11/16/2022   FREET4 1.32 12/25/2021   FREET4 1.68 09/04/2021           Assessment & Plan:   1) Uncontrolled type 2 diabetes with long term insulin  therapy  She presents today, accompanied by her mother, with her meter, no logs, showing stable, slightly above target glycemic profile overall.  Her POCT A1c today is 7.7%, increasing from last visit of 7.4%.  She tried the Dexcom CGM but the sensor would not stay on for the full 10 days therefore she went back to fingersticks.  Analysis of her meter shows 7-day average of 182, 14-day average of 175, 30-day average of 175.  She denies any hypoglycemia.  - Alexandra Kelley has currently uncontrolled symptomatic type 2 DM since 42 years of age.   -Recent labs reviewed.  - I had a long discussion with her about the progressive nature of diabetes and the pathology behind its complications. -her diabetes is complicated by peripheral neuropathy and she remains at a high risk for more acute and chronic complications which include CAD, CVA, CKD, retinopathy, and neuropathy. These are all discussed in detail with her.  - Nutritional counseling repeated at each appointment due to patients tendency to fall back in to old habits.  - The patient admits there is a room for improvement in their diet and drink choices. -  Suggestion is made for the patient to avoid simple carbohydrates from their diet including Cakes, Sweet Desserts / Pastries, Ice Cream, Soda (diet and regular), Sweet Tea, Candies, Chips, Cookies, Sweet Pastries, Store Bought Juices, Alcohol in Excess of 1-2 drinks a day, Artificial Sweeteners, Coffee Creamer, and "Sugar-free" Products. This will help patient to have stable blood glucose profile and potentially avoid unintended weight gain.   - I encouraged the patient to switch to unprocessed or minimally processed complex starch and increased protein intake (animal or plant source), fruits, and vegetables.   - Patient is advised to  stick to a routine mealtimes to eat 3 meals a day and avoid unnecessary snacks (to snack only to correct hypoglycemia).  - she will be scheduled with Norm Salt, RDN, CDE for diabetes education.  - I have approached her with the following individualized plan to manage her diabetes and patient agrees:   -She is advised to increase her premixed insulin 70/30  35 units with breakfast and 30 units with supper if glucose is above 90 and she is eating.    -she is encouraged to continue monitoring blood glucose 3 times daily, before injecting insulin and before bed, and to call the clinic if she has readings less than 70 or above 200 for 3 tests in a row.  She could benefit from CGM device.  I did give her sample of Skin Tac wipe to see if it helps the sensor stay on and gave her advise to use Flonase on the site prior to putting sensor on to avoid any irritation to the skin.  - she is warned not to take insulin without proper monitoring per orders. - Adjustment parameters are given to her for hypo and hyperglycemia in writing.  - she will be considered for incretin therapy as appropriate next visit.  - Specific targets for  A1c; LDL, HDL, and Triglycerides were discussed with the patient.  2) Blood Pressure /Hypertension:  her blood pressure is controlled to target without the use of antihypertensives.    3) Lipids/Hyperlipidemia:    Her recent lipid panel from 02/24/22 shows uncontrolled LDL of 127 and elevated triglycerides of 172.   she is advised to continue Crestor 40 mg daily at bedtime.  Side effects and precautions discussed with her.    4)  Weight/Diet:  her Body mass index is 55.46 kg/m.  -  clearly complicating her diabetes care.   she is a candidate for weight loss. I discussed with her the fact that loss of 5 - 10% of her  current body weight will have the most impact on her diabetes management.  Exercise, and detailed carbohydrates information provided  -  detailed on discharge instructions.  5) Hypothyroidism-unspecified There are no recent TFTs to review.   She is advised to continue Levothyroxine 150 mcg po daily before  breakfast. Will recheck prior to next visit and adjust dosage accordingly.   - The correct intake of thyroid hormone (Levothyroxine, Synthroid), is on empty stomach first thing in the morning, with water, separated by at least 30 minutes from breakfast and other medications,  and separated by more than 4 hours from calcium, iron, multivitamins, acid reflux medications (PPIs).  - This medication is a life-long medication and will be needed to correct thyroid hormone imbalances for the rest of your life.  The dose may change from time to time, based on thyroid blood work.  - It is extremely important to be consistent taking this medication, near the same time each morning.  -AVOID TAKING PRODUCTS CONTAINING BIOTIN (commonly found in Hair, Skin, Nails vitamins) AS IT INTERFERES WITH THE VALIDITY OF THYROID FUNCTION BLOOD TESTS.  6) Vitamin D deficiency Her recent vitamin d level from 11/16/22 was 15.  She could benefit from replenishment with Ergocalciferol 50000 units po weekly x 12 weeks.  I did give her additional refills as I think she will need it for prolonged period of time.  7) Chronic Care/Health Maintenance: -she is not on ACEI/ARB and is on Statin medications and is encouraged to initiate and continue to follow up  with Ophthalmology, Dentist, Podiatrist at least yearly or according to recommendations, and advised to stay away from smoking. I have recommended yearly flu vaccine and pneumonia vaccine at least every 5 years; moderate intensity exercise for up to 150 minutes weekly; and sleep for at least 7 hours a day.  - she is advised to maintain close follow up with Marylynn Pearson, FNP for primary care needs, as well as her other providers for optimal and coordinated care.     I spent  30  minutes in the care of the patient today including review of labs from CMP, Lipids, Thyroid Function, Hematology (current and previous including abstractions from other facilities); face-to-face time  discussing  her blood glucose readings/logs, discussing hypoglycemia and hyperglycemia episodes and symptoms, medications doses, her options of short and long term treatment based on the latest standards of care / guidelines;  discussion about incorporating lifestyle medicine;  and documenting the encounter. Risk reduction counseling performed per USPSTF guidelines to reduce obesity and cardiovascular risk factors.     Please refer to Patient Instructions for Blood Glucose Monitoring and Insulin/Medications Dosing Guide"  in media tab for additional information. Please  also refer to " Patient Self Inventory" in the Media  tab for reviewed elements of pertinent patient history.  Alexandra Kelley participated in the discussions, expressed understanding, and voiced agreement with the above plans.  All questions were answered to her satisfaction. she is encouraged to contact clinic should she have any questions or concerns prior to her return visit.     Follow up plan: - Return in about 4 months (around 12/06/2023) for Diabetes F/U with A1c in office, Previsit labs, Bring meter and logs.   Ronny Bacon, [facility] [address] [place], [place] [address] Phone: [phone] Fax: [phone]  08/05/2023, 3:53 PM

## 2023-09-09 ENCOUNTER — Telehealth: Payer: Self-pay | Admitting: *Deleted

## 2023-09-09 NOTE — Telephone Encounter (Signed)
Alexandra Kelley had also ask about when is the best time for [name] to administer the insulin. She has been administering before she eats, She is wondering if she should wait until after she has ate something. She is also wondering if she should call her Neurologist, as this looked like a seizure to her?

## 2023-09-09 NOTE — Telephone Encounter (Signed)
Called and talked with [name], gave her [name]'s recommendation.

## 2023-09-09 NOTE — Telephone Encounter (Signed)
That is not a typical response to insulin and she has done well on this premixed insulin in the past, therefore I doubt they are related.  I would recommend going to urgent care or ED for further evaluation.  Don't want to miss anything else that could have been the trigger and her have another one.

## 2023-09-09 NOTE — Telephone Encounter (Signed)
Questions possible seizure after insulin injection this morning.  Alexandra Kelley shares that after Gretel injected her insulin this morning,her stomach tightened, she started shaking,eyes set. Driana Dazey thought that she was going to have to called 911, but she shook her and she started responding.  I did call Brynley Cuddeback, and she says that right now Alexandra Kelley is okay. She would like to know your thoughts.

## 2023-09-09 NOTE — Telephone Encounter (Signed)
Yes I would call the neurologist to discuss for sure.  If she has another one, she will certainly need to be checked out in the ED while she waits for an appt at the neurologist.   She should be taking her injection about 10 minutes prior to eating to allow enough time for the insulin to work so that it can help prevent the spike in glucose after eating.

## 2023-09-27 ENCOUNTER — Other Ambulatory Visit: Payer: Self-pay | Admitting: Adult Health

## 2023-10-08 LAB — COMPREHENSIVE METABOLIC PANEL
ALT: 20 [IU]/L (ref 0–32)
AST: 23 [IU]/L (ref 0–40)
Albumin: 4 g/dL (ref 3.9–4.9)
Alkaline Phosphatase: 89 [IU]/L (ref 44–121)
BUN/Creatinine Ratio: 12 (ref 9–23)
BUN: 10 mg/dL (ref 6–24)
Bilirubin Total: 0.4 mg/dL (ref 0.0–1.2)
CO2: 26 mmol/L (ref 20–29)
Calcium: 9.5 mg/dL (ref 8.7–10.2)
Chloride: 100 mmol/L (ref 96–106)
Creatinine, Ser: 0.81 mg/dL (ref 0.57–1.00)
Globulin, Total: 3.1 g/dL (ref 1.5–4.5)
Glucose: 211 mg/dL — ABNORMAL HIGH (ref 70–99)
Potassium: 4.5 mmol/L (ref 3.5–5.2)
Sodium: 140 mmol/L (ref 134–144)
Total Protein: 7.1 g/dL (ref 6.0–8.5)
eGFR: 94 mL/min/{1.73_m2} (ref >59)

## 2023-10-08 LAB — TSH: TSH: 3.11 u[IU]/mL (ref 0.450–4.500)

## 2023-10-08 LAB — T4, FREE: Free T4: 1.27 ng/dL (ref 0.82–1.77)

## 2023-11-29 ENCOUNTER — Telehealth: Payer: Self-pay | Admitting: Nurse Practitioner

## 2023-11-29 NOTE — Telephone Encounter (Signed)
That's fine, I'll accept those labs for her upcoming appt.

## 2023-11-29 NOTE — Telephone Encounter (Signed)
Pts mom stated that they were told labs could be done anytime so. Labs were done in December.

## 2023-12-09 ENCOUNTER — Ambulatory Visit: Payer: Medicaid Other | Admitting: Nurse Practitioner

## 2023-12-09 ENCOUNTER — Encounter: Payer: Self-pay | Admitting: Nurse Practitioner

## 2023-12-09 VITALS — BP 90/60 | HR 107 | Ht 62.0 in | Wt 301.0 lb

## 2023-12-09 DIAGNOSIS — Z794 Long term (current) use of insulin: Secondary | ICD-10-CM | POA: Diagnosis not present

## 2023-12-09 DIAGNOSIS — E039 Hypothyroidism, unspecified: Secondary | ICD-10-CM

## 2023-12-09 DIAGNOSIS — I10 Essential (primary) hypertension: Secondary | ICD-10-CM

## 2023-12-09 DIAGNOSIS — E119 Type 2 diabetes mellitus without complications: Secondary | ICD-10-CM | POA: Diagnosis not present

## 2023-12-09 DIAGNOSIS — E782 Mixed hyperlipidemia: Secondary | ICD-10-CM

## 2023-12-09 NOTE — Patient Instructions (Signed)

## 2023-12-09 NOTE — Progress Notes (Signed)
 Endocrinology Follow Up Note       12/09/2023, 4:14 PM   Subjective:    Patient ID: Alexandra Kelley, female    DOB: 28-Oct-1981.  Alexandra Kelley is being seen in follow up after being seen in consultation for management of currently uncontrolled symptomatic diabetes and hypothyroidism requested by  Vick Lurie, FNP.   Past Medical History:  Diagnosis Date   Arthritis    back   Carpal tunnel syndrome    both wrists   Diabetes mellitus without complication (HCC)    Dr. Adel   GERD (gastroesophageal reflux disease)    History of kidney stones    Hyperlipidemia    Hypothyroidism    Kidney stone    Neuropathy    feet and hands   Thyroid  disease     Past Surgical History:  Procedure Laterality Date   BACK SURGERY  1990s   CYSTOSCOPY/URETEROSCOPY/HOLMIUM LASER/STENT PLACEMENT Right 06/20/2020   Procedure: CYSTOSCOPY, RIGHT RETROGRADE /STENT PLACEMENT;  Surgeon: Watt Rush, MD;  Location: [facility];  Service: Urology;  Laterality: Right;   CYSTOSCOPY/URETEROSCOPY/HOLMIUM LASER/STENT PLACEMENT Right 07/11/2020   Procedure: CYSTOSCOPY RIGHT URETEROSCOPY/HOLMIUM LASER/STENT EXCHANGE;  Surgeon: Watt Rush, MD;  Location: [facility];  Service: Urology;  Laterality: Right;   FEMUR IM NAIL Left 10/03/2013   Procedure: INTRAMEDULLARY (IM) RETROGRADE FEMORAL NAILING;  Surgeon: Oneil JAYSON Herald, MD;  Location: [facility];  Service: Orthopedics;  Laterality: Left;   tubes in ears      Social History   Socioeconomic History   Marital status: Single    Spouse name: Not on file   Number of children: Not on file   Years of education: 12   Highest education level: Not on file  Occupational History   Not on file  Tobacco Use   Smoking status: Never   Smokeless tobacco: Never  Vaping Use   Vaping status: Never Used  Substance and Sexual Activity   Alcohol use: No   Drug use: No   Sexual activity: Never    Birth  control/protection: None  Other Topics Concern   Not on file  Social History Narrative   Patient lives at home with her parents Alexandra Kelley and 32.   Patient has a high school certificate.   Right handed.   Caffeine coke cola sometimes.            Social Drivers of Corporate Investment Banker Strain: Not on file  Food Insecurity: Not on file  Transportation Needs: Not on file  Physical Activity: Not on file  Stress: Not on file  Social Connections: Not on file    Family History  Problem Relation Age of Onset   Thyroid  disease Mother    Hyperlipidemia Mother    Kidney Stones Father    Thyroid  disease Maternal Grandmother    Other Maternal Grandmother        heart issue   Heart attack Maternal Grandfather    Diabetes Maternal Grandfather    Colon cancer Maternal Grandfather    Cancer Maternal Grandfather        prostate   Heart attack Paternal Grandmother     Outpatient Encounter Medications as of 12/09/2023  Medication Sig   amitriptyline  (ELAVIL ) 25 MG tablet Take 25 mg by mouth at bedtime.   BD PEN NEEDLE NANO 2ND GEN 32G X 4 MM MISC USE AS DIRECTED TO INJECT INSULIN  TWICE DAILY   dicyclomine  (BENTYL ) 10 MG capsule Take 1 capsule (10 mg total) by mouth 3 (three) times daily as needed for spasms. (Patient taking differently: Take 10 mg by mouth every 8 (eight) hours as needed (stomach spasms.).)   gabapentin  (NEURONTIN ) 300 MG capsule Take 300 mg by mouth 3 (three) times daily.   glucose blood (ACCU-CHEK AVIVA PLUS) test strip Use as instructed to monitor glucose 3 times daily   glucose blood test strip See admin instructions.   hydrOXYzine (ATARAX/VISTARIL) 25 MG tablet Take 25 mg by mouth 2 (two) times daily as needed for itching or anxiety.   insulin  aspart protamine - aspart (NOVOLOG  MIX 70/30 FLEXPEN) (70-30) 100 UNIT/ML FlexPen Inject 40 Units into the skin 2 (two) times daily with a meal. 30 units with breakfast and 30 units with supper   levothyroxine  (SYNTHROID )  150 MCG tablet Take 1 tablet (150 mcg total) by mouth daily before breakfast.   nystatin  cream (MYCOSTATIN ) APPLY TO AFFECTED AREA TWICE A DAY   pantoprazole  (PROTONIX ) 40 MG tablet TAKE 1 TABLET BY MOUTH DAILY BEFORE BREAKFAST   traMADol  (ULTRAM ) 50 MG tablet TAKE 1 TABLET BY MOUTH EVERY 12 HOURS AS NEEDED (Patient taking differently: Take 50 mg by mouth 2 (two) times daily.)   Vitamin D , Ergocalciferol , (DRISDOL ) 1.25 MG (50000 UNIT) CAPS capsule Take 1 capsule (50,000 Units total) by mouth every 7 (seven) days.   No facility-administered encounter medications on file as of 12/09/2023.    ALLERGIES: Allergies  Allergen Reactions   5-Alpha Reductase Inhibitors    Canagliflozin Other (See Comments)    Severe yeast infections   Melatonin Other (See Comments)   Metformin  Other (See Comments)   Metformin  And Related     GI distress   Onglyza [Saxagliptin]     headaches   Statins Other (See Comments)   Zofran  [Ondansetron  Hcl] Other (See Comments)    Itchy rash in mouth   Cefaclor Rash    Rash   Penicillins Rash    Has patient had a PCN reaction causing immediate rash, facial/tongue/throat swelling, SOB or lightheadedness with hypotension: No Has patient had a PCN reaction causing severe rash involving mucus membranes or skin necrosis: No Has patient had a PCN reaction that required hospitalization: No Has patient had a PCN reaction occurring within the last 10 years: Yes If all of the above answers are NO, then may proceed with Cephalosporin use.    Pregabalin  Itching and Rash   Sitagliptin Rash    VACCINATION STATUS: There is no immunization history for the selected administration types on file for this patient.  Diabetes She presents for her follow-up diabetic visit. She has type 2 diabetes mellitus. Onset time: diagnosed at approx age of 51. Her disease course has been stable. There are no hypoglycemic associated symptoms. Pertinent negatives for hypoglycemia include no  nervousness/anxiousness or tremors. There are no diabetic associated symptoms. Pertinent negatives for diabetes include no fatigue. There are no hypoglycemic complications. Symptoms are stable. Diabetic complications include peripheral neuropathy. Risk factors for coronary artery disease include diabetes mellitus and obesity. Current diabetic treatment includes insulin  injections. She is compliant with treatment most of the time. Her weight is fluctuating minimally. She is following a generally healthy diet. When asked about meal planning, she reported none.  She has not had a previous visit with a dietitian. She never participates in exercise. Her home blood glucose trend is fluctuating minimally. Her overall blood glucose range is 140-180 mg/dl. (She presents today, accompanied by her mother, with her meter, no logs, showing stable, slightly above target glycemic profile overall.  Her most recent A1c on 11/10/22 was 7.9%, increasing slightly from last visit of 7.7%.  She notes she did have 2 episodes where after she took her insulin  (about 15 minutes or sooner) fell unconscious where EMS had to be called.  It was not for sure to be caused by hypoglycemia.  Her mom notes she had eating pork product right before the incident both times.  Since then she has tried avoiding it.  She has neurology appt coming up, thinking it may have been related to a seizure.  Analysis of her meter shows 7-day average of 144, 14-day average of 146, 30-day average of 159.) An ACE inhibitor/angiotensin II receptor blocker is not being taken. She sees a podiatrist.Eye exam is current.  Hyperlipidemia This is a chronic problem. The current episode started more than 1 year ago. Exacerbating diseases include diabetes, hypothyroidism and obesity. Factors aggravating her hyperlipidemia include fatty foods. Current antihyperlipidemic treatment includes statins. Compliance problems include adherence to diet and adherence to exercise.  Risk  factors for coronary artery disease include diabetes mellitus, dyslipidemia, family history, obesity and a sedentary lifestyle.  Thyroid  Problem Presents for initial visit. Symptoms include weight gain. Patient reports no anxiety, cold intolerance, constipation, depressed mood, fatigue, hair loss, heat intolerance, leg swelling, palpitations or tremors. Past treatments include levothyroxine . The treatment provided moderate relief. Her past medical history is significant for diabetes, hyperlipidemia and obesity. Risk factors include family history of hypothyroidism.    Review of systems  Constitutional: + minimally fluctuating body weight,  current Body mass index is 55.05 kg/m. , no fatigue, no subjective hyperthermia, + subjective hypothermia Eyes: no blurry vision, no xerophthalmia ENT: no sore throat, no nodules palpated in throat, no dysphagia/odynophagia, no hoarseness Cardiovascular: no chest pain, no shortness of breath, no palpitations, no leg swelling Respiratory: no cough, no shortness of breath Gastrointestinal: no nausea/vomiting/diarrhea Musculoskeletal: no muscle/joint aches Skin: no rashes, no hyperemia Neurological: no tremors, no numbness, no tingling, no dizziness Psychiatric: no depression, no anxiety  Objective:     BP 90/60 (BP Location: Left Arm, Patient Position: Sitting, Cuff Size: Large)   Pulse (!) 107   Ht 5' 2 (1.575 m)   Wt (!) 301 lb (136.5 kg)   BMI 55.05 kg/m   Wt Readings from Last 3 Encounters:  12/09/23 (!) 301 lb (136.5 kg)  08/05/23 (!) 303 lb 3.2 oz (137.5 kg)  07/08/23 297 lb (134.7 kg)     BP Readings from Last 3 Encounters:  12/09/23 90/60  08/05/23 115/66  07/08/23 107/64      Physical Exam- Limited  Constitutional:  Body mass index is 55.05 kg/m. , not in acute distress, normal state of mind Eyes:  EOMI, no exophthalmos Musculoskeletal: no gross deformities, strength intact in all four extremities, no gross restriction of joint  movements Skin:  no rashes, no hyperemia Neurological: no tremor with outstretched hands    Diabetic Foot Exam - Simple   No data filed     CMP ( most recent) CMP     Component Value Date/Time   NA 140 10/07/2023 1602   K 4.5 10/07/2023 1602   CL 100 10/07/2023 1602   CO2 26 10/07/2023 1602  GLUCOSE 211 (H) 10/07/2023 1602   GLUCOSE 170 (H) 07/03/2020 1438   BUN 10 10/07/2023 1602   CREATININE 0.81 10/07/2023 1602   CALCIUM 9.5 10/07/2023 1602   PROT 7.1 10/07/2023 1602   ALBUMIN 4.0 10/07/2023 1602   AST 23 10/07/2023 1602   ALT 20 10/07/2023 1602   ALKPHOS 89 10/07/2023 1602   BILITOT 0.4 10/07/2023 1602   GFRNONAA >60 07/03/2020 1438   GFRAA >60 07/03/2020 1438     Diabetic Labs (most recent): Lab Results  Component Value Date   HGBA1C 7.7 (A) 08/05/2023   HGBA1C 7.4 (A) 04/01/2023   HGBA1C 7.7 (A) 11/26/2022     Lipid Panel ( most recent) Lipid Panel  No results found for: CHOL, TRIG, HDL, CHOLHDL, VLDL, LDLCALC, LDLDIRECT, LABVLDL    Lab Results  Component Value Date   TSH 3.110 10/07/2023   TSH 1.280 03/25/2023   TSH 0.760 01/21/2023   TSH 0.679 11/16/2022   TSH 0.578 12/25/2021   TSH 0.033 (L) 09/04/2021   TSH 1.812 10/17/2018   TSH 5.154 (H) 10/03/2013   FREET4 1.27 10/07/2023   FREET4 1.24 03/25/2023   FREET4 1.26 11/16/2022   FREET4 1.32 12/25/2021   FREET4 1.68 09/04/2021           Assessment & Plan:   1) Uncontrolled type 2 diabetes with long term insulin  therapy  She presents today, accompanied by her mother, with her meter, no logs, showing stable, slightly above target glycemic profile overall.  Her most recent A1c on 11/10/22 was 7.9%, increasing slightly from last visit of 7.7%.  She notes she did have 2 episodes where after she took her insulin  (about 15 minutes or sooner) fell unconscious where EMS had to be called.  It was not for sure to be caused by hypoglycemia.  Her mom notes she had eating pork product  right before the incident both times.  Since then she has tried avoiding it.  She has neurology appt coming up, thinking it may have been related to a seizure.  Analysis of her meter shows 7-day average of 144, 14-day average of 146, 30-day average of 159.  - Adella A Boothe has currently uncontrolled symptomatic type 2 DM since 43 years of age.   -Recent labs reviewed.  - I had a long discussion with her about the progressive nature of diabetes and the pathology behind its complications. -her diabetes is complicated by peripheral neuropathy and she remains at a high risk for more acute and chronic complications which include CAD, CVA, CKD, retinopathy, and neuropathy. These are all discussed in detail with her.  - Nutritional counseling repeated at each appointment due to patients tendency to fall back in to old habits.  - The patient admits there is a room for improvement in their diet and drink choices. -  Suggestion is made for the patient to avoid simple carbohydrates from their diet including Cakes, Sweet Desserts / Pastries, Ice Cream, Soda (diet and regular), Sweet Tea, Candies, Chips, Cookies, Sweet Pastries, Store Bought Juices, Alcohol in Excess of 1-2 drinks a day, Artificial Sweeteners, Coffee Creamer, and Sugar-free Products. This will help patient to have stable blood glucose profile and potentially avoid unintended weight gain.   - I encouraged the patient to switch to unprocessed or minimally processed complex starch and increased protein intake (animal or plant source), fruits, and vegetables.   - Patient is advised to stick to a routine mealtimes to eat 3 meals a day and avoid unnecessary snacks (  to snack only to correct hypoglycemia).  - she will be scheduled with Penny Crumpton, RDN, CDE for diabetes education.  - I have approached her with the following individualized plan to manage her diabetes and patient agrees:   -She is advised to continue her premixed insulin   70/30 30 units with breakfast with supper if glucose is above 90 and she is eating.    -she is encouraged to continue monitoring blood glucose 3 times daily, before injecting insulin  and before bed, and to call the clinic if she has readings less than 70 or above 200 for 3 tests in a row.  She could benefit from CGM device.  I did give her sample of Skin Tac wipe to see if it helps the sensor stay on and gave her advise to use Flonase  on the site prior to putting sensor on to avoid any irritation to the skin.  She did not like the device and reverted back to fingersticks.  - she is warned not to take insulin  without proper monitoring per orders. - Adjustment parameters are given to her for hypo and hyperglycemia in writing.  - she will be considered for incretin therapy as appropriate next visit.  - Specific targets for  A1c; LDL, HDL, and Triglycerides were discussed with the patient.  2) Blood Pressure /Hypertension:  her blood pressure is controlled to target without the use of antihypertensives.    3) Lipids/Hyperlipidemia:    Her recent lipid panel from 02/24/22 shows uncontrolled LDL of 127 and elevated triglycerides of 172.   she is advised to continue Crestor 40 mg daily at bedtime.  Side effects and precautions discussed with her.    4)  Weight/Diet:  her Body mass index is 55.05 kg/m.  -  clearly complicating her diabetes care.   she is a candidate for weight loss. I discussed with her the fact that loss of 5 - 10% of her  current body weight will have the most impact on her diabetes management.  Exercise, and detailed carbohydrates information provided  -  detailed on discharge instructions.  5) Hypothyroidism-unspecified Her previsit TFTs are consistent with appropriate hormone replacement.   She is advised to continue Levothyroxine  150 mcg po daily before breakfast.    - The correct intake of thyroid  hormone (Levothyroxine , Synthroid ), is on empty stomach first thing in the  morning, with water , separated by at least 30 minutes from breakfast and other medications,  and separated by more than 4 hours from calcium, iron, multivitamins, acid reflux medications (PPIs).  - This medication is a life-long medication and will be needed to correct thyroid  hormone imbalances for the rest of your life.  The dose may change from time to time, based on thyroid  blood work.  - It is extremely important to be consistent taking this medication, near the same time each morning.  -AVOID TAKING PRODUCTS CONTAINING BIOTIN (commonly found in Hair, Skin, Nails vitamins) AS IT INTERFERES WITH THE VALIDITY OF THYROID  FUNCTION BLOOD TESTS.  6) Vitamin D  deficiency Her recent vitamin d  level from 11/16/22 was 15.  She could benefit from replenishment with Ergocalciferol  50000 units po weekly x 12 weeks.  I did give her additional refills as I think she will need it for prolonged period of time.  7) Chronic Care/Health Maintenance: -she is not on ACEI/ARB and is on Statin medications and is encouraged to initiate and continue to follow up with Ophthalmology, Dentist, Podiatrist at least yearly or according to recommendations, and advised to  stay away from smoking. I have recommended yearly flu vaccine and pneumonia vaccine at least every 5 years; moderate intensity exercise for up to 150 minutes weekly; and sleep for at least 7 hours a day.  - she is advised to maintain close follow up with [place], Tenika, FNP for primary care needs, as well as her other providers for optimal and coordinated care.     I spent  39  minutes in the care of the patient today including review of labs from CMP, Lipids, Thyroid  Function, Hematology (current and previous including abstractions from other facilities); face-to-face time discussing  her blood glucose readings/logs, discussing hypoglycemia and hyperglycemia episodes and symptoms, medications doses, her options of short and long term treatment based on the  latest standards of care / guidelines;  discussion about incorporating lifestyle medicine;  and documenting the encounter. Risk reduction counseling performed per USPSTF guidelines to reduce obesity and cardiovascular risk factors.     Please refer to Patient Instructions for Blood Glucose Monitoring and Insulin /Medications Dosing Guide  in media tab for additional information. Please  also refer to  Patient Self Inventory in the Media  tab for reviewed elements of pertinent patient history.  Kijana A Shell participated in the discussions, expressed understanding, and voiced agreement with the above plans.  All questions were answered to her satisfaction. she is encouraged to contact clinic should she have any questions or concerns prior to her return visit.     Follow up plan: - Return in about 4 months (around 04/07/2024) for Diabetes F/U with A1c in office, No previsit labs, Bring meter and logs.   Benton Rio, [facility] [address] [place], [place] [address] Phone: [phone] Fax: [phone]  12/09/2023, 4:14 PM

## 2023-12-13 ENCOUNTER — Telehealth: Payer: Self-pay | Admitting: *Deleted

## 2023-12-13 NOTE — Telephone Encounter (Signed)
 Alexandra Kelley, Alexandra Kelley left a message that her blood sugars have been low the last two mornings. Yesterday it was 96 , this morning barely over 100. Her morning insulins have been held. Alexandra Kelley is asking if you may want to lower the dose? Novolog  70/30 40 units bid.

## 2023-12-13 NOTE — Telephone Encounter (Signed)
 Keep breakfast dose at 40 units and lower dinner dose to 30 units for now.  See if that helps her avoid lows in the morning while providing some control during the afternoon.

## 2023-12-13 NOTE — Telephone Encounter (Signed)
 Pt's mother is asking for a call back

## 2023-12-14 NOTE — Telephone Encounter (Signed)
Talked with Alexandra Kelley, Alexandra Kelley's mama. She states that she is already taking the 30 units twice a day . Talked with Alexandra Kelley. Patient is to inject 30 units in the morning and 20 units at night. Alexandra Kelley was made aware.

## 2023-12-31 ENCOUNTER — Ambulatory Visit (HOSPITAL_COMMUNITY): Payer: Medicaid Other

## 2024-01-04 ENCOUNTER — Ambulatory Visit (HOSPITAL_COMMUNITY): Payer: Medicaid Other

## 2024-01-06 ENCOUNTER — Ambulatory Visit: Payer: Medicaid Other | Admitting: Urology

## 2024-01-27 ENCOUNTER — Encounter: Payer: Self-pay | Admitting: Neurology

## 2024-01-27 ENCOUNTER — Ambulatory Visit: Payer: Medicaid Other | Admitting: Neurology

## 2024-01-27 VITALS — BP 122/76 | HR 99 | Wt 303.0 lb

## 2024-01-27 DIAGNOSIS — R6250 Unspecified lack of expected normal physiological development in childhood: Secondary | ICD-10-CM | POA: Insufficient documentation

## 2024-01-27 DIAGNOSIS — R569 Unspecified convulsions: Secondary | ICD-10-CM

## 2024-01-27 MED ORDER — ALPRAZOLAM 0.5 MG PO TABS: ORAL_TABLET | ORAL | 0 refills | Status: DC

## 2024-01-27 NOTE — Progress Notes (Signed)
 Chief Complaint  Patient presents with   New Patient (Initial Visit)    Rm 15, with mom, has neuropathy and sz like activity.  Last sz episode early march 2025, happening after giving insulin.      ASSESSMENT AND PLAN  Alexandra Kelley is a 43 y.o. female   Seizure-like activity Obesity Developmentally delayed  Document all events  Complete evaluation with MRI of the brain with without contrast  EEG    DIAGNOSTIC DATA (LABS, IMAGING, TESTING) - I reviewed patient records, labs, notes, testing and imaging myself where available.   MEDICAL HISTORY:  Alexandra Kelley is a 43 year old female, accompanied by her mother, seen in request by her primary care from [facility], [place], for evaluation of seizure-like activity, initial evaluation was on January 27, 2024  History is obtained from the patient and review of electronic medical records. I personally reviewed pertinent available imaging films in PACS.   PMHx of  Obesity DM Mental Retardation,  She had few episode of passing out since November  First episode was in November 2024, after she gave herself insulin shots, her parents helped her diarrhea the prefilled insulin syringe, she did do the subcutaneous injection herself, shortly after the injection, she reported that she not feeling well, mother described stare,then started body shaking, lasting for 2 minutes,  Second episode in December 2022, 9 AM again after insulin shots, she was going to her bathroom, mother heard the thump, she passed out facing down, with no seizure activity noted, she did have urinary incontinence, EMS was called, she came around in few minutes, per mother recall, glucose level was 163 by EMS, vitals were fine  Third episode March 2025, she was sitting at the table around 7 PM, 1 hour after she eat her dinner, before injection her glucose level was 150, then mother tired 20 units 70/30 NovoLog insulin, shortly after injection, she  reported feeling weird, flushing in her face, body shivering, she was able to interact with her parents, symptoms improved after she drinks some water, lasting for 30 minutes  She had long history of obesity, diabetes, mental retardation, I saw her in 2015 for gait abnormality  She was was born full-term, is the only child of her family, was able to special education, had a high school graduate, at baseline, she ambulate without assistance, able to help with simple house chores.  She fell, broke her left femur in December 2014, x-ray showed comminuted predominantly oblique fracture through the mid and distal  left femoral shaft. She underwent retrograde femoral nail with interlocks. By Dr. Ronnald Nian in October 03 2013 under general anesthesia, plus local anesthesia   She was discharged to home in October 20 2013, able to ambulate with walker, but denies significant left femur, or left hip pain,   Around January 19th 2015, she began to complains of bilateral feet, lower extremity paresthesia, burning pain, from knee down, worsening gait difficulty, could not pick up her right foot off the floor,   Bilateral lower extremity arterial Doppler study in a poor 6 2016 showed no evidence of hemodynamic vessel occlusive disease at bilateral lower extremity, elevated ABIs bilaterally likely: secondary to distal small vessel calcification, most commonly attributed to her previous history of diabetes    EMG/NCS showed active denervation at bilateral lower extremities, involving bilateral L4,5,S1 myotomes, more of right L4-5, left S1, but no active denervation at bilateral lumbar paraspinal muscles, with absent bilateral peroneal sensory responses, above findings support diabetic  amyotrophy.     MRI lumbar,   :   1. At L5-S1: far left lateral disc protrusion with potential impingement upon the exiting left L5 root; mild-moderate left foraminal stenosis.   2. At L4-5: posterior central disc protrusion (4mm); no  spinal stenosis or foraminal narrowing.   MRI cervical: Disc bulging at C4-5 and C7-T1.  No spinal stenosis or foraminal narrowing. No intrinsic or compressive spinal cord lesions. MRI thoracic showed no significant stenosis She denies bowel and bladder incontinence.   Her bilateral lower extremity paresthesia, weakness took long time eventually recovered, she now ambulate without assistant, denies significant lower extremity paresthesia, no bowel or bladder incontinence, continue to struggle with weight issues, she enjoying work Music therapist, living with her parents at home PHYSICAL EXAM:   Vitals:   01/27/24 1057  BP: 122/76  Pulse: 99  Weight: (!) 303 lb (137.4 kg)    Body mass index is 55.42 kg/m.  PHYSICAL EXAMNIATION:  Gen: NAD, conversant, well nourised, well groomed                     Cardiovascular: Regular rate rhythm, no peripheral edema, warm, nontender. Eyes: Conjunctivae clear without exudates or hemorrhage Neck: Supple, no carotid bruits. Pulmonary: Clear to auscultation bilaterally   NEUROLOGICAL EXAM:  MENTAL STATUS: Speech/cognition: Obese, awake, alert, oriented to history taking and casual conversation CRANIAL NERVES: CN II: Visual fields are full to confrontation. Pupils are round equal and briskly reactive to light. CN III, IV, VI: extraocular movement are normal. No ptosis. CN V: Facial sensation is intact to light touch CN VII: Face is symmetric with normal eye closure  CN VIII: Hearing is normal to causal conversation. CN IX, X: Phonation is normal. CN XI: Head turning and shoulder shrug are intact  MOTOR: There is no pronator drift of out-stretched arms. Muscle bulk and tone are normal. Muscle strength is normal.  REFLEXES: Reflexes are 1 and symmetric at the biceps, triceps, knees, and ankles. Plantar responses are flexor.  SENSORY: Intact to light touch, pinprick and vibratory sensation are intact in fingers and toes.  COORDINATION: There is  no trunk or limb dysmetria noted.  GAIT/STANCE: Push-up from seated position, steady  REVIEW OF SYSTEMS:  Full 14 system review of systems performed and notable only for as above All other review of systems were negative.   ALLERGIES: Allergies  Allergen Reactions   5-Alpha Reductase Inhibitors    Canagliflozin Other (See Comments)    Severe yeast infections   Melatonin Other (See Comments)   Metformin Other (See Comments)   Metformin And Related     GI distress   Onglyza [Saxagliptin]     headaches   Statins Other (See Comments)   Zofran [Ondansetron Hcl] Other (See Comments)    Itchy rash in mouth   Cefaclor Rash    Rash   Penicillins Rash    Has patient had a PCN reaction causing immediate rash, facial/tongue/throat swelling, SOB or lightheadedness with hypotension: No Has patient had a PCN reaction causing severe rash involving mucus membranes or skin necrosis: No Has patient had a PCN reaction that required hospitalization: No Has patient had a PCN reaction occurring within the last 10 years: Yes If all of the above answers are "NO", then may proceed with Cephalosporin use.    Pregabalin Itching and Rash   Sitagliptin Rash    HOME MEDICATIONS: Current Outpatient Medications  Medication Sig Dispense Refill   BD PEN NEEDLE NANO 2ND GEN 32G  X 4 MM MISC USE AS DIRECTED TO INJECT INSULIN TWICE DAILY 200 each 2   dicyclomine (BENTYL) 10 MG capsule Take 1 capsule (10 mg total) by mouth 3 (three) times daily as needed for spasms. (Patient taking differently: Take 10 mg by mouth every 8 (eight) hours as needed (stomach spasms.).) 30 capsule 0   gabapentin (NEURONTIN) 300 MG capsule Take 300 mg by mouth 3 (three) times daily.  3   glucose blood (ACCU-CHEK AVIVA PLUS) test strip Use as instructed to monitor glucose 3 times daily 100 each 6   glucose blood test strip See admin instructions.     hydrOXYzine (ATARAX/VISTARIL) 25 MG tablet Take 25 mg by mouth 2 (two) times daily as  needed for itching or anxiety.     insulin aspart protamine - aspart (NOVOLOG MIX 70/30 FLEXPEN) (70-30) 100 UNIT/ML FlexPen Inject 40 Units into the skin 2 (two) times daily with a meal. 30 units with breakfast and 30 units with supper (Patient taking differently: Inject 40 Units into the skin 2 (two) times daily with a meal. 20 units with breakfast and 20 units with supper) 75 mL 3   levothyroxine (SYNTHROID) 150 MCG tablet Take 1 tablet (150 mcg total) by mouth daily before breakfast. 90 tablet 1   nystatin cream (MYCOSTATIN) APPLY TO AFFECTED AREA TWICE A DAY 30 g 3   pantoprazole (PROTONIX) 40 MG tablet TAKE 1 TABLET BY MOUTH DAILY BEFORE BREAKFAST 90 tablet 0   traMADol (ULTRAM) 50 MG tablet TAKE 1 TABLET BY MOUTH EVERY 12 HOURS AS NEEDED (Patient taking differently: Take 50 mg by mouth 2 (two) times daily.) 60 tablet 3   Vitamin D, Ergocalciferol, (DRISDOL) 1.25 MG (50000 UNIT) CAPS capsule Take 1 capsule (50,000 Units total) by mouth every 7 (seven) days. 12 capsule 2   amitriptyline (ELAVIL) 25 MG tablet Take 25 mg by mouth at bedtime. (Patient not taking: Reported on 01/27/2024)     No current facility-administered medications for this visit.    PAST MEDICAL HISTORY: Past Medical History:  Diagnosis Date   Arthritis    back   Carpal tunnel syndrome    both wrists   Diabetes mellitus without complication (HCC)    Dr. Lurene Shadow   GERD (gastroesophageal reflux disease)    History of kidney stones    Hyperlipidemia    Hypothyroidism    Kidney stone    Neuropathy    feet and hands   Thyroid disease     PAST SURGICAL HISTORY: Past Surgical History:  Procedure Laterality Date   BACK SURGERY  1990s   CYSTOSCOPY/URETEROSCOPY/HOLMIUM LASER/STENT PLACEMENT Right 06/20/2020   Procedure: CYSTOSCOPY, RIGHT RETROGRADE /STENT PLACEMENT;  Surgeon: Bjorn Pippin, MD;  Location: [facility];  Service: Urology;  Laterality: Right;   CYSTOSCOPY/URETEROSCOPY/HOLMIUM LASER/STENT PLACEMENT Right 07/11/2020    Procedure: CYSTOSCOPY RIGHT URETEROSCOPY/HOLMIUM LASER/STENT EXCHANGE;  Surgeon: Bjorn Pippin, MD;  Location: [facility];  Service: Urology;  Laterality: Right;   FEMUR IM NAIL Left 10/03/2013   Procedure: INTRAMEDULLARY (IM) RETROGRADE FEMORAL NAILING;  Surgeon: Eldred Manges, MD;  Location: [facility];  Service: Orthopedics;  Laterality: Left;   tubes in ears      FAMILY HISTORY: Family History  Problem Relation Age of Onset   Thyroid disease Mother    Hyperlipidemia Mother    Kidney Stones Father    Thyroid disease Maternal Grandmother    Other Maternal Grandmother        heart issue   Heart attack Maternal Grandfather  Diabetes Maternal Grandfather    Colon cancer Maternal Grandfather    Cancer Maternal Grandfather        prostate   Heart attack Paternal Grandmother     SOCIAL HISTORY: Social History   Socioeconomic History   Marital status: Single    Spouse name: Not on file   Number of children: Not on file   Years of education: 12   Highest education level: Not on file  Occupational History   Not on file  Tobacco Use   Smoking status: Never   Smokeless tobacco: Never  Vaping Use   Vaping status: Never Used  Substance and Sexual Activity   Alcohol use: No   Drug use: No   Sexual activity: Never    Birth control/protection: None  Other Topics Concern   Not on file  Social History Narrative   Patient lives at home with her parents [place] and [place].   Patient has a high school certificate.   Right handed.   Caffeine coke cola sometimes.            Social Drivers of Corporate investment banker Strain: Not on file  Food Insecurity: Not on file  Transportation Needs: Not on file  Physical Activity: Not on file  Stress: Not on file  Social Connections: Not on file  Intimate Partner Violence: Not on file      Levert Feinstein, M.D. Ph.D.  [facility] [address] [place], [place] [address] Ph: [phone] Fax: [phone]  CC:   Marylynn Pearson, FNP [address] [place],  [place] [address]  Marylynn Pearson, FNP

## 2024-01-31 ENCOUNTER — Telehealth: Payer: Self-pay | Admitting: Neurology

## 2024-01-31 NOTE — Telephone Encounter (Signed)
 [employer] Berkley Harvey: [phone] exp. 01/31/24-03/30/24 for [employer].

## 2024-02-01 ENCOUNTER — Ambulatory Visit: Admitting: Neurology

## 2024-02-01 DIAGNOSIS — R6250 Unspecified lack of expected normal physiological development in childhood: Secondary | ICD-10-CM

## 2024-02-01 DIAGNOSIS — R569 Unspecified convulsions: Secondary | ICD-10-CM

## 2024-02-04 ENCOUNTER — Ambulatory Visit (HOSPITAL_COMMUNITY)
Admission: RE | Admit: 2024-02-04 | Discharge: 2024-02-04 | Disposition: A | Discharge status: Home / Self Care | Source: Ambulatory Visit | Attending: Neurology | Admitting: Neurology

## 2024-02-04 DIAGNOSIS — R6250 Unspecified lack of expected normal physiological development in childhood: Secondary | ICD-10-CM | POA: Insufficient documentation

## 2024-02-04 DIAGNOSIS — R569 Unspecified convulsions: Secondary | ICD-10-CM | POA: Insufficient documentation

## 2024-02-04 MED ORDER — GADOBUTROL 1 MMOL/ML IV SOLN
10.0000 mL | Freq: Once | INTRAVENOUS | Status: AC | PRN
  Administered 2024-02-04: 10 mL via INTRAVENOUS

## 2024-02-10 ENCOUNTER — Telehealth: Payer: Self-pay

## 2024-02-10 NOTE — Telephone Encounter (Signed)
 Call to mother, reviewed MRI results. Verbalized understanding.

## 2024-02-16 ENCOUNTER — Telehealth: Payer: Self-pay | Admitting: Neurology

## 2024-02-16 NOTE — Telephone Encounter (Signed)
 Pt's mother asking to be called with results to EEG

## 2024-02-28 NOTE — Telephone Encounter (Signed)
 Patient's mother, Benn Brash; Checking on the status of EEG results

## 2024-02-29 ENCOUNTER — Telehealth: Payer: Self-pay | Admitting: *Deleted

## 2024-02-29 NOTE — Telephone Encounter (Signed)
 Patient's mother left a message that the patient is having low blood sugar again, she is asking for a call back. I called the patient's mother, a voicemail was left asking that she call our office back.

## 2024-03-01 NOTE — Telephone Encounter (Signed)
 Those glucose readings actually look really good.  Is there a certain point (glucose range) where she tends to have symptoms?  She can certainly reduce her insulin  to 15 units twice daily and see if she still has those reactions.

## 2024-03-01 NOTE — Telephone Encounter (Signed)
Patients mother was called and made aware.

## 2024-03-01 NOTE — Telephone Encounter (Signed)
 Pt called with high BG readings.   Date Before breakfast Before lunch Before supper Bedtime  02/26/24 113  132   02/27/24 141  106   02/28/24 127  132   02/29/24 125  170     Pt taking: Novolog  70/30 patient injects 20 units at breakfast , 20 units at supper. Patient's mother shares that the patient will looked flushed , get cold, have chills and shaking all over when her blood sugars are low. Her mother ask if the patient may be having a reaction to the Novolog  70/30? Patient has seen the Neurologist, had MRI this was normal and they are waiting for the results of the EEG. Alexandra Kelley also shares that she has seen a low in the 90's as well, she states that there have been times that she has held the patient's insulin  concerned that she may pass out.

## 2024-03-06 NOTE — Procedures (Signed)
   HISTORY: 43 years old female, developmentally delayed, presenting with seizure-like activity  TECHNIQUE:  This is a routine 16 channel EEG recording with one channel devoted to a limited EKG recording.  It was performed during wakefulness, drowsiness and asleep.  Hyperventilation and photic stimulation were performed as activating procedures.  There are minimum muscle and movement artifact noted.  Upon maximum arousal, posterior dominant waking rhythm consistent of dysrhythmic lower amplitude alpha range activity. Activities are symmetric over the bilateral posterior derivations and attenuated with eye opening.  Photic stimulation did not alter the tracing.  Hyperventilation produced mild/moderate buildup with higher amplitude and the slower activities noted.  During EEG recording, patient developed drowsiness and no deeper stage of sleep was achieved  During EEG recording, there was no epileptiform discharge noted.  EKG demonstrate normal sinus rhythm.  CONCLUSION: This is a  normal awake EEG.  There is no electrodiagnostic evidence of epileptiform discharge.  Lateshia Schmoker, M.D. Ph.D.  [facility] [address] [place], [place] [address] Phone: [phone] Fax:      [phone]

## 2024-03-07 ENCOUNTER — Encounter: Payer: Self-pay | Admitting: Neurology

## 2024-03-07 NOTE — Telephone Encounter (Signed)
 Patient's mother, Latya Hendricksen said have received results through MyChart. Would like a call back to go over results.

## 2024-03-07 NOTE — Telephone Encounter (Signed)
 Call to mom, reviewed EEG results. Also updated insulin  in medication list per mom report. Now getting 15 units novolog  twice daily.

## 2024-03-07 NOTE — Addendum Note (Signed)
 Addended by: Genora Kidd on: 03/07/2024 10:28 AM   Modules accepted: Orders

## 2024-03-09 ENCOUNTER — Other Ambulatory Visit: Payer: Self-pay | Admitting: Adult Health

## 2024-03-23 ENCOUNTER — Encounter: Payer: Self-pay | Admitting: Nurse Practitioner

## 2024-03-29 MED ORDER — GLIPIZIDE ER 2.5 MG PO TB24: 2.5000 mg | ORAL_TABLET | Freq: Every day | ORAL | 1 refills | Status: DC

## 2024-04-13 ENCOUNTER — Other Ambulatory Visit: Payer: Self-pay | Admitting: Urology

## 2024-04-13 ENCOUNTER — Ambulatory Visit (HOSPITAL_COMMUNITY)
Admission: RE | Admit: 2024-04-13 | Discharge: 2024-04-13 | Disposition: A | Discharge status: Home / Self Care | Source: Ambulatory Visit | Attending: Urology | Admitting: Urology

## 2024-04-13 ENCOUNTER — Ambulatory Visit: Payer: Medicaid Other | Admitting: Nurse Practitioner

## 2024-04-13 ENCOUNTER — Encounter: Payer: Self-pay | Admitting: Urology

## 2024-04-13 ENCOUNTER — Ambulatory Visit: Admitting: Urology

## 2024-04-13 ENCOUNTER — Ambulatory Visit: Payer: Self-pay

## 2024-04-13 VITALS — BP 96/65 | HR 98

## 2024-04-13 DIAGNOSIS — N200 Calculus of kidney: Secondary | ICD-10-CM

## 2024-04-13 DIAGNOSIS — Z87440 Personal history of urinary (tract) infections: Secondary | ICD-10-CM | POA: Diagnosis not present

## 2024-04-13 NOTE — Progress Notes (Signed)
 d Subjective:  1. Bilateral renal stones   2. Personal history of urinary infection     04/13/24: Alexandra Kelley returns today in f/u.  She has had no pain or hematuria. The CT was denied so I ordered a KUB and renal US  but they haven't been done yet.  She has had no UTI symptoms.   9/45/24: Alexandra Kelley returns today in f/u. She has had no hematuria, pain or UTI's.   She has bilateral renal stones without obstruction on KUB and RUS prior to this visit with the largest on the right 1.6cm and 1.3cm on the left.  Her UA has 6-10 WBC's.   01/07/23: Alexandra Kelley returns today in f/u.  She had a CT in February and had stable non-obstructing renal stones, right > left.  She had some increased diameter of the small bowel and there was a question of a partial SBO but she has seen Dr. Collene Dawson and she didn't think that was what was going on.   She is voiding well and she has no flank pain or hematuria.    06/25/22: Alexandra Kelley returns today in f/u for her history of stones as noted below.  She is doing well without flank pain or hematuria.   There are stable stones on KUB with a 9mm stone that has the appearance of being in there proximal ureter but on the prior CT the lower pole is very medial and the stone in the lower pole on the CT overlies the spine in a similar location as today's KUB.  The renal US  shows no hydro.    12/23/21: Alexandra Kelley returns today in f/u for history of stones.  She had a right ureteroscopy for multiple right renal stones on 07/11/20.  She had her stent removed on 07/16/20.   Her stones were calcium oxalate.  She had a post op CT in 10/21 and had multiple residual non-obstructing right renal and left renal stones but the stone burden was reduced.   At CT prior to this visit shows possible a small increase in stone burden or probably just a repositioning of the stone burden as she has more stones in the right renal pelvis and less in the RUP that on her prior scan.  She has done well without pain, fever or hematuria.  Her UA has 1+ LE.  Her stones are primarily calcium oxalate.  She is trying to hydrate and avoid salt.     ROS:  ROS:  A complete review of systems was performed.  All systems are negative except for pertinent findings as noted.   ROS  Allergies  Allergen Reactions   5-Alpha Reductase Inhibitors    Canagliflozin Other (See Comments)    Severe yeast infections   Melatonin Other (See Comments)   Metformin  Other (See Comments)   Metformin  And Related     GI distress   Onglyza [Saxagliptin]     headaches   Statins Other (See Comments)   Zofran  [Ondansetron  Hcl] Other (See Comments)    Itchy rash in mouth   Cefaclor Rash    Rash   Penicillins Rash    Has patient had a PCN reaction causing immediate rash, facial/tongue/throat swelling, SOB or lightheadedness with hypotension: No Has patient had a PCN reaction causing severe rash involving mucus membranes or skin necrosis: No Has patient had a PCN reaction that required hospitalization: No Has patient had a PCN reaction occurring within the last 10 years: Yes If all of the above answers are NO, then may proceed  with Cephalosporin use.    Pregabalin  Itching and Rash   Sitagliptin Rash    Outpatient Encounter Medications as of 04/13/2024  Medication Sig   dicyclomine  (BENTYL ) 10 MG capsule Take 1 capsule (10 mg total) by mouth 3 (three) times daily as needed for spasms.   gabapentin  (NEURONTIN ) 300 MG capsule Take 300 mg by mouth 3 (three) times daily.   glipiZIDE  (GLUCOTROL  XL) 2.5 MG 24 hr tablet Take 1 tablet (2.5 mg total) by mouth daily with breakfast.   glucose blood (ACCU-CHEK AVIVA PLUS) test strip Use as instructed to monitor glucose 3 times daily   glucose blood test strip See admin instructions.   hydrOXYzine (ATARAX/VISTARIL) 25 MG tablet Take 25 mg by mouth 2 (two) times daily as needed for itching or anxiety.   levothyroxine  (SYNTHROID ) 150 MCG tablet Take 1 tablet (150 mcg total) by mouth daily before breakfast.    nystatin  cream (MYCOSTATIN ) APPLY TO AFFECTED AREA TWICE A DAY   pantoprazole  (PROTONIX ) 40 MG tablet TAKE 1 TABLET BY MOUTH DAILY BEFORE BREAKFAST   traMADol  (ULTRAM ) 50 MG tablet TAKE 1 TABLET BY MOUTH EVERY 12 HOURS AS NEEDED   ALPRAZolam  (XANAX ) 0.5 MG tablet Take 1-2 tablets 30 minutes prior to MRI, may repeat once as needed. Must have driver. (Patient not taking: Reported on 04/13/2024)   amitriptyline  (ELAVIL ) 25 MG tablet Take 25 mg by mouth at bedtime. (Patient not taking: Reported on 04/13/2024)   BD PEN NEEDLE NANO 2ND GEN 32G X 4 MM MISC USE AS DIRECTED TO INJECT INSULIN  TWICE DAILY (Patient not taking: Reported on 04/13/2024)   insulin  aspart protamine - aspart (NOVOLOG  70/30 MIX) (70-30) 100 UNIT/ML FlexPen Inject 15 Units into the skin 2 (two) times daily. (Patient not taking: Reported on 04/13/2024)   Vitamin D , Ergocalciferol , (DRISDOL ) 1.25 MG (50000 UNIT) CAPS capsule Take 1 capsule (50,000 Units total) by mouth every 7 (seven) days. (Patient not taking: Reported on 04/13/2024)   No facility-administered encounter medications on file as of 04/13/2024.    Past Medical History:  Diagnosis Date   Arthritis    back   Carpal tunnel syndrome    both wrists   Diabetes mellitus without complication (HCC)    Dr. Kristan Petit   GERD (gastroesophageal reflux disease)    History of kidney stones    Hyperlipidemia    Hypothyroidism    Kidney stone    Neuropathy    feet and hands   Thyroid  disease     Past Surgical History:  Procedure Laterality Date   BACK SURGERY  1990s   CYSTOSCOPY/URETEROSCOPY/HOLMIUM LASER/STENT PLACEMENT Right 06/20/2020   Procedure: CYSTOSCOPY, RIGHT RETROGRADE /STENT PLACEMENT;  Surgeon: Homero Luster, MD;  Location: [facility];  Service: Urology;  Laterality: Right;   CYSTOSCOPY/URETEROSCOPY/HOLMIUM LASER/STENT PLACEMENT Right 07/11/2020   Procedure: CYSTOSCOPY RIGHT URETEROSCOPY/HOLMIUM LASER/STENT EXCHANGE;  Surgeon: Homero Luster, MD;  Location: [facility];  Service:  Urology;  Laterality: Right;   FEMUR IM NAIL Left 10/03/2013   Procedure: INTRAMEDULLARY (IM) RETROGRADE FEMORAL NAILING;  Surgeon: Adah Acron, MD;  Location: [facility];  Service: Orthopedics;  Laterality: Left;   tubes in ears      Social History   Socioeconomic History   Marital status: Single    Spouse name: Not on file   Number of children: Not on file   Years of education: 12   Highest education level: Not on file  Occupational History   Not on file  Tobacco Use   Smoking status: Never  Smokeless tobacco: Never  Vaping Use   Vaping status: Never Used  Substance and Sexual Activity   Alcohol use: No   Drug use: No   Sexual activity: Never    Birth control/protection: None  Other Topics Concern   Not on file  Social History Narrative   Patient lives at home with her parents [place] and 2.   Patient has a high school certificate.   Right handed.   Caffeine coke cola sometimes.            Social Drivers of Corporate investment banker Strain: Not on file  Food Insecurity: Not on file  Transportation Needs: Not on file  Physical Activity: Not on file  Stress: Not on file  Social Connections: Not on file  Intimate Partner Violence: Not on file    Family History  Problem Relation Age of Onset   Thyroid  disease Mother    Hyperlipidemia Mother    Kidney Stones Father    Thyroid  disease Maternal Grandmother    Other Maternal Grandmother        heart issue   Heart attack Maternal Grandfather    Diabetes Maternal Grandfather    Colon cancer Maternal Grandfather    Cancer Maternal Grandfather        prostate   Heart attack Paternal Grandmother        Objective: Vitals:   04/13/24 1328  BP: 96/65  Pulse: 98     Physical Exam  Lab Results:  No results found for this or any previous visit (from the past 24 hours).      BMET No results for input(s): NA, K, CL, CO2, GLUCOSE, BUN, CREATININE, CALCIUM in the last 72 hours. PSA No  results found for: PSA No results found for: TESTOSTERONE    Studies/Results: US  RENAL Result Date: 04/13/2024 CLINICAL DATA:  renal stones EXAM: RENAL / URINARY TRACT ULTRASOUND COMPLETE COMPARISON:  July 02, 2023 FINDINGS: Right Kidney: Renal measurements: 13 x 5.5 x 5.4 cm = volume: 202 mL. Normal echogenicity. No mass. Nonobstructive calculi measuring up to 1.6 cm. No hydronephrosis. Left Kidney: Renal measurements: 11.9 x 5.7 x 6.2 cm = volume: 220 mL. Normal echogenicity. No mass. No hydronephrosis or visualized nephrolithiasis. Bladder: Not well visualized or evaluated due to complete decompression. Other: None. IMPRESSION: Nonobstructive right nephrolithiasis. No visualized nephrolithiasis in the left kidney. No hydronephrosis. Electronically Signed   By: Rance Burrows M.D.   On: 04/13/2024 15:12   DG Abd 1 View Result Date: 04/13/2024 CLINICAL DATA:  Renal stones EXAM: ABDOMEN - 1 VIEW COMPARISON:  None Available. FINDINGS: 4 mm stone in the left kidney lower pole calices and 4 mm calcification in the right kidney lower pole calices correlate with lithiasis. IMPRESSION: Bilateral nephrolithiasis. Electronically Signed   By: Fredrich Jefferson M.D.   On: 04/13/2024 14:43   MR BRAIN W WO CONTRAST Result Date: 02/08/2024 CLINICAL DATA:  Provided history: Seizure-like activity. Developmental delay. Seizure disorder, clinical change. EXAM: MRI HEAD WITHOUT AND WITH CONTRAST TECHNIQUE: Multiplanar, multiecho pulse sequences of the brain and surrounding structures were obtained without and with intravenous contrast. CONTRAST:  10mL GADAVIST  GADOBUTROL  1 MMOL/ML IV SOLN COMPARISON:  None. FINDINGS: Brain: Cerebral volume is normal. No cortical encephalomalacia is identified. No significant cerebral white matter disease. No appreciable hippocampal size or signal asymmetry. There is no acute infarct. No evidence of an intracranial mass. No chronic intracranial blood products. No extra-axial fluid  collection. No midline shift. No pathologic intracranial enhancement identified.  Vascular: Maintained flow voids within the proximal large arterial vessels. Skull and upper cervical spine: No focal worrisome marrow lesion. Sinuses/Orbits: No mass or acute finding within the imaged orbits. No significant paranasal sinus disease. IMPRESSION: Unremarkable MRI appearance of the brain. No evidence of an acute intracranial abnormality. No seizure etiology identified. Electronically Signed   By: Bascom Lily D.O.   On: 02/08/2024 19:39   EEG adult Result Date: 02/01/2024 Phebe Brasil, MD     03/06/2024  5:33 PM HISTORY: 43 years old female, developmentally delayed, presenting with seizure-like activity TECHNIQUE: This is a routine 16 channel EEG recording with one channel devoted to a limited EKG recording.  It was performed during wakefulness, drowsiness and asleep.  Hyperventilation and photic stimulation were performed as activating procedures.  There are minimum muscle and movement artifact noted. Upon maximum arousal, posterior dominant waking rhythm consistent of dysrhythmic lower amplitude alpha range activity. Activities are symmetric over the bilateral posterior derivations and attenuated with eye opening. Photic stimulation did not alter the tracing. Hyperventilation produced mild/moderate buildup with higher amplitude and the slower activities noted. During EEG recording, patient developed drowsiness and no deeper stage of sleep was achieved During EEG recording, there was no epileptiform discharge noted. EKG demonstrate normal sinus rhythm. CONCLUSION: This is a  normal awake EEG.  There is no electrodiagnostic evidence of epileptiform discharge. Yijun Yan, M.D. Ph.D. [facility] [address] [place], [place] [address] Phone: [phone] Fax:      [phone]       UA reviewed.  6-10 WBC, >30 RBC's and few bacteria.    Assessment & Plan: Bilateral  renal stones with prior right URS.  KUB  and RUS ordered.  She does have >30 RBC's today so I will consider a CT if there is an increased stone burden or obstruction on RUS.    She was encouraged to continue the crystal light.  Otherwise she will return in 18mo with a KUB and renal US .   History of UTI.  UA has a few WBC and bacteria and >30 RBC's but no symptoms.   No need for a culture.   No orders of the defined types were placed in this encounter.     Orders Placed This Encounter  Procedures   US  RENAL    Standing Status:   Future    Expected Date:   10/13/2024    Expiration Date:   04/13/2025    Reason for Exam (SYMPTOM  OR DIAGNOSIS REQUIRED):   renal stone    Preferred imaging location?:   [facility]   DG Abd 1 View    Standing Status:   Future    Expected Date:   10/13/2024    Reason for Exam (SYMPTOM  OR DIAGNOSIS REQUIRED):   renal stones    Preferred imaging location?:   [facility]    Radiology Contrast Protocol - do NOT remove file path:   [email or web address]    Is patient pregnant?:   No      Return in about 6 months (around 10/13/2024) for KUB and US . with Isa Manuel.   CC: Jace Martinet, FNP  And Dr. Angelica Kemp.    Homero Luster 04/14/2024 Patient ID: Massie Soles, female   DOB: Sep 28, 1981, 43 y.o.   MRN: [id number]

## 2024-04-27 ENCOUNTER — Encounter: Payer: Self-pay | Admitting: Nurse Practitioner

## 2024-04-27 ENCOUNTER — Ambulatory Visit: Admitting: Nurse Practitioner

## 2024-04-27 VITALS — BP 112/72 | HR 102 | Ht 62.0 in | Wt 299.4 lb

## 2024-04-27 DIAGNOSIS — E039 Hypothyroidism, unspecified: Secondary | ICD-10-CM

## 2024-04-27 DIAGNOSIS — E782 Mixed hyperlipidemia: Secondary | ICD-10-CM | POA: Diagnosis not present

## 2024-04-27 DIAGNOSIS — E559 Vitamin D deficiency, unspecified: Secondary | ICD-10-CM

## 2024-04-27 DIAGNOSIS — E119 Type 2 diabetes mellitus without complications: Secondary | ICD-10-CM

## 2024-04-27 DIAGNOSIS — I10 Essential (primary) hypertension: Secondary | ICD-10-CM

## 2024-04-27 DIAGNOSIS — Z794 Long term (current) use of insulin: Secondary | ICD-10-CM

## 2024-04-27 LAB — POCT GLYCOSYLATED HEMOGLOBIN (HGB A1C): Hemoglobin A1C: 7.3 % — AB (ref 4.0–5.6)

## 2024-04-27 MED ORDER — GLIPIZIDE ER 2.5 MG PO TB24: 2.5000 mg | ORAL_TABLET | Freq: Every day | ORAL | 1 refills | Status: DC

## 2024-04-27 MED ORDER — LEVOTHYROXINE SODIUM 150 MCG PO TABS: 150.0000 ug | ORAL_TABLET | Freq: Every day | ORAL | 1 refills | Status: DC

## 2024-04-27 NOTE — Progress Notes (Signed)
 Endocrinology Follow Up Note       04/27/2024, 3:28 PM   Subjective:    Patient ID: Alexandra Kelley, female    DOB: 03/16/1981.  Alexandra Kelley is being seen in follow up after being seen in consultation for management of currently uncontrolled symptomatic diabetes and hypothyroidism requested by  Alexandra Lurie, FNP.   Past Medical History:  Diagnosis Date   Arthritis    back   Carpal tunnel syndrome    both wrists   Diabetes mellitus without complication (HCC)    Dr. Adel   GERD (gastroesophageal reflux disease)    History of kidney stones    Hyperlipidemia    Hypothyroidism    Kidney stone    Neuropathy    feet and hands   Thyroid  disease     Past Surgical History:  Procedure Laterality Date   BACK SURGERY  1990s   CYSTOSCOPY/URETEROSCOPY/HOLMIUM LASER/STENT PLACEMENT Right 06/20/2020   Procedure: CYSTOSCOPY, RIGHT RETROGRADE /STENT PLACEMENT;  Surgeon: Watt Rush, MD;  Location: [facility];  Service: Urology;  Laterality: Right;   CYSTOSCOPY/URETEROSCOPY/HOLMIUM LASER/STENT PLACEMENT Right 07/11/2020   Procedure: CYSTOSCOPY RIGHT URETEROSCOPY/HOLMIUM LASER/STENT EXCHANGE;  Surgeon: Watt Rush, MD;  Location: [facility];  Service: Urology;  Laterality: Right;   FEMUR IM NAIL Left 10/03/2013   Procedure: INTRAMEDULLARY (IM) RETROGRADE FEMORAL NAILING;  Surgeon: Oneil JAYSON Herald, MD;  Location: [facility];  Service: Orthopedics;  Laterality: Left;   tubes in ears      Social History   Socioeconomic History   Marital status: Single    Spouse name: Not on file   Number of children: Not on file   Years of education: 12   Highest education level: Not on file  Occupational History   Not on file  Tobacco Use   Smoking status: Never   Smokeless tobacco: Never  Vaping Use   Vaping status: Never Used  Substance and Sexual Activity   Alcohol use: No   Drug use: No   Sexual activity: Never    Birth  control/protection: None  Other Topics Concern   Not on file  Social History Narrative   Patient lives at home with her parents [place] and 40.   Patient has a high school certificate.   Right handed.   Caffeine coke cola sometimes.            Social Drivers of Corporate investment banker Strain: Not on file  Food Insecurity: Not on file  Transportation Needs: Not on file  Physical Activity: Not on file  Stress: Not on file  Social Connections: Not on file    Family History  Problem Relation Age of Onset   Thyroid  disease Mother    Hyperlipidemia Mother    Kidney Stones Father    Thyroid  disease Maternal Grandmother    Other Maternal Grandmother        heart issue   Heart attack Maternal Grandfather    Diabetes Maternal Grandfather    Colon cancer Maternal Grandfather    Cancer Maternal Grandfather        prostate   Heart attack Paternal Grandmother     Outpatient Encounter Medications as of 04/27/2024  Medication Sig   dicyclomine  (BENTYL ) 10 MG capsule Take 1 capsule (10 mg total) by mouth 3 (three) times daily as needed for spasms.   gabapentin  (NEURONTIN ) 300 MG capsule Take 300 mg by mouth 3 (three) times daily.   glucose blood (ACCU-CHEK AVIVA PLUS) test strip Use as instructed to monitor glucose 3 times daily   glucose blood test strip See admin instructions.   hydrOXYzine (ATARAX/VISTARIL) 25 MG tablet Take 25 mg by mouth 2 (two) times daily as needed for itching or anxiety.   nystatin  cream (MYCOSTATIN ) APPLY TO AFFECTED AREA TWICE A DAY   pantoprazole  (PROTONIX ) 40 MG tablet TAKE 1 TABLET BY MOUTH DAILY BEFORE BREAKFAST   traMADol  (ULTRAM ) 50 MG tablet TAKE 1 TABLET BY MOUTH EVERY 12 HOURS AS NEEDED   [DISCONTINUED] glipiZIDE  (GLUCOTROL  XL) 2.5 MG 24 hr tablet Take 1 tablet (2.5 mg total) by mouth daily with breakfast.   [DISCONTINUED] levothyroxine  (SYNTHROID ) 150 MCG tablet Take 1 tablet (150 mcg total) by mouth daily before breakfast.   glipiZIDE   (GLUCOTROL  XL) 2.5 MG 24 hr tablet Take 1 tablet (2.5 mg total) by mouth daily with breakfast.   levothyroxine  (SYNTHROID ) 150 MCG tablet Take 1 tablet (150 mcg total) by mouth daily before breakfast.   [DISCONTINUED] ALPRAZolam  (XANAX ) 0.5 MG tablet Take 1-2 tablets 30 minutes prior to MRI, may repeat once as needed. Must have driver. (Patient not taking: Reported on 04/27/2024)   [DISCONTINUED] amitriptyline  (ELAVIL ) 25 MG tablet Take 25 mg by mouth at bedtime. (Patient not taking: Reported on 04/27/2024)   [DISCONTINUED] BD PEN NEEDLE NANO 2ND GEN 32G X 4 MM MISC USE AS DIRECTED TO INJECT INSULIN  TWICE DAILY (Patient not taking: Reported on 04/27/2024)   [DISCONTINUED] insulin  aspart protamine - aspart (NOVOLOG  70/30 MIX) (70-30) 100 UNIT/ML FlexPen Inject 15 Units into the skin 2 (two) times daily. (Patient not taking: Reported on 04/27/2024)   [DISCONTINUED] Vitamin D , Ergocalciferol , (DRISDOL ) 1.25 MG (50000 UNIT) CAPS capsule Take 1 capsule (50,000 Units total) by mouth every 7 (seven) days. (Patient not taking: Reported on 04/27/2024)   No facility-administered encounter medications on file as of 04/27/2024.    ALLERGIES: Allergies  Allergen Reactions   5-Alpha Reductase Inhibitors    Canagliflozin Other (See Comments)    Severe yeast infections   Melatonin Other (See Comments)   Metformin  Other (See Comments)   Metformin  And Related     GI distress   Onglyza [Saxagliptin]     headaches   Statins Other (See Comments)   Zofran  [Ondansetron  Hcl] Other (See Comments)    Itchy rash in mouth   Cefaclor Rash    Rash   Penicillins Rash    Has patient had a PCN reaction causing immediate rash, facial/tongue/throat swelling, SOB or lightheadedness with hypotension: No Has patient had a PCN reaction causing severe rash involving mucus membranes or skin necrosis: No Has patient had a PCN reaction that required hospitalization: No Has patient had a PCN reaction occurring within the last 10  years: Yes If all of the above answers are NO, then may proceed with Cephalosporin use.    Pregabalin  Itching and Rash   Sitagliptin Rash    VACCINATION STATUS: There is no immunization history for the selected administration types on file for this patient.  Diabetes She presents for her follow-up diabetic visit. She has type 2 diabetes mellitus. Onset time: diagnosed at approx age of 70. Her disease course has been improving. There are no hypoglycemic associated symptoms. Pertinent negatives  for hypoglycemia include no nervousness/anxiousness or tremors. There are no diabetic associated symptoms. Pertinent negatives for diabetes include no fatigue. There are no hypoglycemic complications. Symptoms are stable. Diabetic complications include peripheral neuropathy. Risk factors for coronary artery disease include diabetes mellitus and obesity. Current diabetic treatment includes oral agent (monotherapy). She is compliant with treatment most of the time. Her weight is fluctuating minimally. She is following a generally healthy diet. When asked about meal planning, she reported none. She has not had a previous visit with a dietitian. She never participates in exercise. Her home blood glucose trend is decreasing steadily. Her overall blood glucose range is 130-140 mg/dl. (She presents today, accompanied by her mother, with her meter, no logs, showing improved glycemic profile overall.  Her POCT A1c today is 7.3%, improving from last visit of 7.9%.  She did call between visits, was having a reaction to her insulin , thus it was stopped and we started her on Glipizide  in the interim.  She denies any hypoglycemia.) An ACE inhibitor/angiotensin II receptor blocker is not being taken. She sees a podiatrist.Eye exam is current.  Hyperlipidemia This is a chronic problem. The current episode started more than 1 year ago. Exacerbating diseases include diabetes, hypothyroidism and obesity. Factors aggravating her  hyperlipidemia include fatty foods. Current antihyperlipidemic treatment includes statins. Compliance problems include adherence to diet and adherence to exercise.  Risk factors for coronary artery disease include diabetes mellitus, dyslipidemia, family history, obesity and a sedentary lifestyle.  Thyroid  Problem Presents for initial visit. Symptoms include weight gain. Patient reports no anxiety, cold intolerance, constipation, depressed mood, fatigue, hair loss, heat intolerance, leg swelling, palpitations or tremors. Past treatments include levothyroxine . The treatment provided moderate relief. Her past medical history is significant for diabetes, hyperlipidemia and obesity. Risk factors include family history of hypothyroidism.    Review of systems  Constitutional: + decreasing body weight,  current Body mass index is 54.76 kg/m. , no fatigue, no subjective hyperthermia Eyes: no blurry vision, no xerophthalmia ENT: no sore throat, no nodules palpated in throat, no dysphagia/odynophagia, no hoarseness Cardiovascular: no chest pain, no shortness of breath, no palpitations, no leg swelling Respiratory: no cough, no shortness of breath Gastrointestinal: no nausea/vomiting/diarrhea Musculoskeletal: no muscle/joint aches Skin: no rashes, no hyperemia Neurological: no tremors, no numbness, no tingling, no dizziness Psychiatric: no depression, no anxiety  Objective:     BP 112/72 (BP Location: Left Arm, Patient Position: Sitting, Cuff Size: Large)   Pulse (!) 102   Ht 5' 2 (1.575 m)   Wt 299 lb 6.4 oz (135.8 kg)   BMI 54.76 kg/m   Wt Readings from Last 3 Encounters:  04/27/24 299 lb 6.4 oz (135.8 kg)  01/27/24 (!) 303 lb (137.4 kg)  12/09/23 (!) 301 lb (136.5 kg)     BP Readings from Last 3 Encounters:  04/27/24 112/72  04/13/24 96/65  01/27/24 122/76      Physical Exam- Limited  Constitutional:  Body mass index is 54.76 kg/m. , not in acute distress, normal state of  mind Eyes:  EOMI, no exophthalmos Musculoskeletal: no gross deformities, strength intact in all four extremities, no gross restriction of joint movements Skin:  no rashes, no hyperemia Neurological: no tremor with outstretched hands    Diabetic Foot Exam - Simple   Simple Foot Form Diabetic Foot exam was performed with the following findings: Yes 04/27/2024  3:23 PM  Visual Inspection No deformities, no ulcerations, no other skin breakdown bilaterally: Yes Sensation Testing Intact to touch and  monofilament testing bilaterally: Yes Pulse Check Posterior Tibialis and Dorsalis pulse intact bilaterally: Yes Comments Dry flaky skin to bilateral feet     CMP ( most recent) CMP     Component Value Date/Time   NA 140 10/07/2023 1602   K 4.5 10/07/2023 1602   CL 100 10/07/2023 1602   CO2 26 10/07/2023 1602   GLUCOSE 211 (H) 10/07/2023 1602   GLUCOSE 170 (H) 07/03/2020 1438   BUN 10 10/07/2023 1602   CREATININE 0.81 10/07/2023 1602   CALCIUM 9.5 10/07/2023 1602   PROT 7.1 10/07/2023 1602   ALBUMIN 4.0 10/07/2023 1602   AST 23 10/07/2023 1602   ALT 20 10/07/2023 1602   ALKPHOS 89 10/07/2023 1602   BILITOT 0.4 10/07/2023 1602   GFRNONAA >60 07/03/2020 1438   GFRAA >60 07/03/2020 1438     Diabetic Labs (most recent): Lab Results  Component Value Date   HGBA1C 7.3 (A) 04/27/2024   HGBA1C 7.7 (A) 08/05/2023   HGBA1C 7.4 (A) 04/01/2023     Lipid Panel ( most recent) Lipid Panel  No results found for: CHOL, TRIG, HDL, CHOLHDL, VLDL, LDLCALC, LDLDIRECT, LABVLDL    Lab Results  Component Value Date   TSH 3.110 10/07/2023   TSH 1.280 03/25/2023   TSH 0.760 01/21/2023   TSH 0.679 11/16/2022   TSH 0.578 12/25/2021   TSH 0.033 (L) 09/04/2021   TSH 1.812 10/17/2018   TSH 5.154 (H) 10/03/2013   FREET4 1.27 10/07/2023   FREET4 1.24 03/25/2023   FREET4 1.26 11/16/2022   FREET4 1.32 12/25/2021   FREET4 1.68 09/04/2021           Assessment & Plan:    1) Uncontrolled type 2 diabetes with long term insulin  therapy  She presents today, accompanied by her mother, with her meter, no logs, showing improved glycemic profile overall.  Her POCT A1c today is 7.3%, improving from last visit of 7.9%.  She did call between visits, was having a reaction to her insulin , thus it was stopped and we started her on Glipizide  in the interim.  She denies any hypoglycemia.  - Sonnie A Sontag has currently uncontrolled symptomatic type 2 DM since 43 years of age.   -Recent labs reviewed.  - I had a long discussion with her about the progressive nature of diabetes and the pathology behind its complications. -her diabetes is complicated by peripheral neuropathy and she remains at a high risk for more acute and chronic complications which include CAD, CVA, CKD, retinopathy, and neuropathy. These are all discussed in detail with her.  - Nutritional counseling repeated at each appointment due to patients tendency to fall back in to old habits.  - The patient admits there is a room for improvement in their diet and drink choices. -  Suggestion is made for the patient to avoid simple carbohydrates from their diet including Cakes, Sweet Desserts / Pastries, Ice Cream, Soda (diet and regular), Sweet Tea, Candies, Chips, Cookies, Sweet Pastries, Store Bought Juices, Alcohol in Excess of 1-2 drinks a day, Artificial Sweeteners, Coffee Creamer, and Sugar-free Products. This will help patient to have stable blood glucose profile and potentially avoid unintended weight gain.   - I encouraged the patient to switch to unprocessed or minimally processed complex starch and increased protein intake (animal or plant source), fruits, and vegetables.   - Patient is advised to stick to a routine mealtimes to eat 3 meals a day and avoid unnecessary snacks (to snack only to correct hypoglycemia).  -  she will be scheduled with Penny Crumpton, RDN, CDE for diabetes education.  - I  have approached her with the following individualized plan to manage her diabetes and patient agrees:   -Based on her stable glycemic profile, no changes will be made to her medications today.  She is advised to continue Glipizide  2.5 mg XL daily with breakfast.  -she is encouraged to continue monitoring blood glucose twice daily, before breakfast and before bed, and to call the clinic if she has readings less than 70 or above 300 for 3 tests in a row.  - she is warned not to take insulin  without proper monitoring per orders. - Adjustment parameters are given to her for hypo and hyperglycemia in writing.  - she will be considered for incretin therapy as appropriate next visit.  We have discussed this in the past but patient/mom are concerned about side effects.  - Specific targets for  A1c; LDL, HDL, and Triglycerides were discussed with the patient.  2) Blood Pressure /Hypertension:  her blood pressure is controlled to target without the use of antihypertensives.    3) Lipids/Hyperlipidemia:    Her recent lipid panel from 11/11/23 shows uncontrolled LDL of 127 and elevated triglycerides of 211.   she is advised to continue Crestor 40 mg daily at bedtime.  Side effects and precautions discussed with her.    4)  Weight/Diet:  her Body mass index is 54.76 kg/m.  -  clearly complicating her diabetes care.   she is a candidate for weight loss. I discussed with her the fact that loss of 5 - 10% of her  current body weight will have the most impact on her diabetes management.  Exercise, and detailed carbohydrates information provided  -  detailed on discharge instructions.  5) Hypothyroidism-unspecified There are no recent TFTs to review.   She is advised to continue Levothyroxine  150 mcg po daily before breakfast.  Will recheck prior to next visit and adjust dose accordingly.   - The correct intake of thyroid  hormone (Levothyroxine , Synthroid ), is on empty stomach first thing in the morning, with  water , separated by at least 30 minutes from breakfast and other medications,  and separated by more than 4 hours from calcium, iron, multivitamins, acid reflux medications (PPIs).  - This medication is a life-long medication and will be needed to correct thyroid  hormone imbalances for the rest of your life.  The dose may change from time to time, based on thyroid  blood work.  - It is extremely important to be consistent taking this medication, near the same time each morning.  -AVOID TAKING PRODUCTS CONTAINING BIOTIN (commonly found in Hair, Skin, Nails vitamins) AS IT INTERFERES WITH THE VALIDITY OF THYROID  FUNCTION BLOOD TESTS.  6) Vitamin D  deficiency Her recent vitamin d  level from 11/16/22 was 15.  She could benefit from replenishment with Ergocalciferol  50000 units po weekly x 12 weeks.  I did give her additional refills as I think she will need it for prolonged period of time.  She has since finished all refills.  Will recheck Vitamin D  prior to next visit.  7) Chronic Care/Health Maintenance: -she is not on ACEI/ARB and is on Statin medications and is encouraged to initiate and continue to follow up with Ophthalmology, Dentist, Podiatrist at least yearly or according to recommendations, and advised to stay away from smoking. I have recommended yearly flu vaccine and pneumonia vaccine at least every 5 years; moderate intensity exercise for up to 150 minutes weekly; and sleep  for at least 7 hours a day.  - she is advised to maintain close follow up with [place], Tenika, FNP for primary care needs, as well as her other providers for optimal and coordinated care.     I spent  30  minutes in the care of the patient today including review of labs from CMP, Lipids, Thyroid  Function, Hematology (current and previous including abstractions from other facilities); face-to-face time discussing  her blood glucose readings/logs, discussing hypoglycemia and hyperglycemia episodes and symptoms,  medications doses, her options of short and long term treatment based on the latest standards of care / guidelines;  discussion about incorporating lifestyle medicine;  and documenting the encounter. Risk reduction counseling performed per USPSTF guidelines to reduce obesity and cardiovascular risk factors.     Please refer to Patient Instructions for Blood Glucose Monitoring and Insulin /Medications Dosing Guide  in media tab for additional information. Please  also refer to  Patient Self Inventory in the Media  tab for reviewed elements of pertinent patient history.  Pete A Hymes participated in the discussions, expressed understanding, and voiced agreement with the above plans.  All questions were answered to her satisfaction. she is encouraged to contact clinic should she have any questions or concerns prior to her return visit.     Follow up plan: - Return in about 4 months (around 08/27/2024) for Diabetes F/U with A1c in office, Previsit labs, Bring meter and logs.  Benton Rio, [facility] [address] [place], [place] [address] Phone: [phone] Fax: [phone]  04/27/2024, 3:28 PM

## 2024-08-04 ENCOUNTER — Encounter (HOSPITAL_COMMUNITY): Payer: Self-pay | Admitting: Family Medicine

## 2024-08-04 ENCOUNTER — Other Ambulatory Visit: Payer: Self-pay

## 2024-08-04 ENCOUNTER — Emergency Department (HOSPITAL_COMMUNITY)

## 2024-08-04 ENCOUNTER — Observation Stay (HOSPITAL_COMMUNITY)
Admission: EM | Admit: 2024-08-04 | Discharge: 2024-08-05 | Disposition: A | Discharge status: Home / Self Care | Attending: Family Medicine | Admitting: Family Medicine

## 2024-08-04 DIAGNOSIS — N200 Calculus of kidney: Secondary | ICD-10-CM | POA: Diagnosis not present

## 2024-08-04 DIAGNOSIS — Q909 Down syndrome, unspecified: Secondary | ICD-10-CM | POA: Diagnosis not present

## 2024-08-04 DIAGNOSIS — K219 Gastro-esophageal reflux disease without esophagitis: Secondary | ICD-10-CM | POA: Diagnosis present

## 2024-08-04 DIAGNOSIS — K652 Spontaneous bacterial peritonitis: Secondary | ICD-10-CM | POA: Diagnosis present

## 2024-08-04 DIAGNOSIS — E1165 Type 2 diabetes mellitus with hyperglycemia: Secondary | ICD-10-CM | POA: Diagnosis not present

## 2024-08-04 DIAGNOSIS — K439 Ventral hernia without obstruction or gangrene: Secondary | ICD-10-CM | POA: Diagnosis not present

## 2024-08-04 DIAGNOSIS — E119 Type 2 diabetes mellitus without complications: Secondary | ICD-10-CM

## 2024-08-04 DIAGNOSIS — K589 Irritable bowel syndrome without diarrhea: Secondary | ICD-10-CM | POA: Diagnosis not present

## 2024-08-04 DIAGNOSIS — R739 Hyperglycemia, unspecified: Secondary | ICD-10-CM | POA: Diagnosis present

## 2024-08-04 DIAGNOSIS — K436 Other and unspecified ventral hernia with obstruction, without gangrene: Secondary | ICD-10-CM | POA: Diagnosis not present

## 2024-08-04 DIAGNOSIS — I700 Atherosclerosis of aorta: Secondary | ICD-10-CM | POA: Diagnosis not present

## 2024-08-04 DIAGNOSIS — E039 Hypothyroidism, unspecified: Secondary | ICD-10-CM | POA: Diagnosis not present

## 2024-08-04 DIAGNOSIS — K56600 Partial intestinal obstruction, unspecified as to cause: Principal | ICD-10-CM

## 2024-08-04 DIAGNOSIS — R109 Unspecified abdominal pain: Secondary | ICD-10-CM | POA: Diagnosis present

## 2024-08-04 DIAGNOSIS — R1010 Upper abdominal pain, unspecified: Secondary | ICD-10-CM | POA: Diagnosis present

## 2024-08-04 LAB — CBC
HCT: 42.1 % (ref 36.0–46.0)
Hemoglobin: 13.2 g/dL (ref 12.0–15.0)
MCH: 25.3 pg — ABNORMAL LOW (ref 26.0–34.0)
MCHC: 31.4 g/dL (ref 30.0–36.0)
MCV: 80.7 fL (ref 80.0–100.0)
Platelets: 240 K/uL (ref 150–400)
RBC: 5.22 MIL/uL — ABNORMAL HIGH (ref 3.87–5.11)
RDW: 15.9 % — ABNORMAL HIGH (ref 11.5–15.5)
WBC: 8.8 K/uL (ref 4.0–10.5)
nRBC: 0 % (ref 0.0–0.2)

## 2024-08-04 LAB — MAGNESIUM: Magnesium: 2 mg/dL (ref 1.7–2.4)

## 2024-08-04 LAB — URINALYSIS, ROUTINE W REFLEX MICROSCOPIC
Bilirubin Urine: NEGATIVE
Glucose, UA: >=500 mg/dL — AB
Ketones, ur: NEGATIVE mg/dL
Nitrite: NEGATIVE
Protein, ur: NEGATIVE mg/dL
Specific Gravity, Urine: 1.008 (ref 1.005–1.030)
pH: 5 (ref 5.0–8.0)

## 2024-08-04 LAB — COMPREHENSIVE METABOLIC PANEL WITH GFR
ALT: 15 U/L (ref 0–44)
AST: 24 U/L (ref 15–41)
Albumin: 4.1 g/dL (ref 3.5–5.0)
Alkaline Phosphatase: 98 U/L (ref 38–126)
Anion gap: 11 (ref 5–15)
BUN: 10 mg/dL (ref 6–20)
CO2: 26 mmol/L (ref 22–32)
Calcium: 9.6 mg/dL (ref 8.9–10.3)
Chloride: 97 mmol/L — ABNORMAL LOW (ref 98–111)
Creatinine, Ser: 0.96 mg/dL (ref 0.44–1.00)
GFR, Estimated: >60 mL/min (ref >60)
Glucose, Bld: 309 mg/dL — ABNORMAL HIGH (ref 70–99)
Potassium: 4.5 mmol/L (ref 3.5–5.1)
Sodium: 133 mmol/L — ABNORMAL LOW (ref 135–145)
Total Bilirubin: 1 mg/dL (ref 0.0–1.2)
Total Protein: 8 g/dL (ref 6.5–8.1)

## 2024-08-04 LAB — LIPASE, BLOOD: Lipase: 35 U/L (ref 11–51)

## 2024-08-04 LAB — HCG, SERUM, QUALITATIVE: Preg, Serum: NEGATIVE

## 2024-08-04 LAB — PHOSPHORUS: Phosphorus: 3.2 mg/dL (ref 2.5–4.6)

## 2024-08-04 LAB — HIV ANTIBODY (ROUTINE TESTING W REFLEX): HIV Screen 4th Generation wRfx: NONREACTIVE

## 2024-08-04 LAB — GLUCOSE, CAPILLARY
Glucose-Capillary: 257 mg/dL — ABNORMAL HIGH (ref 70–99)
Glucose-Capillary: 205 mg/dL — ABNORMAL HIGH (ref 70–99)
Glucose-Capillary: 180 mg/dL — ABNORMAL HIGH (ref 70–99)

## 2024-08-04 LAB — PROTIME-INR
INR: 1.1 (ref 0.8–1.2)
Prothrombin Time: 14.8 s (ref 11.4–15.2)

## 2024-08-04 LAB — CBG MONITORING, ED: Glucose-Capillary: 285 mg/dL — ABNORMAL HIGH (ref 70–99)

## 2024-08-04 LAB — HEMOGLOBIN A1C
Hgb A1c MFr Bld: 7 % — ABNORMAL HIGH (ref 4.8–5.6)
Mean Plasma Glucose: 154.2 mg/dL

## 2024-08-04 MED ORDER — FLEET ENEMA RE ENEM: 1.0000 | ENEMA | Freq: Once | RECTAL | Status: DC | PRN

## 2024-08-04 MED ORDER — IOHEXOL 300 MG/ML  SOLN
100.0000 mL | Freq: Once | INTRAMUSCULAR | Status: AC | PRN
  Administered 2024-08-04: 100 mL via INTRAVENOUS

## 2024-08-04 MED ORDER — FENTANYL CITRATE (PF) 100 MCG/2ML IJ SOLN
50.0000 ug | Freq: Once | INTRAMUSCULAR | Status: AC
  Administered 2024-08-04: 50 ug via INTRAVENOUS
  Filled 2024-08-04: qty 2

## 2024-08-04 MED ORDER — SENNOSIDES-DOCUSATE SODIUM 8.6-50 MG PO TABS: 1.0000 | ORAL_TABLET | Freq: Every evening | ORAL | Status: DC | PRN

## 2024-08-04 MED ORDER — SENNOSIDES 8.8 MG/5ML PO SYRP
5.0000 mL | ORAL_SOLUTION | Freq: Two times a day (BID) | ORAL | Status: DC
  Administered 2024-08-04: 5 mL via ORAL
  Filled 2024-08-04 (×4): qty 5

## 2024-08-04 MED ORDER — ACETAMINOPHEN 650 MG RE SUPP: 650.0000 mg | Freq: Four times a day (QID) | RECTAL | Status: DC | PRN

## 2024-08-04 MED ORDER — OXYCODONE HCL 5 MG PO TABS
5.0000 mg | ORAL_TABLET | ORAL | Status: DC | PRN
  Administered 2024-08-05: 5 mg via ORAL
  Filled 2024-08-04: qty 1

## 2024-08-04 MED ORDER — BISACODYL 5 MG PO TBEC: 5.0000 mg | DELAYED_RELEASE_TABLET | Freq: Every day | ORAL | Status: DC | PRN

## 2024-08-04 MED ORDER — SODIUM CHLORIDE 0.9 % IV SOLN: INTRAVENOUS | Status: AC

## 2024-08-04 MED ORDER — SODIUM CHLORIDE 0.9 % IV SOLN
12.5000 mg | Freq: Once | INTRAVENOUS | Status: AC
  Administered 2024-08-04: 12.5 mg via INTRAVENOUS
  Filled 2024-08-04: qty 0.5

## 2024-08-04 MED ORDER — LEVOTHYROXINE SODIUM 75 MCG PO TABS
150.0000 ug | ORAL_TABLET | Freq: Every day | ORAL | Status: DC
  Administered 2024-08-04 – 2024-08-05 (×2): 150 ug via ORAL
  Filled 2024-08-04 (×3): qty 2

## 2024-08-04 MED ORDER — ACETAMINOPHEN 325 MG PO TABS: 650.0000 mg | ORAL_TABLET | Freq: Four times a day (QID) | ORAL | Status: DC | PRN

## 2024-08-04 MED ORDER — TRAZODONE HCL 50 MG PO TABS
25.0000 mg | ORAL_TABLET | Freq: Every evening | ORAL | Status: DC | PRN
  Administered 2024-08-05: 25 mg via ORAL
  Filled 2024-08-04: qty 1

## 2024-08-04 MED ORDER — SODIUM CHLORIDE 0.9 % IV SOLN: 25.0000 mg | Freq: Three times a day (TID) | INTRAVENOUS | Status: DC | PRN

## 2024-08-04 MED ORDER — PANTOPRAZOLE SODIUM 40 MG IV SOLR
40.0000 mg | Freq: Two times a day (BID) | INTRAVENOUS | Status: DC
  Administered 2024-08-04 – 2024-08-05 (×3): 40 mg via INTRAVENOUS
  Filled 2024-08-04 (×3): qty 10

## 2024-08-04 MED ORDER — SODIUM CHLORIDE 0.9 % IV BOLUS
1000.0000 mL | Freq: Once | INTRAVENOUS | Status: AC
  Administered 2024-08-04: 1000 mL via INTRAVENOUS

## 2024-08-04 MED ORDER — SODIUM CHLORIDE 0.9 % IV SOLN: Freq: Once | INTRAVENOUS | Status: DC

## 2024-08-04 MED ORDER — METOCLOPRAMIDE HCL 5 MG/ML IJ SOLN
10.0000 mg | Freq: Three times a day (TID) | INTRAMUSCULAR | Status: DC
  Administered 2024-08-04 – 2024-08-05 (×3): 10 mg via INTRAVENOUS
  Filled 2024-08-04 (×3): qty 2

## 2024-08-04 MED ORDER — INSULIN ASPART 100 UNIT/ML IJ SOLN
0.0000 [IU] | Freq: Three times a day (TID) | INTRAMUSCULAR | Status: DC
  Administered 2024-08-04: 2 [IU] via SUBCUTANEOUS
  Administered 2024-08-04: 3 [IU] via SUBCUTANEOUS
  Administered 2024-08-05 (×2): 2 [IU] via SUBCUTANEOUS

## 2024-08-04 MED ORDER — SODIUM CHLORIDE 0.9% FLUSH
3.0000 mL | Freq: Two times a day (BID) | INTRAVENOUS | Status: DC
  Administered 2024-08-04 – 2024-08-05 (×3): 3 mL via INTRAVENOUS

## 2024-08-04 MED ORDER — SENNOSIDES 8.8 MG/5ML PO SYRP
5.0000 mL | ORAL_SOLUTION | Freq: Two times a day (BID) | ORAL | Status: DC
  Filled 2024-08-04 (×3): qty 5

## 2024-08-04 MED ORDER — LEVALBUTEROL HCL 0.63 MG/3ML IN NEBU: 0.6300 mg | INHALATION_SOLUTION | Freq: Four times a day (QID) | RESPIRATORY_TRACT | Status: DC | PRN

## 2024-08-04 MED ORDER — IPRATROPIUM BROMIDE 0.02 % IN SOLN: 0.5000 mg | Freq: Four times a day (QID) | RESPIRATORY_TRACT | Status: DC | PRN

## 2024-08-04 MED ORDER — SODIUM CHLORIDE 0.9% FLUSH
3.0000 mL | Freq: Two times a day (BID) | INTRAVENOUS | Status: DC
  Administered 2024-08-04 (×2): 3 mL via INTRAVENOUS

## 2024-08-04 MED ORDER — HYDRALAZINE HCL 20 MG/ML IJ SOLN: 10.0000 mg | INTRAMUSCULAR | Status: DC | PRN

## 2024-08-04 MED ORDER — HEPARIN SODIUM (PORCINE) 5000 UNIT/ML IJ SOLN
5000.0000 [IU] | Freq: Three times a day (TID) | INTRAMUSCULAR | Status: DC
  Filled 2024-08-04: qty 1

## 2024-08-04 MED ORDER — HYDROMORPHONE HCL 1 MG/ML IJ SOLN
0.5000 mg | INTRAMUSCULAR | Status: DC | PRN
  Administered 2024-08-04 (×2): 0.5 mg via INTRAVENOUS
  Filled 2024-08-04 (×2): qty 1

## 2024-08-04 NOTE — Plan of Care (Signed)
   Problem: Education: Goal: Knowledge of General Education information will improve Description Including pain rating scale, medication(s)/side effects and non-pharmacologic comfort measures Outcome: Progressing   Problem: Health Behavior/Discharge Planning: Goal: Ability to manage health-related needs will improve Outcome: Progressing

## 2024-08-04 NOTE — Assessment & Plan Note (Signed)
 Partial small bowel obstruction based on CT findings -Symptoms :epigastric pain associate with nausea vomiting 1 episode of diarrhea starting last night -History of ventral hernia with obstruction -Patient to be admitted for close observation, - N.p.o. IV fluid hydration - Will schedule IV Reglan , as needed Phenergan   -General Surgery consulted appreciate close follow-up  - CT abdomen/pelvis;  Moderately large bilobed ventral hernia at the inferior aspect of the patient's panniculus containing both herniated small bowel and colon with multiple transition points in the herniated small bowel loops. This is causing a partial small bowel obstruction with mildly diffusely dilated proximal and mid small bowel.

## 2024-08-04 NOTE — TOC CM/SW Note (Signed)
 Transition of Care [facility]) - Inpatient Brief Assessment   Patient Details  Name: Alexandra Kelley MRN: [id number] Date of Birth: May 09, 1981  [facility] of [facility] CM/SW Contact:    Lucie Lunger, LCSWA Phone Number: 08/04/2024, 2:29 PM   Clinical Narrative: Transition of Care Department [facility]) has reviewed patient and no TOC needs have been identified at this time. We will continue to monitor patient advancement through interdiciplinary progression rounds. If new patient transition needs arise, please place a TOC consult.  Transition of Care Asessment: Insurance and Status: Insurance coverage has been reviewed Patient has primary care physician: Yes Home environment has been reviewed: From home Prior level of function:: Independent Prior/Current Home Services: No current home services Social Drivers of Health Review: SDOH reviewed no interventions necessary Readmission risk has been reviewed: Yes Transition of care needs: no transition of care needs at this time

## 2024-08-04 NOTE — Assessment & Plan Note (Signed)
-   Change PPI to IV Protonix  40 mg twice daily

## 2024-08-04 NOTE — Assessment & Plan Note (Signed)
-   Due to chronic bilateral ventral hernia, and small bowel obstruction Associated with nausea vomiting -continue scheduled IV antiemetics -As needed IV analgesics

## 2024-08-04 NOTE — Hospital Course (Signed)
 Ryian A Ruggiero is a 43 year old female with a history of developmental disorder, delay, Down syndrome DM II, hypothyroidism... Presented to ED accompanied by her mother with complaint of upper abdominal pain, nausea, vomiting.  Symptoms started yesterday evening.  1 episode of diarrhea, nonbloody.  No recent illnesses, or sick contact, denies fever or chills.  Denies having dysuria.     ED evaluation:  Blood pressure 120/62, pulse (!) 112, temperature 99.1 F (37.3 C), temperature source Oral, height 5' 1 (1.549 m), weight 136.1 kg, SpO2 99%.  LABs; sodium 133, chloride 97 glucose 285, 309, WBC 8.8, hemoglobin 13.2, UA: Leukocyte esterase, rare bacteria, WBC 11-20 CT abdomen/pelvis;  Moderately large bilobed ventral hernia at the inferior aspect of the patient's panniculus containing both herniated small bowel and colon with multiple transition points in the herniated small bowel loops. This is causing a partial small bowel obstruction with mildly diffusely dilated proximal and mid small bowel. 2. Bilateral nonobstructing renal calculi. 3. Calcific coronary artery atherosclerosis.

## 2024-08-04 NOTE — Assessment & Plan Note (Signed)
-   Once tolerating p.o., continuing home dose Synthroid  -Check TSH

## 2024-08-04 NOTE — H&P (Signed)
 History and Physical   Patient: Alexandra Kelley                            PCP: Vick Lurie, FNP (Inactive)                    DOB: 01/24/1981            DOA: 08/04/2024 FMW:[id number]             DOS: 08/04/2024, 11:47 AM  Vick Lurie, FNP (Inactive)  Patient coming from:   HOME  I have personally reviewed patient's medical records, in electronic medical records, including:  [facility], and care everywhere.    Chief Complaint:   Chief Complaint  Patient presents with   Emesis   Diarrhea   Abdominal Pain    History of present illness:    Alexandra Kelley is a 43 year old female with a history of developmental disorder, delay, Down syndrome DM II, hypothyroidism... Presented to ED accompanied by her mother with complaint of upper abdominal pain, nausea, vomiting.  Symptoms started yesterday evening.  1 episode of diarrhea, nonbloody.  No recent illnesses, or sick contact, denies fever or chills.  Denies having dysuria.     ED evaluation:  Blood pressure 120/62, pulse (!) 112, temperature 99.1 F (37.3 C), temperature source Oral, height 5' 1 (1.549 m), weight 136.1 kg, SpO2 99%.  LABs; sodium 133, chloride 97 glucose 285, 309, WBC 8.8, hemoglobin 13.2, UA: Leukocyte esterase, rare bacteria, WBC 11-20 CT abdomen/pelvis;  Moderately large bilobed ventral hernia at the inferior aspect of the patient's panniculus containing both herniated small bowel and colon with multiple transition points in the herniated small bowel loops. This is causing a partial small bowel obstruction with mildly diffusely dilated proximal and mid small bowel. 2. Bilateral nonobstructing renal calculi. 3. Calcific coronary artery atherosclerosis.    Patient Denies having: Fever, Chills, Cough, SOB, Chest Pain, Abd pain, N/V/D, headache, dizziness, lightheadedness,  Dysuria, Joint pain, rash, open wounds    Review of Systems: As per HPI, otherwise 10 point review of systems were  negative.   ----------------------------------------------------------------------------------------------------------------------  Allergies  Allergen Reactions   5-Alpha Reductase Inhibitors    Canagliflozin Other (See Comments)    Severe yeast infections   Ibuprofen Other (See Comments)    GI   Melatonin Other (See Comments)   Metformin  And Related     GI distress   Onglyza [Saxagliptin]     headaches   Statins Other (See Comments)   Zofran  [Ondansetron  Hcl] Other (See Comments)    Itchy rash in mouth   Cefaclor Rash    Rash   Penicillins Rash   Pregabalin  Itching and Rash   Sitagliptin Rash    Home MEDs:  Prior to Admission medications   Medication Sig Start Date End Date Taking? Authorizing Provider  dicyclomine  (BENTYL ) 10 MG capsule Take 1 capsule (10 mg total) by mouth 3 (three) times daily as needed for spasms. Patient taking differently: Take 10 mg by mouth daily. 10/18/18  Yes Antoinette Doe, MD  gabapentin  (NEURONTIN ) 300 MG capsule Take 300 mg by mouth 3 (three) times daily. 03/27/16  Yes [provider]  glipiZIDE  (GLUCOTROL  XL) 2.5 MG 24 hr tablet Take 1 tablet (2.5 mg total) by mouth daily with breakfast. 04/27/24  Yes Therisa Benton PARAS, NP  hydrOXYzine (ATARAX/VISTARIL) 25 MG tablet Take 25 mg by mouth 2 (two) times daily as needed for  itching or anxiety. 02/22/20  Yes [provider]  levothyroxine  (SYNTHROID ) 150 MCG tablet Take 1 tablet (150 mcg total) by mouth daily before breakfast. 04/27/24  Yes Therisa Benton PARAS, NP  nystatin  cream (MYCOSTATIN ) APPLY TO AFFECTED AREA TWICE A DAY 03/09/24  Yes Signa Nest A, NP  pantoprazole  (PROTONIX ) 40 MG tablet TAKE 1 TABLET BY MOUTH DAILY BEFORE BREAKFAST 03/06/21  Yes Rehman, Claudis PENNER, MD  traMADol  (ULTRAM ) 50 MG tablet TAKE 1 TABLET BY MOUTH EVERY 12 HOURS AS NEEDED Patient taking differently: Take 50 mg by mouth 2 (two) times daily. 08/03/17  Yes Onita Duos, MD    PRN MEDs: acetaminophen  **OR**  acetaminophen , bisacodyl , hydrALAZINE, HYDROmorphone  (DILAUDID ) injection, ipratropium, levalbuterol, oxyCODONE , promethazine  (PHENERGAN ) injection (IM or IVPB), senna-docusate, sodium phosphate , traZODone   Past Medical History:  Diagnosis Date   Arthritis    back   Carpal tunnel syndrome    both wrists   Diabetes mellitus without complication (HCC)    Dr. Adel   GERD (gastroesophageal reflux disease)    History of kidney stones    Hyperlipidemia    Hypothyroidism    Kidney stone    Neuropathy    feet and hands   Thyroid  disease     Past Surgical History:  Procedure Laterality Date   BACK SURGERY  1990s   CYSTOSCOPY/URETEROSCOPY/HOLMIUM LASER/STENT PLACEMENT Right 06/20/2020   Procedure: CYSTOSCOPY, RIGHT RETROGRADE /STENT PLACEMENT;  Surgeon: Watt Rush, MD;  Location: [facility];  Service: Urology;  Laterality: Right;   CYSTOSCOPY/URETEROSCOPY/HOLMIUM LASER/STENT PLACEMENT Right 07/11/2020   Procedure: CYSTOSCOPY RIGHT URETEROSCOPY/HOLMIUM LASER/STENT EXCHANGE;  Surgeon: Watt Rush, MD;  Location: [facility];  Service: Urology;  Laterality: Right;   FEMUR IM NAIL Left 10/03/2013   Procedure: INTRAMEDULLARY (IM) RETROGRADE FEMORAL NAILING;  Surgeon: Oneil JAYSON Herald, MD;  Location: [facility];  Service: Orthopedics;  Laterality: Left;   tubes in ears       reports that she has never smoked. She has never used smokeless tobacco. She reports that she does not drink alcohol and does not use drugs.   Family History  Problem Relation Age of Onset   Thyroid  disease Mother    Hyperlipidemia Mother    Kidney Stones Father    Thyroid  disease Maternal Grandmother    Other Maternal Grandmother        heart issue   Heart attack Maternal Grandfather    Diabetes Maternal Grandfather    Colon cancer Maternal Grandfather    Cancer Maternal Grandfather        prostate   Heart attack Paternal Grandmother     Physical Exam:   Vitals:   08/04/24 0709 08/04/24 0711 08/04/24 1137  BP:  120/62    Pulse:  (!) 112   Temp:  99.1 F (37.3 C) 98.1 F (36.7 C)  TempSrc:  Oral Oral  SpO2:  99%   Weight: 136.1 kg    Height: 5' 1 (1.549 m)     Constitutional: NAD, calm, comfortable Eyes: PERRL, lids and conjunctivae normal ENMT: Mucous membranes are moist. Posterior pharynx clear of any exudate or lesions.Normal dentition.  Neck: normal, supple, no masses, no thyromegaly Respiratory: clear to auscultation bilaterally, no wheezing, no crackles. Normal respiratory effort. No accessory muscle use.  Cardiovascular: Regular rate and rhythm, no murmurs / rubs / gallops. No extremity edema. 2+ pedal pulses. No carotid bruits.  Abdomen: no tenderness, no masses palpated. No hepatosplenomegaly. Bowel sounds positive.  Musculoskeletal: no clubbing / cyanosis. No joint deformity upper and lower extremities.  Good ROM, no contractures. Normal muscle tone.  Neurologic: CN II-XII grossly intact. Sensation intact, DTR normal. Strength 5/5 in all 4.  Psychiatric: Normal judgment and insight. Alert and oriented x 3. Normal mood.  Skin: no rashes, lesions, ulcers. No induration Decubitus/ulcers:  Wounds: per nursing documentation         Labs on admission:    I have personally reviewed following labs and imaging studies  CBC: Recent Labs  Lab 08/04/24 0752  WBC 8.8  HGB 13.2  HCT 42.1  MCV 80.7  PLT 240   Basic Metabolic Panel: Recent Labs  Lab 08/04/24 0850  NA 133*  K 4.5  CL 97*  CO2 26  GLUCOSE 309*  BUN 10  CREATININE 0.96  CALCIUM 9.6   GFR: Estimated Creatinine Clearance: 102.2 mL/min (by C-G formula based on SCr of 0.96 mg/dL). Liver Function Tests: Recent Labs  Lab 08/04/24 0850  AST 24  ALT 15  ALKPHOS 98  BILITOT 1.0  PROT 8.0  ALBUMIN 4.1   Recent Labs  Lab 08/04/24 0850  LIPASE 35    CBG: Recent Labs  Lab 08/04/24 0717  GLUCAP 285*    Urine analysis:    Component Value Date/Time   COLORURINE YELLOW 08/04/2024 0814   APPEARANCEUR CLEAR  08/04/2024 0814   APPEARANCEUR Clear 07/08/2023 1524   LABSPEC 1.008 08/04/2024 0814   PHURINE 5.0 08/04/2024 0814   GLUCOSEU >=500 (A) 08/04/2024 0814   HGBUR MODERATE (A) 08/04/2024 0814   BILIRUBINUR NEGATIVE 08/04/2024 0814   BILIRUBINUR Negative 07/08/2023 1524   KETONESUR NEGATIVE 08/04/2024 0814   PROTEINUR NEGATIVE 08/04/2024 0814   UROBILINOGEN negative (A) 05/17/2020 1445   UROBILINOGEN 0.2 10/16/2013 1224   NITRITE NEGATIVE 08/04/2024 0814   LEUKOCYTESUR SMALL (A) 08/04/2024 0814    Last A1C:  Lab Results  Component Value Date   HGBA1C 7.3 (A) 04/27/2024     Radiologic Exams on Admission:   CT ABDOMEN PELVIS W CONTRAST Result Date: 08/04/2024 CLINICAL DATA:  Acute, non localized abdominal pain since 4 a.m. today. Associated vomiting and diarrhea. EXAM: CT ABDOMEN AND PELVIS WITH CONTRAST TECHNIQUE: Multidetector CT imaging of the abdomen and pelvis was performed using the standard protocol following bolus administration of intravenous contrast. RADIATION DOSE REDUCTION: This exam was performed according to the departmental dose-optimization program which includes automated exposure control, adjustment of the mA and/or kV according to patient size and/or use of iterative reconstruction technique. CONTRAST:  OMNIPAQUE  IOHEXOL  300 MG/ML  SOLN COMPARISON:  12/25/2022 FINDINGS: Lower chest: Stable elevated left hemidiaphragm. Normal-sized heart. Coronary artery calcifications. Hepatobiliary: No focal liver abnormality is seen. No gallstones, gallbladder wall thickening, or biliary dilatation. Pancreas: Unremarkable. No pancreatic ductal dilatation or surrounding inflammatory changes. Spleen: Normal in size without focal abnormality. Adrenals/Urinary Tract: Stable 5 mm mid to lower left renal calculus without hydronephrosis. 8 and 5 mm right renal pelvis calculi. 4 mm mid right renal calculus and 6 mm lower pole right renal calculus without hydronephrosis. Normal-appearing  adrenal glands, ureters and urinary bladder. Stomach/Bowel: Mildly diffusely dilated proximal and mid small bowel with normal caliber distal small bowel and colon. There are several points of transition to small caliber loops within moderately large bilobed ventral hernia at the inferior aspect of the patient's panniculus. There is edema in the subcutaneous fat adjacent to the hernia. Normal-appearing appendix and stomach. Vascular/Lymphatic: Minimal atheromatous arterial calcifications without aneurysm. No enlarged lymph nodes. Reproductive: Uterus and bilateral adnexa are unremarkable. Other: Moderately large bilobed ventral hernia at  the inferior aspect of the patient's pannus, as described above. This contains both herniated small bowel and colon with multiple transition points in the herniated small bowel loops. Musculoskeletal: Left femur fixation hardware. Dense calcifications or injected material in the medial aspects of the gluteus maximus muscles bilaterally, unchanged moderate lumbar and lower thoracic spine degenerative changes with mild-to-moderate scoliosis. IMPRESSION: 1. Moderately large bilobed ventral hernia at the inferior aspect of the patient's panniculus containing both herniated small bowel and colon with multiple transition points in the herniated small bowel loops. This is causing a partial small bowel obstruction with mildly diffusely dilated proximal and mid small bowel. 2. Bilateral nonobstructing renal calculi. 3. Calcific coronary artery atherosclerosis. Electronically Signed   By: Elspeth Bathe M.D.   On: 08/04/2024 10:53    EKG:   Independently reviewed.  Orders placed or performed during the hospital encounter of 08/04/24   EKG 12-Lead   ---------------------------------------------------------------------------------------------------------------------------------------    Assessment / Plan:   Principal Problem:   Ventral hernia without obstruction or  gangrene Active Problems:   Hypothyroidism   Upper abdominal pain   Down's syndrome   Diabetes (HCC)   Hyperglycemia   IBS (irritable bowel syndrome)   GERD (gastroesophageal reflux disease)   Assessment and Plan: * Ventral hernia without obstruction or gangrene Partial small bowel obstruction based on CT findings -Symptoms :epigastric pain associate with nausea vomiting 1 episode of diarrhea starting last night -History of ventral hernia with obstruction -Patient to be admitted for close observation, - N.p.o. IV fluid hydration - Will schedule IV Reglan , as needed Phenergan   -General Surgery consulted appreciate close follow-up  - CT abdomen/pelvis;  Moderately large bilobed ventral hernia at the inferior aspect of the patient's panniculus containing both herniated small bowel and colon with multiple transition points in the herniated small bowel loops. This is causing a partial small bowel obstruction with mildly diffusely dilated proximal and mid small bowel.   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 -Mother POA at bedside-obtaining history, plan of care discussed -Currently stable  Upper abdominal pain - Due to chronic bilateral ventral hernia, and small bowel obstruction Associated with nausea vomiting -continue scheduled IV antiemetics -As needed IV analgesics  Hypothyroidism - Once tolerating p.o., continuing home dose Synthroid  -Check TSH  IBS (irritable bowel syndrome) - History of irritable bowel syndrome -Now associated with spontaneous small bowel obstruction, diarrhea x 1 episode -Monitoring closely -N.p.o.  Hyperglycemia - With history of DM2 uncontrolled, continue IV fluids, SSI coverage -CBG every 4 hours  Diabetes (HCC) History of DM 2-uncontrolled with last A1c of 7.3 -Holding home regimen -Checking CBG every 4 hours, SSI coverage, while NPO -Continue IV fluids  GERD (gastroesophageal reflux disease) - Change PPI to IV Protonix   40 mg twice daily              Consults called: General Surgery Dr. Mavis -------------------------------------------------------------------------------------------------------------------------------------------- DVT prophylaxis:  Mom requested patient not to receive heparin  SCDs Start: 08/04/24 1128   Code Status:   Code Status: Full Code   Admission status: Patient will be admitted as Observation, with a greater than 2 midnight length of stay. Level of care: Med-Surg   Family Communication:  none at bedside  (The above findings and plan of care has been discussed with patient in detail, the patient expressed understanding and agreement of above plan)  --------------------------------------------------------------------------------------------------------------------------------------------------  Disposition Plan:  Anticipated 1-2 days Status is: Observation The patient remains OBS appropriate and will d/c before 2 midnights.     ----------------------------------------------------------------------------------------------------------------------------------------------------  Time spent:  81  Min.  Was spent seeing and evaluating the patient, reviewing all medical records, drawn plan of care.  SIGNED: Adriana DELENA Grams, MD, FHM. FAAFP. [facility], Pager  (Please use [email or web address] to page/ or secure chat through epic) If 7PM-7AM, please contact night-coverage [email or web address],  08/04/2024, 11:47 AM

## 2024-08-04 NOTE — ED Provider Notes (Signed)
 [facility] AT [facility] Provider Note   CSN: [id number] Arrival date & time: 08/04/24  9352     Patient presents with: Emesis, Diarrhea, and Abdominal Pain   Alexandra Kelley is a 43 y.o. female.  She has a history of developmental delay and her mother is here giving history.  Has had vomiting and diarrhea, upper abdominal pain since last evening.  No blood in the vomit or diarrhea.  No clear fever.  No urinary symptoms.  No sick contacts or recent travel.  Have tried nothing for her symptoms.  Level 5 caveat secondary to fund of knowledge   The history is provided by the patient and a parent. The history is limited by a developmental delay.  Abdominal Pain Pain location:  Epigastric Duration:  2 days Chronicity:  New Relieved by:  None tried Associated symptoms: diarrhea, nausea and vomiting   Associated symptoms: no chest pain, no dysuria, no fever, no hematemesis, no hematochezia, no hematuria and no shortness of breath        Prior to Admission medications   Medication Sig Start Date End Date Taking? Authorizing Provider  dicyclomine  (BENTYL ) 10 MG capsule Take 1 capsule (10 mg total) by mouth 3 (three) times daily as needed for spasms. 10/18/18   Antoinette Doe, MD  gabapentin  (NEURONTIN ) 300 MG capsule Take 300 mg by mouth 3 (three) times daily. 03/27/16   [provider]  glipiZIDE  (GLUCOTROL  XL) 2.5 MG 24 hr tablet Take 1 tablet (2.5 mg total) by mouth daily with breakfast. 04/27/24   Therisa Benton PARAS, NP  glucose blood (ACCU-CHEK AVIVA PLUS) test strip Use as instructed to monitor glucose 3 times daily 08/05/23   Therisa Benton PARAS, NP  glucose blood test strip See admin instructions. 04/21/21   [provider]  hydrOXYzine (ATARAX/VISTARIL) 25 MG tablet Take 25 mg by mouth 2 (two) times daily as needed for itching or anxiety. 02/22/20   [provider]  levothyroxine  (SYNTHROID ) 150 MCG tablet Take 1 tablet (150  mcg total) by mouth daily before breakfast. 04/27/24   Therisa Benton PARAS, NP  nystatin  cream (MYCOSTATIN ) APPLY TO AFFECTED AREA TWICE A DAY 03/09/24   Signa Delon LABOR, NP  pantoprazole  (PROTONIX ) 40 MG tablet TAKE 1 TABLET BY MOUTH DAILY BEFORE BREAKFAST 03/06/21   Rehman, Claudis PENNER, MD  traMADol  (ULTRAM ) 50 MG tablet TAKE 1 TABLET BY MOUTH EVERY 12 HOURS AS NEEDED 08/03/17   Onita Duos, MD    Allergies: 5-alpha reductase inhibitors, Canagliflozin, Melatonin, Metformin , Metformin  and related, Onglyza [saxagliptin], Statins, Zofran  [ondansetron  hcl], Cefaclor, Penicillins, Pregabalin , and Sitagliptin    Review of Systems  Constitutional:  Negative for fever.  Respiratory:  Negative for shortness of breath.   Cardiovascular:  Negative for chest pain.  Gastrointestinal:  Positive for abdominal pain, diarrhea, nausea and vomiting. Negative for hematemesis and hematochezia.  Genitourinary:  Negative for dysuria and hematuria.    Updated Vital Signs BP 120/62 (BP Location: Left Arm)   Pulse (!) 112   Temp 99.1 F (37.3 C) (Oral)   Ht 5' 1 (1.549 m)   Wt 136.1 kg   SpO2 99%   BMI 56.68 kg/m   Physical Exam Vitals and nursing note reviewed.  Constitutional:      General: She is not in acute distress.    Appearance: She is well-developed. She is obese.  HENT:     Head: Normocephalic and atraumatic.  Eyes:     Conjunctiva/sclera: Conjunctivae normal.  Cardiovascular:     Rate and Rhythm: Normal rate and regular rhythm.     Heart sounds: No murmur heard. Pulmonary:     Effort: Pulmonary effort is normal. No respiratory distress.     Breath sounds: Normal breath sounds.  Abdominal:     Palpations: Abdomen is soft.     Tenderness: There is no abdominal tenderness. There is no guarding or rebound.  Musculoskeletal:        General: No swelling.     Cervical back: Neck supple.  Skin:    General: Skin is warm and dry.     Capillary Refill: Capillary refill takes less than 2 seconds.   Neurological:     General: No focal deficit present.     Mental Status: She is alert.     (all labs ordered are listed, but only abnormal results are displayed) Labs Reviewed  CBC - Abnormal; Notable for the following components:      Result Value   RBC 5.22 (*)    MCH 25.3 (*)    RDW 15.9 (*)    All other components within normal limits  URINALYSIS, ROUTINE W REFLEX MICROSCOPIC - Abnormal; Notable for the following components:   Glucose, UA >=500 (*)    Hgb urine dipstick MODERATE (*)    Leukocytes,Ua SMALL (*)    Bacteria, UA RARE (*)    All other components within normal limits  COMPREHENSIVE METABOLIC PANEL WITH GFR - Abnormal; Notable for the following components:   Sodium 133 (*)    Chloride 97 (*)    Glucose, Bld 309 (*)    All other components within normal limits  HEMOGLOBIN A1C - Abnormal; Notable for the following components:   Hgb A1c MFr Bld 7.0 (*)    All other components within normal limits  GLUCOSE, CAPILLARY - Abnormal; Notable for the following components:   Glucose-Capillary 257 (*)    All other components within normal limits  CBG MONITORING, ED - Abnormal; Notable for the following components:   Glucose-Capillary 285 (*)    All other components within normal limits  GASTROINTESTINAL PANEL BY PCR, STOOL (REPLACES STOOL CULTURE)  EXPECTORATED SPUTUM ASSESSMENT W GRAM STAIN, RFLX TO RESP C  HCG, SERUM, QUALITATIVE  LIPASE, BLOOD  MAGNESIUM  PHOSPHORUS  PROTIME-INR  HIV ANTIBODY (ROUTINE TESTING W REFLEX)  POC URINE PREG, ED    EKG: None  Radiology: CT ABDOMEN PELVIS W CONTRAST Result Date: 08/04/2024 CLINICAL DATA:  Acute, non localized abdominal pain since 4 a.m. today. Associated vomiting and diarrhea. EXAM: CT ABDOMEN AND PELVIS WITH CONTRAST TECHNIQUE: Multidetector CT imaging of the abdomen and pelvis was performed using the standard protocol following bolus administration of intravenous contrast. RADIATION DOSE REDUCTION: This exam was  performed according to the departmental dose-optimization program which includes automated exposure control, adjustment of the mA and/or kV according to patient size and/or use of iterative reconstruction technique. CONTRAST:  OMNIPAQUE  IOHEXOL  300 MG/ML  SOLN COMPARISON:  12/25/2022 FINDINGS: Lower chest: Stable elevated left hemidiaphragm. Normal-sized heart. Coronary artery calcifications. Hepatobiliary: No focal liver abnormality is seen. No gallstones, gallbladder wall thickening, or biliary dilatation. Pancreas: Unremarkable. No pancreatic ductal dilatation or surrounding inflammatory changes. Spleen: Normal in size without focal abnormality. Adrenals/Urinary Tract: Stable 5 mm mid to lower left renal calculus without hydronephrosis. 8 and 5 mm right renal pelvis calculi. 4 mm mid right renal calculus and 6 mm lower pole right renal calculus without hydronephrosis. Normal-appearing adrenal glands, ureters and urinary bladder. Stomach/Bowel: Mildly  diffusely dilated proximal and mid small bowel with normal caliber distal small bowel and colon. There are several points of transition to small caliber loops within moderately large bilobed ventral hernia at the inferior aspect of the patient's panniculus. There is edema in the subcutaneous fat adjacent to the hernia. Normal-appearing appendix and stomach. Vascular/Lymphatic: Minimal atheromatous arterial calcifications without aneurysm. No enlarged lymph nodes. Reproductive: Uterus and bilateral adnexa are unremarkable. Other: Moderately large bilobed ventral hernia at the inferior aspect of the patient's pannus, as described above. This contains both herniated small bowel and colon with multiple transition points in the herniated small bowel loops. Musculoskeletal: Left femur fixation hardware. Dense calcifications or injected material in the medial aspects of the gluteus maximus muscles bilaterally, unchanged moderate lumbar and lower thoracic spine  degenerative changes with mild-to-moderate scoliosis. IMPRESSION: 1. Moderately large bilobed ventral hernia at the inferior aspect of the patient's panniculus containing both herniated small bowel and colon with multiple transition points in the herniated small bowel loops. This is causing a partial small bowel obstruction with mildly diffusely dilated proximal and mid small bowel. 2. Bilateral nonobstructing renal calculi. 3. Calcific coronary artery atherosclerosis. Electronically Signed   By: Elspeth Bathe M.D.   On: 08/04/2024 10:53     Procedures   Medications Ordered in the ED  sodium chloride  flush (NS) 0.9 % injection 3 mL (3 mLs Intravenous Given 08/04/24 1138)  0.9 %  sodium chloride  infusion ( Intravenous Infusion Verify 08/04/24 1544)  sodium chloride  flush (NS) 0.9 % injection 3 mL (3 mLs Intravenous Given 08/04/24 1137)  acetaminophen  (TYLENOL ) tablet 650 mg (has no administration in time range)    Or  acetaminophen  (TYLENOL ) suppository 650 mg (has no administration in time range)  oxyCODONE  (Oxy IR/ROXICODONE ) immediate release tablet 5 mg (has no administration in time range)  HYDROmorphone  (DILAUDID ) injection 0.5-1 mg (0.5 mg Intravenous Given 08/04/24 1606)  traZODone  (DESYREL ) tablet 25 mg (has no administration in time range)  senna-docusate (Senokot-S) tablet 1 tablet (has no administration in time range)  bisacodyl  (DULCOLAX) EC tablet 5 mg (has no administration in time range)  sodium phosphate  (FLEET) enema 1 enema (has no administration in time range)  ipratropium (ATROVENT) nebulizer solution 0.5 mg (has no administration in time range)  levalbuterol (XOPENEX) nebulizer solution 0.63 mg (has no administration in time range)  hydrALAZINE (APRESOLINE) injection 10 mg (has no administration in time range)  metoCLOPramide  (REGLAN ) injection 10 mg (10 mg Intravenous Given 08/04/24 1446)  promethazine  (PHENERGAN ) 25 mg in sodium chloride  0.9 % 50 mL IVPB (has no administration  in time range)  levothyroxine  (SYNTHROID ) tablet 150 mcg (150 mcg Oral Given 08/04/24 1438)  insulin  aspart (novoLOG ) injection 0-6 Units (3 Units Subcutaneous Given 08/04/24 1248)  pantoprazole  (PROTONIX ) injection 40 mg (40 mg Intravenous Given 08/04/24 1248)  sennosides (SENOKOT) 8.8 MG/5ML syrup 5 mL (has no administration in time range)  sodium chloride  0.9 % bolus 1,000 mL (1,000 mLs Intravenous Bolus from Bag 08/04/24 0829)  promethazine  (PHENERGAN ) 12.5 mg in sodium chloride  0.9 % 50 mL IVPB (0 mg Intravenous Stopped 08/04/24 0903)  iohexol  (OMNIPAQUE ) 300 MG/ML solution 100 mL (100 mLs Intravenous Contrast Given 08/04/24 1009)  fentaNYL  (SUBLIMAZE ) injection 50 mcg (50 mcg Intravenous Given 08/04/24 1131)  promethazine  (PHENERGAN ) 12.5 mg in sodium chloride  0.9 % 50 mL IVPB (0 mg Intravenous Stopped 08/04/24 1302)    Clinical Course as of 08/04/24 1624  Fri Aug 04, 2024  1119 I reviewed patient's CT with Dr. Mavis.  He is asking for the patient to get medically admitted and he will see her in consult.  Her reviewed all this with patient and her mother.  They are comfortable plan for admission. [MB]  1128 Discussed with [facility] hospitalist Dr. Willette who will evaluate patient for admission [MB]    Clinical Course User Index [MB] Towana Ozell BROCKS, MD                                 Medical Decision Making Amount and/or Complexity of Data Reviewed Labs: ordered. Radiology: ordered.  Risk Prescription drug management. Decision regarding hospitalization.   This patient complains of upper abdominal pain vomiting diarrhea; this involves an extensive number of treatment Options and is a complaint that carries with it a high risk of complications and morbidity. The differential includes peptic ulcer disease, gastroenteritis, obstruction, diverticulitis, colitis, biliary colic  I ordered, reviewed and interpreted labs, which included CBC stable, chemistries and LFTs with elevated  glucose, urinalysis without clear signs of infection I ordered medication IV fluids and pain medicine nausea medication and reviewed PMP when indicated. I ordered imaging studies which included CT abdomen and pelvis and I independently    visualized and interpreted imaging which showed partial small bowel obstruction with ventral hernia Additional history obtained from patient's mother Previous records obtained and reviewed in epic including recent PCP neurology and urology notes I consulted Dr. Mavis general surgery Dr. Willette [facility] hospitalist and discussed lab and imaging findings and discussed disposition.  Cardiac monitoring reviewed, sinus rhythm Social determinants considered, no significant barriers Critical Interventions: None  After the interventions stated above, I reevaluated the patient and found patient still to be nauseous but not vomiting and has a benign abdominal exam Admission and further testing considered, she will be admitted to the hospital for serial abdominal exams and general surgery consult.  She is in agreement plan for admission.      Final diagnoses:  Partial small bowel obstruction (HCC)  Ventral hernia with bowel obstruction    ED Discharge Orders     None          Towana Ozell BROCKS, MD 08/04/24 1627

## 2024-08-04 NOTE — Assessment & Plan Note (Signed)
-   History of irritable bowel syndrome -Now associated with spontaneous small bowel obstruction, diarrhea x 1 episode -Monitoring closely -N.p.o.

## 2024-08-04 NOTE — ED Triage Notes (Signed)
 Pt c/o v/d since 4 am this morning. Pt also c/o abd pain since 4 am.

## 2024-08-04 NOTE — Assessment & Plan Note (Signed)
 History of DM 2-uncontrolled with last A1c of 7.3 -Holding home regimen -Checking CBG every 4 hours, SSI coverage, while NPO -Continue IV fluids

## 2024-08-04 NOTE — Assessment & Plan Note (Signed)
-   With history of DM2 uncontrolled, continue IV fluids, SSI coverage -CBG every 4 hours

## 2024-08-04 NOTE — Consult Note (Signed)
 Reason for Consult: Nausea, vomiting, diarrhea Referring Physician: Dr. Willette Samule Alexandra Kelley is an 43 y.o. female.  HPI: Patient is a 43 year old morbidly obese white female with Down syndrome who presents with a less than 24-hour history of nausea, vomiting, and diarrhea.  This apparently started earlier this morning.  Mother states that she has had a history of loose stools in the past.  She has never had abdominal surgery.  She does have a history of cholelithiasis.  She was seen by our office in 2024 for a similar issue.  She does have a history of IBS.  She was noted at that time to have a loss of abdominal wall domain extending down to her pannus with a reducible large hernia defect.  Patient denies any abdominal pain.  She denies any blood in her stools.  She denies any dysuria.  Past Medical History:  Diagnosis Date   Arthritis    back   Carpal tunnel syndrome    both wrists   Diabetes mellitus without complication (HCC)    Dr. Adel   GERD (gastroesophageal reflux disease)    History of kidney stones    Hyperlipidemia    Hypothyroidism    Kidney stone    Neuropathy    feet and hands   Thyroid  disease     Past Surgical History:  Procedure Laterality Date   BACK SURGERY  1990s   CYSTOSCOPY/URETEROSCOPY/HOLMIUM LASER/STENT PLACEMENT Right 06/20/2020   Procedure: CYSTOSCOPY, RIGHT RETROGRADE /STENT PLACEMENT;  Surgeon: Watt Rush, MD;  Location: [facility];  Service: Urology;  Laterality: Right;   CYSTOSCOPY/URETEROSCOPY/HOLMIUM LASER/STENT PLACEMENT Right 07/11/2020   Procedure: CYSTOSCOPY RIGHT URETEROSCOPY/HOLMIUM LASER/STENT EXCHANGE;  Surgeon: Watt Rush, MD;  Location: [facility];  Service: Urology;  Laterality: Right;   FEMUR IM NAIL Left 10/03/2013   Procedure: INTRAMEDULLARY (IM) RETROGRADE FEMORAL NAILING;  Surgeon: Oneil JAYSON Herald, MD;  Location: [facility];  Service: Orthopedics;  Laterality: Left;   tubes in ears      Family History  Problem Relation Age of Onset    Thyroid  disease Mother    Hyperlipidemia Mother    Kidney Stones Father    Thyroid  disease Maternal Grandmother    Other Maternal Grandmother        heart issue   Heart attack Maternal Grandfather    Diabetes Maternal Grandfather    Colon cancer Maternal Grandfather    Cancer Maternal Grandfather        prostate   Heart attack Paternal Grandmother     Social History:  reports that she has never smoked. She has never used smokeless tobacco. She reports that she does not drink alcohol and does not use drugs.  Allergies:  Allergies  Allergen Reactions   5-Alpha Reductase Inhibitors    Canagliflozin Other (See Comments)    Severe yeast infections   Ibuprofen Other (See Comments)    GI   Melatonin Other (See Comments)   Metformin  And Related     GI distress   Onglyza [Saxagliptin]     headaches   Statins Other (See Comments)   Zofran  [Ondansetron  Hcl] Other (See Comments)    Itchy rash in mouth   Cefaclor Rash    Rash   Penicillins Rash   Pregabalin  Itching and Rash   Sitagliptin Rash    Medications: I have reviewed the patient's current medications. Prior to Admission:  Medications Prior to Admission  Medication Sig Dispense Refill Last Dose/Taking   dicyclomine  (BENTYL ) 10 MG capsule Take 1 capsule (  10 mg total) by mouth 3 (three) times daily as needed for spasms. (Patient taking differently: Take 10 mg by mouth daily.) 30 capsule 0 08/03/2024   gabapentin  (NEURONTIN ) 300 MG capsule Take 300 mg by mouth 3 (three) times daily.  3 08/03/2024   glipiZIDE  (GLUCOTROL  XL) 2.5 MG 24 hr tablet Take 1 tablet (2.5 mg total) by mouth daily with breakfast. 90 tablet 1 08/03/2024   hydrOXYzine (ATARAX/VISTARIL) 25 MG tablet Take 25 mg by mouth 2 (two) times daily as needed for itching or anxiety.   Past Week   levothyroxine  (SYNTHROID ) 150 MCG tablet Take 1 tablet (150 mcg total) by mouth daily before breakfast. 90 tablet 1 08/03/2024   nystatin  cream (MYCOSTATIN ) APPLY TO AFFECTED AREA  TWICE A DAY 30 g 3 08/03/2024   pantoprazole  (PROTONIX ) 40 MG tablet TAKE 1 TABLET BY MOUTH DAILY BEFORE BREAKFAST 90 tablet 0 08/03/2024   traMADol  (ULTRAM ) 50 MG tablet TAKE 1 TABLET BY MOUTH EVERY 12 HOURS AS NEEDED (Patient taking differently: Take 50 mg by mouth 2 (two) times daily.) 60 tablet 3 08/03/2024    Results for orders placed or performed during the hospital encounter of 08/04/24 (from the past 48 hours)  CBG monitoring, ED     Status: Abnormal   Collection Time: 08/04/24  7:17 AM  Result Value Ref Range   Glucose-Capillary 285 (H) 70 - 99 mg/dL    Comment: Glucose reference range applies only to samples taken after fasting for at least 8 hours.  CBC     Status: Abnormal   Collection Time: 08/04/24  7:52 AM  Result Value Ref Range   WBC 8.8 4.0 - 10.5 K/uL   RBC 5.22 (H) 3.87 - 5.11 MIL/uL   Hemoglobin 13.2 12.0 - 15.0 g/dL   HCT 57.8 63.9 - 53.9 %   MCV 80.7 80.0 - 100.0 fL   MCH 25.3 (L) 26.0 - 34.0 pg   MCHC 31.4 30.0 - 36.0 g/dL   RDW 84.0 (H) 88.4 - 84.4 %   Platelets 240 150 - 400 K/uL   nRBC 0.0 0.0 - 0.2 %    Comment: Performed at [facility], [address] [place], [place] [address]  hCG, serum, qualitative     Status: None   Collection Time: 08/04/24  7:52 AM  Result Value Ref Range   Preg, Serum NEGATIVE NEGATIVE    Comment:        THE SENSITIVITY OF THIS METHODOLOGY IS >10 mIU/mL. Performed at [facility], [address], [place], [place] [address]   Urinalysis, Routine w reflex microscopic -Urine, Clean Catch     Status: Abnormal   Collection Time: 08/04/24  8:14 AM  Result Value Ref Range   Color, Urine YELLOW YELLOW   APPearance CLEAR CLEAR   Specific Gravity, Urine 1.008 1.005 - 1.030   pH 5.0 5.0 - 8.0   Glucose, UA >=500 (A) NEGATIVE mg/dL   Hgb urine dipstick MODERATE (A) NEGATIVE   Bilirubin Urine NEGATIVE NEGATIVE   Ketones, ur NEGATIVE NEGATIVE mg/dL   Protein, ur NEGATIVE NEGATIVE mg/dL   Nitrite NEGATIVE NEGATIVE   Leukocytes,Ua  SMALL (A) NEGATIVE   RBC / HPF 11-20 0 - 5 RBC/hpf   WBC, UA 11-20 0 - 5 WBC/hpf   Bacteria, UA RARE (A) NONE SEEN   Squamous Epithelial / HPF 0-5 0 - 5 /HPF   Mucus PRESENT     Comment: Performed at [facility], [address] [place], [place] [address]  Comprehensive  metabolic panel with GFR     Status: Abnormal   Collection Time: 08/04/24  8:50 AM  Result Value Ref Range   Sodium 133 (L) 135 - 145 mmol/L   Potassium 4.5 3.5 - 5.1 mmol/L   Chloride 97 (L) 98 - 111 mmol/L   CO2 26 22 - 32 mmol/L   Glucose, Bld 309 (H) 70 - 99 mg/dL    Comment: Glucose reference range applies only to samples taken after fasting for at least 8 hours.   BUN 10 6 - 20 mg/dL   Creatinine, Ser 9.03 0.44 - 1.00 mg/dL   Calcium 9.6 8.9 - 89.6 mg/dL   Total Protein 8.0 6.5 - 8.1 g/dL   Albumin 4.1 3.5 - 5.0 g/dL   AST 24 15 - 41 U/L   ALT 15 0 - 44 U/L   Alkaline Phosphatase 98 38 - 126 U/L   Total Bilirubin 1.0 0.0 - 1.2 mg/dL   GFR, Estimated >39 >39 mL/min    Comment: (NOTE) Calculated using the CKD-EPI Creatinine Equation (2021)    Anion gap 11 5 - 15    Comment: Performed at [facility], [address] [place], [place] [address]  Lipase, blood     Status: None   Collection Time: 08/04/24  8:50 AM  Result Value Ref Range   Lipase 35 11 - 51 U/L    Comment: Performed at [facility], [address], [place], [place] [address]  Magnesium     Status: None   Collection Time: 08/04/24 11:49 AM  Result Value Ref Range   Magnesium 2.0 1.7 - 2.4 mg/dL    Comment: Performed at [facility], [address], [place], [place] [address]  Phosphorus     Status: None   Collection Time: 08/04/24 11:49 AM  Result Value Ref Range   Phosphorus 3.2 2.5 - 4.6 mg/dL    Comment: Performed at  [facility], [address], [place], [place] [address]  Protime-INR     Status: None   Collection Time: 08/04/24 11:49 AM  Result Value Ref Range   Prothrombin Time 14.8 11.4 - 15.2 seconds   INR 1.1 0.8 - 1.2     Comment: (NOTE) INR goal varies based on device and disease states. Performed at [facility], [address] [place], [place] [address]   Glucose, capillary     Status: Abnormal   Collection Time: 08/04/24 12:39 PM  Result Value Ref Range   Glucose-Capillary 257 (H) 70 - 99 mg/dL    Comment: Glucose reference range applies only to samples taken after fasting for at least 8 hours.    CT ABDOMEN PELVIS W CONTRAST Result Date: 08/04/2024 CLINICAL DATA:  Acute, non localized abdominal pain since 4 a.m. today. Associated vomiting and diarrhea. EXAM: CT ABDOMEN AND PELVIS WITH CONTRAST TECHNIQUE: Multidetector CT imaging of the abdomen and pelvis was performed using the standard protocol following bolus administration of intravenous contrast. RADIATION DOSE REDUCTION: This exam was performed according to the departmental dose-optimization program which includes automated exposure control, adjustment of the mA and/or kV according to patient size and/or use of iterative reconstruction technique. CONTRAST:  OMNIPAQUE  IOHEXOL  300 MG/ML  SOLN COMPARISON:  12/25/2022 FINDINGS: Lower chest: Stable elevated left hemidiaphragm. Normal-sized heart. Coronary artery calcifications. Hepatobiliary: No focal liver abnormality is seen. No gallstones, gallbladder wall thickening, or biliary dilatation. Pancreas: Unremarkable. No pancreatic ductal dilatation or surrounding inflammatory changes. Spleen: Normal in size without focal abnormality. Adrenals/Urinary Tract: Stable 5 mm mid to  lower left renal calculus without hydronephrosis. 8 and 5 mm right renal pelvis calculi. 4 mm mid right renal calculus and 6 mm lower pole right renal calculus without hydronephrosis. Normal-appearing adrenal glands, ureters and urinary bladder. Stomach/Bowel: Mildly diffusely dilated proximal and mid small bowel with normal caliber distal small bowel and colon. There are several points of transition to small caliber loops within  moderately large bilobed ventral hernia at the inferior aspect of the patient's panniculus. There is edema in the subcutaneous fat adjacent to the hernia. Normal-appearing appendix and stomach. Vascular/Lymphatic: Minimal atheromatous arterial calcifications without aneurysm. No enlarged lymph nodes. Reproductive: Uterus and bilateral adnexa are unremarkable. Other: Moderately large bilobed ventral hernia at the inferior aspect of the patient's pannus, as described above. This contains both herniated small bowel and colon with multiple transition points in the herniated small bowel loops. Musculoskeletal: Left femur fixation hardware. Dense calcifications or injected material in the medial aspects of the gluteus maximus muscles bilaterally, unchanged moderate lumbar and lower thoracic spine degenerative changes with mild-to-moderate scoliosis. IMPRESSION: 1. Moderately large bilobed ventral hernia at the inferior aspect of the patient's panniculus containing both herniated small bowel and colon with multiple transition points in the herniated small bowel loops. This is causing a partial small bowel obstruction with mildly diffusely dilated proximal and mid small bowel. 2. Bilateral nonobstructing renal calculi. 3. Calcific coronary artery atherosclerosis. Electronically Signed   By: Elspeth Bathe M.D.   On: 08/04/2024 10:53    ROS:  Pertinent items are noted in HPI.  Blood pressure (!) 103/58, pulse (!) 109, temperature 98.9 F (37.2 C), temperature source Oral, resp. rate 20, height 5' 1 (1.549 m), weight 136.1 kg, SpO2 98%. Physical Exam: Pleasant morbidly obese white female in no acute distress.  Currently not nauseated. Head examination reveals Down's features Patient has a very large abdomen with pannus extending inferiorly.  Bowel sounds were present.  She does have a reducible hernia.  No rigidity is noted.  CT scan images personally reviewed Assessment/Plan: Impression: Nausea and diarrhea of  unknown etiology.  CT scan findings were marginal for a mechanical bowel obstruction.  She does have a history of cholelithiasis but LFTs were within normal limits.  White blood cell count within normal limits.  Other etiologies include a flareup of her IBS, gastritis. Plan: No need for acute surgical intervention at the present time.  As patient is currently asymptomatic, will give sips of liquids.  Should her symptoms not improve, a small bowel obstruction protocol study will be ordered.  This was explained to the patient and family, who understand and agree.  Oneil Budge 08/04/2024, 1:26 PM

## 2024-08-04 NOTE — Assessment & Plan Note (Signed)
 With advance developmental mental delay -Mother POA at bedside-obtaining history, plan of care discussed -Currently stable

## 2024-08-04 NOTE — ED Notes (Signed)
Failed attempt to call report.

## 2024-08-05 DIAGNOSIS — K439 Ventral hernia without obstruction or gangrene: Secondary | ICD-10-CM | POA: Diagnosis not present

## 2024-08-05 LAB — CBC
HCT: 36.2 % (ref 36.0–46.0)
Hemoglobin: 11.2 g/dL — ABNORMAL LOW (ref 12.0–15.0)
MCH: 25.3 pg — ABNORMAL LOW (ref 26.0–34.0)
MCHC: 30.9 g/dL (ref 30.0–36.0)
MCV: 81.7 fL (ref 80.0–100.0)
Platelets: 236 K/uL (ref 150–400)
RBC: 4.43 MIL/uL (ref 3.87–5.11)
RDW: 15.9 % — ABNORMAL HIGH (ref 11.5–15.5)
WBC: 5.4 K/uL (ref 4.0–10.5)
nRBC: 0 % (ref 0.0–0.2)

## 2024-08-05 LAB — GASTROINTESTINAL PANEL BY PCR, STOOL (REPLACES STOOL CULTURE)

## 2024-08-05 LAB — GLUCOSE, CAPILLARY
Glucose-Capillary: 199 mg/dL — ABNORMAL HIGH (ref 70–99)
Glucose-Capillary: 222 mg/dL — ABNORMAL HIGH (ref 70–99)
Glucose-Capillary: 237 mg/dL — ABNORMAL HIGH (ref 70–99)

## 2024-08-05 LAB — BASIC METABOLIC PANEL WITH GFR
Anion gap: 11 (ref 5–15)
BUN: 7 mg/dL (ref 6–20)
CO2: 25 mmol/L (ref 22–32)
Calcium: 9.2 mg/dL (ref 8.9–10.3)
Chloride: 103 mmol/L (ref 98–111)
Creatinine, Ser: 0.81 mg/dL (ref 0.44–1.00)
GFR, Estimated: >60 mL/min (ref >60)
Glucose, Bld: 159 mg/dL — ABNORMAL HIGH (ref 70–99)
Potassium: 3.7 mmol/L (ref 3.5–5.1)
Sodium: 138 mmol/L (ref 135–145)

## 2024-08-05 MED ORDER — TRAMADOL HCL 50 MG PO TABS
50.0000 mg | ORAL_TABLET | Freq: Four times a day (QID) | ORAL | Status: DC | PRN
  Administered 2024-08-05: 50 mg via ORAL
  Filled 2024-08-05: qty 1

## 2024-08-05 MED ORDER — SENNOSIDES 8.8 MG/5ML PO SYRP: 5.0000 mL | ORAL_SOLUTION | Freq: Two times a day (BID) | ORAL | 0 refills | Status: AC

## 2024-08-05 MED ORDER — TRAMADOL 5 MG/ML ORAL SUSPENSION: 50.0000 mg | Freq: Four times a day (QID) | ORAL | Status: DC | PRN

## 2024-08-05 MED ORDER — PROMETHAZINE HCL 6.25 MG/5ML PO SOLN
25.0000 mg | Freq: Four times a day (QID) | ORAL | 1 refills | Status: AC | PRN
Start: 2024-08-05 — End: 2025-08-05

## 2024-08-05 MED ORDER — BISACODYL 5 MG PO TBEC
10.0000 mg | DELAYED_RELEASE_TABLET | Freq: Every day | ORAL | Status: DC
Start: 2024-08-05 — End: 2024-08-05
  Administered 2024-08-05: 10 mg via ORAL
  Filled 2024-08-05: qty 2

## 2024-08-05 NOTE — Plan of Care (Signed)
  Problem: Activity: Goal: Risk for activity intolerance will decrease Outcome: Progressing   Problem: Coping: Goal: Level of anxiety will decrease Outcome: Progressing   Problem: Elimination: Goal: Will not experience complications related to bowel motility Outcome: Progressing Goal: Will not experience complications related to urinary retention Outcome: Progressing   Problem: Pain Managment: Goal: General experience of comfort will improve and/or be controlled Outcome: Progressing

## 2024-08-05 NOTE — Progress Notes (Signed)
 Subjective: Patient has no complaints.  Sitting up in a chair.  Tolerated liquids well.  Objective: Vital signs in last 24 hours: Temp:  [98.1 F (36.7 C)-99.1 F (37.3 C)] 98.6 F (37 C) (10/04 9385) Pulse Rate:  [98-112] 109 (10/04 0614) Resp:  [18-20] 20 (10/04 0614) BP: (90-120)/(55-69) 106/56 (10/04 0614) SpO2:  [92 %-100 %] 92 % (10/04 0614) Weight:  [132.8 kg-136.1 kg] 132.8 kg (10/04 0614) Last BM Date : 08/04/24  Intake/Output from previous day: 10/03 0701 - 10/04 0700 In: 1123.9 [I.V.:1022.9; IV Piggyback:101] Out: -  Intake/Output this shift: No intake/output data recorded.  General appearance: alert, cooperative, and no distress GI: soft, non-tender; bowel sounds normal; no masses,  no organomegaly and reducible hernia, though I suspect it chronically is sticking out.  Lab Results:  Recent Labs    08/04/24 0752 08/05/24 0454  WBC 8.8 5.4  HGB 13.2 11.2*  HCT 42.1 36.2  PLT 240 236   BMET Recent Labs    08/04/24 0850  NA 133*  K 4.5  CL 97*  CO2 26  GLUCOSE 309*  BUN 10  CREATININE 0.96  CALCIUM 9.6   PT/INR Recent Labs    08/04/24 1149  LABPROT 14.8  INR 1.1    Studies/Results: CT ABDOMEN PELVIS W CONTRAST Result Date: 08/04/2024 CLINICAL DATA:  Acute, non localized abdominal pain since 4 a.m. today. Associated vomiting and diarrhea. EXAM: CT ABDOMEN AND PELVIS WITH CONTRAST TECHNIQUE: Multidetector CT imaging of the abdomen and pelvis was performed using the standard protocol following bolus administration of intravenous contrast. RADIATION DOSE REDUCTION: This exam was performed according to the departmental dose-optimization program which includes automated exposure control, adjustment of the mA and/or kV according to patient size and/or use of iterative reconstruction technique. CONTRAST:  OMNIPAQUE  IOHEXOL  300 MG/ML  SOLN COMPARISON:  12/25/2022 FINDINGS: Lower chest: Stable elevated left hemidiaphragm. Normal-sized heart. Coronary  artery calcifications. Hepatobiliary: No focal liver abnormality is seen. No gallstones, gallbladder wall thickening, or biliary dilatation. Pancreas: Unremarkable. No pancreatic ductal dilatation or surrounding inflammatory changes. Spleen: Normal in size without focal abnormality. Adrenals/Urinary Tract: Stable 5 mm mid to lower left renal calculus without hydronephrosis. 8 and 5 mm right renal pelvis calculi. 4 mm mid right renal calculus and 6 mm lower pole right renal calculus without hydronephrosis. Normal-appearing adrenal glands, ureters and urinary bladder. Stomach/Bowel: Mildly diffusely dilated proximal and mid small bowel with normal caliber distal small bowel and colon. There are several points of transition to small caliber loops within moderately large bilobed ventral hernia at the inferior aspect of the patient's panniculus. There is edema in the subcutaneous fat adjacent to the hernia. Normal-appearing appendix and stomach. Vascular/Lymphatic: Minimal atheromatous arterial calcifications without aneurysm. No enlarged lymph nodes. Reproductive: Uterus and bilateral adnexa are unremarkable. Other: Moderately large bilobed ventral hernia at the inferior aspect of the patient's pannus, as described above. This contains both herniated small bowel and colon with multiple transition points in the herniated small bowel loops. Musculoskeletal: Left femur fixation hardware. Dense calcifications or injected material in the medial aspects of the gluteus maximus muscles bilaterally, unchanged moderate lumbar and lower thoracic spine degenerative changes with mild-to-moderate scoliosis. IMPRESSION: 1. Moderately large bilobed ventral hernia at the inferior aspect of the patient's panniculus containing both herniated small bowel and colon with multiple transition points in the herniated small bowel loops. This is causing a partial small bowel obstruction with mildly diffusely dilated proximal and mid small bowel.  2. Bilateral nonobstructing renal calculi.  3. Calcific coronary artery atherosclerosis. Electronically Signed   By: Elspeth Bathe M.D.   On: 08/04/2024 10:53    Anti-infectives: Anti-infectives (From admission, onward)    None       Assessment/Plan: Impression: Nausea and diarrhea, resolved.  Doubt patient has a clinically significant small bowel obstruction.  Will advance to carb modified diet.  Patient and family would like to go home should she tolerate breakfast.  I am okay with that.  Nothing further to offer from the surgical standpoint at the present time.  LOS: 0 days    Oneil Budge 08/05/2024

## 2024-08-05 NOTE — Progress Notes (Signed)
 Patient discharged home today, transported home by mother/ legal guardian Tammy Robel. Discharge summary went over with both patient and mother, both verbalized understanding. Belongings sent home with patient.

## 2024-08-05 NOTE — Discharge Summary (Signed)
 Physician Discharge Summary   Patient: Alexandra Kelley MRN: [id number] DOB: 1981/01/14  Admit date:     08/04/2024  Discharge date: 08/05/24  Discharge Physician: Adriana DELENA Grams   PCP: Vick Lurie, FNP (Inactive)   Recommendations at discharge:    Follow-up with PCP in 1 week Follow-up with a gastroenterologist in 1-2 weeks Hold laxative with any persistent diarrhea Advance diet as tolerated Recommend increase ambulation and oral fluid intake  Discharge Diagnoses: Principal Problem:   Ventral hernia without obstruction or gangrene Active Problems:   Hypothyroidism   Upper abdominal pain   Down's syndrome   Diabetes (HCC)   Hyperglycemia   IBS (irritable bowel syndrome)   GERD (gastroesophageal reflux disease)  Resolved Problems:   SBP (spontaneous bacterial peritonitis) [facility])  Hospital Course: Alexandra Kelley is a 43 year old female with a history of developmental disorder, delay, Down syndrome DM II, hypothyroidism... Presented to ED accompanied by her mother with complaint of upper abdominal pain, nausea, vomiting.  Symptoms started yesterday evening.  1 episode of diarrhea, nonbloody.  No recent illnesses, or sick contact, denies fever or chills.  Denies having dysuria.     ED evaluation:  Blood pressure 120/62, pulse (!) 112, temperature 99.1 F (37.3 C), temperature source Oral, height 5' 1 (1.549 m), weight 136.1 kg, SpO2 99%.  LABs; sodium 133, chloride 97 glucose 285, 309, WBC 8.8, hemoglobin 13.2, UA: Leukocyte esterase, rare bacteria, WBC 11-20 CT abdomen/pelvis;  Moderately large bilobed ventral hernia at the inferior aspect of the patient's panniculus containing both herniated small bowel and colon with multiple transition points in the herniated small bowel loops. This is causing a partial small bowel obstruction with mildly diffusely dilated proximal and mid small bowel. 2. Bilateral nonobstructing renal calculi. 3. Calcific coronary artery  atherosclerosis.   * Ventral hernia without obstruction or gangrene Partial small bowel obstruction based on CT findings -Symptoms :epigastric pain associate with nausea vomiting 1 episode of diarrhea starting last night -History of ventral hernia with obstruction - N.p.o. x 12 hours, improved nausea vomiting, Status post IV fluid hydration, as needed Phenergan  and Reglan  -Tolerated p.o. laxatives  -Reporting gas and bowel movement  -General Surgery consulted, no surgical invention at this point  -Patient is tolerating p.o. clear to discharge home  - CT abdomen/pelvis;  Moderately large bilobed ventral hernia at the inferior aspect of the patient's panniculus containing both herniated small bowel and colon with multiple transition points in the herniated small bowel loops. This is causing a partial small bowel obstruction with mildly diffusely dilated proximal and mid small bowel.   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 -Mother POA at bedside-obtaining history, plan of care discussed -Currently stable  Upper abdominal pain - Due to chronic bilateral ventral hernia, and small bowel obstruction Associated with nausea vomiting - Improved  Hypothyroidism-continue home dose Synthroid    IBS (irritable bowel syndrome) - History of irritable bowel syndrome - Monitor closely and follow-up with GI  Hyperglycemia - With history of DM2 uncontrolled, continue IV fluids, SSI coverage -CBG every 4 hours  Diabetes (HCC) -As oral intake improved, resuming home diabetic meds  GERD (gastroesophageal reflux disease) - Continue p.o. PPI        Consultants: General surgery Procedures performed: None Disposition: Home Diet recommendation:  Discharge Diet Orders (From admission, onward)     Start     Ordered   08/05/24 0000  Diet - low sodium heart healthy        08/05/24 1002  Cardiac and Carb modified diet DISCHARGE MEDICATION: Allergies as of  08/05/2024       Reactions   5-alpha Reductase Inhibitors    Canagliflozin Other (See Comments)   Severe yeast infections   Ibuprofen Other (See Comments)   GI   Melatonin Other (See Comments)   Metformin  And Related    GI distress   Onglyza [saxagliptin]    headaches   Statins Other (See Comments)   Zofran  [ondansetron  Hcl] Other (See Comments)   Itchy rash in mouth   Cefaclor Rash   Rash   Penicillins Rash   Pregabalin  Itching, Rash   Sitagliptin Rash        Medication List     TAKE these medications    dicyclomine  10 MG capsule Commonly known as: BENTYL  Take 1 capsule (10 mg total) by mouth 3 (three) times daily as needed for spasms. What changed: when to take this   gabapentin  300 MG capsule Commonly known as: NEURONTIN  Take 300 mg by mouth 3 (three) times daily.   glipiZIDE  2.5 MG 24 hr tablet Commonly known as: GLUCOTROL  XL Take 1 tablet (2.5 mg total) by mouth daily with breakfast.   hydrOXYzine 25 MG tablet Commonly known as: ATARAX Take 25 mg by mouth 2 (two) times daily as needed for itching or anxiety.   levothyroxine  150 MCG tablet Commonly known as: SYNTHROID  Take 1 tablet (150 mcg total) by mouth daily before breakfast.   nystatin  cream Commonly known as: MYCOSTATIN  APPLY TO AFFECTED AREA TWICE A DAY   pantoprazole  40 MG tablet Commonly known as: PROTONIX  TAKE 1 TABLET BY MOUTH DAILY BEFORE BREAKFAST   promethazine  6.25 MG/5ML solution Commonly known as: PHENERGAN  Take 20 mLs (25 mg total) by mouth every 6 (six) hours as needed for nausea or vomiting.   sennosides 8.8 MG/5ML syrup Commonly known as: SENOKOT Take 5 mLs by mouth 2 (two) times daily.   traMADol  50 MG tablet Commonly known as: ULTRAM  TAKE 1 TABLET BY MOUTH EVERY 12 HOURS AS NEEDED What changed: when to take this        Follow-up Information     Mavis Anes, MD Follow up.   Specialty: General Surgery Why: As needed Contact information: [id number] [facility] [address] [phone]                Discharge Exam: Filed Weights   08/04/24 0709 08/05/24 0614  Weight: 136.1 kg 132.8 kg        General:  AAO x 3,  cooperative, no distress;   HEENT:  Normocephalic, PERRL, otherwise with in Normal limits   Neuro:  CNII-XII intact. , normal motor and sensation, reflexes intact   Lungs:   Clear to auscultation BL, Respirations unlabored,  No wheezes / crackles  Cardio:    S1/S2, RRR, No murmure, No Rubs or Gallops   Abdomen:  Soft, large obese abdomen,non-tender, bowel sounds active all four quadrants, no guarding or peritoneal signs.  Muscular  skeletal:  Limited exam -global generalized weaknesses - in bed, able to move all 4 extremities,   2+ pulses,  symmetric, No pitting edema  Skin:  Dry, warm to touch, negative for any Rashes,  Wounds: Please see nursing documentation          Condition at discharge: fair  The results of significant diagnostics from this hospitalization (including imaging, microbiology, ancillary and laboratory) are listed below for reference.   Imaging Studies: CT ABDOMEN PELVIS W CONTRAST Result Date: 08/04/2024 CLINICAL DATA:  Acute, non localized abdominal pain since 4 a.m. today. Associated vomiting and diarrhea. EXAM: CT ABDOMEN AND PELVIS WITH CONTRAST TECHNIQUE: Multidetector CT imaging of the abdomen and pelvis was performed using the standard protocol following bolus administration of intravenous contrast. RADIATION DOSE REDUCTION: This exam was performed according to the departmental dose-optimization program which includes automated exposure control, adjustment of the mA and/or kV according to patient size and/or use of iterative reconstruction technique. CONTRAST:  OMNIPAQUE  IOHEXOL  300 MG/ML  SOLN COMPARISON:  12/25/2022 FINDINGS: Lower chest: Stable elevated left hemidiaphragm. Normal-sized heart. Coronary artery calcifications. Hepatobiliary: No focal liver abnormality is  seen. No gallstones, gallbladder wall thickening, or biliary dilatation. Pancreas: Unremarkable. No pancreatic ductal dilatation or surrounding inflammatory changes. Spleen: Normal in size without focal abnormality. Adrenals/Urinary Tract: Stable 5 mm mid to lower left renal calculus without hydronephrosis. 8 and 5 mm right renal pelvis calculi. 4 mm mid right renal calculus and 6 mm lower pole right renal calculus without hydronephrosis. Normal-appearing adrenal glands, ureters and urinary bladder. Stomach/Bowel: Mildly diffusely dilated proximal and mid small bowel with normal caliber distal small bowel and colon. There are several points of transition to small caliber loops within moderately large bilobed ventral hernia at the inferior aspect of the patient's panniculus. There is edema in the subcutaneous fat adjacent to the hernia. Normal-appearing appendix and stomach. Vascular/Lymphatic: Minimal atheromatous arterial calcifications without aneurysm. No enlarged lymph nodes. Reproductive: Uterus and bilateral adnexa are unremarkable. Other: Moderately large bilobed ventral hernia at the inferior aspect of the patient's pannus, as described above. This contains both herniated small bowel and colon with multiple transition points in the herniated small bowel loops. Musculoskeletal: Left femur fixation hardware. Dense calcifications or injected material in the medial aspects of the gluteus maximus muscles bilaterally, unchanged moderate lumbar and lower thoracic spine degenerative changes with mild-to-moderate scoliosis. IMPRESSION: 1. Moderately large bilobed ventral hernia at the inferior aspect of the patient's panniculus containing both herniated small bowel and colon with multiple transition points in the herniated small bowel loops. This is causing a partial small bowel obstruction with mildly diffusely dilated proximal and mid small bowel. 2. Bilateral nonobstructing renal calculi. 3. Calcific coronary  artery atherosclerosis. Electronically Signed   By: Elspeth Bathe M.D.   On: 08/04/2024 10:53    Microbiology: Results for orders placed or performed in visit on 07/08/23  Microscopic Examination     Status: Abnormal   Collection Time: 07/08/23  3:24 PM   Urine  Result Value Ref Range Status   WBC, UA 6-10 (A) 0 - 5 /hpf Final   RBC, Urine 0-2 0 - 2 /hpf Final   Epithelial Cells (non renal) 0-10 0 - 10 /hpf Final   Bacteria, UA Few (A) None seen/Few Final    Labs: CBC: Recent Labs  Lab 08/04/24 0752 08/05/24 0454  WBC 8.8 5.4  HGB 13.2 11.2*  HCT 42.1 36.2  MCV 80.7 81.7  PLT 240 236   Basic Metabolic Panel: Recent Labs  Lab 08/04/24 0850 08/04/24 1149 08/05/24 0454  NA 133*  --  138  K 4.5  --  3.7  CL 97*  --  103  CO2 26  --  25  GLUCOSE 309*  --  159*  BUN 10  --  7  CREATININE 0.96  --  0.81  CALCIUM 9.6  --  9.2  MG  --  2.0  --   PHOS  --  3.2  --    Liver Function Tests:  Recent Labs  Lab 08/04/24 0850  AST 24  ALT 15  ALKPHOS 98  BILITOT 1.0  PROT 8.0  ALBUMIN 4.1   CBG: Recent Labs  Lab 08/04/24 1239 08/04/24 1653 08/04/24 2130 08/05/24 0617 08/05/24 0725  GLUCAP 257* 205* 180* 199* 222*    Discharge time spent: greater than 30 minutes.  Signed: Adriana DELENA Grams, MD [facility] 08/05/2024

## 2024-08-25 LAB — COMPREHENSIVE METABOLIC PANEL WITH GFR
ALT: 12 IU/L (ref 0–32)
AST: 20 IU/L (ref 0–40)
Albumin: 4.1 g/dL (ref 3.9–4.9)
Alkaline Phosphatase: 92 IU/L (ref 41–116)
BUN/Creatinine Ratio: 11 (ref 9–23)
BUN: 9 mg/dL (ref 6–24)
Bilirubin Total: 0.4 mg/dL (ref 0.0–1.2)
CO2: 25 mmol/L (ref 20–29)
Calcium: 9.7 mg/dL (ref 8.7–10.2)
Chloride: 99 mmol/L (ref 96–106)
Creatinine, Ser: 0.85 mg/dL (ref 0.57–1.00)
Globulin, Total: 3.2 g/dL (ref 1.5–4.5)
Glucose: 118 mg/dL — ABNORMAL HIGH (ref 70–99)
Potassium: 4.6 mmol/L (ref 3.5–5.2)
Sodium: 139 mmol/L (ref 134–144)
Total Protein: 7.3 g/dL (ref 6.0–8.5)
eGFR: 89 mL/min/1.73 (ref >59)

## 2024-08-25 LAB — VITAMIN D 25 HYDROXY (VIT D DEFICIENCY, FRACTURES): Vit D, 25-Hydroxy: 18.9 ng/mL — ABNORMAL LOW (ref 30.0–100.0)

## 2024-08-25 LAB — T4, FREE: Free T4: 1.41 ng/dL (ref 0.82–1.77)

## 2024-08-25 LAB — TSH: TSH: 0.69 u[IU]/mL (ref 0.450–4.500)

## 2024-08-31 ENCOUNTER — Encounter: Payer: Self-pay | Admitting: Nurse Practitioner

## 2024-08-31 ENCOUNTER — Ambulatory Visit: Admitting: Nurse Practitioner

## 2024-08-31 VITALS — BP 102/80 | HR 87 | Ht 62.0 in | Wt 295.2 lb

## 2024-08-31 DIAGNOSIS — E782 Mixed hyperlipidemia: Secondary | ICD-10-CM | POA: Diagnosis not present

## 2024-08-31 DIAGNOSIS — Z794 Long term (current) use of insulin: Secondary | ICD-10-CM | POA: Diagnosis not present

## 2024-08-31 DIAGNOSIS — E559 Vitamin D deficiency, unspecified: Secondary | ICD-10-CM

## 2024-08-31 DIAGNOSIS — I10 Essential (primary) hypertension: Secondary | ICD-10-CM

## 2024-08-31 DIAGNOSIS — E119 Type 2 diabetes mellitus without complications: Secondary | ICD-10-CM | POA: Diagnosis not present

## 2024-08-31 DIAGNOSIS — E039 Hypothyroidism, unspecified: Secondary | ICD-10-CM | POA: Diagnosis not present

## 2024-08-31 MED ORDER — VITAMIN D (ERGOCALCIFEROL) 1.25 MG (50000 UNIT) PO CAPS: 50000.0000 [IU] | ORAL_CAPSULE | ORAL | 3 refills | Status: AC

## 2024-08-31 MED ORDER — LEVOTHYROXINE SODIUM 150 MCG PO TABS: 150.0000 ug | ORAL_TABLET | Freq: Every day | ORAL | 1 refills | Status: AC

## 2024-08-31 MED ORDER — GLIPIZIDE ER 2.5 MG PO TB24: 2.5000 mg | ORAL_TABLET | Freq: Every day | ORAL | 1 refills | Status: AC

## 2024-08-31 NOTE — Progress Notes (Signed)
 Endocrinology Follow Up Note       08/31/2024, 3:48 PM   Subjective:    Patient ID: Alexandra Kelley, female    DOB: 1981-09-28.  Alexandra Kelley is being seen in follow up after being seen in consultation for management of currently uncontrolled symptomatic diabetes and hypothyroidism requested by  Vick Lurie, FNP (Inactive).   Past Medical History:  Diagnosis Date   Arthritis    back   Carpal tunnel syndrome    both wrists   Diabetes mellitus without complication (HCC)    Dr. Adel   GERD (gastroesophageal reflux disease)    History of kidney stones    Hyperlipidemia    Hypothyroidism    Kidney stone    Neuropathy    feet and hands   Thyroid  disease     Past Surgical History:  Procedure Laterality Date   BACK SURGERY  1990s   CYSTOSCOPY/URETEROSCOPY/HOLMIUM LASER/STENT PLACEMENT Right 06/20/2020   Procedure: CYSTOSCOPY, RIGHT RETROGRADE /STENT PLACEMENT;  Surgeon: Watt Rush, MD;  Location: [facility];  Service: Urology;  Laterality: Right;   CYSTOSCOPY/URETEROSCOPY/HOLMIUM LASER/STENT PLACEMENT Right 07/11/2020   Procedure: CYSTOSCOPY RIGHT URETEROSCOPY/HOLMIUM LASER/STENT EXCHANGE;  Surgeon: Watt Rush, MD;  Location: [facility];  Service: Urology;  Laterality: Right;   FEMUR IM NAIL Left 10/03/2013   Procedure: INTRAMEDULLARY (IM) RETROGRADE FEMORAL NAILING;  Surgeon: Oneil JAYSON Herald, MD;  Location: [facility];  Service: Orthopedics;  Laterality: Left;   tubes in ears      Social History   Socioeconomic History   Marital status: Single    Spouse name: Not on file   Number of children: Not on file   Years of education: 12   Highest education level: Not on file  Occupational History   Not on file  Tobacco Use   Smoking status: Never   Smokeless tobacco: Never  Vaping Use   Vaping status: Never Used  Substance and Sexual Activity   Alcohol use: No   Drug use: No   Sexual activity: Never     Birth control/protection: None  Other Topics Concern   Not on file  Social History Narrative   Patient lives at home with her parents Tammy and 44.   Patient has a high school certificate.   Right handed.   Caffeine coke cola sometimes.            Social Drivers of Corporate Investment Banker Strain: Not on file  Food Insecurity: No Food Insecurity (08/04/2024)   Hunger Vital Sign    Worried About Running Out of Food in the Last Year: Never true    Ran Out of Food in the Last Year: Never true  Transportation Needs: No Transportation Needs (08/04/2024)   PRAPARE - Administrator, Civil Service (Medical): No    Lack of Transportation (Non-Medical): No  Physical Activity: Not on file  Stress: Not on file  Social Connections: Patient Unable To Answer (08/04/2024)   Social Connection and Isolation Panel    Frequency of Communication with Friends and Family: Patient unable to answer    Frequency of Social Gatherings with Friends and Family: Patient unable to answer    Attends  Religious Services: Patient unable to answer    Active Member of Clubs or Organizations: Patient unable to answer    Attends Club or Organization Meetings: Patient unable to answer    Marital Status: Patient unable to answer    Family History  Problem Relation Age of Onset   Thyroid  disease Mother    Hyperlipidemia Mother    Kidney Stones Father    Thyroid  disease Maternal Grandmother    Other Maternal Grandmother        heart issue   Heart attack Maternal Grandfather    Diabetes Maternal Grandfather    Colon cancer Maternal Grandfather    Cancer Maternal Grandfather        prostate   Heart attack Paternal Grandmother     Outpatient Encounter Medications as of 08/31/2024  Medication Sig   dicyclomine  (BENTYL ) 10 MG capsule Take 1 capsule (10 mg total) by mouth 3 (three) times daily as needed for spasms. (Patient taking differently: Take 10 mg by mouth daily.)   gabapentin  (NEURONTIN )  300 MG capsule Take 300 mg by mouth 3 (three) times daily.   hydrOXYzine (ATARAX/VISTARIL) 25 MG tablet Take 25 mg by mouth 2 (two) times daily as needed for itching or anxiety.   nystatin  cream (MYCOSTATIN ) APPLY TO AFFECTED AREA TWICE A DAY   pantoprazole  (PROTONIX ) 40 MG tablet TAKE 1 TABLET BY MOUTH DAILY BEFORE BREAKFAST   promethazine  (PHENERGAN ) 6.25 MG/5ML solution Take 20 mLs (25 mg total) by mouth every 6 (six) hours as needed for nausea or vomiting.   sennosides (SENOKOT) 8.8 MG/5ML syrup Take 5 mLs by mouth 2 (two) times daily. (Patient taking differently: Take 5 mLs by mouth 2 (two) times daily. Patient is taking every other day)   traMADol  (ULTRAM ) 50 MG tablet TAKE 1 TABLET BY MOUTH EVERY 12 HOURS AS NEEDED (Patient taking differently: Take 50 mg by mouth 2 (two) times daily.)   Vitamin D , Ergocalciferol , (DRISDOL ) 1.25 MG (50000 UNIT) CAPS capsule Take 1 capsule (50,000 Units total) by mouth every 7 (seven) days.   [DISCONTINUED] glipiZIDE  (GLUCOTROL  XL) 2.5 MG 24 hr tablet Take 1 tablet (2.5 mg total) by mouth daily with breakfast.   [DISCONTINUED] levothyroxine  (SYNTHROID ) 150 MCG tablet Take 1 tablet (150 mcg total) by mouth daily before breakfast.   glipiZIDE  (GLUCOTROL  XL) 2.5 MG 24 hr tablet Take 1 tablet (2.5 mg total) by mouth daily with breakfast.   levothyroxine  (SYNTHROID ) 150 MCG tablet Take 1 tablet (150 mcg total) by mouth daily before breakfast.   No facility-administered encounter medications on file as of 08/31/2024.    ALLERGIES: Allergies  Allergen Reactions   5-Alpha Reductase Inhibitors    Canagliflozin Other (See Comments)    Severe yeast infections   Ibuprofen Other (See Comments)    GI   Melatonin Other (See Comments)   Metformin  And Related     GI distress   Onglyza [Saxagliptin]     headaches   Statins Other (See Comments)   Zofran  [Ondansetron  Hcl] Other (See Comments)    Itchy rash in mouth   Cefaclor Rash    Rash   Penicillins Rash    Pregabalin  Itching and Rash   Sitagliptin Rash    VACCINATION STATUS: There is no immunization history for the selected administration types on file for this patient.  Diabetes She presents for her follow-up diabetic visit. She has type 2 diabetes mellitus. Onset time: diagnosed at approx age of 64. Her disease course has been improving. There are no  hypoglycemic associated symptoms. Pertinent negatives for hypoglycemia include no nervousness/anxiousness or tremors. Pertinent negatives for diabetes include no fatigue and no weight loss. There are no hypoglycemic complications. Symptoms are stable. Diabetic complications include peripheral neuropathy. Risk factors for coronary artery disease include diabetes mellitus and obesity. Current diabetic treatment includes oral agent (monotherapy). She is compliant with treatment most of the time. Her weight is fluctuating minimally. She is following a generally healthy diet. When asked about meal planning, she reported none. She has not had a previous visit with a dietitian. She never participates in exercise. Her home blood glucose trend is decreasing steadily. Her overall blood glucose range is 130-140 mg/dl. (She presents today, accompanied by her mother, with her meter, no logs, showing stable, at goal glycemic profile overall.  Her most recent A1c on 10/3 was 7%, improving from last visit of 7.3%.  She went to the hospital for SBO between visit.  She denies any hypoglycemia.) An ACE inhibitor/angiotensin II receptor blocker is not being taken. She sees a podiatrist.Eye exam is current.  Thyroid  Problem Presents for follow-up visit. Patient reports no anxiety, cold intolerance, constipation, depressed mood, fatigue, hair loss, heat intolerance, leg swelling, palpitations, tremors, weight gain or weight loss. Past treatments include levothyroxine . The treatment provided moderate relief. Her past medical history is significant for obesity. Risk factors include  family history of hypothyroidism.    Review of systems  Constitutional: + stable body weight,  current Body mass index is 53.99 kg/m. , no fatigue, no subjective hyperthermia Eyes: no blurry vision, no xerophthalmia ENT: no sore throat, no nodules palpated in throat, no dysphagia/odynophagia, no hoarseness Cardiovascular: no chest pain, no shortness of breath, no palpitations, no leg swelling Respiratory: no cough, no shortness of breath Gastrointestinal: no nausea/vomiting/diarrhea Musculoskeletal: no muscle/joint aches Skin: no rashes, no hyperemia Neurological: no tremors, no numbness, no tingling, no dizziness Psychiatric: no depression, no anxiety  Objective:     BP 102/80 (BP Location: Left Arm, Patient Position: Sitting, Cuff Size: Large)   Pulse 87   Ht 5' 2 (1.575 m)   Wt 295 lb 3.2 oz (133.9 kg)   BMI 53.99 kg/m   Wt Readings from Last 3 Encounters:  08/31/24 295 lb 3.2 oz (133.9 kg)  08/05/24 292 lb 12.3 oz (132.8 kg)  04/27/24 299 lb 6.4 oz (135.8 kg)     BP Readings from Last 3 Encounters:  08/31/24 102/80  08/05/24 (!) 106/56  04/27/24 112/72      Physical Exam- Limited  Constitutional:  Body mass index is 53.99 kg/m. , not in acute distress, normal state of mind Eyes:  EOMI, no exophthalmos Musculoskeletal: no gross deformities, strength intact in all four extremities, no gross restriction of joint movements Skin:  no rashes, no hyperemia Neurological: no tremor with outstretched hands    Diabetic Foot Exam - Simple   No data filed     CMP ( most recent) CMP     Component Value Date/Time   NA 139 08/24/2024 1612   K 4.6 08/24/2024 1612   CL 99 08/24/2024 1612   CO2 25 08/24/2024 1612   GLUCOSE 118 (H) 08/24/2024 1612   GLUCOSE 159 (H) 08/05/2024 0454   BUN 9 08/24/2024 1612   CREATININE 0.85 08/24/2024 1612   CALCIUM 9.7 08/24/2024 1612   PROT 7.3 08/24/2024 1612   ALBUMIN 4.1 08/24/2024 1612   AST 20 08/24/2024 1612   ALT 12  08/24/2024 1612   ALKPHOS 92 08/24/2024 1612   BILITOT 0.4  08/24/2024 1612   GFRNONAA >60 08/05/2024 0454   GFRAA >60 07/03/2020 1438     Diabetic Labs (most recent): Lab Results  Component Value Date   HGBA1C 7.0 (H) 08/04/2024   HGBA1C 7.3 (A) 04/27/2024   HGBA1C 7.7 (A) 08/05/2023     Lipid Panel ( most recent) Lipid Panel  No results found for: CHOL, TRIG, HDL, CHOLHDL, VLDL, LDLCALC, LDLDIRECT, LABVLDL    Lab Results  Component Value Date   TSH 0.690 08/24/2024   TSH 3.110 10/07/2023   TSH 1.280 03/25/2023   TSH 0.760 01/21/2023   TSH 0.679 11/16/2022   TSH 0.578 12/25/2021   TSH 0.033 (L) 09/04/2021   TSH 1.812 10/17/2018   TSH 5.154 (H) 10/03/2013   FREET4 1.41 08/24/2024   FREET4 1.27 10/07/2023   FREET4 1.24 03/25/2023   FREET4 1.26 11/16/2022   FREET4 1.32 12/25/2021   FREET4 1.68 09/04/2021           Assessment & Plan:   1) Uncontrolled type 2 diabetes with long term insulin  therapy  She presents today, accompanied by her mother, with her meter, no logs, showing stable, at goal glycemic profile overall.  Her most recent A1c on 10/3 was 7%, improving from last visit of 7.3%.  She went to the hospital for SBO between visit.  She denies any hypoglycemia.  - Kaidynce A Bonito has currently uncontrolled symptomatic type 2 DM since 43 years of age.   -Recent labs reviewed.  - I had a long discussion with her about the progressive nature of diabetes and the pathology behind its complications. -her diabetes is complicated by peripheral neuropathy and she remains at a high risk for more acute and chronic complications which include CAD, CVA, CKD, retinopathy, and neuropathy. These are all discussed in detail with her.  - Nutritional counseling repeated/built upon at each appointment.  - The patient admits there is a room for improvement in their diet and drink choices. -  Suggestion is made for the patient to avoid simple carbohydrates  from their diet including Cakes, Sweet Desserts / Pastries, Ice Cream, Soda (diet and regular), Sweet Tea, Candies, Chips, Cookies, Sweet Pastries, Store Bought Juices, Alcohol in Excess of 1-2 drinks a day, Artificial Sweeteners, Coffee Creamer, and Sugar-free Products. This will help patient to have stable blood glucose profile and potentially avoid unintended weight gain.   - I encouraged the patient to switch to unprocessed or minimally processed complex starch and increased protein intake (animal or plant source), fruits, and vegetables.   - Patient is advised to stick to a routine mealtimes to eat 3 meals a day and avoid unnecessary snacks (to snack only to correct hypoglycemia).  - she will be scheduled with Penny Crumpton, RDN, CDE for diabetes education.  - I have approached her with the following individualized plan to manage her diabetes and patient agrees:   -Based on her stable glycemic profile, no changes will be made to her medications today.  She is advised to continue Glipizide  2.5 mg XL daily with breakfast.  -she is encouraged to continue monitoring blood glucose twice daily, before breakfast and before bed, and to call the clinic if she has readings less than 70 or above 300 for 3 tests in a row.  - she is warned not to take insulin  without proper monitoring per orders. - Adjustment parameters are given to her for hypo and hyperglycemia in writing.  - she will be considered for incretin therapy as appropriate next visit.  We have discussed this in the past but patient/mom are concerned about side effects.  - Specific targets for  A1c; LDL, HDL, and Triglycerides were discussed with the patient.  2) Blood Pressure /Hypertension:  her blood pressure is controlled to target without the use of antihypertensives.    3) Lipids/Hyperlipidemia:    Her recent lipid panel from 11/11/23 shows uncontrolled LDL of 127 and elevated triglycerides of 211.   she is advised to continue  Crestor 40 mg daily at bedtime.  Side effects and precautions discussed with her.    4)  Weight/Diet:  her Body mass index is 53.99 kg/m.  -  clearly complicating her diabetes care.   she is a candidate for weight loss. I discussed with her the fact that loss of 5 - 10% of her  current body weight will have the most impact on her diabetes management.  Exercise, and detailed carbohydrates information provided  -  detailed on discharge instructions.  5) Hypothyroidism-unspecified Her previsit TFTs are consistent with appropriate hormone replacement.   She is advised to continue Levothyroxine  150 mcg po daily before breakfast.     - The correct intake of thyroid  hormone (Levothyroxine , Synthroid ), is on empty stomach first thing in the morning, with water , separated by at least 30 minutes from breakfast and other medications,  and separated by more than 4 hours from calcium, iron, multivitamins, acid reflux medications (PPIs).  - This medication is a life-long medication and will be needed to correct thyroid  hormone imbalances for the rest of your life.  The dose may change from time to time, based on thyroid  blood work.  - It is extremely important to be consistent taking this medication, near the same time each morning.  -AVOID TAKING PRODUCTS CONTAINING BIOTIN (commonly found in Hair, Skin, Nails vitamins) AS IT INTERFERES WITH THE VALIDITY OF THYROID  FUNCTION BLOOD TESTS.  6) Vitamin D  deficiency Her recent vitamin d  level from 08/24/24 was 18.9.  She could benefit from replenishment with Ergocalciferol  50000 units po weekly.  She will likely need this long-term as she did drop significantly once stopped previously.    7) Chronic Care/Health Maintenance: -she is not on ACEI/ARB and is on Statin medications and is encouraged to initiate and continue to follow up with Ophthalmology, Dentist, Podiatrist at least yearly or according to recommendations, and advised to stay away from smoking. I have  recommended yearly flu vaccine and pneumonia vaccine at least every 5 years; moderate intensity exercise for up to 150 minutes weekly; and sleep for at least 7 hours a day.  - she is advised to maintain close follow up with [place], Tenika, FNP (Inactive) for primary care needs, as well as her other providers for optimal and coordinated care.     I spent  26  minutes in the care of the patient today including review of labs from CMP, Lipids, Thyroid  Function, Hematology (current and previous including abstractions from other facilities); face-to-face time discussing  her blood glucose readings/logs, discussing hypoglycemia and hyperglycemia episodes and symptoms, medications doses, her options of short and long term treatment based on the latest standards of care / guidelines;  discussion about incorporating lifestyle medicine;  and documenting the encounter. Risk reduction counseling performed per USPSTF guidelines to reduce obesity and cardiovascular risk factors.     Please refer to Patient Instructions for Blood Glucose Monitoring and Insulin /Medications Dosing Guide  in media tab for additional information. Please  also refer to  Patient Self Inventory in the  Media  tab for reviewed elements of pertinent patient history.  Yoona A Vanhouten participated in the discussions, expressed understanding, and voiced agreement with the above plans.  All questions were answered to her satisfaction. she is encouraged to contact clinic should she have any questions or concerns prior to her return visit.     Follow up plan: - Return in about 4 months (around 12/30/2024) for Diabetes F/U with A1c in office, Thyroid  follow up, Bring meter and logs.  Benton Rio, [facility] [address] [place], [place] [address] Phone: [phone] Fax: [phone]  08/31/2024, 3:48 PM

## 2024-08-31 NOTE — Patient Instructions (Signed)

## 2024-09-25 ENCOUNTER — Other Ambulatory Visit: Payer: Self-pay | Admitting: Adult Health

## 2024-10-10 ENCOUNTER — Ambulatory Visit (HOSPITAL_COMMUNITY)

## 2024-10-10 ENCOUNTER — Other Ambulatory Visit (HOSPITAL_COMMUNITY)

## 2024-10-12 ENCOUNTER — Ambulatory Visit: Admitting: Urology

## 2024-11-30 ENCOUNTER — Ambulatory Visit

## 2025-01-04 ENCOUNTER — Ambulatory Visit: Admitting: Nurse Practitioner

## 2025-02-06 ENCOUNTER — Ambulatory Visit

## 2025-04-18 ENCOUNTER — Ambulatory Visit: Admitting: Urology
